# Patient Record
Sex: Female | Born: 1935 | Race: Black or African American | Hispanic: No | Marital: Married | Smoking: Never smoker
Health system: Southern US, Community
[De-identification: ages and names within clinical notes are randomized; demographics above are authoritative.]

## PROBLEM LIST (undated history)

## (undated) DIAGNOSIS — I10 Essential (primary) hypertension: Secondary | ICD-10-CM

## (undated) DIAGNOSIS — I429 Cardiomyopathy, unspecified: Secondary | ICD-10-CM

## (undated) DIAGNOSIS — N184 Chronic kidney disease, stage 4 (severe): Secondary | ICD-10-CM

## (undated) DIAGNOSIS — D509 Iron deficiency anemia, unspecified: Secondary | ICD-10-CM

## (undated) DIAGNOSIS — R55 Syncope and collapse: Secondary | ICD-10-CM

## (undated) DIAGNOSIS — I509 Heart failure, unspecified: Secondary | ICD-10-CM

## (undated) DIAGNOSIS — D649 Anemia, unspecified: Secondary | ICD-10-CM

## (undated) DIAGNOSIS — I471 Supraventricular tachycardia, unspecified: Secondary | ICD-10-CM

## (undated) DIAGNOSIS — R5381 Other malaise: Secondary | ICD-10-CM

## (undated) DIAGNOSIS — I639 Cerebral infarction, unspecified: Secondary | ICD-10-CM

## (undated) DIAGNOSIS — I2510 Atherosclerotic heart disease of native coronary artery without angina pectoris: Secondary | ICD-10-CM

## (undated) DIAGNOSIS — E440 Moderate protein-calorie malnutrition: Secondary | ICD-10-CM

## (undated) HISTORY — DX: Syncope and collapse: R55

## (undated) HISTORY — PX: KNEE SURGERY: SHX244

## (undated) NOTE — *Deleted (*Deleted)
***In Progress*** PCP: Dr. Darleene Cleaver Primary Cardiologist: Dr Shirlee Latch  HPI:  Julie Stein is a 46 y.o.  female with a history of SVT, HTN, CAD, mixed ischemic/nonischemic cardiomyopathy, CHF and stroke. She was admitted initially 08/23/15 with pulmonary edema. Echo on 08/26/15 revealed an EF of 20-25% with diffuse hypokinesis. It was thought she had a viral myocarditis or hypertensive cardiomyopathy. She developed transient weakness and was noted to have CVA by MRI.  She had R/LHC on 10/10 revealing normal to low filling pressures and normal cardiac output, moderate to severe ostial OM1 and severe distal LAD stenoses with plan for medical management. We thought her flash pulmonary edema and troponin elevation was likely from hypertensive crisis leading to demand ischemia. During this time, she was noted to have runs of symptomatic SVT.   Once stable, she was admitted to IP rehab, but then readmitted to the hospital with syncope while on bedside commode requiring CPR.  She had recurrent SVT noted when back on telemetry.  Syncope thought to be 2/2 rapid SVT versus vasovagal.  She had an EP study but SVT could not be triggered so no ablation was done.  Given further recurrences, she was started on amiodarone.  Amiodarone was subsequently stopped.   Echo 1/17 showed EF improved to 60-65%.    She had a significant rise in LFTs in 5/17, apparently had a "bad cold" around this time. LFTs were normal by 7/17.  At office appointment in 2/18, she was doing poorly.  She had lost considerable weight overall. She had been intermittently confused.  She had progressive anemia and was followed by hematology who had been concerned for anemia of renal disease.  Had profound fatigue and was not very active.  She developed difficulty swallowing and pills had to be crushed.  She had to use a walker. Questionable if depression was cause of symptoms.    Later in 2/18, she was admitted with syncope.  Workup was negative.   She was started on Keppra for possible subclinical seizures.  She was admitted again in 3/18 with syncope and was found to be orthostatic.  Echo in 2/18 showed EF 60-65%.   Recently returned for followup of CHF and HTN with Dr. Shirlee Latch on 09/18/20. She was doing well symptomatically. No significant exertional dyspnea or chest pain.  Completed housework and gardens with no difficulty.  Weight had been stable.  BP was high, generally in the 140s systolic when she checks at home. No orthopnea/PND.        ECHO 09/25/20 showed LVEF 55-60% with normal RV function.  Today she returns to HF clinic for pharmacist medication titration. At last visit with MD losartan 25 mg daily was started.   150/80, 75, 101 lbs Here for HTN, should bring in BP log; Medicare A only? *Need BMET w/ losartan (Scr 1.1, K 3.7 last time) Plan A: incr losartan 50 Plan B: incr spiro 50 F/u: 4-week Pharm (DM in 6 months)  Overall feeling ***. Dizziness, lightheadedness, fatigue:  Chest pain or palpitations:  How is your breathing?: *** SOB: Able to complete all ADLs. Activity level ***  Weight at home pounds. Takes furosemide/torsemide/bumex *** mg *** daily.  LEE PND/Orthopnea  Appetite *** Low-salt diet:   Physical Exam Cost/affordability of meds   HF Medications: Carvedilol 6.25 mg BID Losartan 25 mg daily Spironolactone 25 mg daily  Has the patient been experiencing any side effects to the medications prescribed?  {YES NO:22349}  Does the patient have any problems obtaining medications  due to transportation or finances?   {YES NO:22349}  Understanding of regimen: {excellent/good/fair/poor:19665} Understanding of indications: {excellent/good/fair/poor:19665} Potential of compliance: {excellent/good/fair/poor:19665} Patient understands to avoid NSAIDs. Patient understands to avoid decongestants.    Pertinent Lab Values: . Serum creatinine ***, BUN ***, Potassium ***, Sodium ***, BNP ***, Magnesium  ***, Digoxin ***   Vital Signs: . Weight: *** (last clinic weight: 101 lbs) . Blood pressure: ***  . Heart rate: ***   Assessment: 1. Chronic Systolic HF => recovered: Mixed ischemic/nonischemic CMP, suspect long-standing HTN played a large role. ECHO 10/16 EF 20-25% with mild LVH. Repeat echo (1/17) showed EF improved back to normal range, 60-65%.  Echo in 2/18 showed that EF remained normal, 60-65%. She is euvolemic today, NYHA class II.   - Continue carvedilol 6.25 mg BID - Continue losartan 25 mg daily - Continue spironolactone 25 mg daily  2. CAD: LHC 08/27/15 with 20% proximal LAD, 40% mid LAD, 90% distal LAD, large high OM1 with 80% ostial stenosis, medically managed. LDL 79 on 09/18/20. No chest pain.  - Continue aspirin 81 mg daily - Continue rosuvastatin 10 mg daily    3. CVA: MRI head 08/28/15 showed small infarcts in watershed region.Suspect related to HTN, neurologic symptoms preceded cath.  No residual deficit.  4. H/o SVT with syncope: No symptomatic recurrence. Unable to reproduce with EP study.  - She is now off amiodarone.    5. HTN:  Renal artery dopplers 08/30/15 did not show RAS. BP elevated. - Continue carvedilol 6.25 mg BID - Continue losartan 25 mg daily - Continue spironolactone 25 mg daily   Plan: 1) Medication changes: Based on clinical presentation, vital signs and recent labs will *** 2) Labs: *** 3) Follow-up: ***   Karle Plumber, PharmD, BCPS, BCCP, CPP [facility] Pharmacist [phone]

---

## 2015-08-26 ENCOUNTER — Inpatient Hospital Stay (HOSPITAL_COMMUNITY): Payer: Medicare Other

## 2015-08-26 ENCOUNTER — Emergency Department (HOSPITAL_BASED_OUTPATIENT_CLINIC_OR_DEPARTMENT_OTHER): Payer: Medicare Other

## 2015-08-26 ENCOUNTER — Encounter (HOSPITAL_COMMUNITY): Payer: Self-pay

## 2015-08-26 ENCOUNTER — Emergency Department (HOSPITAL_COMMUNITY): Payer: Medicare Other

## 2015-08-26 ENCOUNTER — Inpatient Hospital Stay (HOSPITAL_COMMUNITY)
Admission: EM | Admit: 2015-08-26 | Discharge: 2015-08-29 | DRG: 286 | Payer: Medicare Other | Attending: Cardiology | Admitting: Cardiology

## 2015-08-26 DIAGNOSIS — N189 Chronic kidney disease, unspecified: Secondary | ICD-10-CM | POA: Diagnosis present

## 2015-08-26 DIAGNOSIS — D649 Anemia, unspecified: Secondary | ICD-10-CM | POA: Diagnosis not present

## 2015-08-26 DIAGNOSIS — Z452 Encounter for adjustment and management of vascular access device: Secondary | ICD-10-CM | POA: Insufficient documentation

## 2015-08-26 DIAGNOSIS — J9601 Acute respiratory failure with hypoxia: Secondary | ICD-10-CM

## 2015-08-26 DIAGNOSIS — B349 Viral infection, unspecified: Secondary | ICD-10-CM | POA: Diagnosis present

## 2015-08-26 DIAGNOSIS — I509 Heart failure, unspecified: Secondary | ICD-10-CM | POA: Insufficient documentation

## 2015-08-26 DIAGNOSIS — I130 Hypertensive heart and chronic kidney disease with heart failure and stage 1 through stage 4 chronic kidney disease, or unspecified chronic kidney disease: Principal | ICD-10-CM | POA: Diagnosis present

## 2015-08-26 DIAGNOSIS — J9602 Acute respiratory failure with hypercapnia: Secondary | ICD-10-CM

## 2015-08-26 DIAGNOSIS — I5023 Acute on chronic systolic (congestive) heart failure: Secondary | ICD-10-CM | POA: Diagnosis present

## 2015-08-26 DIAGNOSIS — E872 Acidosis: Secondary | ICD-10-CM | POA: Diagnosis present

## 2015-08-26 DIAGNOSIS — I493 Ventricular premature depolarization: Secondary | ICD-10-CM | POA: Diagnosis present

## 2015-08-26 DIAGNOSIS — Z7982 Long term (current) use of aspirin: Secondary | ICD-10-CM | POA: Diagnosis not present

## 2015-08-26 DIAGNOSIS — E785 Hyperlipidemia, unspecified: Secondary | ICD-10-CM | POA: Diagnosis present

## 2015-08-26 DIAGNOSIS — I959 Hypotension, unspecified: Secondary | ICD-10-CM | POA: Diagnosis not present

## 2015-08-26 DIAGNOSIS — I429 Cardiomyopathy, unspecified: Secondary | ICD-10-CM | POA: Diagnosis present

## 2015-08-26 DIAGNOSIS — J9600 Acute respiratory failure, unspecified whether with hypoxia or hypercapnia: Secondary | ICD-10-CM | POA: Diagnosis not present

## 2015-08-26 DIAGNOSIS — G459 Transient cerebral ischemic attack, unspecified: Secondary | ICD-10-CM | POA: Diagnosis not present

## 2015-08-26 DIAGNOSIS — I471 Supraventricular tachycardia, unspecified: Secondary | ICD-10-CM | POA: Insufficient documentation

## 2015-08-26 DIAGNOSIS — R531 Weakness: Secondary | ICD-10-CM

## 2015-08-26 DIAGNOSIS — I69354 Hemiplegia and hemiparesis following cerebral infarction affecting left non-dominant side: Secondary | ICD-10-CM | POA: Diagnosis not present

## 2015-08-26 DIAGNOSIS — R202 Paresthesia of skin: Secondary | ICD-10-CM | POA: Diagnosis not present

## 2015-08-26 DIAGNOSIS — J811 Chronic pulmonary edema: Secondary | ICD-10-CM

## 2015-08-26 DIAGNOSIS — N179 Acute kidney failure, unspecified: Secondary | ICD-10-CM | POA: Diagnosis present

## 2015-08-26 DIAGNOSIS — G8194 Hemiplegia, unspecified affecting left nondominant side: Secondary | ICD-10-CM | POA: Diagnosis not present

## 2015-08-26 DIAGNOSIS — I2510 Atherosclerotic heart disease of native coronary artery without angina pectoris: Secondary | ICD-10-CM | POA: Diagnosis present

## 2015-08-26 DIAGNOSIS — J810 Acute pulmonary edema: Secondary | ICD-10-CM

## 2015-08-26 DIAGNOSIS — R739 Hyperglycemia, unspecified: Secondary | ICD-10-CM | POA: Diagnosis present

## 2015-08-26 DIAGNOSIS — R0600 Dyspnea, unspecified: Secondary | ICD-10-CM

## 2015-08-26 DIAGNOSIS — R0602 Shortness of breath: Secondary | ICD-10-CM | POA: Diagnosis present

## 2015-08-26 DIAGNOSIS — I639 Cerebral infarction, unspecified: Secondary | ICD-10-CM

## 2015-08-26 DIAGNOSIS — I10 Essential (primary) hypertension: Secondary | ICD-10-CM

## 2015-08-26 DIAGNOSIS — I5021 Acute systolic (congestive) heart failure: Secondary | ICD-10-CM

## 2015-08-26 DIAGNOSIS — I161 Hypertensive emergency: Secondary | ICD-10-CM

## 2015-08-26 DIAGNOSIS — J9691 Respiratory failure, unspecified with hypoxia: Secondary | ICD-10-CM | POA: Diagnosis present

## 2015-08-26 DIAGNOSIS — I248 Other forms of acute ischemic heart disease: Secondary | ICD-10-CM | POA: Diagnosis present

## 2015-08-26 HISTORY — DX: Essential (primary) hypertension: I10

## 2015-08-26 LAB — I-STAT ARTERIAL BLOOD GAS, ED
Acid-base deficit: 6 mmol/L — ABNORMAL HIGH (ref 0.0–2.0)
Acid-base deficit: 4 mmol/L — ABNORMAL HIGH (ref 0.0–2.0)
BICARBONATE: 22.7 meq/L (ref 20.0–24.0)
Bicarbonate: 24.6 mEq/L — ABNORMAL HIGH (ref 20.0–24.0)
O2 SAT: 90 %
O2 Saturation: 99 %
PCO2 ART: 71.7 mmHg — AB (ref 35.0–45.0)
PCO2 ART: 46.2 mmHg — AB (ref 35.0–45.0)
PO2 ART: 79 mmHg — AB (ref 80.0–100.0)
TCO2: 27 mmol/L (ref 0–100)
TCO2: 24 mmol/L (ref 0–100)
pH, Arterial: 7.144 — CL (ref 7.350–7.450)
pH, Arterial: 7.299 — ABNORMAL LOW (ref 7.350–7.450)
pO2, Arterial: 156 mmHg — ABNORMAL HIGH (ref 80.0–100.0)

## 2015-08-26 LAB — I-STAT CHEM 8, ED
BUN: 21 mg/dL — AB (ref 6–20)
Calcium, Ion: 1.19 mmol/L (ref 1.13–1.30)
Chloride: 104 mmol/L (ref 101–111)
Creatinine, Ser: 1.1 mg/dL — ABNORMAL HIGH (ref 0.44–1.00)
Glucose, Bld: 371 mg/dL — ABNORMAL HIGH (ref 65–99)
HCT: 46 % (ref 36.0–46.0)
Hemoglobin: 15.6 g/dL — ABNORMAL HIGH (ref 12.0–15.0)
POTASSIUM: 3.5 mmol/L (ref 3.5–5.1)
Sodium: 139 mmol/L (ref 135–145)
TCO2: 23 mmol/L (ref 0–100)

## 2015-08-26 LAB — URINE MICROSCOPIC-ADD ON

## 2015-08-26 LAB — COMPREHENSIVE METABOLIC PANEL
ALBUMIN: 3.3 g/dL — AB (ref 3.5–5.0)
ALT: 21 U/L (ref 14–54)
ALT: 30 U/L (ref 14–54)
AST: 42 U/L — AB (ref 15–41)
AST: 59 U/L — ABNORMAL HIGH (ref 15–41)
Albumin: 3.5 g/dL (ref 3.5–5.0)
Alkaline Phosphatase: 100 U/L (ref 38–126)
Alkaline Phosphatase: 94 U/L (ref 38–126)
Anion gap: 12 (ref 5–15)
Anion gap: 14 (ref 5–15)
BILIRUBIN TOTAL: 0.2 mg/dL — AB (ref 0.3–1.2)
BUN: 16 mg/dL (ref 6–20)
BUN: 19 mg/dL (ref 6–20)
CHLORIDE: 100 mmol/L — AB (ref 101–111)
CO2: 23 mmol/L (ref 22–32)
CO2: 25 mmol/L (ref 22–32)
Calcium: 9.2 mg/dL (ref 8.9–10.3)
Calcium: 9 mg/dL (ref 8.9–10.3)
Chloride: 95 mmol/L — ABNORMAL LOW (ref 101–111)
Creatinine, Ser: 1.14 mg/dL — ABNORMAL HIGH (ref 0.44–1.00)
Creatinine, Ser: 1.11 mg/dL — ABNORMAL HIGH (ref 0.44–1.00)
GFR calc Af Amer: 52 mL/min — ABNORMAL LOW (ref >60)
GFR calc Af Amer: 54 mL/min — ABNORMAL LOW (ref >60)
GFR calc non Af Amer: 45 mL/min — ABNORMAL LOW (ref >60)
GFR calc non Af Amer: 46 mL/min — ABNORMAL LOW (ref >60)
GLUCOSE: 366 mg/dL — AB (ref 65–99)
Glucose, Bld: 196 mg/dL — ABNORMAL HIGH (ref 65–99)
POTASSIUM: 3.5 mmol/L (ref 3.5–5.1)
Potassium: 3.6 mmol/L (ref 3.5–5.1)
SODIUM: 135 mmol/L (ref 135–145)
Sodium: 134 mmol/L — ABNORMAL LOW (ref 135–145)
TOTAL PROTEIN: 7.6 g/dL (ref 6.5–8.1)
Total Bilirubin: 0.3 mg/dL (ref 0.3–1.2)
Total Protein: 7.5 g/dL (ref 6.5–8.1)

## 2015-08-26 LAB — CBC
HCT: 38.9 % (ref 36.0–46.0)
Hemoglobin: 12.4 g/dL (ref 12.0–15.0)
MCH: 23.4 pg — ABNORMAL LOW (ref 26.0–34.0)
MCHC: 31.9 g/dL (ref 30.0–36.0)
MCV: 73.5 fL — ABNORMAL LOW (ref 78.0–100.0)
Platelets: 228 10*3/uL (ref 150–400)
RBC: 5.29 MIL/uL — ABNORMAL HIGH (ref 3.87–5.11)
RDW: 15.9 % — ABNORMAL HIGH (ref 11.5–15.5)
WBC: 17.3 10*3/uL — ABNORMAL HIGH (ref 4.0–10.5)

## 2015-08-26 LAB — CBC WITH DIFFERENTIAL/PLATELET
Basophils Absolute: 0 10*3/uL (ref 0.0–0.1)
Basophils Relative: 0 %
EOS PCT: 1 %
Eosinophils Absolute: 0.1 10*3/uL (ref 0.0–0.7)
HEMATOCRIT: 40.4 % (ref 36.0–46.0)
Hemoglobin: 13 g/dL (ref 12.0–15.0)
LYMPHS ABS: 2.9 10*3/uL (ref 0.7–4.0)
LYMPHS PCT: 26 %
MCH: 23.6 pg — AB (ref 26.0–34.0)
MCHC: 32.2 g/dL (ref 30.0–36.0)
MCV: 73.2 fL — AB (ref 78.0–100.0)
MONO ABS: 0.3 10*3/uL (ref 0.1–1.0)
MONOS PCT: 3 %
NEUTROS ABS: 7.7 10*3/uL (ref 1.7–7.7)
Neutrophils Relative %: 69 %
PLATELETS: 291 10*3/uL (ref 150–400)
RBC: 5.52 MIL/uL — ABNORMAL HIGH (ref 3.87–5.11)
RDW: 16 % — AB (ref 11.5–15.5)
WBC: 11.1 10*3/uL — ABNORMAL HIGH (ref 4.0–10.5)

## 2015-08-26 LAB — TROPONIN I
TROPONIN I: 0.27 ng/mL — AB (ref <0.031)
Troponin I: 0.09 ng/mL — ABNORMAL HIGH (ref <0.031)
Troponin I: 0.31 ng/mL — ABNORMAL HIGH (ref <0.031)

## 2015-08-26 LAB — MAGNESIUM: Magnesium: 1.8 mg/dL (ref 1.7–2.4)

## 2015-08-26 LAB — PHOSPHORUS: Phosphorus: 3.9 mg/dL (ref 2.5–4.6)

## 2015-08-26 LAB — URINALYSIS, ROUTINE W REFLEX MICROSCOPIC
Bilirubin Urine: NEGATIVE
KETONES UR: NEGATIVE mg/dL
LEUKOCYTES UA: NEGATIVE
NITRITE: NEGATIVE
PH: 6 (ref 5.0–8.0)
PROTEIN: 100 mg/dL — AB
Specific Gravity, Urine: >1.03 — ABNORMAL HIGH (ref 1.005–1.030)
Urobilinogen, UA: 0.2 mg/dL (ref 0.0–1.0)

## 2015-08-26 LAB — BRAIN NATRIURETIC PEPTIDE: B Natriuretic Peptide: 451.8 pg/mL — ABNORMAL HIGH (ref 0.0–100.0)

## 2015-08-26 MED ORDER — NITROGLYCERIN IN D5W 200-5 MCG/ML-% IV SOLN
INTRAVENOUS | Status: AC
  Filled 2015-08-26: qty 250

## 2015-08-26 MED ORDER — FUROSEMIDE 10 MG/ML IJ SOLN
80.0000 mg | Freq: Once | INTRAMUSCULAR | Status: AC
  Administered 2015-08-26: 80 mg via INTRAVENOUS
  Filled 2015-08-26: qty 8

## 2015-08-26 MED ORDER — AMIODARONE HCL IN DEXTROSE 360-4.14 MG/200ML-% IV SOLN
30.0000 mg/h | INTRAVENOUS | Status: DC
  Administered 2015-08-26: 30 mg/h via INTRAVENOUS
  Filled 2015-08-26 (×2): qty 200

## 2015-08-26 MED ORDER — SODIUM CHLORIDE 0.9 % IV SOLN: INTRAVENOUS | Status: DC | PRN

## 2015-08-26 MED ORDER — ASPIRIN 81 MG PO CHEW: 324.0000 mg | CHEWABLE_TABLET | Freq: Once | ORAL | Status: DC

## 2015-08-26 MED ORDER — AMIODARONE HCL IN DEXTROSE 360-4.14 MG/200ML-% IV SOLN
60.0000 mg/h | INTRAVENOUS | Status: DC
  Administered 2015-08-26 (×2): 60 mg/h via INTRAVENOUS
  Filled 2015-08-26: qty 200

## 2015-08-26 MED ORDER — HEPARIN (PORCINE) IN NACL 100-0.45 UNIT/ML-% IJ SOLN
500.0000 [IU]/h | INTRAMUSCULAR | Status: DC
  Administered 2015-08-27: 500 [IU]/h via INTRAVENOUS
  Filled 2015-08-26: qty 250

## 2015-08-26 MED ORDER — HEPARIN BOLUS VIA INFUSION
3000.0000 [IU] | Freq: Once | INTRAVENOUS | Status: DC
  Filled 2015-08-26: qty 3000

## 2015-08-26 MED ORDER — DEXTROSE 5 % IV SOLN
1.0000 g | Freq: Once | INTRAVENOUS | Status: AC
  Administered 2015-08-26: 1 g via INTRAVENOUS
  Filled 2015-08-26: qty 10

## 2015-08-26 MED ORDER — DEXTROSE 5 % IV SOLN
250.0000 mg | Freq: Once | INTRAVENOUS | Status: AC
  Administered 2015-08-26: 250 mg via INTRAVENOUS
  Filled 2015-08-26: qty 250

## 2015-08-26 MED ORDER — NITROPRUSSIDE SODIUM 25 MG/ML IV SOLN
0.0000 ug/kg/min | INTRAVENOUS | Status: DC
  Administered 2015-08-26: 0.3 ug/kg/min via INTRAVENOUS
  Filled 2015-08-26: qty 2

## 2015-08-26 MED ORDER — AMIODARONE LOAD VIA INFUSION
150.0000 mg | Freq: Once | INTRAVENOUS | Status: AC
Start: 2015-08-26 — End: 2015-08-26
  Administered 2015-08-26: 150 mg via INTRAVENOUS
  Filled 2015-08-26: qty 83.34

## 2015-08-26 MED ORDER — ASPIRIN EC 81 MG PO TBEC
81.0000 mg | DELAYED_RELEASE_TABLET | Freq: Every day | ORAL | Status: DC
  Administered 2015-08-27 – 2015-08-28 (×2): 81 mg via ORAL
  Filled 2015-08-26 (×2): qty 1

## 2015-08-26 MED ORDER — INSULIN ASPART 100 UNIT/ML ~~LOC~~ SOLN
0.0000 [IU] | SUBCUTANEOUS | Status: DC
  Administered 2015-08-26: 3 [IU] via SUBCUTANEOUS
  Administered 2015-08-27: 2 [IU] via SUBCUTANEOUS
  Administered 2015-08-27: 1 [IU] via SUBCUTANEOUS
  Administered 2015-08-28 – 2015-08-29 (×5): 2 [IU] via SUBCUTANEOUS

## 2015-08-26 MED ORDER — SODIUM CHLORIDE 0.9 % IJ SOLN
3.0000 mL | Freq: Two times a day (BID) | INTRAMUSCULAR | Status: DC
  Administered 2015-08-26 – 2015-08-27 (×4): 3 mL via INTRAVENOUS
  Administered 2015-08-28: 10:00:00 via INTRAVENOUS
  Administered 2015-08-28 – 2015-08-29 (×2): 3 mL via INTRAVENOUS

## 2015-08-26 MED ORDER — NITROGLYCERIN IN D5W 200-5 MCG/ML-% IV SOLN
0.0000 ug/min | Freq: Once | INTRAVENOUS | Status: AC
  Administered 2015-08-26: 10 ug/min via INTRAVENOUS

## 2015-08-26 MED ORDER — FUROSEMIDE 10 MG/ML IJ SOLN
40.0000 mg | Freq: Four times a day (QID) | INTRAMUSCULAR | Status: DC
  Administered 2015-08-26 – 2015-08-27 (×2): 40 mg via INTRAVENOUS
  Filled 2015-08-26 (×2): qty 4

## 2015-08-26 NOTE — ED Notes (Addendum)
Heparin drip ordered per Dr. Clayborne Dana. Start at 10 mcg/min, increase by 25 mcg/min q 5 min.

## 2015-08-26 NOTE — Consult Note (Signed)
PULMONARY / CRITICAL CARE MEDICINE   Name: Julie Stein MRN: [id number] DOB: [birth year]    ADMISSION DATE:  08/26/2015 CONSULTATION DATE:  10/9  REFERRING MD :  Delton See (Cardiology)   CHIEF COMPLAINT:  Respiratory failure   INITIAL PRESENTATION: 80yo female never smoker with hx HTN who presented 10/9 with sudden onset SOB after 1 week of viral s/s including cough, congestion, diarrhea.  In ER CXR revealed significant pulmonary edema and bedside echo showed EF 20-25% with diffuse hypokinesis.  Pt placed on bipap in ER and admitted to ICU per cards, PCCM consulted.   STUDIES:  2D echo 10/9>>> severely reduced systolic function with EF 20-25%, diffuse hypokinesis, grade 1 diastolic dysfunction, small organized pericardial effusion  SIGNIFICANT EVENTS:    HISTORY OF PRESENT ILLNESS:  80yo female with hx HTN who presented 10/9 with sudden onset SOB after 1 week of viral s/s including cough, congestion, diarrhea.  In ER CXR revealed significant pulmonary edema and bedside echo showed EF 20-25% with diffuse hypokinesis.  Pt placed on bipap in ER and admitted to ICU per cards, PCCM consulted.   Currently feeling much better on bipap.  Still mildly tachypneic but resting, answers questions appropriately.  Per pt she had approx 1 week of viral illness including cough, congestion, malaise and 1 episode of chills but no documented fever.  Denies having any chest pain until today during her acute SOB.  Denies any associated diaphoresis during this "episode".  Currently denies chest pain, BLE edema, orthopnea, hemoptysis.    PAST MEDICAL HISTORY :   has a past medical history of Hypertension.  has no past surgical history on file. Prior to Admission medications   Not on File   No Known Allergies  FAMILY HISTORY:  has no family status information on file.  SOCIAL HISTORY:  reports that she has never smoked. She does not have any smokeless tobacco history on file. She reports that she does not drink  alcohol or use illicit drugs.  REVIEW OF SYSTEMS:  As per HPI - All other systems reviewed and were neg.    SUBJECTIVE:   VITAL SIGNS: Temp:  [97.5 F (36.4 C)-99.7 F (37.6 C)] 97.5 F (36.4 C) (10/09 1900) Pulse Rate:  [42-200] 99 (10/09 1730) Resp:  [18-39] 29 (10/09 1730) BP: (103-209)/(72-127) 146/97 mmHg (10/09 1730) SpO2:  [85 %-100 %] 100 % (10/09 1730) FiO2 (%):  [100 %] 100 % (10/09 1655) Weight:  [109 lb 5.6 oz (49.6 kg)] 109 lb 5.6 oz (49.6 kg) (10/09 1826) HEMODYNAMICS:   VENTILATOR SETTINGS: Vent Mode:  [-]  FiO2 (%):  [100 %] 100 % INTAKE / OUTPUT:  Intake/Output Summary (Last 24 hours) at 08/26/15 2008 Last data filed at 08/26/15 1839  Gross per 24 hour  Intake    255 ml  Output    300 ml  Net    -45 ml    PHYSICAL EXAMINATION: General:  Thin female, NAD on bipap  Neuro:  Awake, alert, appropriate, MAE  HEENT:  Mm dry on bipap Cardiovascular:  s1s2 NSR frequent PAC's  Lungs:  resps even, non labored, tachypneic on bipap, bilat crackles  Abdomen:  Soft, non tender, +bs  Musculoskeletal:  Warm and dry, scant BLE edema   LABS:  CBC  Recent Labs Lab 08/26/15 1306 08/26/15 1328  WBC 11.1*  --   HGB 13.0 15.6*  HCT 40.4 46.0  PLT 291  --    Coag's No results for input(s): APTT, INR in the last 168 hours.  BMET  Recent Labs Lab 08/26/15 1306 08/26/15 1328  NA 135 139  K 3.5 3.5  CL 100* 104  CO2 23  --   BUN 16 21*  CREATININE 1.14* 1.10*  GLUCOSE 366* 371*   Electrolytes  Recent Labs Lab 08/26/15 1306  CALCIUM 9.2   Sepsis Markers No results for input(s): LATICACIDVEN, PROCALCITON, O2SATVEN in the last 168 hours. ABG  Recent Labs Lab 08/26/15 1325 08/26/15 1442  PHART 7.144* 7.299*  PCO2ART 71.7* 46.2*  PO2ART 79.0* 156.0*   Liver Enzymes  Recent Labs Lab 08/26/15 1306  AST 42*  ALT 21  ALKPHOS 100  BILITOT 0.2*  ALBUMIN 3.3*   Cardiac Enzymes  Recent Labs Lab 08/26/15 1306 08/26/15 1850  TROPONINI  0.09* 0.27*   Glucose No results for input(s): GLUCAP in the last 168 hours.  Imaging Dg Chest Portable 1 View  08/26/2015   CLINICAL DATA:  Shortness of breath.  EXAM: PORTABLE CHEST 1 VIEW  COMPARISON:  None.  FINDINGS: Borderline cardiomegaly is noted. No pneumothorax or significant pleural effusion is noted. Diffuse patchy airspace opacities are noted throughout both lungs concerning for pneumonia or edema. Bony thorax is unremarkable.  IMPRESSION: Diffuse patchy bilateral airspace opacities are noted concerning for pneumonia or edema.   Electronically Signed   By: Lupita Raider, M.D.   On: 08/26/2015 13:24     ASSESSMENT / PLAN:  PULMONARY Acute respiratory failure - r/t pulmonary edema in setting acute systolic heart failure  Respiratory acidosis  P:   Continue bipap for now Titrate FiO2 to keep sats >92% F/u ABG  F/u CXR  Diuresis as below    CARDIOVASCULAR CVL  Acute systolic heart failure - unclear etiology.  ?? Viral myocarditis v decompensated Hypertensive cardiomyopathy v ischemic cardiomyopathy (less likely) HTN urgency  Pulmonary edema  P:  Diuresis per cards  Nitro gtt, amio gtt per cards  Trend troponin  F/u lactate  F/u BNP Place CVL minimally for IV access and CVP.  Cards consider PA cath    RENAL AKI - mild v CKD (no previous labs)  P:   F/u chem    GASTROINTESTINAL No active issue  P:   NPO while on bipap   HEMATOLOGIC No active issue  P:  F/u CBC   INFECTIOUS Suspect viral illness  Leukocytosis  P:   U/a  10/9>>>  Rocephin 10/9 x1  Azithro 10/9 x1  Monitor wbc, fever curve off abx    ENDOCRINE Hyperglycemia - no documented hx DM   P:   SSI  HgbA1c  Check TSH   NEUROLOGIC No active issue  P:   Monitor mental status with hypercarbia    FAMILY  - Updates:  Pt updated at length 10/9    Dirk Dress, NP 08/26/2015  8:08 PM Pager: [phone] or [phone]

## 2015-08-26 NOTE — Progress Notes (Addendum)
ANTICOAGULATION CONSULT NOTE - Initial Consult  Pharmacy Consult for Heparin Indication: chest pain/ACS  No Known Allergies  Patient Measurements: Height: 4\' 11"  (149.9 cm) Weight: 109 lb 5.6 oz (49.6 kg) IBW/kg (Calculated) : 43.2  Vital Signs: Temp: 97.5 F (36.4 C) (10/09 1900) Temp Source: Axillary (10/09 1900) BP: 144/112 mmHg (10/09 2130) Pulse Rate: 54 (10/09 2130)  Labs:  Recent Labs  08/26/15 1306 08/26/15 1328 08/26/15 1850 08/26/15 1950  HGB 13.0 15.6*  --  12.4  HCT 40.4 46.0  --  38.9  PLT 291  --   --  228  CREATININE 1.14* 1.10*  --  1.11*  TROPONINI 0.09*  --  0.27* 0.31*    Estimated Creatinine Clearance: 28.5 mL/min (by C-G formula based on Cr of 1.11).   Medical History: Past Medical History  Diagnosis Date  . Hypertension     Medications:  No prescriptions prior to admission    Assessment: 56 YOF who presented with sudden onset of SOB. Repeat troponins are elevated. Pharmacy consulted to start IV heparin for ACS. H/H and Plt wnl. Likely cath tomorrow. Per RN, patient is not on any blood thinners at home.   Goal of Therapy:  Heparin level 0.3-0.7 units/ml Monitor platelets by anticoagulation protocol: Yes   Plan:  -Give heparin 3000 units IV bolus followed heparin infusion at 500 units/hr -F/u 8 hr HL -Monitor daily HL, CBC and s/s of bleeding   Vinnie Level, PharmD., BCPS Clinical Pharmacist Pager [phone]   ADDENDUM: Pt went unresponsive overnight then had left-sided deficits; neuro consulted, asked to hold heparin until CT results, CT shows no acute process.  Spoke w/ RN Marchelle Folks who will hold bolus 2/2 possible CVA, okay to start at rate previously ordered if approved by neuro but will lower goal to 0.3-0.5 until CVA ruled out. Vernard Gambles, PharmD, BCPS 08/27/2015 2:54 AM

## 2015-08-26 NOTE — Progress Notes (Signed)
  Echocardiogram 2D Echocardiogram has been performed.  Delcie Roch 08/26/2015, 4:50 PM

## 2015-08-26 NOTE — ED Notes (Signed)
Pt called nurse to room, reports since being on 100% non rebreather, chest pain has returned and her breathing is more labored.

## 2015-08-26 NOTE — ED Notes (Signed)
Echo tech done.

## 2015-08-26 NOTE — Procedures (Signed)
Central Venous Catheter Insertion Procedure Note Julie Stein [id number] 01/27/1935  Procedure: Insertion of Central Venous Catheter Indications: Assessment of intravascular volume and Drug and/or fluid administration  Procedure Details Consent: Risks of procedure as well as the alternatives and risks of each were explained to the (patient/caregiver).  Consent for procedure obtained. Time Out: Verified patient identification, verified procedure, site/side was marked, verified correct patient position, special equipment/implants available, medications/allergies/relevent history reviewed, required imaging and test results available.  Performed  Maximum sterile technique was used including antiseptics, cap, gloves, gown, hand hygiene, mask and sheet. Skin prep: Chlorhexidine; local anesthetic administered A antimicrobial bonded/coated triple lumen catheter was placed in the left internal jugular vein using the Seldinger technique.  Evaluation Blood flow good Complications: No apparent complications Patient did tolerate procedure well. Chest X-ray ordered to verify placement.  CXR: pending.   Performed using ultrasound guidance.  Wire visualized in vessel under ultrasound.   Dirk Dress, NP 08/26/2015  10:54 PM

## 2015-08-26 NOTE — H&P (Addendum)
CARDIOLOGY CONSULT NOTE   Patient ID: Julie Stein MRN: [id number], DOB/AGE: 05-27-1935   Admit date: 08/26/2015 Date of Consult: 08/26/2015  Primary Physician: No PCP Per Patient Primary Cardiologist: New patient  Reason for consult:  SOB, tachycardia, CHF  Problem List  Past Medical History  Diagnosis Date  . Hypertension     History reviewed. No pertinent past surgical history.   Allergies  No Known Allergies  HPI   A pleasant 80 year old female with h/o hypertension who presented with sudden onset of shortness of breath this am. The patient is on BiPAP mask during the interview but nodes and the questions are answered by her daughter and other family members. The patient was experiencing viral disease in the last week with mild diarrhea, congestion, and cough. She woke up this morning and was doing house chores when all sudden developed cough that progressed to severe shortness of breath. She presented to the ER and was started on BiPAP for acute respiratory failure, ABGs shows respiratory acidosis with hypercapnia.  CXR shows significant pulmonary edema.  She had mild chest pain during the episode. While in the ER she was running frequent PVCs, bigeminy, PACs and 2 runs of SVT with HR up to 200 BPM. She became hypotensive during these episodes and was started on amiodarone drip.   She appears to be in respiratory distress while on BiPAP. Bedside echo shows LVEF 20-25%, full echo is pending, RV is functioning well.   Inpatient Medications  . furosemide  40 mg Intravenous Q6H  . furosemide  80 mg Intravenous Once  . nitroGLYCERIN        Family History History reviewed. No pertinent family history.   Social History Social History   Social History  . Marital Status: Married    Spouse Name: N/A  . Number of Children: N/A  . Years of Education: N/A   Occupational History  . Not on file.   Social History Main Topics  . Smoking status: Never Smoker   . Smokeless  tobacco: Not on file  . Alcohol Use: No  . Drug Use: No  . Sexual Activity: Not on file   Other Topics Concern  . Not on file   Social History Narrative  . No narrative on file     Review of Systems  General:  No chills, fever, night sweats or weight changes.  Cardiovascular:  No chest pain, dyspnea on exertion, edema, orthopnea, palpitations, paroxysmal nocturnal dyspnea. Dermatological: No rash, lesions/masses Respiratory: No cough, dyspnea Urologic: No hematuria, dysuria Abdominal:   No nausea, vomiting, diarrhea, bright red blood per rectum, melena, or hematemesis Neurologic:  No visual changes, wkns, changes in mental status. All other systems reviewed and are otherwise negative except as noted above.  Physical Exam  Blood pressure 139/89, pulse 96, temperature 99.7 F (37.6 C), temperature source Rectal, resp. rate 25, SpO2 98 %.  General: on BiPAP, in mild respiratory distress  Psych: Normal affect. Neuro: Alert and oriented X 3. Moves all extremities spontaneously. Neck: Supple without bruits, elevated JVDs. Lungs:  Resp regular and unlabored, crackles B/L. Heart: RRR no s3, s4, or murmurs. Abdomen: Soft, non-tender, non-distended, BS + x 4.  Extremities: No clubbing, cyanosis, mild B/L LE edema. DP/PT/Radials 2+ and equal bilaterally.  Labs   Recent Labs  08/26/15 1306  TROPONINI 0.09*   Lab Results  Component Value Date   WBC 11.1* 08/26/2015   HGB 15.6* 08/26/2015   HCT 46.0 08/26/2015   MCV 73.2* 08/26/2015  PLT 291 08/26/2015     Recent Labs Lab 08/26/15 1306 08/26/15 1328  NA 135 139  K 3.5 3.5  CL 100* 104  CO2 23  --   BUN 16 21*  CREATININE 1.14* 1.10*  CALCIUM 9.2  --   PROT 7.6  --   BILITOT 0.2*  --   ALKPHOS 100  --   ALT 21  --   AST 42*  --   GLUCOSE 366* 371*   Radiology/Studies  Dg Chest Portable 1 View  08/26/2015   CLINICAL DATA:  Shortness of breath.  EXAM: PORTABLE CHEST 1 VIEW  COMPARISON:  None.  FINDINGS:  Borderline cardiomegaly is noted. No pneumothorax or significant pleural effusion is noted. Diffuse patchy airspace opacities are noted throughout both lungs concerning for pneumonia or edema. Bony thorax is unremarkable.  IMPRESSION: Diffuse patchy bilateral airspace opacities are noted concerning for pneumonia or edema.   Electronically Signed   By: Lupita Raider, M.D.   On: 08/26/2015 13:24   Echocardiogram - 08/26/2015 - Left ventricle: The cavity size was normal. There was mild concentric hypertrophy. Systolic function was severely reduced. The estimated ejection fraction was in the range of 20% to 25%. Diffuse hypokinesis. Doppler parameters are consistent with abnormal left ventricular relaxation (grade 1 diastolic dysfunction). The E/e&' ratio is >15, suggesting elevated LV filling pressure. Doppler parameters are consistent with elevated ventricular end-diastolic filling pressure. - Aortic valve: Mildly calcified leaflets. There was no stenosis. There was no regurgitation. - Aortic root: The aortic root was normal in size. - Ascending aorta: The ascending aorta was normal in size. - Mitral valve: There was mild regurgitation. - Right atrium: The atrium was normal in size. - Atrial septum: Bows from left to right, suggesting high LA pressure. There may be a small PFO by color doppler. - Tricuspid valve: There was trivial regurgitation. - Pulmonary arteries: The main pulmonary artery was normal-sized. - Inferior vena cava: The vessel was normal in size. The respirophasic diameter changes were in the normal range (= 50%), consistent with normal central venous pressure. - Pericardium, extracardiac: Small anteriorly located partially organized pericardial effusion which is heterogeneous appearing, could represent loculation or hemopericardium. Features were not consistent with tamponade physiology.  Impressions: - Left ventricle has normal size with mild  concentric LVH and severely decreased systolic function, LVEF 20-25%. There is diffuse hypokinesis.  The fact that there is only grade I diastolic dysfunction with normal left atrial size and LV has normal size is suggestive of an acute process rather than a chronic condition.  ECG: SR, frequent PACs and PVCs, LVH, diffuse ST depressions and inverted T waves    ASSESSMENT AND PLAN  80 year old female with acute respiratory failure sec to acute systolic CHF. Left ventricle has normal size with mild concentric LVH and severely decreased systolic function, LVEF 20-25%. There is diffuse hypokinesis. She is significantly fluid overloaded. The differential includes acute myocarditis, possible giant cell, decompensated hypertensive cardiomyopathy or ischemic cardiomyopathy (lower on differential).  She is in mild respiratory distress in hypercapnic respiratory failure, followed by PPCM. She might require intubation. I will diurese aggressively, give 80 mg iv Lasix x 1, then 40 mg iv every 6 hours. We will continue NTG drip to control BP.   PPCM will place a Swan, we will decide on further management based on results.   The first troponin is 0.09, if continues to rise, we will consider a cath.  We will start Heparin drip after the  central line is placed.   Signed, Lars Masson, MD, [facility] 08/26/2015, 5:07 PM

## 2015-08-26 NOTE — ED Provider Notes (Signed)
CSN: [id number]     Arrival date & time 08/26/15  1238 History   First MD Initiated Contact with Patient 08/26/15 1254     Chief Complaint  Patient presents with  . Shortness of Breath     (Consider location/radiation/quality/duration/timing/severity/associated sxs/prior Treatment) Patient is a 80 y.o. female presenting with shortness of breath. The history is provided by a relative and the EMS personnel.  Shortness of Breath Severity:  Severe Onset quality:  Sudden Duration:  4 hours Timing:  Constant Progression:  Worsening Chronicity:  New Context: not activity, not pollens and not URI   Relieved by:  None tried Worsened by:  Nothing tried Ineffective treatments:  None tried Associated symptoms: chest pain (yesterday)     Past Medical History  Diagnosis Date  . Hypertension    History reviewed. No pertinent past surgical history. History reviewed. No pertinent family history. Social History  Substance Use Topics  . Smoking status: Never Smoker   . Smokeless tobacco: None  . Alcohol Use: No   OB History    No data available     Review of Systems  Unable to perform ROS: Acuity of condition  Respiratory: Positive for shortness of breath.   Cardiovascular: Positive for chest pain (yesterday).      Allergies  Review of patient's allergies indicates no known allergies.  Home Medications   Prior to Admission medications   Not on File   BP 103/72 mmHg  Pulse 200  Temp(Src) 99.7 F (37.6 C) (Rectal)  Resp 34  SpO2 96% Physical Exam  Constitutional: She is oriented to person, place, and time. She appears well-developed and well-nourished. She appears distressed.  HENT:  Head: Normocephalic and atraumatic.  Eyes: Conjunctivae and EOM are normal. Right eye exhibits no discharge. Left eye exhibits no discharge.  Cardiovascular: An irregular rhythm present. Tachycardia present.   Pulmonary/Chest: Accessory muscle usage present. Tachypnea noted. She is in  respiratory distress. She has decreased breath sounds. She has wheezes. She has rales.  Abdominal: Soft. She exhibits no distension. There is no tenderness. There is no rebound.  Musculoskeletal: Normal range of motion. She exhibits no edema or tenderness.  Neurological: She is alert and oriented to person, place, and time.  Skin: Skin is warm. She is diaphoretic.  Nursing note and vitals reviewed.   ED Course  Procedures (including critical care time)  CRITICAL CARE Performed by: Marily Memos   Total critical care time: 70 minutes  Critical care time was exclusive of separately billable procedures and treating other patients.  Critical care was necessary to treat or prevent imminent or life-threatening deterioration.  Critical care was time spent personally by me on the following activities: development of treatment plan with patient and/or surrogate as well as nursing, discussions with consultants, evaluation of patient's response to treatment, examination of patient, obtaining history from patient or surrogate, ordering and performing treatments and interventions, ordering and review of laboratory studies, ordering and review of radiographic studies, pulse oximetry and re-evaluation of patient's condition.   Labs Review Labs Reviewed  COMPREHENSIVE METABOLIC PANEL - Abnormal; Notable for the following:    Chloride 100 (*)    Glucose, Bld 366 (*)    Creatinine, Ser 1.14 (*)    Albumin 3.3 (*)    AST 42 (*)    Total Bilirubin 0.2 (*)    GFR calc non Af Amer 45 (*)    GFR calc Af Amer 52 (*)    All other components within normal limits  CBC WITH DIFFERENTIAL/PLATELET - Abnormal; Notable for the following:    WBC 11.1 (*)    RBC 5.52 (*)    MCV 73.2 (*)    MCH 23.6 (*)    RDW 16.0 (*)    All other components within normal limits  TROPONIN I - Abnormal; Notable for the following:    Troponin I 0.09 (*)    All other components within normal limits  BRAIN NATRIURETIC  PEPTIDE - Abnormal; Notable for the following:    B Natriuretic Peptide 451.8 (*)    All other components within normal limits  I-STAT CHEM 8, ED - Abnormal; Notable for the following:    BUN 21 (*)    Creatinine, Ser 1.10 (*)    Glucose, Bld 371 (*)    Hemoglobin 15.6 (*)    All other components within normal limits  I-STAT ARTERIAL BLOOD GAS, ED - Abnormal; Notable for the following:    pH, Arterial 7.144 (*)    pCO2 arterial 71.7 (*)    pO2, Arterial 79.0 (*)    Bicarbonate 24.6 (*)    Acid-base deficit 6.0 (*)    All other components within normal limits  I-STAT ARTERIAL BLOOD GAS, ED - Abnormal; Notable for the following:    pH, Arterial 7.299 (*)    pCO2 arterial 46.2 (*)    pO2, Arterial 156.0 (*)    Acid-base deficit 4.0 (*)    All other components within normal limits  URINALYSIS, ROUTINE W REFLEX MICROSCOPIC (NOT AT [facility])  BLOOD GAS, ARTERIAL    Imaging Review Dg Chest Portable 1 View  08/26/2015   CLINICAL DATA:  Shortness of breath.  EXAM: PORTABLE CHEST 1 VIEW  COMPARISON:  None.  FINDINGS: Borderline cardiomegaly is noted. No pneumothorax or significant pleural effusion is noted. Diffuse patchy airspace opacities are noted throughout both lungs concerning for pneumonia or edema. Bony thorax is unremarkable.  IMPRESSION: Diffuse patchy bilateral airspace opacities are noted concerning for pneumonia or edema.   Electronically Signed   By: Lupita Raider, M.D.   On: 08/26/2015 13:24   I have personally reviewed and evaluated these images and lab results as part of my medical decision-making.   EKG Interpretation   Date/Time:  Sunday August 26 2015 14:15:26 EDT Ventricular Rate:  200 PR Interval:  111 QRS Duration: 81 QT Interval:  283 QTC Calculation: 516 R Axis:   53 Text Interpretation:  Supraventricular tachycardia LVH with secondary  repolarization abnormality ST depression, probably rate related Confirmed  by [facility] MD, Barbara Cower [phone]) on 08/26/2015 2:27:08  PM      MDM   Final diagnoses:  Acute heart failure, unspecified heart failure type (HCC)  Acute respiratory failure with hypoxia (HCC)  SVT (supraventricular tachycardia) (HCC)   80 year old female with a past medical history of untreated hypertension and no other known medical problems presented to the ED today secondary to significant respiratory distress. Patient apparently had a viral infection for the last 6 days however had been improving. This morning the patient's nephew was trying to talk to her and the patient could not talk secondary to significant dyspnea so she is brought here for evaluation. On my initial examination patient was hypertensive, tachycardic, to With intercostal and supraclavicular retractions to maintain saturations in the mid 80s on a nonrebreather. Had rales throughout lungs. ddx initially was a superimposed staph infection however unlikely with HTN and afebrile but abx ordered anyway. Pt with no heart history, but s/s most c/w acute pulmonary edema.  CXR confirmed bilateral interstitial opacities. Started on nitro drip and BiPaP. Initial abg was respiratory acidosis with hypoxia but improved dramatically after an hour on BiPaP. Cardiology consulted to eval.  After a couple hours on bipap, BP improved, MS improved, respiratory effort improved. She went into SVT a couple times which dropped her proessure. NTG stopped, amiodarone infusion started, cardiology called again and urged to come a bit quicker. Patient without any more episodes of SVT after resolution. Attempted to wean off of mask however patient preferred to keep it on so admitted to ICU.      Marily Memos, MD 08/29/15 2214

## 2015-08-26 NOTE — ED Notes (Signed)
To room via EMS.  Onset yesterday cough, breathing worse today that started 1 hour prior to EMS arrival.  Lungs rhonchi, wheezing.  Was given Albuterol 5 mg en route.  EMS gave 325 mg, NTG x 1, Solumedrol 125 mg.  EMS BP 213/135, HR 125, RR 30.  EKG shows sinus tarchycardia with PVC's.

## 2015-08-26 NOTE — ED Notes (Signed)
Pharmacy called advised amiodarone needed STAT

## 2015-08-27 ENCOUNTER — Inpatient Hospital Stay (HOSPITAL_COMMUNITY): Payer: Medicare Other

## 2015-08-27 ENCOUNTER — Encounter (HOSPITAL_COMMUNITY): Payer: Self-pay | Admitting: Radiology

## 2015-08-27 ENCOUNTER — Encounter (HOSPITAL_COMMUNITY)
Admission: EM | Disposition: A | Discharge status: Other Acute Inpatient Hospital | Payer: Self-pay | Source: Home / Self Care | Attending: Cardiology

## 2015-08-27 ENCOUNTER — Ambulatory Visit (HOSPITAL_COMMUNITY): Admit: 2015-08-27 | Payer: Self-pay | Admitting: Cardiology

## 2015-08-27 DIAGNOSIS — Z452 Encounter for adjustment and management of vascular access device: Secondary | ICD-10-CM | POA: Insufficient documentation

## 2015-08-27 DIAGNOSIS — I2510 Atherosclerotic heart disease of native coronary artery without angina pectoris: Secondary | ICD-10-CM

## 2015-08-27 DIAGNOSIS — R531 Weakness: Secondary | ICD-10-CM | POA: Insufficient documentation

## 2015-08-27 DIAGNOSIS — I509 Heart failure, unspecified: Secondary | ICD-10-CM

## 2015-08-27 DIAGNOSIS — J9602 Acute respiratory failure with hypercapnia: Secondary | ICD-10-CM

## 2015-08-27 DIAGNOSIS — G451 Carotid artery syndrome (hemispheric): Secondary | ICD-10-CM

## 2015-08-27 DIAGNOSIS — G459 Transient cerebral ischemic attack, unspecified: Secondary | ICD-10-CM

## 2015-08-27 HISTORY — PX: CARDIAC CATHETERIZATION: SHX172

## 2015-08-27 LAB — PROTIME-INR
INR: 1.11 (ref 0.00–1.49)
PROTHROMBIN TIME: 14.5 s (ref 11.6–15.2)

## 2015-08-27 LAB — RAPID URINE DRUG SCREEN, HOSP PERFORMED
Amphetamines: NOT DETECTED
BENZODIAZEPINES: NOT DETECTED
Barbiturates: NOT DETECTED
Cocaine: NOT DETECTED
Opiates: NOT DETECTED
Tetrahydrocannabinol: NOT DETECTED

## 2015-08-27 LAB — GLUCOSE, CAPILLARY
Glucose-Capillary: 134 mg/dL — ABNORMAL HIGH (ref 65–99)
Glucose-Capillary: 174 mg/dL — ABNORMAL HIGH (ref 65–99)
Glucose-Capillary: 122 mg/dL — ABNORMAL HIGH (ref 65–99)
Glucose-Capillary: 102 mg/dL — ABNORMAL HIGH (ref 65–99)
Glucose-Capillary: 107 mg/dL — ABNORMAL HIGH (ref 65–99)
Glucose-Capillary: 80 mg/dL (ref 65–99)
Glucose-Capillary: 123 mg/dL — ABNORMAL HIGH (ref 65–99)

## 2015-08-27 LAB — APTT: APTT: 29 s (ref 24–37)

## 2015-08-27 LAB — CBC
HCT: 33.1 % — ABNORMAL LOW (ref 36.0–46.0)
Hemoglobin: 10.9 g/dL — ABNORMAL LOW (ref 12.0–15.0)
MCH: 23.4 pg — ABNORMAL LOW (ref 26.0–34.0)
MCHC: 32.9 g/dL (ref 30.0–36.0)
MCV: 71 fL — ABNORMAL LOW (ref 78.0–100.0)
Platelets: 226 10*3/uL (ref 150–400)
RBC: 4.66 MIL/uL (ref 3.87–5.11)
RDW: 15.9 % — ABNORMAL HIGH (ref 11.5–15.5)
WBC: 17.5 10*3/uL — ABNORMAL HIGH (ref 4.0–10.5)

## 2015-08-27 LAB — MRSA PCR SCREENING: MRSA by PCR: NEGATIVE

## 2015-08-27 LAB — LIPID PANEL
CHOL/HDL RATIO: 2.5 ratio
CHOLESTEROL: 267 mg/dL — AB (ref 0–200)
HDL: 106 mg/dL (ref >40)
LDL Cholesterol: 152 mg/dL — ABNORMAL HIGH (ref 0–99)
Triglycerides: 46 mg/dL (ref <150)
VLDL: 9 mg/dL (ref 0–40)

## 2015-08-27 LAB — TROPONIN I
Troponin I: 0.29 ng/mL — ABNORMAL HIGH (ref <0.031)
Troponin I: 0.22 ng/mL — ABNORMAL HIGH (ref <0.031)

## 2015-08-27 LAB — TSH: TSH: 1.927 u[IU]/mL (ref 0.350–4.500)

## 2015-08-27 LAB — CREATININE, SERUM
CREATININE: 1.35 mg/dL — AB (ref 0.44–1.00)
GFR calc Af Amer: 42 mL/min — ABNORMAL LOW (ref >60)
GFR calc non Af Amer: 37 mL/min — ABNORMAL LOW (ref >60)

## 2015-08-27 SURGERY — RIGHT/LEFT HEART CATH AND CORONARY ANGIOGRAPHY

## 2015-08-27 MED ORDER — LIDOCAINE HCL (CARDIAC) 20 MG/ML IV SOLN
INTRAVENOUS | Status: AC
  Filled 2015-08-27: qty 5

## 2015-08-27 MED ORDER — SODIUM CHLORIDE 0.9 % IV SOLN: 250.0000 mL | INTRAVENOUS | Status: DC | PRN

## 2015-08-27 MED ORDER — SODIUM CHLORIDE 0.9 % IJ SOLN
3.0000 mL | Freq: Two times a day (BID) | INTRAMUSCULAR | Status: DC
  Administered 2015-08-27: 17:00:00 via INTRAVENOUS
  Administered 2015-08-28: 3 mL via INTRAVENOUS
  Administered 2015-08-28 (×2): via INTRAVENOUS
  Administered 2015-08-29: 3 mL via INTRAVENOUS

## 2015-08-27 MED ORDER — HEPARIN SODIUM (PORCINE) 5000 UNIT/ML IJ SOLN
5000.0000 [IU] | Freq: Three times a day (TID) | INTRAMUSCULAR | Status: DC
  Filled 2015-08-27: qty 1

## 2015-08-27 MED ORDER — MIDAZOLAM HCL 2 MG/2ML IJ SOLN
INTRAMUSCULAR | Status: AC
  Filled 2015-08-27: qty 4

## 2015-08-27 MED ORDER — LIDOCAINE HCL (PF) 1 % IJ SOLN
INTRAMUSCULAR | Status: DC | PRN
  Administered 2015-08-27: 20 mL
  Administered 2015-08-27: 5 mL via INTRADERMAL

## 2015-08-27 MED ORDER — SODIUM CHLORIDE 0.9 % IJ SOLN: 3.0000 mL | INTRAMUSCULAR | Status: DC | PRN

## 2015-08-27 MED ORDER — FENTANYL CITRATE (PF) 100 MCG/2ML IJ SOLN
INTRAMUSCULAR | Status: DC | PRN
  Administered 2015-08-27: 25 ug via INTRAVENOUS

## 2015-08-27 MED ORDER — MIDAZOLAM HCL 2 MG/2ML IJ SOLN
INTRAMUSCULAR | Status: DC | PRN
  Administered 2015-08-27 (×2): 1 mg via INTRAVENOUS

## 2015-08-27 MED ORDER — CHLORHEXIDINE GLUCONATE 0.12 % MT SOLN
15.0000 mL | Freq: Two times a day (BID) | OROMUCOSAL | Status: DC
  Administered 2015-08-27 – 2015-08-29 (×5): 15 mL via OROMUCOSAL
  Filled 2015-08-27 (×3): qty 15

## 2015-08-27 MED ORDER — ATORVASTATIN CALCIUM 80 MG PO TABS
80.0000 mg | ORAL_TABLET | Freq: Every day | ORAL | Status: DC
  Administered 2015-08-27 – 2015-08-29 (×3): 80 mg via ORAL
  Filled 2015-08-27 (×3): qty 1

## 2015-08-27 MED ORDER — SODIUM CHLORIDE 0.9 % WEIGHT BASED INFUSION
3.0000 mL/kg/h | INTRAVENOUS | Status: AC
  Administered 2015-08-27: 3 mL/kg/h via INTRAVENOUS

## 2015-08-27 MED ORDER — PHENYLEPHRINE HCL 10 MG/ML IJ SOLN
0.0000 ug/min | INTRAVENOUS | Status: DC
  Filled 2015-08-27: qty 4

## 2015-08-27 MED ORDER — LIDOCAINE HCL (PF) 1 % IJ SOLN
INTRAMUSCULAR | Status: AC
  Filled 2015-08-27: qty 30

## 2015-08-27 MED ORDER — ISOSORB DINITRATE-HYDRALAZINE 20-37.5 MG PO TABS
1.0000 | ORAL_TABLET | Freq: Three times a day (TID) | ORAL | Status: DC
  Administered 2015-08-27 – 2015-08-29 (×8): 1 via ORAL
  Filled 2015-08-27 (×8): qty 1

## 2015-08-27 MED ORDER — SODIUM CHLORIDE 0.9 % IJ SOLN
3.0000 mL | Freq: Two times a day (BID) | INTRAMUSCULAR | Status: DC
  Administered 2015-08-27 – 2015-08-28 (×3): 3 mL via INTRAVENOUS
  Administered 2015-08-28: 10:00:00 via INTRAVENOUS

## 2015-08-27 MED ORDER — CETYLPYRIDINIUM CHLORIDE 0.05 % MT LIQD
7.0000 mL | Freq: Two times a day (BID) | OROMUCOSAL | Status: DC
  Administered 2015-08-27 – 2015-08-28 (×3): 7 mL via OROMUCOSAL

## 2015-08-27 MED ORDER — ROCURONIUM BROMIDE 50 MG/5ML IV SOLN
INTRAVENOUS | Status: AC
  Filled 2015-08-27: qty 2

## 2015-08-27 MED ORDER — ACETAMINOPHEN 325 MG PO TABS
650.0000 mg | ORAL_TABLET | ORAL | Status: DC | PRN
  Administered 2015-08-28 – 2015-08-29 (×2): 650 mg via ORAL
  Filled 2015-08-27 (×2): qty 2

## 2015-08-27 MED ORDER — ASPIRIN 81 MG PO CHEW
81.0000 mg | CHEWABLE_TABLET | ORAL | Status: DC
Start: 2015-08-28 — End: 2015-08-27

## 2015-08-27 MED ORDER — FENTANYL CITRATE (PF) 100 MCG/2ML IJ SOLN
INTRAMUSCULAR | Status: AC
  Filled 2015-08-27: qty 4

## 2015-08-27 MED ORDER — SODIUM CHLORIDE 0.9 % IV SOLN
INTRAVENOUS | Status: DC
  Administered 2015-08-28: 06:00:00 via INTRAVENOUS

## 2015-08-27 MED ORDER — NITROGLYCERIN IN D5W 200-5 MCG/ML-% IV SOLN
0.0000 ug/min | INTRAVENOUS | Status: DC
  Administered 2015-08-27: 20 ug/min via INTRAVENOUS
  Administered 2015-08-27: 10 ug/min via INTRAVENOUS

## 2015-08-27 MED ORDER — ONDANSETRON HCL 4 MG/2ML IJ SOLN
4.0000 mg | Freq: Four times a day (QID) | INTRAMUSCULAR | Status: DC | PRN
  Administered 2015-08-28: 4 mg via INTRAVENOUS
  Filled 2015-08-27: qty 2

## 2015-08-27 MED ORDER — IOHEXOL 350 MG/ML SOLN
INTRAVENOUS | Status: DC | PRN
  Administered 2015-08-27: 55 mL via INTRA_ARTERIAL

## 2015-08-27 MED ORDER — CARVEDILOL 6.25 MG PO TABS
6.2500 mg | ORAL_TABLET | Freq: Two times a day (BID) | ORAL | Status: DC
  Administered 2015-08-27: 6.25 mg via ORAL
  Filled 2015-08-27: qty 1

## 2015-08-27 MED ORDER — POTASSIUM CHLORIDE CRYS ER 20 MEQ PO TBCR
40.0000 meq | EXTENDED_RELEASE_TABLET | Freq: Once | ORAL | Status: AC
  Administered 2015-08-27: 40 meq via ORAL
  Filled 2015-08-27: qty 2

## 2015-08-27 MED ORDER — ASPIRIN 81 MG PO CHEW: 81.0000 mg | CHEWABLE_TABLET | Freq: Every day | ORAL | Status: DC

## 2015-08-27 MED ORDER — HEPARIN (PORCINE) IN NACL 2-0.9 UNIT/ML-% IJ SOLN
INTRAMUSCULAR | Status: DC | PRN
  Administered 2015-08-27: 14:00:00

## 2015-08-27 MED ORDER — SUCCINYLCHOLINE CHLORIDE 20 MG/ML IJ SOLN
INTRAMUSCULAR | Status: AC
  Filled 2015-08-27: qty 1

## 2015-08-27 MED ORDER — HEPARIN (PORCINE) IN NACL 2-0.9 UNIT/ML-% IJ SOLN
INTRAMUSCULAR | Status: AC
  Filled 2015-08-27: qty 1500

## 2015-08-27 MED ORDER — SODIUM CHLORIDE 0.9 % IV SOLN: INTRAVENOUS | Status: DC | PRN

## 2015-08-27 MED ORDER — NITROGLYCERIN IN D5W 200-5 MCG/ML-% IV SOLN
INTRAVENOUS | Status: AC
  Administered 2015-08-27: 10 ug
  Filled 2015-08-27: qty 250

## 2015-08-27 MED ORDER — FUROSEMIDE 10 MG/ML IJ SOLN: 40.0000 mg | Freq: Three times a day (TID) | INTRAMUSCULAR | Status: DC

## 2015-08-27 MED ORDER — ETOMIDATE 2 MG/ML IV SOLN
INTRAVENOUS | Status: AC
  Filled 2015-08-27: qty 20

## 2015-08-27 MED ORDER — SODIUM CHLORIDE 0.9 % IV SOLN
250.0000 mL | INTRAVENOUS | Status: DC | PRN
  Administered 2015-08-27: 250 mL via INTRAVENOUS

## 2015-08-27 MED FILL — Medication: Qty: 1 | Status: AC

## 2015-08-27 SURGICAL SUPPLY — 12 items
CATH BALLN WEDGE 5F 110CM (CATHETERS) ×3 IMPLANT
CATH INFINITI 5FR MULTPACK ANG (CATHETERS) ×3 IMPLANT
CATH SWAN GANZ 7F STRAIGHT (CATHETERS) ×3 IMPLANT
GUIDEWIRE .025 260CM (WIRE) ×3 IMPLANT
KIT HEART LEFT (KITS) ×3 IMPLANT
KIT HEART RIGHT NAMIC (KITS) ×3 IMPLANT
PACK CARDIAC CATHETERIZATION (CUSTOM PROCEDURE TRAY) ×3 IMPLANT
SHEATH FAST CATH BRACH 5F 5CM (SHEATH) ×3 IMPLANT
SHEATH PINNACLE 5F 10CM (SHEATH) ×3 IMPLANT
SHEATH PINNACLE 7F 10CM (SHEATH) ×3 IMPLANT
TRANSDUCER W/STOPCOCK (MISCELLANEOUS) ×3 IMPLANT
WIRE EMERALD 3MM-J .035X150CM (WIRE) ×3 IMPLANT

## 2015-08-27 NOTE — Care Management Note (Signed)
Case Management Note  Patient Details  Name: Julie Stein MRN: [id number] Date of Birth: [birth year]  Subjective/Objective:       Adm w resp failure, bipap on adm             Action/Plan: lives w husband   Expected Discharge Date:                  Expected Discharge Plan:     In-House Referral:     Discharge planning Services     Post Acute Care Choice:    Choice offered to:     DME Arranged:    DME Agency:     HH Arranged:    HH Agency:     Status of Service:     Medicare Important Message Given:    Date Medicare IM Given:    Medicare IM give by:    Date Additional Medicare IM Given:    Additional Medicare Important Message give by:     If discussed at Long Length of Stay Meetings, dates discussed:    Additional Comments: ur review done  Hanley Hays, RN 08/27/2015, 9:32 AM

## 2015-08-27 NOTE — Procedures (Signed)
Arterial Catheter Insertion Procedure Note Julie Stein [id number] [birth year]  Procedure: Insertion of Arterial Catheter  Indications: Blood pressure monitoring and Frequent blood sampling  Procedure Details Consent: Risks of procedure as well as the alternatives and risks of each were explained to the (patient/caregiver).  Consent for procedure obtained. Time Out: Verified patient identification, verified procedure, site/side was marked, verified correct patient position, special equipment/implants available, medications/allergies/relevent history reviewed, required imaging and test results available.  Performed  Maximum sterile technique was used including antiseptics, cap, gloves, gown, hand hygiene, mask and sheet. Skin prep: Chlorhexidine; local anesthetic administered 20 gauge catheter was inserted into right radial artery using the Seldinger technique.  Evaluation Blood flow good; BP tracing good. Complications: No apparent complications.   [facility], Rapheal Masso 08/27/2015

## 2015-08-27 NOTE — Progress Notes (Signed)
STROKE TEAM PROGRESS NOTE   HISTORY Julie Stein is an 80 y.o. female with a history of hypertension and poor compliance, admitted with acute respiratory failure and systolic heart failure. Patient was started on BiPAP for respiratory support. She experienced an episode of hypotension and unresponsiveness following admission. On becoming responsive again she is unable to move her left extremities transiently. CT scan of her head is pending. She has no history of stroke nor TIA. Left-sided weakness has since resolved. She was LSN 10:54 PM on 08/26/2015. tPA was not as deficits resolved and patient was on IV heparin drip.    SUBJECTIVE (INTERVAL HISTORY) Her daughters are at the bedside.  Overall she feels her condition is completely resolved. She has transient LOC and on woke up not able to mover left side UE and LE. Resolved on arrival of neuro consult. No recurrence so far. On IV heparin. EF 20-25%. MRI pending.   OBJECTIVE Temp:  [97.5 F (36.4 C)-99.7 F (37.6 C)] 97.9 F (36.6 C) (10/10 0828) Pulse Rate:  [36-200] 89 (10/10 0834) Cardiac Rhythm:  [-] Normal sinus rhythm (10/10 0400) Resp:  [18-39] 29 (10/10 0834) BP: (67-209)/(39-151) 137/69 mmHg (10/10 0600) SpO2:  [85 %-100 %] 100 % (10/10 0834) Arterial Line BP: (136-167)/(67-80) 167/69 mmHg (10/10 0600) FiO2 (%):  [55 %-100 %] 55 % (10/10 0519) Weight:  [49.6 kg (109 lb 5.6 oz)] 49.6 kg (109 lb 5.6 oz) (10/09 1826)  CBC:  Recent Labs Lab 08/26/15 1306  08/26/15 1950 08/27/15 0700  WBC 11.1*  --  17.3* 17.5*  NEUTROABS 7.7  --   --   --   HGB 13.0  < > 12.4 10.9*  HCT 40.4  < > 38.9 33.1*  MCV 73.2*  --  73.5* 71.0*  PLT 291  --  228 226  < > = values in this interval not displayed.  Basic Metabolic Panel:  Recent Labs Lab 08/26/15 1306 08/26/15 1328 08/26/15 1950  NA 135 139 134*  K 3.5 3.5 3.6  CL 100* 104 95*  CO2 23  --  25  GLUCOSE 366* 371* 196*  BUN 16 21* 19  CREATININE 1.14* 1.10* 1.11*  CALCIUM 9.2   --  9.0  MG  --   --  1.8  PHOS  --   --  3.9    Lipid Panel:     Component Value Date/Time   CHOL 267* 08/27/2015 0600   TRIG 46 08/27/2015 0600   HDL 106 08/27/2015 0600   CHOLHDL 2.5 08/27/2015 0600   VLDL 9 08/27/2015 0600   LDLCALC 152* 08/27/2015 0600   HgbA1c: No results found for: HGBA1C Urine Drug Screen: No results found for: LABOPIA, COCAINSCRNUR, LABBENZ, AMPHETMU, THCU, LABBARB   IMAGING I have personally reviewed the radiological images below and agree with the radiology interpretations.  Ct Head Wo Contrast  08/27/2015  IMPRESSION: 1. No acute intracranial process. 2. Age-related cerebral atrophy with chronic small vessel ischemic disease.   Electronically Signed   By: Rise Mu M.D.   On: 08/27/2015 02:31   Dg Chest Port 1 View  08/26/2015  IMPRESSION: Diffuse patchy bilateral airspace opacities are noted concerning for pneumonia or edema.    2D Echocardiogram   - Left ventricle: The cavity size was normal. There was mildconcentric hypertrophy. Systolic function was severely reduced.The estimated ejection fraction was in the range of 20% to 25%.Diffuse hypokinesis. Doppler parameters are consistent withabnormal left ventricular relaxation (grade 1 diastolicdysfunction). The E/e&' ratio is >15, suggesting  elevated LVfilling pressure. Doppler parameters are consistent with elevatedventricular end-diastolic filling pressure. - Aortic valve: Mildly calcified leaflets. There was no stenosis.There was no regurgitation. - Aortic root: The aortic root was normal in size. - Ascending aorta: The ascending aorta was normal in size. - Mitral valve: There was mild regurgitation. - Right atrium: The atrium was normal in size. - Atrial septum: Bows from left to right, suggesting high LApressure. There may be a small PFO by color doppler. - Tricuspid valve: There was trivial regurgitation. - Pulmonary arteries: The main pulmonary artery was normal-sized. -  Inferior vena cava: The vessel was normal in size. Therespirophasic diameter changes were in the normal range (= 50%),consistent with normal central venous pressure. - Pericardium, extracardiac: Small anteriorly located partiallyorganized pericardial effusion which is heterogeneous appearing,could represent loculation or hemopericardium. Features were notconsistent with tamponade physiology. Impressions: Left ventricle has normal size with mild concentric LVH andseverely decreased systolic function, LVEF 20-25%.There is diffuse hypokinesis.  MRI and MRA pending  CUS pending  Cardiac cath pending   PHYSICAL EXAM  Temp:  [97.5 F (36.4 C)-98.7 F (37.1 C)] 97.9 F (36.6 C) (10/10 0828) Pulse Rate:  [0-110] 0 (10/10 1432) Resp:  [0-34] 0 (10/10 1432) BP: (67-209)/(39-151) 135/91 mmHg (10/10 1427) SpO2:  [0 %-100 %] 0 % (10/10 1432) Arterial Line BP: (136-177)/(63-82) 161/63 mmHg (10/10 1200) FiO2 (%):  [55 %-100 %] 55 % (10/10 0800) Weight:  [109 lb 5.6 oz (49.6 kg)] 109 lb 5.6 oz (49.6 kg) (10/09 1826)  General - Well nourished, well developed, in no apparent distress.  Ophthalmologic - Fundi not visualized due to eye movement.  Cardiovascular - Regular rate and rhythm.  Mental Status -  Level of arousal and orientation to time, place, and person were intact. Language including expression, naming, repetition, comprehension was assessed and found intact. Fund of Knowledge was assessed and was intact.  Cranial Nerves II - XII - II - Visual field intact OU. III, IV, VI - Extraocular movements intact. V - Facial sensation intact bilaterally. VII - Facial movement intact bilaterally. VIII - Hearing & vestibular intact bilaterally. X - Palate elevates symmetrically. XI - Chin turning & shoulder shrug intact bilaterally. XII - Tongue protrusion intact.  Motor Strength - The patient's strength was normal in all extremities and pronator drift was absent.  Bulk was normal  and fasciculations were absent.   Motor Tone - Muscle tone was assessed at the neck and appendages and was normal.  Reflexes - The patient's reflexes were 1+ in all extremities and she had no pathological reflexes.  Sensory - Light touch, temperature/pinprick were assessed and were symmetrical.    Coordination - The patient had normal movements in the hands and feet with no ataxia or dysmetria.  Tremor was absent.  Gait and Station - deferred due to NSTEMI.   ASSESSMENT/PLAN Ms. Julie Stein is a 80 y.o. female with history of hypertension and poor compliance. Admitted to the hospital with acute respriatory failure and systolic heart failure who developed an episode of hypotension and unresponsiveness; upon awaking, she is only able to move her L side transiently. She did not receive IV t-PA due to resolving deficits, pt on IV heparin.   Stroke vs. TIA:  Right brain, hyperperfusion due to hypotensive event vs. Cardioembolic given low EF   Resultant  resolution of deficit  MRI  ordered  MRA  ordered  Carotid Doppler  pending  2D Echo  EF 20-25%   LDL 152  HgbA1c pending  IV heparin for VTE prophylaxis  Diet NPO time specified  no antithrombotic listed prior to admission, now on aspirin 81 mg orally every day and heparin IV.  Ongoing aggressive stroke risk factor management  Therapy recommendations:  Pending  Disposition:  Pending  NSTEMI  Elevated troponin  Cardiologist on board  On IV heparin  Cardiac cath pending  2D echo showed EF 20-25%  SVT  Cardiology on board  On amiodarone IV  Telemetry monitoring  Malignant Hypertension with pulmonary edema followed by severe Hypotension  BP as high as 209/127, as low as 67/39  Currently Stable  BP goal normotensive  Hyperlipidemia  LDL 152, goal LDL < 70   Home Meds: no statin  On Lipitor 80  Continue statin on discharge  Her Other Stroke Risk Factors  Advanced age  CAD  Other Active  Problems  Viral illness x 1 week  Acute systolic CHF w/ EF 20-25%  Hospital day # 1   This patient is critically ill due to TIA episode with LOC and left hemiplegia, NSTEMI, CHF, SVT and at significant risk of neurological worsening, death form recurrent stroke, heart failure, respiratory failure, and sudden death. This patient's care requires constant monitoring of vital signs, hemodynamics, respiratory and cardiac monitoring, review of multiple databases, neurological assessment, discussion with family, other specialists and medical decision making of high complexity. I spent 35 minutes of neurocritical care time in the care of this patient.   Marvel Plan, MD PhD Stroke Neurology 08/27/2015 3:49 PM     To contact Stroke Continuity provider, please refer to A that averages [email or web address] After hours, contact General Neurology

## 2015-08-27 NOTE — Progress Notes (Signed)
Dr Mayford Knife called and made aware of pt episode of being unresponsive. Informed not to restart Nipride unless SBP>170. Will continue to monitor.

## 2015-08-27 NOTE — Consult Note (Addendum)
PULMONARY / CRITICAL CARE MEDICINE   Name: Julie Stein MRN: [id number] DOB: 28-Sep-1935    ADMISSION DATE:  08/26/2015 CONSULTATION DATE:  10/9  REFERRING MD :  Delton See (Cardiology)   CHIEF COMPLAINT:  Respiratory failure   INITIAL PRESENTATION: 80yo female never smoker with hx HTN who presented 10/9 with sudden onset SOB after 1 week of viral s/s including cough, congestion, diarrhea.  In ER CXR revealed significant pulmonary edema and bedside echo showed EF 20-25% with diffuse hypokinesis.  Pt placed on bipap in ER and admitted to ICU per cards, PCCM consulted.   STUDIES:  2D echo 10/9>>> severely reduced systolic function with EF 20-25%, diffuse hypokinesis, grade 1 diastolic dysfunction, small organized pericardial effusion Ct head 10/9- neg MRI brain 10/10>>> Cath 10/10>>>  SIGNIFICANT EVENTS: 10/9 -chang ein MS resolved 10/10- improved resp status   SUBJECTIVE: no distress, NO NIMV  VITAL SIGNS: Temp:  [97.5 F (36.4 C)-99.7 F (37.6 C)] 97.9 F (36.6 C) (10/10 0828) Pulse Rate:  [36-200] 87 (10/10 0900) Resp:  [18-39] 23 (10/10 0900) BP: (67-209)/(39-151) 118/86 mmHg (10/10 0900) SpO2:  [85 %-100 %] 98 % (10/10 0900) Arterial Line BP: (136-177)/(67-80) 152/69 mmHg (10/10 0900) FiO2 (%):  [55 %-100 %] 55 % (10/10 0800) Weight:  [49.6 kg (109 lb 5.6 oz)] 49.6 kg (109 lb 5.6 oz) (10/09 1826) HEMODYNAMICS: CVP:  [3 mmHg] 3 mmHg VENTILATOR SETTINGS: Vent Mode:  [-]  FiO2 (%):  [55 %-100 %] 55 % INTAKE / OUTPUT:  Intake/Output Summary (Last 24 hours) at 08/27/15 1055 Last data filed at 08/27/15 0900  Gross per 24 hour  Intake  567.3 ml  Output   1600 ml  Net -1032.7 ml    PHYSICAL EXAMINATION: General:  Thin female, no distress Neuro:  Awake, alert, appropriate, no weakness, no droop  HEENT:  Mm dry Cardiovascular:  s1s2 NSR frequent PAC's  Lungs: CTA ant Abdomen:  Soft, non tender, +bs  Musculoskeletal:  Warm and dry, scant BLE edema    LABS:  CBC  Recent Labs Lab 08/26/15 1306 08/26/15 1328 08/26/15 1950 08/27/15 0700  WBC 11.1*  --  17.3* 17.5*  HGB 13.0 15.6* 12.4 10.9*  HCT 40.4 46.0 38.9 33.1*  PLT 291  --  228 226   Coag's  Recent Labs Lab 08/27/15 0034  APTT 29  INR 1.11   BMET  Recent Labs Lab 08/26/15 1306 08/26/15 1328 08/26/15 1950  NA 135 139 134*  K 3.5 3.5 3.6  CL 100* 104 95*  CO2 23  --  25  BUN 16 21* 19  CREATININE 1.14* 1.10* 1.11*  GLUCOSE 366* 371* 196*   Electrolytes  Recent Labs Lab 08/26/15 1306 08/26/15 1950  CALCIUM 9.2 9.0  MG  --  1.8  PHOS  --  3.9   Sepsis Markers No results for input(s): LATICACIDVEN, PROCALCITON, O2SATVEN in the last 168 hours. ABG  Recent Labs Lab 08/26/15 1325 08/26/15 1442  PHART 7.144* 7.299*  PCO2ART 71.7* 46.2*  PO2ART 79.0* 156.0*   Liver Enzymes  Recent Labs Lab 08/26/15 1306 08/26/15 1950  AST 42* 59*  ALT 21 30  ALKPHOS 100 94  BILITOT 0.2* 0.3  ALBUMIN 3.3* 3.5   Cardiac Enzymes  Recent Labs Lab 08/26/15 1950 08/27/15 0230 08/27/15 0700  TROPONINI 0.31* 0.29* 0.22*   Glucose  Recent Labs Lab 08/26/15 2124 08/27/15 0032 08/27/15 0605  GLUCAP 174* 134* 122*    Imaging Ct Head Wo Contrast  08/27/2015   CLINICAL DATA:  Initial evaluation for acute unresponsiveness. , left-sided weakness, now resolved.  EXAM: CT HEAD WITHOUT CONTRAST  TECHNIQUE: Contiguous axial images were obtained from the base of the skull through the vertex without intravenous contrast.  COMPARISON:  None.  FINDINGS: Age-related cerebral volume loss present. Patchy and confluent hypodensity within the periventricular and deep white matter both cerebral hemispheres most consistent with chronic small vessel ischemic disease. Scattered vascular calcifications within the carotid siphons and distal right vertebral artery.  No acute large vessel territory infarct. Gray-white matter differentiation grossly maintained. Deep gray nuclei  are symmetric. No acute intracranial hemorrhage. No mass lesion, midline shift, or mass effect. No hydrocephalus. No extra-axial fluid collection.  Scalp soft tissues within normal limits. No acute abnormality about the orbits.  Paranasal sinuses mastoid air cells are clear.  Calvarium intact.  IMPRESSION: 1. No acute intracranial process. 2. Age-related cerebral atrophy with chronic small vessel ischemic disease.   Electronically Signed   By: Rise Mu M.D.   On: 08/27/2015 02:31   Dg Chest Port 1 View  08/26/2015   CLINICAL DATA:  80 year old female central line placement. Initial encounter.  EXAM: PORTABLE CHEST 1 VIEW  COMPARISON:  1306 hours today.  FINDINGS: Portable AP semi upright view at 2259 hours. Left IJ central line has been placed. Tip is at the lower SVC level. No pneumothorax. Stable cardiac size and mediastinal contours. Interval mild regression of widespread patchy in perihilar opacity, improvement is greater in the upper lobes. Residual in the lower lobes. No large pleural effusion.  IMPRESSION: 1. Left IJ central line placed, tip at the lower SVC level with no adverse features. 2. Some interval regression of pulmonary edema, residual in the lower lungs.   Electronically Signed   By: Odessa Fleming M.D.   On: 08/26/2015 23:06   Dg Chest Portable 1 View  08/26/2015   CLINICAL DATA:  Shortness of breath.  EXAM: PORTABLE CHEST 1 VIEW  COMPARISON:  None.  FINDINGS: Borderline cardiomegaly is noted. No pneumothorax or significant pleural effusion is noted. Diffuse patchy airspace opacities are noted throughout both lungs concerning for pneumonia or edema. Bony thorax is unremarkable.  IMPRESSION: Diffuse patchy bilateral airspace opacities are noted concerning for pneumonia or edema.   Electronically Signed   By: Lupita Raider, M.D.   On: 08/26/2015 13:24     ASSESSMENT / PLAN:  PULMONARY Acute respiratory failure - r/t pulmonary edema in setting acute systolic heart failure   Respiratory acidosis  Effusions likley P:   pcxr in am  No NIMV needed Diuresis required, despite cvp, accuracy? Evaluate post cath from being flat  CARDIOVASCULAR CVL  Acute systolic heart failure - unclear etiology.  ?? Viral myocarditis v decompensated Hypertensive cardiomyopathy v ischemic cardiomyopathy (less likely) HTN urgency  Pulmonary edema  P:  Diuresis per cards  Heparin To cath lab Consider swan  RENAL AKI - mild v CKD (no previous labs)  P:   F/u chem  Consider lasix, restart  Re assess chem today  GASTROINTESTINAL No active issue  P:   NPO for cath  HEMATOLOGIC No active issue  P:  F/u CBC  Hep drip  INFECTIOUS Suspect viral illness  Leukocytosis  P:   U/a  10/9>>>  Rocephin 10/9 x1  Azithro 10/9 x1  Ensure urine leg Consider serology cmv, coxsach etc Consider pct Get flu  ENDOCRINE Hyperglycemia - no documented hx DM   P:   SSI  TSh  NEUROLOGIC R/o TIA, watershed P:  For mri Per neuro   FAMILY  - Updates:  Pt updated at length 10/10   Mcarthur Rossetti. Tyson Alias, MD, FACP Pgr: [phone] [facility]

## 2015-08-27 NOTE — H&P (View-Only) (Signed)
Patient ID: Julie Stein, female   DOB: [birth year], 80 y.o.   MRN: [id number]   SUBJECTIVE: Breathing is improving.  No residual foot weakness.    Scheduled Meds: . antiseptic oral rinse  7 mL Mouth Rinse q12n4p  . aspirin EC  81 mg Oral Daily  . chlorhexidine  15 mL Mouth Rinse BID  . furosemide  40 mg Intravenous 3 times per day  . insulin aspart  0-15 Units Subcutaneous 6 times per day  . isosorbide-hydrALAZINE  1 tablet Oral TID  . potassium chloride  40 mEq Oral Once  . sodium chloride  3 mL Intravenous Q12H   Continuous Infusions: . amiodarone Stopped (08/27/15 0015)  . heparin 500 Units/hr (08/27/15 0301)  . nitroPRUSSide Stopped (08/27/15 0015)  . phenylephrine (NEO-SYNEPHRINE) Adult infusion     PRN Meds:.Place/Maintain arterial line **AND** sodium chloride    Filed Vitals:   08/27/15 0400 08/27/15 0500 08/27/15 0519 08/27/15 0600  BP: 132/74 114/71  137/69  Pulse: 76 77 97 82  Temp: 98.7 F (37.1 C)     TempSrc: Axillary     Resp: 33 Height:      Weight:      SpO2: 100% 100% 99% 96%    Intake/Output Summary (Last 24 hours) at 08/27/15 0739 Last data filed at 08/27/15 0600  Gross per 24 hour  Intake  549.3 ml  Output   1600 ml  Net -1050.7 ml    LABS: Basic Metabolic Panel:  Recent Labs  87/56/43 1306 08/26/15 1328 08/26/15 1950  NA 135 139 134*  K 3.5 3.5 3.6  CL 100* 104 95*  CO2 23  --  25  GLUCOSE 366* 371* 196*  BUN 16 21* 19  CREATININE 1.14* 1.10* 1.11*  CALCIUM 9.2  --  9.0  MG  --   --  1.8  PHOS  --   --  3.9   Liver Function Tests:  Recent Labs  08/26/15 1306 08/26/15 1950  AST 42* 59*  ALT 21 30  ALKPHOS 100 94  BILITOT 0.2* 0.3  PROT 7.6 7.5  ALBUMIN 3.3* 3.5   No results for input(s): LIPASE, AMYLASE in the last 72 hours. CBC:  Recent Labs  08/26/15 1306  08/26/15 1950 08/27/15 0700  WBC 11.1*  --  17.3* 17.5*  NEUTROABS 7.7  --   --   --   HGB 13.0  < > 12.4 10.9*  HCT 40.4  < > 38.9 33.1*  MCV  73.2*  --  73.5* 71.0*  PLT 291  --  228 226  < > = values in this interval not displayed. Cardiac Enzymes:  Recent Labs  08/26/15 1850 08/26/15 1950 08/27/15 0230  TROPONINI 0.27* 0.31* 0.29*   BNP: Invalid input(s): POCBNP D-Dimer: No results for input(s): DDIMER in the last 72 hours. Hemoglobin A1C: No results for input(s): HGBA1C in the last 72 hours. Fasting Lipid Panel: No results for input(s): CHOL, HDL, LDLCALC, TRIG, CHOLHDL, LDLDIRECT in the last 72 hours. Thyroid Function Tests:  Recent Labs  08/27/15 0034  TSH 1.927   Anemia Panel: No results for input(s): VITAMINB12, FOLATE, FERRITIN, TIBC, IRON, RETICCTPCT in the last 72 hours.  RADIOLOGY: Ct Head Wo Contrast  08/27/2015   CLINICAL DATA:  Initial evaluation for acute unresponsiveness. , left-sided weakness, now resolved.  EXAM: CT HEAD WITHOUT CONTRAST  TECHNIQUE: Contiguous axial images were obtained from the base of the skull through the vertex without intravenous contrast.  COMPARISON:  None.  FINDINGS: Age-related cerebral volume loss present. Patchy and confluent hypodensity within the periventricular and deep white matter both cerebral hemispheres most consistent with chronic small vessel ischemic disease. Scattered vascular calcifications within the carotid siphons and distal right vertebral artery.  No acute large vessel territory infarct. Gray-white matter differentiation grossly maintained. Deep gray nuclei are symmetric. No acute intracranial hemorrhage. No mass lesion, midline shift, or mass effect. No hydrocephalus. No extra-axial fluid collection.  Scalp soft tissues within normal limits. No acute abnormality about the orbits.  Paranasal sinuses mastoid air cells are clear.  Calvarium intact.  IMPRESSION: 1. No acute intracranial process. 2. Age-related cerebral atrophy with chronic small vessel ischemic disease.   Electronically Signed   By: Rise Mu M.D.   On: 08/27/2015 02:31   Dg Chest  Port 1 View  08/26/2015   CLINICAL DATA:  80 year old female central line placement. Initial encounter.  EXAM: PORTABLE CHEST 1 VIEW  COMPARISON:  1306 hours today.  FINDINGS: Portable AP semi upright view at 2259 hours. Left IJ central line has been placed. Tip is at the lower SVC level. No pneumothorax. Stable cardiac size and mediastinal contours. Interval mild regression of widespread patchy in perihilar opacity, improvement is greater in the upper lobes. Residual in the lower lobes. No large pleural effusion.  IMPRESSION: 1. Left IJ central line placed, tip at the lower SVC level with no adverse features. 2. Some interval regression of pulmonary edema, residual in the lower lungs.   Electronically Signed   By: Odessa Fleming M.D.   On: 08/26/2015 23:06   Dg Chest Portable 1 View  08/26/2015   CLINICAL DATA:  Shortness of breath.  EXAM: PORTABLE CHEST 1 VIEW  COMPARISON:  None.  FINDINGS: Borderline cardiomegaly is noted. No pneumothorax or significant pleural effusion is noted. Diffuse patchy airspace opacities are noted throughout both lungs concerning for pneumonia or edema. Bony thorax is unremarkable.  IMPRESSION: Diffuse patchy bilateral airspace opacities are noted concerning for pneumonia or edema.   Electronically Signed   By: Lupita Raider, M.D.   On: 08/26/2015 13:24    PHYSICAL EXAM General: NAD Neck: JVP 8-9 cm, no thyromegaly or thyroid nodule.  Lungs: Crackles at bases bilaterally.  CV: Nondisplaced PMI.  Heart regular S1/S2, no S3/S4, no murmur.  No peripheral edema.   Abdomen: Soft, nontender, no hepatosplenomegaly, no distention.  Neurologic: Alert and oriented x 3, muscle strength grossly normal in all major muscle groups.   Psych: Normal affect. Extremities: No clubbing or cyanosis.   TELEMETRY: Reviewed telemetry pt in NSR with occasional PVCs  ASSESSMENT AND PLAN: 80 yo with history of HTN on no meds prior to admission was admitted with acute dyspnea and mild chest pain.   Noted to have acute respiratory failure, initially requiring Bipap.  CXR with pulmonary edema and echo with EF 20-25%, diffuse hypokinesis.  1. Acute systolic CHF: No prior echo.  Echo done with EF 20-25%, mild LVH, diffuse hypokinesis.  Pulmonary edema on CXR.  Hypertensive, no meds at home.  Had viral-type prodrome preceding this admission.  Cause of cardiomyopathy uncertain => viral myocarditis versus ischemic cardiomyopathy versus hypertensive cardiomyopathy.  Still some volume overload, diuresing with Lasix.  - Continue Lasix 40 mg IV every 8 hours.  - NTG gtt for now to keep SBP < 150.  - Add Bidil 1 tab tid.  - Beta blocker when pulmonary edema resolved, ACEI after cath.  - Will need right and left heart cath to  define presence/absence of coronary disease as a cause of her symptoms.  We discussed the risks/benefits of this procedure today and she agrees to proceed.  2. HTN: Hypertensive emergency with pulmonary edema and admission.  SBP in 190s currently.  Will restart NTG gtt and add Bidil as above.  No CVA on CT head, no residual neuro deficits. Careful not to excessively lower BP, will aim for SBP around 140-150 for now.  3. TIA: Possible TIA with left foot weakness.  This has completely resolved.  CT head w/o CVA or bleed.  Plan for MRI head.  She is on aspirin.  Avoid large fall in BP, as above aim for SBP 140-150 for now.  4. Elevated troponin: Mild elevation, maximal 0.31.  Possibly demand ischemia in the setting of CHF/volume overload.  Continue ASA, coronary angiography today.  5. ID: No definite evidence for infection, possible viral illness leading up to this presentation, sent coxsackievirus titers. 6. SVT: No further SVT this morning, now off amiodarone.  Will continue to follow telemetry.   35 minutes critical care time.   Marca Ancona 08/27/2015 7:50 AM

## 2015-08-27 NOTE — Progress Notes (Signed)
Pt refused to take ASA suppository. Pt informed on importance but still refused. Pt is NPO on Bipap. Will continue to monitor.

## 2015-08-27 NOTE — Progress Notes (Signed)
Pt found to be unresponsive while A line being placed with respiratory. HR 50's Dr. Celene Skeen came to bedside drips turned off per MD order. Pt slowly opened eyes, following commands intermittently, however not moving left lower leg. MD remained at bedside. CT scan ordered, daughter at bedside and updated. Pt last seen normal at midnight.

## 2015-08-27 NOTE — Consult Note (Signed)
Admission H&P    Chief Complaint: Transient left-sided weakness.  HPI: Julie Stein is an 80 y.o. female with a history of hypertension and poor compliance, admitted with acute respiratory failure and systolic heart failure. Patient was started on BiPAP for respiratory support. She experienced an episode of hypotension and unresponsiveness following admission. On becoming responsive again she is unable to move her left extremities transiently. CT scan of her head is pending. She has no history of stroke nor TIA. Left-sided weakness has since resolved.  LSN: 10:54 PM on 08/26/2015 tPA Given: No: Deficits resolved some; patient currently on IV heparin drip mRankin:  Past Medical History  Diagnosis Date  . Hypertension     History reviewed. No pertinent past surgical history.  History reviewed. No pertinent family history. Social History:  reports that she has never smoked. She does not have any smokeless tobacco history on file. She reports that she does not drink alcohol or use illicit drugs.  Allergies: No Known Allergies  No prescriptions prior to admission    ROS: History obtained from chart review.  General ROS: negative for - chills, fatigue, fever, night sweats, weight gain or weight loss Psychological ROS: negative for - behavioral disorder, hallucinations, memory difficulties, mood swings or suicidal ideation Ophthalmic ROS: negative for - blurry vision, double vision, eye pain or loss of vision ENT ROS: negative for - epistaxis, nasal discharge, oral lesions, sore throat, tinnitus or vertigo Allergy and Immunology ROS: negative for - hives or itchy/watery eyes Hematological and Lymphatic ROS: negative for - bleeding problems, bruising or swollen lymph nodes Endocrine ROS: negative for - galactorrhea, hair pattern changes, polydipsia/polyuria or temperature intolerance Respiratory ROS: As noted in present illness; viral illness for 6 days prior to admission Cardiovascular  ROS: negative for - chest pain, dyspnea on exertion, edema or irregular heartbeat Gastrointestinal ROS: negative for - abdominal pain, diarrhea, hematemesis, nausea/vomiting or stool incontinence Genito-Urinary ROS: negative for - dysuria, hematuria, incontinence or urinary frequency/urgency Musculoskeletal ROS: negative for - joint swelling or muscular weakness Neurological ROS: as noted in HPI Dermatological ROS: negative for rash and skin lesion changes  Physical Examination: Blood pressure 115/71, pulse 71, temperature 97.5 F (36.4 C), temperature source Axillary, resp. rate 26, height 4' 11" (1.499 m), weight 49.6 kg (109 lb 5.6 oz), SpO2 100 %.  HEENT-  Normocephalic, no lesions, without obvious abnormality.  Normal external eye and conjunctiva.  Normal TM's bilaterally.  Normal auditory canals and external ears. Normal external nose, mucus membranes and septum.  Normal pharynx. Neck supple with no masses, nodes, nodules or enlargement. Cardiovascular - regular rate and rhythm, S1, S2 normal, no murmur, click, rub or gallop Lungs - Tachypneic, on BiPAP, bilateral crackles Abdomen - soft, non-tender; bowel sounds normal; no masses,  no organomegaly Extremities - no joint deformities, effusion, or inflammation, no edema and no skin discoloration  Neurologic Examination: Mental Status: Alert, oriented, thought content appropriate.  Speech fluent without evidence of aphasia. Able to follow commands without difficulty. Cranial Nerves: II-Visual fields were normal. III/IV/VI-Pupils were equal and reacted normally to light. Extraocular movements were full and conjugate.    V/VII-no facial numbness and no facial weakness. VIII-normal. X-normal speech. XI: trapezius strength/neck flexion strength normal bilaterally XII-midline tongue extension with normal strength. Motor: 5/5 bilaterally with normal tone and bulk Sensory: Normal throughout. Deep Tendon Reflexes: Trace to 1+ and  symmetric. Plantars: Flexor bilaterally Cerebellar: Normal finger-to-nose testing.  Results for orders placed or performed during the hospital encounter of 08/26/15 (from  the past 48 hour(s))  Comprehensive metabolic panel     Status: Abnormal   Collection Time: 08/26/15  1:06 PM  Result Value Ref Range   Sodium 135 135 - 145 mmol/L   Potassium 3.5 3.5 - 5.1 mmol/L   Chloride 100 (L) 101 - 111 mmol/L   CO2 23 22 - 32 mmol/L   Glucose, Bld 366 (H) 65 - 99 mg/dL   BUN 16 6 - 20 mg/dL   Creatinine, Ser 1.14 (H) 0.44 - 1.00 mg/dL   Calcium 9.2 8.9 - 10.3 mg/dL   Total Protein 7.6 6.5 - 8.1 g/dL   Albumin 3.3 (L) 3.5 - 5.0 g/dL   AST 42 (H) 15 - 41 U/L   ALT 21 14 - 54 U/L   Alkaline Phosphatase 100 38 - 126 U/L   Total Bilirubin 0.2 (L) 0.3 - 1.2 mg/dL   GFR calc non Af Amer 45 (L) >60 mL/min   GFR calc Af Amer 52 (L) >60 mL/min    Comment: (NOTE) The eGFR has been calculated using the CKD EPI equation. This calculation has not been validated in all clinical situations. eGFR's persistently <60 mL/min signify possible Chronic Kidney Disease.    Anion gap 12 5 - 15  CBC WITH DIFFERENTIAL     Status: Abnormal   Collection Time: 08/26/15  1:06 PM  Result Value Ref Range   WBC 11.1 (H) 4.0 - 10.5 K/uL   RBC 5.52 (H) 3.87 - 5.11 MIL/uL   Hemoglobin 13.0 12.0 - 15.0 g/dL   HCT 40.4 36.0 - 46.0 %   MCV 73.2 (L) 78.0 - 100.0 fL   MCH 23.6 (L) 26.0 - 34.0 pg   MCHC 32.2 30.0 - 36.0 g/dL   RDW 16.0 (H) 11.5 - 15.5 %   Platelets 291 150 - 400 K/uL    Comment: PLATELET COUNT CONFIRMED BY SMEAR RESULT REPEATED AND VERIFIED    Neutrophils Relative % 69 %   Neutro Abs 7.7 1.7 - 7.7 K/uL   Lymphocytes Relative 26 %   Lymphs Abs 2.9 0.7 - 4.0 K/uL   Monocytes Relative 3 %   Monocytes Absolute 0.3 0.1 - 1.0 K/uL   Eosinophils Relative 1 %   Eosinophils Absolute 0.1 0.0 - 0.7 K/uL   Basophils Relative 0 %   Basophils Absolute 0.0 0.0 - 0.1 K/uL  Troponin I     Status: Abnormal    Collection Time: 08/26/15  1:06 PM  Result Value Ref Range   Troponin I 0.09 (H) <0.031 ng/mL    Comment:        PERSISTENTLY INCREASED TROPONIN VALUES IN THE RANGE OF 0.04-0.49 ng/mL CAN BE SEEN IN:       -UNSTABLE ANGINA       -CONGESTIVE HEART FAILURE       -MYOCARDITIS       -CHEST TRAUMA       -ARRYHTHMIAS       -LATE PRESENTING MYOCARDIAL INFARCTION       -COPD   CLINICAL FOLLOW-UP RECOMMENDED.   Brain natriuretic peptide     Status: Abnormal   Collection Time: 08/26/15  1:07 PM  Result Value Ref Range   B Natriuretic Peptide 451.8 (H) 0.0 - 100.0 pg/mL  I-Stat arterial blood gas, ED  ([facility], [facility])     Status: Abnormal   Collection Time: 08/26/15  1:25 PM  Result Value Ref Range   pH, Arterial 7.144 (LL) 7.350 - 7.450   pCO2 arterial 71.7 (HH) 35.0 -  45.0 mmHg   pO2, Arterial 79.0 (L) 80.0 - 100.0 mmHg   Bicarbonate 24.6 (H) 20.0 - 24.0 mEq/L   TCO2 27 0 - 100 mmol/L   O2 Saturation 90.0 %   Acid-base deficit 6.0 (H) 0.0 - 2.0 mmol/L   Patient temperature HIDE    Collection site RADIAL, ALLEN'S TEST ACCEPTABLE    Drawn by Operator    Sample type ARTERIAL    Comment NOTIFIED PHYSICIAN   I-stat chem 8, ED  (not at [facility])     Status: Abnormal   Collection Time: 08/26/15  1:28 PM  Result Value Ref Range   Sodium 139 135 - 145 mmol/L   Potassium 3.5 3.5 - 5.1 mmol/L   Chloride 104 101 - 111 mmol/L   BUN 21 (H) 6 - 20 mg/dL   Creatinine, Ser 1.10 (H) 0.44 - 1.00 mg/dL   Glucose, Bld 371 (H) 65 - 99 mg/dL   Calcium, Ion 1.19 1.13 - 1.30 mmol/L   TCO2 23 0 - 100 mmol/L   Hemoglobin 15.6 (H) 12.0 - 15.0 g/dL   HCT 46.0 36.0 - 46.0 %  I-Stat arterial blood gas, ED     Status: Abnormal   Collection Time: 08/26/15  2:42 PM  Result Value Ref Range   pH, Arterial 7.299 (L) 7.350 - 7.450   pCO2 arterial 46.2 (H) 35.0 - 45.0 mmHg   pO2, Arterial 156.0 (H) 80.0 - 100.0 mmHg   Bicarbonate 22.7 20.0 - 24.0 mEq/L   TCO2 24 0 - 100 mmol/L   O2 Saturation 99.0 %   Acid-base  deficit 4.0 (H) 0.0 - 2.0 mmol/L   Patient temperature HIDE    Collection site RADIAL, ALLEN'S TEST ACCEPTABLE    Drawn by Operator    Sample type ARTERIAL   Urinalysis, Routine w reflex microscopic (not at [facility])     Status: Abnormal   Collection Time: 08/26/15  3:50 PM  Result Value Ref Range   Color, Urine YELLOW YELLOW   APPearance CLEAR CLEAR   Specific Gravity, Urine >1.030 (H) 1.005 - 1.030   pH 6.0 5.0 - 8.0   Glucose, UA >1000 (A) NEGATIVE mg/dL   Hgb urine dipstick TRACE (A) NEGATIVE   Bilirubin Urine NEGATIVE NEGATIVE   Ketones, ur NEGATIVE NEGATIVE mg/dL   Protein, ur 100 (A) NEGATIVE mg/dL   Urobilinogen, UA 0.2 0.0 - 1.0 mg/dL   Nitrite NEGATIVE NEGATIVE   Leukocytes, UA NEGATIVE NEGATIVE  Urine microscopic-add on     Status: Abnormal   Collection Time: 08/26/15  3:50 PM  Result Value Ref Range   Squamous Epithelial / LPF RARE RARE   WBC, UA 3-6 <3 WBC/hpf   Casts HYALINE CASTS (A) NEGATIVE  Troponin I     Status: Abnormal   Collection Time: 08/26/15  6:50 PM  Result Value Ref Range   Troponin I 0.27 (H) <0.031 ng/mL    Comment:        PERSISTENTLY INCREASED TROPONIN VALUES IN THE RANGE OF 0.04-0.49 ng/mL CAN BE SEEN IN:       -UNSTABLE ANGINA       -CONGESTIVE HEART FAILURE       -MYOCARDITIS       -CHEST TRAUMA       -ARRYHTHMIAS       -LATE PRESENTING MYOCARDIAL INFARCTION       -COPD   CLINICAL FOLLOW-UP RECOMMENDED.   Comprehensive metabolic panel     Status: Abnormal   Collection Time: 08/26/15  7:50  PM  Result Value Ref Range   Sodium 134 (L) 135 - 145 mmol/L   Potassium 3.6 3.5 - 5.1 mmol/L   Chloride 95 (L) 101 - 111 mmol/L   CO2 25 22 - 32 mmol/L   Glucose, Bld 196 (H) 65 - 99 mg/dL   BUN 19 6 - 20 mg/dL   Creatinine, Ser 1.11 (H) 0.44 - 1.00 mg/dL   Calcium 9.0 8.9 - 10.3 mg/dL   Total Protein 7.5 6.5 - 8.1 g/dL   Albumin 3.5 3.5 - 5.0 g/dL   AST 59 (H) 15 - 41 U/L   ALT 30 14 - 54 U/L   Alkaline Phosphatase 94 38 - 126 U/L   Total  Bilirubin 0.3 0.3 - 1.2 mg/dL   GFR calc non Af Amer 46 (L) >60 mL/min   GFR calc Af Amer 54 (L) >60 mL/min    Comment: (NOTE) The eGFR has been calculated using the CKD EPI equation. This calculation has not been validated in all clinical situations. eGFR's persistently <60 mL/min signify possible Chronic Kidney Disease.    Anion gap 14 5 - 15  Magnesium     Status: None   Collection Time: 08/26/15  7:50 PM  Result Value Ref Range   Magnesium 1.8 1.7 - 2.4 mg/dL  Phosphorus     Status: None   Collection Time: 08/26/15  7:50 PM  Result Value Ref Range   Phosphorus 3.9 2.5 - 4.6 mg/dL  CBC     Status: Abnormal   Collection Time: 08/26/15  7:50 PM  Result Value Ref Range   WBC 17.3 (H) 4.0 - 10.5 K/uL   RBC 5.29 (H) 3.87 - 5.11 MIL/uL   Hemoglobin 12.4 12.0 - 15.0 g/dL    Comment: REPEATED TO VERIFY DELTA CHECK NOTED    HCT 38.9 36.0 - 46.0 %   MCV 73.5 (L) 78.0 - 100.0 fL   MCH 23.4 (L) 26.0 - 34.0 pg   MCHC 31.9 30.0 - 36.0 g/dL   RDW 15.9 (H) 11.5 - 15.5 %   Platelets 228 150 - 400 K/uL  Troponin I     Status: Abnormal   Collection Time: 08/26/15  7:50 PM  Result Value Ref Range   Troponin I 0.31 (H) <0.031 ng/mL    Comment:        PERSISTENTLY INCREASED TROPONIN VALUES IN THE RANGE OF 0.04-0.49 ng/mL CAN BE SEEN IN:       -UNSTABLE ANGINA       -CONGESTIVE HEART FAILURE       -MYOCARDITIS       -CHEST TRAUMA       -ARRYHTHMIAS       -LATE PRESENTING MYOCARDIAL INFARCTION       -COPD   CLINICAL FOLLOW-UP RECOMMENDED.   Protime-INR     Status: None   Collection Time: 08/27/15 12:34 AM  Result Value Ref Range   Prothrombin Time 14.5 11.6 - 15.2 seconds   INR 1.11 0.00 - 1.49  APTT     Status: None   Collection Time: 08/27/15 12:34 AM  Result Value Ref Range   aPTT 29 24 - 37 seconds   Dg Chest Port 1 View  08/26/2015   CLINICAL DATA:  80 year old female central line placement. Initial encounter.  EXAM: PORTABLE CHEST 1 VIEW  COMPARISON:  1306 hours today.   FINDINGS: Portable AP semi upright view at 2259 hours. Left IJ central line has been placed. Tip is at the lower SVC level. No  pneumothorax. Stable cardiac size and mediastinal contours. Interval mild regression of widespread patchy in perihilar opacity, improvement is greater in the upper lobes. Residual in the lower lobes. No large pleural effusion.  IMPRESSION: 1. Left IJ central line placed, tip at the lower SVC level with no adverse features. 2. Some interval regression of pulmonary edema, residual in the lower lungs.   Electronically Signed   By: Genevie Ann M.D.   On: 08/26/2015 23:06   Dg Chest Portable 1 View  08/26/2015   CLINICAL DATA:  Shortness of breath.  EXAM: PORTABLE CHEST 1 VIEW  COMPARISON:  None.  FINDINGS: Borderline cardiomegaly is noted. No pneumothorax or significant pleural effusion is noted. Diffuse patchy airspace opacities are noted throughout both lungs concerning for pneumonia or edema. Bony thorax is unremarkable.  IMPRESSION: Diffuse patchy bilateral airspace opacities are noted concerning for pneumonia or edema.   Electronically Signed   By: Marijo Conception, M.D.   On: 08/26/2015 13:24    Assessment: 80 y.o. female transient left hemiparesthesias following an episode of hypotension. Acute right cerebral infarction, possibly watershed in distribution, will need to be ruled out.  Stroke Risk Factors - hypertension  Plan: 1. HgbA1c, fasting lipid panel 2. MRI, MRA  of the brain without contrast 3. PT consult, OT consult, Speech consult 4. Echocardiogram 5. Carotid dopplers 6. Prophylactic therapy-Antiplatelet med: Aspirin  7. Risk factor modification 8. Telemetry monitoring  C.R. Nicole Kindred, MD [facility] [phone]  08/27/2015, 1:46 AM

## 2015-08-27 NOTE — Interval H&P Note (Signed)
Cath Lab Visit (complete for each Cath Lab visit)  Clinical Evaluation Leading to the Procedure:   ACS: Yes.    Non-ACS:    Anginal Classification: CCS III  Anti-ischemic medical therapy: Minimal Therapy (1 class of medications)  Non-Invasive Test Results: No non-invasive testing performed  Prior CABG: No previous CABG      History and Physical Interval Note:  08/27/2015 12:54 PM  Julie Stein  has presented today for surgery, with the diagnosis of hf  The various methods of treatment have been discussed with the patient and family. After consideration of risks, benefits and other options for treatment, the patient has consented to  Procedure(s): Right/Left Heart Cath and Coronary Angiography (N/A) as a surgical intervention .  The patient's history has been reviewed, patient examined, no change in status, stable for surgery.  I have reviewed the patient's chart and labs.  Questions were answered to the patient's satisfaction.     Julie Stein [employer]

## 2015-08-27 NOTE — Progress Notes (Addendum)
Patient ID: Julie Stein, female   DOB: 11/23/1934, 80 y.o.   MRN: [id number]   SUBJECTIVE: Breathing is improving.  No residual foot weakness.    Scheduled Meds: . antiseptic oral rinse  7 mL Mouth Rinse q12n4p  . aspirin EC  81 mg Oral Daily  . chlorhexidine  15 mL Mouth Rinse BID  . furosemide  40 mg Intravenous 3 times per day  . insulin aspart  0-15 Units Subcutaneous 6 times per day  . isosorbide-hydrALAZINE  1 tablet Oral TID  . potassium chloride  40 mEq Oral Once  . sodium chloride  3 mL Intravenous Q12H   Continuous Infusions: . amiodarone Stopped (08/27/15 0015)  . heparin 500 Units/hr (08/27/15 0301)  . nitroPRUSSide Stopped (08/27/15 0015)  . phenylephrine (NEO-SYNEPHRINE) Adult infusion     PRN Meds:.Place/Maintain arterial line **AND** sodium chloride    Filed Vitals:   08/27/15 0400 08/27/15 0500 08/27/15 0519 08/27/15 0600  BP: 132/74 114/71  137/69  Pulse: 76 77 97 82  Temp: 98.7 F (37.1 C)     TempSrc: Axillary     Resp: 33 30 30 24  Height:      Weight:      SpO2: 100% 100% 99% 96%    Intake/Output Summary (Last 24 hours) at 08/27/15 0739 Last data filed at 08/27/15 0600  Gross per 24 hour  Intake  549.3 ml  Output   1600 ml  Net -1050.7 ml    LABS: Basic Metabolic Panel:  Recent Labs  08/26/15 1306 08/26/15 1328 08/26/15 1950  NA 135 139 134*  K 3.5 3.5 3.6  CL 100* 104 95*  CO2 23  --  25  GLUCOSE 366* 371* 196*  BUN 16 21* 19  CREATININE 1.14* 1.10* 1.11*  CALCIUM 9.2  --  9.0  MG  --   --  1.8  PHOS  --   --  3.9   Liver Function Tests:  Recent Labs  08/26/15 1306 08/26/15 1950  AST 42* 59*  ALT 21 30  ALKPHOS 100 94  BILITOT 0.2* 0.3  PROT 7.6 7.5  ALBUMIN 3.3* 3.5   No results for input(s): LIPASE, AMYLASE in the last 72 hours. CBC:  Recent Labs  08/26/15 1306  08/26/15 1950 08/27/15 0700  WBC 11.1*  --  17.3* 17.5*  NEUTROABS 7.7  --   --   --   HGB 13.0  < > 12.4 10.9*  HCT 40.4  < > 38.9 33.1*  MCV  73.2*  --  73.5* 71.0*  PLT 291  --  228 226  < > = values in this interval not displayed. Cardiac Enzymes:  Recent Labs  08/26/15 1850 08/26/15 1950 08/27/15 0230  TROPONINI 0.27* 0.31* 0.29*   BNP: Invalid input(s): POCBNP D-Dimer: No results for input(s): DDIMER in the last 72 hours. Hemoglobin A1C: No results for input(s): HGBA1C in the last 72 hours. Fasting Lipid Panel: No results for input(s): CHOL, HDL, LDLCALC, TRIG, CHOLHDL, LDLDIRECT in the last 72 hours. Thyroid Function Tests:  Recent Labs  08/27/15 0034  TSH 1.927   Anemia Panel: No results for input(s): VITAMINB12, FOLATE, FERRITIN, TIBC, IRON, RETICCTPCT in the last 72 hours.  RADIOLOGY: Ct Head Wo Contrast  08/27/2015   CLINICAL DATA:  Initial evaluation for acute unresponsiveness. , left-sided weakness, now resolved.  EXAM: CT HEAD WITHOUT CONTRAST  TECHNIQUE: Contiguous axial images were obtained from the base of the skull through the vertex without intravenous contrast.  COMPARISON:    None.  FINDINGS: Age-related cerebral volume loss present. Patchy and confluent hypodensity within the periventricular and deep white matter both cerebral hemispheres most consistent with chronic small vessel ischemic disease. Scattered vascular calcifications within the carotid siphons and distal right vertebral artery.  No acute large vessel territory infarct. Gray-white matter differentiation grossly maintained. Deep gray nuclei are symmetric. No acute intracranial hemorrhage. No mass lesion, midline shift, or mass effect. No hydrocephalus. No extra-axial fluid collection.  Scalp soft tissues within normal limits. No acute abnormality about the orbits.  Paranasal sinuses mastoid air cells are clear.  Calvarium intact.  IMPRESSION: 1. No acute intracranial process. 2. Age-related cerebral atrophy with chronic small vessel ischemic disease.   Electronically Signed   By: Rise Mu M.D.   On: 08/27/2015 02:31   Dg Chest  Port 1 View  08/26/2015   CLINICAL DATA:  80 year old female central line placement. Initial encounter.  EXAM: PORTABLE CHEST 1 VIEW  COMPARISON:  1306 hours today.  FINDINGS: Portable AP semi upright view at 2259 hours. Left IJ central line has been placed. Tip is at the lower SVC level. No pneumothorax. Stable cardiac size and mediastinal contours. Interval mild regression of widespread patchy in perihilar opacity, improvement is greater in the upper lobes. Residual in the lower lobes. No large pleural effusion.  IMPRESSION: 1. Left IJ central line placed, tip at the lower SVC level with no adverse features. 2. Some interval regression of pulmonary edema, residual in the lower lungs.   Electronically Signed   By: Odessa Fleming M.D.   On: 08/26/2015 23:06   Dg Chest Portable 1 View  08/26/2015   CLINICAL DATA:  Shortness of breath.  EXAM: PORTABLE CHEST 1 VIEW  COMPARISON:  None.  FINDINGS: Borderline cardiomegaly is noted. No pneumothorax or significant pleural effusion is noted. Diffuse patchy airspace opacities are noted throughout both lungs concerning for pneumonia or edema. Bony thorax is unremarkable.  IMPRESSION: Diffuse patchy bilateral airspace opacities are noted concerning for pneumonia or edema.   Electronically Signed   By: Lupita Raider, M.D.   On: 08/26/2015 13:24    PHYSICAL EXAM General: NAD Neck: JVP 8-9 cm, no thyromegaly or thyroid nodule.  Lungs: Crackles at bases bilaterally.  CV: Nondisplaced PMI.  Heart regular S1/S2, no S3/S4, no murmur.  No peripheral edema.   Abdomen: Soft, nontender, no hepatosplenomegaly, no distention.  Neurologic: Alert and oriented x 3, muscle strength grossly normal in all major muscle groups.   Psych: Normal affect. Extremities: No clubbing or cyanosis.   TELEMETRY: Reviewed telemetry pt in NSR with occasional PVCs  ASSESSMENT AND PLAN: 80 yo with history of HTN on no meds prior to admission was admitted with acute dyspnea and mild chest pain.   Noted to have acute respiratory failure, initially requiring Bipap.  CXR with pulmonary edema and echo with EF 20-25%, diffuse hypokinesis.  1. Acute systolic CHF: No prior echo.  Echo done with EF 20-25%, mild LVH, diffuse hypokinesis.  Pulmonary edema on CXR.  Hypertensive, no meds at home.  Had viral-type prodrome preceding this admission.  Cause of cardiomyopathy uncertain => viral myocarditis versus ischemic cardiomyopathy versus hypertensive cardiomyopathy.  Still some volume overload, diuresing with Lasix.  - Continue Lasix 40 mg IV every 8 hours.  - NTG gtt for now to keep SBP < 150.  - Add Bidil 1 tab tid.  - Beta blocker when pulmonary edema resolved, ACEI after cath.  - Will need right and left heart cath to  define presence/absence of coronary disease as a cause of her symptoms.  We discussed the risks/benefits of this procedure today and she agrees to proceed.  2. HTN: Hypertensive emergency with pulmonary edema and admission.  SBP in 190s currently.  Will restart NTG gtt and add Bidil as above.  No CVA on CT head, no residual neuro deficits. Careful not to excessively lower BP, will aim for SBP around 140-150 for now.  3. TIA: Possible TIA with left foot weakness.  This has completely resolved.  CT head w/o CVA or bleed.  Plan for MRI head.  She is on aspirin.  Avoid large fall in BP, as above aim for SBP 140-150 for now.  4. Elevated troponin: Mild elevation, maximal 0.31.  Possibly demand ischemia in the setting of CHF/volume overload.  Continue ASA, coronary angiography today.  5. ID: No definite evidence for infection, possible viral illness leading up to this presentation, sent coxsackievirus titers. 6. SVT: No further SVT this morning, now off amiodarone.  Will continue to follow telemetry.   35 minutes critical care time.   Marca Ancona 08/27/2015 7:50 AM

## 2015-08-28 ENCOUNTER — Inpatient Hospital Stay (HOSPITAL_COMMUNITY): Payer: Medicare Other

## 2015-08-28 ENCOUNTER — Encounter (HOSPITAL_COMMUNITY): Payer: Self-pay | Admitting: Cardiology

## 2015-08-28 DIAGNOSIS — E785 Hyperlipidemia, unspecified: Secondary | ICD-10-CM

## 2015-08-28 DIAGNOSIS — I161 Hypertensive emergency: Secondary | ICD-10-CM

## 2015-08-28 DIAGNOSIS — I6340 Cerebral infarction due to embolism of unspecified cerebral artery: Secondary | ICD-10-CM

## 2015-08-28 DIAGNOSIS — I5021 Acute systolic (congestive) heart failure: Secondary | ICD-10-CM

## 2015-08-28 LAB — RESPIRATORY VIRUS PANEL
Adenovirus: NEGATIVE
INFLUENZA A: NEGATIVE
Influenza B: NEGATIVE
Metapneumovirus: NEGATIVE
PARAINFLUENZA 2 A: NEGATIVE
Parainfluenza 1: NEGATIVE
Parainfluenza 3: NEGATIVE
RESPIRATORY SYNCYTIAL VIRUS A: NEGATIVE
RESPIRATORY SYNCYTIAL VIRUS B: NEGATIVE
RHINOVIRUS: NEGATIVE

## 2015-08-28 LAB — CBC WITH DIFFERENTIAL/PLATELET
Basophils Absolute: 0 10*3/uL (ref 0.0–0.1)
Basophils Relative: 0 %
EOS PCT: 1 %
Eosinophils Absolute: 0.1 10*3/uL (ref 0.0–0.7)
HCT: 27.8 % — ABNORMAL LOW (ref 36.0–46.0)
Hemoglobin: 9.1 g/dL — ABNORMAL LOW (ref 12.0–15.0)
LYMPHS ABS: 1.1 10*3/uL (ref 0.7–4.0)
LYMPHS PCT: 9 %
MCH: 23.4 pg — AB (ref 26.0–34.0)
MCHC: 32.7 g/dL (ref 30.0–36.0)
MCV: 71.5 fL — AB (ref 78.0–100.0)
MONO ABS: 0.6 10*3/uL (ref 0.1–1.0)
Monocytes Relative: 5 %
Neutro Abs: 9.9 10*3/uL — ABNORMAL HIGH (ref 1.7–7.7)
Neutrophils Relative %: 85 %
PLATELETS: 197 10*3/uL (ref 150–400)
RBC: 3.89 MIL/uL (ref 3.87–5.11)
RDW: 16.1 % — ABNORMAL HIGH (ref 11.5–15.5)
WBC: 11.6 10*3/uL — ABNORMAL HIGH (ref 4.0–10.5)

## 2015-08-28 LAB — GLUCOSE, CAPILLARY
Glucose-Capillary: 122 mg/dL — ABNORMAL HIGH (ref 65–99)
Glucose-Capillary: 118 mg/dL — ABNORMAL HIGH (ref 65–99)
Glucose-Capillary: 105 mg/dL — ABNORMAL HIGH (ref 65–99)
Glucose-Capillary: 124 mg/dL — ABNORMAL HIGH (ref 65–99)
Glucose-Capillary: 138 mg/dL — ABNORMAL HIGH (ref 65–99)
Glucose-Capillary: 130 mg/dL — ABNORMAL HIGH (ref 65–99)

## 2015-08-28 LAB — POCT ACTIVATED CLOTTING TIME: Activated Clotting Time: 135 seconds

## 2015-08-28 LAB — POCT I-STAT 3, VENOUS BLOOD GAS (G3P V)
Acid-base deficit: 1 mmol/L (ref 0.0–2.0)
Bicarbonate: 24.9 mEq/L — ABNORMAL HIGH (ref 20.0–24.0)
Bicarbonate: 25.9 mEq/L — ABNORMAL HIGH (ref 20.0–24.0)
O2 Saturation: 69 %
O2 Saturation: 71 %
TCO2: 26 mmol/L (ref 0–100)
TCO2: 27 mmol/L (ref 0–100)
pCO2, Ven: 43.5 mmHg — ABNORMAL LOW (ref 45.0–50.0)
pCO2, Ven: 45.4 mmHg (ref 45.0–50.0)
pH, Ven: 7.364 — ABNORMAL HIGH (ref 7.250–7.300)
pH, Ven: 7.364 — ABNORMAL HIGH (ref 7.250–7.300)
pO2, Ven: 37 mmHg (ref 30.0–45.0)
pO2, Ven: 39 mmHg (ref 30.0–45.0)

## 2015-08-28 LAB — BASIC METABOLIC PANEL
Anion gap: 7 (ref 5–15)
Anion gap: 9 (ref 5–15)
BUN: 20 mg/dL (ref 6–20)
BUN: 17 mg/dL (ref 6–20)
CHLORIDE: 104 mmol/L (ref 101–111)
CO2: 27 mmol/L (ref 22–32)
CO2: 26 mmol/L (ref 22–32)
Calcium: 8.6 mg/dL — ABNORMAL LOW (ref 8.9–10.3)
Calcium: 9 mg/dL (ref 8.9–10.3)
Chloride: 102 mmol/L (ref 101–111)
Creatinine, Ser: 1.15 mg/dL — ABNORMAL HIGH (ref 0.44–1.00)
Creatinine, Ser: 1.05 mg/dL — ABNORMAL HIGH (ref 0.44–1.00)
GFR calc Af Amer: 57 mL/min — ABNORMAL LOW (ref >60)
GFR calc non Af Amer: 50 mL/min — ABNORMAL LOW (ref >60)
GFR, EST AFRICAN AMERICAN: 51 mL/min — AB (ref >60)
GFR, EST NON AFRICAN AMERICAN: 44 mL/min — AB (ref >60)
GLUCOSE: 135 mg/dL — AB (ref 65–99)
Glucose, Bld: 111 mg/dL — ABNORMAL HIGH (ref 65–99)
POTASSIUM: 3.6 mmol/L (ref 3.5–5.1)
POTASSIUM: 3.3 mmol/L — AB (ref 3.5–5.1)
SODIUM: 136 mmol/L (ref 135–145)
Sodium: 139 mmol/L (ref 135–145)

## 2015-08-28 LAB — CBC
HEMATOCRIT: 26.4 % — AB (ref 36.0–46.0)
Hemoglobin: 8.6 g/dL — ABNORMAL LOW (ref 12.0–15.0)
MCH: 23.2 pg — ABNORMAL LOW (ref 26.0–34.0)
MCHC: 32.6 g/dL (ref 30.0–36.0)
MCV: 71.2 fL — ABNORMAL LOW (ref 78.0–100.0)
PLATELETS: 189 10*3/uL (ref 150–400)
RBC: 3.71 MIL/uL — AB (ref 3.87–5.11)
RDW: 16 % — AB (ref 11.5–15.5)
WBC: 11.4 10*3/uL — AB (ref 4.0–10.5)

## 2015-08-28 LAB — HEMOGLOBIN A1C
Hgb A1c MFr Bld: 6.2 % — ABNORMAL HIGH (ref 4.8–5.6)
Mean Plasma Glucose: 131 mg/dL

## 2015-08-28 LAB — MAGNESIUM: MAGNESIUM: 1.8 mg/dL (ref 1.7–2.4)

## 2015-08-28 LAB — PHOSPHORUS: Phosphorus: 2.6 mg/dL (ref 2.5–4.6)

## 2015-08-28 LAB — CMV IGM: CMV IgM: <30 AU/mL (ref 0.0–29.9)

## 2015-08-28 LAB — HIV ANTIBODY (ROUTINE TESTING W REFLEX): HIV SCREEN 4TH GENERATION: NONREACTIVE

## 2015-08-28 MED ORDER — AZITHROMYCIN 500 MG IV SOLR
500.0000 mg | INTRAVENOUS | Status: DC
  Administered 2015-08-28: 500 mg via INTRAVENOUS
  Filled 2015-08-28 (×2): qty 500

## 2015-08-28 MED ORDER — CARVEDILOL 12.5 MG PO TABS: 12.5000 mg | ORAL_TABLET | Freq: Two times a day (BID) | ORAL | Status: DC

## 2015-08-28 MED ORDER — SACUBITRIL-VALSARTAN 24-26 MG PO TABS
1.0000 | ORAL_TABLET | Freq: Two times a day (BID) | ORAL | Status: DC
  Administered 2015-08-28 – 2015-08-29 (×3): 1 via ORAL
  Filled 2015-08-28 (×4): qty 1

## 2015-08-28 MED ORDER — ASPIRIN EC 325 MG PO TBEC
325.0000 mg | DELAYED_RELEASE_TABLET | Freq: Every day | ORAL | Status: DC
  Administered 2015-08-29: 325 mg via ORAL
  Filled 2015-08-28: qty 1

## 2015-08-28 MED ORDER — POTASSIUM CHLORIDE CRYS ER 20 MEQ PO TBCR
40.0000 meq | EXTENDED_RELEASE_TABLET | Freq: Once | ORAL | Status: AC
  Administered 2015-08-28: 40 meq via ORAL
  Filled 2015-08-28: qty 2

## 2015-08-28 MED ORDER — CARVEDILOL 12.5 MG PO TABS
12.5000 mg | ORAL_TABLET | Freq: Two times a day (BID) | ORAL | Status: DC
  Administered 2015-08-28 – 2015-08-29 (×4): 12.5 mg via ORAL
  Filled 2015-08-28 (×4): qty 1

## 2015-08-28 NOTE — Consult Note (Signed)
PULMONARY / CRITICAL CARE MEDICINE   Name: Julie Stein MRN: [id number] DOB: 12-02-1934    ADMISSION DATE:  08/26/2015 CONSULTATION DATE:  10/9  REFERRING MD :  Delton See (Cardiology)   CHIEF COMPLAINT:  Respiratory failure   INITIAL PRESENTATION: 80yo female never smoker with hx HTN who presented 10/9 with sudden onset SOB after 1 week of viral s/s including cough, congestion, diarrhea.  In ER CXR revealed significant pulmonary edema and bedside echo showed EF 20-25% with diffuse hypokinesis.  Pt placed on bipap in ER and admitted to ICU per cards, PCCM consulted.   STUDIES:  2D echo 10/9>>> severely reduced systolic function with EF 20-25%, diffuse hypokinesis, grade 1 diastolic dysfunction, small organized pericardial effusion Ct head 10/9- neg MRI brain 10/10>>>watershed infarcts Cath 10/10>>>  SIGNIFICANT EVENTS: 10/9 -chang ein MS resolved 10/10- improved resp status   SUBJECTIVE: no distress, awake laert  VITAL SIGNS: Temp:  [97.9 F (36.6 C)-98.7 F (37.1 C)] 98.7 F (37.1 C) (10/11 0736) Pulse Rate:  [0-163] 97 (10/11 1000) Resp:  [0-31] 25 (10/11 1000) BP: (97-176)/(50-107) 171/93 mmHg (10/11 0850) SpO2:  [0 %-100 %] 96 % (10/11 1000) Arterial Line BP: (134-185)/(53-92) 176/84 mmHg (10/11 1000) HEMODYNAMICS: CVP:  [1 mmHg-5 mmHg] 4 mmHg VENTILATOR SETTINGS:   INTAKE / OUTPUT:  Intake/Output Summary (Last 24 hours) at 08/28/15 1212 Last data filed at 08/28/15 1002  Gross per 24 hour  Intake 1322.68 ml  Output   1000 ml  Net 322.68 ml    PHYSICAL EXAMINATION: General:  Thin female, no distress Neuro:  Awake, alert, appropriate, no weakness, no droop  HEENT:  Mm dry Cardiovascular:  s1s2 NSR frequent PAC's  Lungs: CTA , reduced bases Abdomen:  Soft, non tender, +bs  Musculoskeletal:  Warm and dry, scant BLE edema   LABS:  CBC  Recent Labs Lab 08/26/15 1950 08/27/15 0700 08/28/15 0530  WBC 17.3* 17.5* 11.4*  HGB 12.4 10.9* 8.6*  HCT 38.9 33.1*  26.4*  PLT 228 226 189   Coag's  Recent Labs Lab 08/27/15 0034  APTT 29  INR 1.11   BMET  Recent Labs Lab 08/26/15 1950 08/27/15 1725 08/28/15 0530 08/28/15 0930  NA 134*  --  136 139  K 3.6  --  3.6 3.3*  CL 95*  --  102 104  CO2 25  --  27 26  BUN 19  --  20 17  CREATININE 1.11* 1.35* 1.15* 1.05*  GLUCOSE 196*  --  111* 135*   Electrolytes  Recent Labs Lab 08/26/15 1950 08/28/15 0530 08/28/15 0930  CALCIUM 9.0 8.6* 9.0  MG 1.8 1.8  --   PHOS 3.9 2.6  --    Sepsis Markers No results for input(s): LATICACIDVEN, PROCALCITON, O2SATVEN in the last 168 hours. ABG  Recent Labs Lab 08/26/15 1325 08/26/15 1442  PHART 7.144* 7.299*  PCO2ART 71.7* 46.2*  PO2ART 79.0* 156.0*   Liver Enzymes  Recent Labs Lab 08/26/15 1306 08/26/15 1950  AST 42* 59*  ALT 21 30  ALKPHOS 100 94  BILITOT 0.2* 0.3  ALBUMIN 3.3* 3.5   Cardiac Enzymes  Recent Labs Lab 08/26/15 1950 08/27/15 0230 08/27/15 0700  TROPONINI 0.31* 0.29* 0.22*   Glucose  Recent Labs Lab 08/27/15 1232 08/27/15 1656 08/27/15 2041 08/28/15 0121 08/28/15 0419 08/28/15 0735  GLUCAP 102* 80 123* 122* 118* 105*    Imaging Mr Mra Head Wo Contrast  08/28/2015   CLINICAL DATA:  Initial evaluation for transient left-sided weakness following an episode of  unresponsiveness and hypotension.  EXAM: MRI HEAD WITHOUT CONTRAST  MRA HEAD WITHOUT CONTRAST  TECHNIQUE: Multiplanar, multiecho pulse sequences of the brain and surrounding structures were obtained without intravenous contrast. Angiographic images of the head were obtained using MRA technique without contrast.  COMPARISON:  Prior CT from 08/27/2015.  FINDINGS: MRI HEAD FINDINGS  Diffuse prominence of the CSF containing spaces is compatible with generalized age-related cerebral atrophy. Patchy and confluent T2/FLAIR hyperintensity within the periventricular and deep white matter both cerebral hemispheres most consistent with chronic small vessel  ischemic disease. Small remote lacunar infarct within the periventricular white matter of the left corona radiata.  There is a few small foci of patchy restricted diffusion within the high left frontal lobe and right frontoparietal regions bilaterally (series 4, image 33, 35). Additional tiny punctate cortical infarct more inferiorly within the right occipital lobe (series 4, image 26). Associated signal loss seen on corresponding ADC map. No associated hemorrhage or mass effect. Bodies consistent with small acute ischemic infarcts. Given the distribution of these findings, underlying watershed etiology is favored. Normal intravascular flow voids are maintained.  Additional scattered subcentimeter foci of susceptibility artifact seen in within the peripheral cortices of the bilateral cerebral hemispheres on gradient echo sequence, consistent with small chronic micro hemorrhages. While these could potentially be related underlying hypertension, possible amyloid angiopathy could be considered given their somewhat peripheral distribution.  No mass lesion, midline shift, or mass effect. No hydrocephalus. No extra-axial fluid collection.  Craniocervical junction within normal limits. Mild degenerative spondylolysis noted within the visualized upper cervical spine.  Pituitary gland normal. Thinning of the anterior body of the corpus callosum noted, which may related to remote ischemia.  No acute abnormality about the orbits. Mild right-sided exophthalmos.  Paranasal sinuses are clear. No mastoid effusion. Inner ear structures within normal limits.  Bone marrow signal intensity within normal limits. No scalp soft tissue abnormality.  MRA HEAD FINDINGS  ANTERIOR CIRCULATION:  Visualized distal cervical segments of the internal carotid arteries are patent with antegrade flow. Petrous segments widely patent. Scattered multi focal atheromatous irregularity within the cavernous and supraclinoid left ICA. There is a  short-segment moderate stenosis at the proximal supraclinoid left ICA (series 5, image 72). Cavernous and supraclinoid right ICA widely patent. Right A1 segment widely patent. Left A1 segment slightly hypoplastic. Anterior communicating artery patent. Atheromatous irregularity within the anterior cerebral arteries bilaterally.  M1 segments widely patent without stenosis or occlusion. Atheromatous irregularity within the left M1 segment. MCA bifurcations within normal limits. MCA branches symmetric bilaterally. Distal small vessel disease present within the MCA branches bilaterally.  POSTERIOR CIRCULATION:  Vertebral arteries patent to the vertebrobasilar junction. Right vertebral artery slightly diminutive. Posterior inferior cerebral arteries not well evaluated on this exam. Basilar artery widely patent. Small left anterior inferior cerebral artery noted. Superior cerebellar arteries patent. Both sub posterior cerebral arteries arise from the basilar artery and are well opacified to their distal aspects. Distal small vessel disease within the PCA branches bilaterally. There is a more focal short-segment moderate stenosis within the proximal right P2 segment (series 505, image 15).  No aneurysm or vascular malformation.  IMPRESSION: MRI HEAD IMPRESSION:  1. Small volume patchy cortical and subcortical ischemic infarcts within the left frontal and right frontoparietal region, with additional tiny cortical infarct within the right occipital lobe. Given the distribution of these infarcts, an underlying watershed etiology is favored. No associated hemorrhage or mass effect. 2. Scattered small chronic micro hemorrhages as above. While these might be related  to chronic underlying hypertension, possible amyloid angiopathy could also be considered given their somewhat peripheral distribution. 3. Atrophy with chronic microvascular ischemic disease.  MRA HEAD IMPRESSION:  1. No large vessel or proximal arterial branch  occlusion identified. 2. Short-segment moderate stenosis within the supraclinoid left ICA. 3. Short-segment moderate stenosis within the proximal right P2 segment. 4. Distal small vessel atheromatous disease within the MCA, ACA, and PCA branches bilaterally   Electronically Signed   By: Rise Mu M.D.   On: 08/28/2015 02:26   Mr Brain Wo Contrast  08/28/2015   CLINICAL DATA:  Initial evaluation for transient left-sided weakness following an episode of unresponsiveness and hypotension.  EXAM: MRI HEAD WITHOUT CONTRAST  MRA HEAD WITHOUT CONTRAST  TECHNIQUE: Multiplanar, multiecho pulse sequences of the brain and surrounding structures were obtained without intravenous contrast. Angiographic images of the head were obtained using MRA technique without contrast.  COMPARISON:  Prior CT from 08/27/2015.  FINDINGS: MRI HEAD FINDINGS  Diffuse prominence of the CSF containing spaces is compatible with generalized age-related cerebral atrophy. Patchy and confluent T2/FLAIR hyperintensity within the periventricular and deep white matter both cerebral hemispheres most consistent with chronic small vessel ischemic disease. Small remote lacunar infarct within the periventricular white matter of the left corona radiata.  There is a few small foci of patchy restricted diffusion within the high left frontal lobe and right frontoparietal regions bilaterally (series 4, image 33, 35). Additional tiny punctate cortical infarct more inferiorly within the right occipital lobe (series 4, image 26). Associated signal loss seen on corresponding ADC map. No associated hemorrhage or mass effect. Bodies consistent with small acute ischemic infarcts. Given the distribution of these findings, underlying watershed etiology is favored. Normal intravascular flow voids are maintained.  Additional scattered subcentimeter foci of susceptibility artifact seen in within the peripheral cortices of the bilateral cerebral hemispheres on  gradient echo sequence, consistent with small chronic micro hemorrhages. While these could potentially be related underlying hypertension, possible amyloid angiopathy could be considered given their somewhat peripheral distribution.  No mass lesion, midline shift, or mass effect. No hydrocephalus. No extra-axial fluid collection.  Craniocervical junction within normal limits. Mild degenerative spondylolysis noted within the visualized upper cervical spine.  Pituitary gland normal. Thinning of the anterior body of the corpus callosum noted, which may related to remote ischemia.  No acute abnormality about the orbits. Mild right-sided exophthalmos.  Paranasal sinuses are clear. No mastoid effusion. Inner ear structures within normal limits.  Bone marrow signal intensity within normal limits. No scalp soft tissue abnormality.  MRA HEAD FINDINGS  ANTERIOR CIRCULATION:  Visualized distal cervical segments of the internal carotid arteries are patent with antegrade flow. Petrous segments widely patent. Scattered multi focal atheromatous irregularity within the cavernous and supraclinoid left ICA. There is a short-segment moderate stenosis at the proximal supraclinoid left ICA (series 5, image 72). Cavernous and supraclinoid right ICA widely patent. Right A1 segment widely patent. Left A1 segment slightly hypoplastic. Anterior communicating artery patent. Atheromatous irregularity within the anterior cerebral arteries bilaterally.  M1 segments widely patent without stenosis or occlusion. Atheromatous irregularity within the left M1 segment. MCA bifurcations within normal limits. MCA branches symmetric bilaterally. Distal small vessel disease present within the MCA branches bilaterally.  POSTERIOR CIRCULATION:  Vertebral arteries patent to the vertebrobasilar junction. Right vertebral artery slightly diminutive. Posterior inferior cerebral arteries not well evaluated on this exam. Basilar artery widely patent. Small left  anterior inferior cerebral artery noted. Superior cerebellar arteries patent. Both sub posterior cerebral  arteries arise from the basilar artery and are well opacified to their distal aspects. Distal small vessel disease within the PCA branches bilaterally. There is a more focal short-segment moderate stenosis within the proximal right P2 segment (series 505, image 15).  No aneurysm or vascular malformation.  IMPRESSION: MRI HEAD IMPRESSION:  1. Small volume patchy cortical and subcortical ischemic infarcts within the left frontal and right frontoparietal region, with additional tiny cortical infarct within the right occipital lobe. Given the distribution of these infarcts, an underlying watershed etiology is favored. No associated hemorrhage or mass effect. 2. Scattered small chronic micro hemorrhages as above. While these might be related to chronic underlying hypertension, possible amyloid angiopathy could also be considered given their somewhat peripheral distribution. 3. Atrophy with chronic microvascular ischemic disease.  MRA HEAD IMPRESSION:  1. No large vessel or proximal arterial branch occlusion identified. 2. Short-segment moderate stenosis within the supraclinoid left ICA. 3. Short-segment moderate stenosis within the proximal right P2 segment. 4. Distal small vessel atheromatous disease within the MCA, ACA, and PCA branches bilaterally   Electronically Signed   By: Rise Mu M.D.   On: 08/28/2015 02:26   Dg Chest Port 1 View  08/28/2015   CLINICAL DATA:  Shortness of breath.  Pulmonary edema.  EXAM: PORTABLE CHEST 1 VIEW  COMPARISON:  08/26/2015.  FINDINGS: Left IJ line in stable position. Cardiomegaly with bilateral pulmonary alveolar infiltrates. Slight improvement. Persistent small pleural effusions. No pneumothorax.  IMPRESSION: 1. Left IJ line in stable position. 2. Cardiomegaly with persistent bilateral pulmonary infiltrates, partial clearing from prior exam. Persistent small  pleural effusions.   Electronically Signed   By: Maisie Fus  Register   On: 08/28/2015 07:18     ASSESSMENT / PLAN:  PULMONARY Acute respiratory failure - r/t pulmonary edema in setting acute systolic heart failure  Respiratory acidosis  Effusions likley P:   IS pcxr better If when upright and ambulation has desat or distress, needs lasix further  CARDIOVASCULAR CVL  Acute systolic heart failure - unclear etiology.  ?? Viral myocarditis v decompensated Hypertensive cardiomyopathy v ischemic cardiomyopathy (less likely) HTN urgency  Pulmonary edema  P:  Diuresis per cards  Ernestine Conrad out Hep off Started entresta  RENAL AKI - mild v CKD (no previous labs)  hypoK P:   F/u chem  k supp kvo May need further lasux, treat ATX , IS  GASTROINTESTINAL No active issue  P:   diet  HEMATOLOGIC Anemia P:  Hep drip off scd  INFECTIOUS Suspect viral illness  Leukocytosis  wil trat atypical P:   U/a  10/9>>>  Consider azithro x 5 days Ensure urine leg Consider serology cmv ign neg ,coxsach pending HIV neg Get flu pending  ENDOCRINE Hyperglycemia - no documented hx DM   P:   SSI  TSh wnl  NEUROLOGIC Watershed infarct P:   I reviewed MRI Per neuro Carotids to assess [place] map 65-70   FAMILY  - Updates:  Pt updated at length 10/10  Will sign off See above abx recs  Mcarthur Rossetti. Tyson Alias, MD, FACP Pgr: [phone] [facility]

## 2015-08-28 NOTE — Progress Notes (Signed)
Received order post cath for "If PCI" however pt did not receive a PCI yesterday. Please reorder "Cardiac Rehab Phase I" if our services are warranted. Thank you. Ethelda Chick CES, ACSM 7:52 AM 08/28/2015

## 2015-08-28 NOTE — Plan of Care (Signed)
Problem: Acute Treatment Outcomes Goal: Neuro exam at baseline or improved Outcome: Completed/Met Date Met:  08/28/15 Patient alert and oriented X4. Patient moving all 4 extremities and following commands. NIH = 0.

## 2015-08-28 NOTE — Progress Notes (Signed)
*  PRELIMINARY RESULTS* Vascular Ultrasound Carotid Duplex (Doppler) has been completed.  Preliminary findings:  Right = 1-39% ICA stenosis. Could not image left carotid due to IJ line. Please reorder if this is still needed once line and bandages are removed.    Farrel Demark, RDMS, RVT  08/28/2015, 10:25 AM

## 2015-08-28 NOTE — Progress Notes (Signed)
[id number] Pt up in chair. Gave CHF booklet to pt and daughter and reviewed zones and importance of low sodium restriction of 2000 mg and fluid restriction. Also reviewed zones and when to call cardiologist with weight gain. Pt has scales at home. Gave low sodium diets. Pt receptive.  Will follow up tomorrow. Luetta Nutting RN BSN 08/28/2015 3:16 PM

## 2015-08-28 NOTE — Progress Notes (Signed)
Patient ID: Julie Stein, female   DOB: 1935-10-31, 79 y.o.   MRN: [id number]   SUBJECTIVE: Breathing back to normal.  CVP 5.  BP remains high, SBP around 180 this morning and still on NTG gtt.  Hemoglobin down this morning but cath site benign and no back pain.   RHC/LHC:  Left Main  Short vessel, no significant disease.      Left Anterior Descending  20% proximal LAD, 40% mid LAD, 90% distal LAD stenoses.     Left Circumflex  There was a large high OM1 with 80% ostial stenosis. The AV LCx was a large, dominant vessel with luminal irregularities.     Right Coronary Artery  Small, nondominant vessel with no significant disease.       Right Heart Pressures RHC Procedural Findings: Hemodynamics (mmHg) RA mean 2 RV 19/6 PA 23/11 PCWP mean 8 LV 128/15 AO 126/68 Oxygen saturations: PA 70% AO 99% Cardiac Output (Fick) 4.95  Cardiac Index (Fick) 3.09     Scheduled Meds: . antiseptic oral rinse  7 mL Mouth Rinse q12n4p  . aspirin EC  81 mg Oral Daily  . atorvastatin  80 mg Oral q1800  . carvedilol  12.5 mg Oral BID WC  . chlorhexidine  15 mL Mouth Rinse BID  . insulin aspart  0-15 Units Subcutaneous 6 times per day  . isosorbide-hydrALAZINE  1 tablet Oral TID  . sacubitril-valsartan  1 tablet Oral BID  . sodium chloride  3 mL Intravenous Q12H  . sodium chloride  3 mL Intravenous Q12H  . sodium chloride  3 mL Intravenous Q12H   Continuous Infusions: . sodium chloride 10 mL/hr at 08/28/15 0600  . amiodarone Stopped (08/27/15 0015)  . nitroGLYCERIN 25 mcg/min (08/28/15 0609)  . nitroPRUSSide Stopped (08/27/15 0015)  . phenylephrine (NEO-SYNEPHRINE) Adult infusion     PRN Meds:.Place/Maintain arterial line **AND** sodium chloride, sodium chloride, sodium chloride, acetaminophen, ondansetron (ZOFRAN) IV, sodium chloride, sodium chloride    Filed Vitals:   08/28/15 0600 08/28/15 0610 08/28/15 0700 08/28/15 0736  BP: 155/92 150/89 131/88   Pulse: 92 91  99  Temp:     98.7 F (37.1 C)  TempSrc:    Oral  Resp: Height:      Weight:      SpO2: 100% 100%  100%    Intake/Output Summary (Last 24 hours) at 08/28/15 0814 Last data filed at 08/28/15 0600  Gross per 24 hour  Intake 1057.4 ml  Output   1250 ml  Net -192.6 ml    LABS: Basic Metabolic Panel:  Recent Labs  40/98/11 1950 08/27/15 1725 08/28/15 0530  NA 134*  --  136  K 3.6  --  3.6  CL 95*  --  102  CO2 25  --  27  GLUCOSE 196*  --  111*  BUN 19  --  20  CREATININE 1.11* 1.35* 1.15*  CALCIUM 9.0  --  8.6*  MG 1.8  --  1.8  PHOS 3.9  --  2.6   Liver Function Tests:  Recent Labs  08/26/15 1306 08/26/15 1950  AST 42* 59*  ALT 21 30  ALKPHOS 100 94  BILITOT 0.2* 0.3  PROT 7.6 7.5  ALBUMIN 3.3* 3.5   No results for input(s): LIPASE, AMYLASE in the last 72 hours. CBC:  Recent Labs  08/26/15 1306  08/27/15 0700 08/28/15 0530  WBC 11.1*  < > 17.5* 11.4*  NEUTROABS 7.7  --   --   --  HGB 13.0  < > 10.9* 8.6*  HCT 40.4  < > 33.1* 26.4*  MCV 73.2*  < > 71.0* 71.2*  PLT 291  < > 226 189  < > = values in this interval not displayed. Cardiac Enzymes:  Recent Labs  08/26/15 1950 08/27/15 0230 08/27/15 0700  TROPONINI 0.31* 0.29* 0.22*   BNP: Invalid input(s): POCBNP D-Dimer: No results for input(s): DDIMER in the last 72 hours. Hemoglobin A1C:  Recent Labs  08/27/15 0230  HGBA1C 6.2*   Fasting Lipid Panel:  Recent Labs  08/27/15 0600  CHOL 267*  HDL 106  LDLCALC 152*  TRIG 46  CHOLHDL 2.5   Thyroid Function Tests:  Recent Labs  08/27/15 0034  TSH 1.927   Anemia Panel: No results for input(s): VITAMINB12, FOLATE, FERRITIN, TIBC, IRON, RETICCTPCT in the last 72 hours.  RADIOLOGY: Ct Head Wo Contrast  08/27/2015   CLINICAL DATA:  Initial evaluation for acute unresponsiveness. , left-sided weakness, now resolved.  EXAM: CT HEAD WITHOUT CONTRAST  TECHNIQUE: Contiguous axial images were obtained from the base of the skull through  the vertex without intravenous contrast.  COMPARISON:  None.  FINDINGS: Age-related cerebral volume loss present. Patchy and confluent hypodensity within the periventricular and deep white matter both cerebral hemispheres most consistent with chronic small vessel ischemic disease. Scattered vascular calcifications within the carotid siphons and distal right vertebral artery.  No acute large vessel territory infarct. Gray-white matter differentiation grossly maintained. Deep gray nuclei are symmetric. No acute intracranial hemorrhage. No mass lesion, midline shift, or mass effect. No hydrocephalus. No extra-axial fluid collection.  Scalp soft tissues within normal limits. No acute abnormality about the orbits.  Paranasal sinuses mastoid air cells are clear.  Calvarium intact.  IMPRESSION: 1. No acute intracranial process. 2. Age-related cerebral atrophy with chronic small vessel ischemic disease.   Electronically Signed   By: Rise Mu M.D.   On: 08/27/2015 02:31   [name] Wo Contrast  08/28/2015   CLINICAL DATA:  Initial evaluation for transient left-sided weakness following an episode of unresponsiveness and hypotension.  EXAM: MRI HEAD WITHOUT CONTRAST  MRA HEAD WITHOUT CONTRAST  TECHNIQUE: Multiplanar, multiecho pulse sequences of the brain and surrounding structures were obtained without intravenous contrast. Angiographic images of the head were obtained using MRA technique without contrast.  COMPARISON:  Prior CT from 08/27/2015.  FINDINGS: MRI HEAD FINDINGS  Diffuse prominence of the CSF containing spaces is compatible with generalized age-related cerebral atrophy. Patchy and confluent T2/FLAIR hyperintensity within the periventricular and deep white matter both cerebral hemispheres most consistent with chronic small vessel ischemic disease. Small remote lacunar infarct within the periventricular white matter of the left corona radiata.  There is a few small foci of patchy restricted  diffusion within the high left frontal lobe and right frontoparietal regions bilaterally (series 4, image 33, 35). Additional tiny punctate cortical infarct more inferiorly within the right occipital lobe (series 4, image 26). Associated signal loss seen on corresponding ADC map. No associated hemorrhage or mass effect. Bodies consistent with small acute ischemic infarcts. Given the distribution of these findings, underlying watershed etiology is favored. Normal intravascular flow voids are maintained.  Additional scattered subcentimeter foci of susceptibility artifact seen in within the peripheral cortices of the bilateral cerebral hemispheres on gradient echo sequence, consistent with small chronic micro hemorrhages. While these could potentially be related underlying hypertension, possible amyloid angiopathy could be considered given their somewhat peripheral distribution.  No mass lesion, midline shift, or mass  effect. No hydrocephalus. No extra-axial fluid collection.  Craniocervical junction within normal limits. Mild degenerative spondylolysis noted within the visualized upper cervical spine.  Pituitary gland normal. Thinning of the anterior body of the corpus callosum noted, which may related to remote ischemia.  No acute abnormality about the orbits. Mild right-sided exophthalmos.  Paranasal sinuses are clear. No mastoid effusion. Inner ear structures within normal limits.  Bone marrow signal intensity within normal limits. No scalp soft tissue abnormality.  MRA HEAD FINDINGS  ANTERIOR CIRCULATION:  Visualized distal cervical segments of the internal carotid arteries are patent with antegrade flow. Petrous segments widely patent. Scattered multi focal atheromatous irregularity within the cavernous and supraclinoid left ICA. There is a short-segment moderate stenosis at the proximal supraclinoid left ICA (series 5, image 72). Cavernous and supraclinoid right ICA widely patent. Right A1 segment widely patent.  Left A1 segment slightly hypoplastic. Anterior communicating artery patent. Atheromatous irregularity within the anterior cerebral arteries bilaterally.  M1 segments widely patent without stenosis or occlusion. Atheromatous irregularity within the left M1 segment. MCA bifurcations within normal limits. MCA branches symmetric bilaterally. Distal small vessel disease present within the MCA branches bilaterally.  POSTERIOR CIRCULATION:  Vertebral arteries patent to the vertebrobasilar junction. Right vertebral artery slightly diminutive. Posterior inferior cerebral arteries not well evaluated on this exam. Basilar artery widely patent. Small left anterior inferior cerebral artery noted. Superior cerebellar arteries patent. Both sub posterior cerebral arteries arise from the basilar artery and are well opacified to their distal aspects. Distal small vessel disease within the PCA branches bilaterally. There is a more focal short-segment moderate stenosis within the proximal right P2 segment (series 505, image 15).  No aneurysm or vascular malformation.  IMPRESSION: MRI HEAD IMPRESSION:  1. Small volume patchy cortical and subcortical ischemic infarcts within the left frontal and right frontoparietal region, with additional tiny cortical infarct within the right occipital lobe. Given the distribution of these infarcts, an underlying watershed etiology is favored. No associated hemorrhage or mass effect. 2. Scattered small chronic micro hemorrhages as above. While these might be related to chronic underlying hypertension, possible amyloid angiopathy could also be considered given their somewhat peripheral distribution. 3. Atrophy with chronic microvascular ischemic disease.  MRA HEAD IMPRESSION:  1. No large vessel or proximal arterial branch occlusion identified. 2. Short-segment moderate stenosis within the supraclinoid left ICA. 3. Short-segment moderate stenosis within the proximal right P2 segment. 4. Distal small  vessel atheromatous disease within the MCA, ACA, and PCA branches bilaterally   Electronically Signed   By: Rise Mu M.D.   On: 08/28/2015 02:26   Mr Brain Wo Contrast  08/28/2015   CLINICAL DATA:  Initial evaluation for transient left-sided weakness following an episode of unresponsiveness and hypotension.  EXAM: MRI HEAD WITHOUT CONTRAST  MRA HEAD WITHOUT CONTRAST  TECHNIQUE: Multiplanar, multiecho pulse sequences of the brain and surrounding structures were obtained without intravenous contrast. Angiographic images of the head were obtained using MRA technique without contrast.  COMPARISON:  Prior CT from 08/27/2015.  FINDINGS: MRI HEAD FINDINGS  Diffuse prominence of the CSF containing spaces is compatible with generalized age-related cerebral atrophy. Patchy and confluent T2/FLAIR hyperintensity within the periventricular and deep white matter both cerebral hemispheres most consistent with chronic small vessel ischemic disease. Small remote lacunar infarct within the periventricular white matter of the left corona radiata.  There is a few small foci of patchy restricted diffusion within the high left frontal lobe and right frontoparietal regions bilaterally (series 4, image 33, 35).  Additional tiny punctate cortical infarct more inferiorly within the right occipital lobe (series 4, image 26). Associated signal loss seen on corresponding ADC map. No associated hemorrhage or mass effect. Bodies consistent with small acute ischemic infarcts. Given the distribution of these findings, underlying watershed etiology is favored. Normal intravascular flow voids are maintained.  Additional scattered subcentimeter foci of susceptibility artifact seen in within the peripheral cortices of the bilateral cerebral hemispheres on gradient echo sequence, consistent with small chronic micro hemorrhages. While these could potentially be related underlying hypertension, possible amyloid angiopathy could be  considered given their somewhat peripheral distribution.  No mass lesion, midline shift, or mass effect. No hydrocephalus. No extra-axial fluid collection.  Craniocervical junction within normal limits. Mild degenerative spondylolysis noted within the visualized upper cervical spine.  Pituitary gland normal. Thinning of the anterior body of the corpus callosum noted, which may related to remote ischemia.  No acute abnormality about the orbits. Mild right-sided exophthalmos.  Paranasal sinuses are clear. No mastoid effusion. Inner ear structures within normal limits.  Bone marrow signal intensity within normal limits. No scalp soft tissue abnormality.  MRA HEAD FINDINGS  ANTERIOR CIRCULATION:  Visualized distal cervical segments of the internal carotid arteries are patent with antegrade flow. Petrous segments widely patent. Scattered multi focal atheromatous irregularity within the cavernous and supraclinoid left ICA. There is a short-segment moderate stenosis at the proximal supraclinoid left ICA (series 5, image 72). Cavernous and supraclinoid right ICA widely patent. Right A1 segment widely patent. Left A1 segment slightly hypoplastic. Anterior communicating artery patent. Atheromatous irregularity within the anterior cerebral arteries bilaterally.  M1 segments widely patent without stenosis or occlusion. Atheromatous irregularity within the left M1 segment. MCA bifurcations within normal limits. MCA branches symmetric bilaterally. Distal small vessel disease present within the MCA branches bilaterally.  POSTERIOR CIRCULATION:  Vertebral arteries patent to the vertebrobasilar junction. Right vertebral artery slightly diminutive. Posterior inferior cerebral arteries not well evaluated on this exam. Basilar artery widely patent. Small left anterior inferior cerebral artery noted. Superior cerebellar arteries patent. Both sub posterior cerebral arteries arise from the basilar artery and are well opacified to their  distal aspects. Distal small vessel disease within the PCA branches bilaterally. There is a more focal short-segment moderate stenosis within the proximal right P2 segment (series 505, image 15).  No aneurysm or vascular malformation.  IMPRESSION: MRI HEAD IMPRESSION:  1. Small volume patchy cortical and subcortical ischemic infarcts within the left frontal and right frontoparietal region, with additional tiny cortical infarct within the right occipital lobe. Given the distribution of these infarcts, an underlying watershed etiology is favored. No associated hemorrhage or mass effect. 2. Scattered small chronic micro hemorrhages as above. While these might be related to chronic underlying hypertension, possible amyloid angiopathy could also be considered given their somewhat peripheral distribution. 3. Atrophy with chronic microvascular ischemic disease.  MRA HEAD IMPRESSION:  1. No large vessel or proximal arterial branch occlusion identified. 2. Short-segment moderate stenosis within the supraclinoid left ICA. 3. Short-segment moderate stenosis within the proximal right P2 segment. 4. Distal small vessel atheromatous disease within the MCA, ACA, and PCA branches bilaterally   Electronically Signed   By: Rise Mu M.D.   On: 08/28/2015 02:26   Dg Chest Port 1 View  08/28/2015   CLINICAL DATA:  Shortness of breath.  Pulmonary edema.  EXAM: PORTABLE CHEST 1 VIEW  COMPARISON:  08/26/2015.  FINDINGS: Left IJ line in stable position. Cardiomegaly with bilateral pulmonary alveolar infiltrates. Slight improvement. Persistent  small pleural effusions. No pneumothorax.  IMPRESSION: 1. Left IJ line in stable position. 2. Cardiomegaly with persistent bilateral pulmonary infiltrates, partial clearing from prior exam. Persistent small pleural effusions.   Electronically Signed   By: Maisie Fus  Register   On: 08/28/2015 07:18   Dg Chest Port 1 View  08/26/2015   CLINICAL DATA:  80 year old female central line  placement. Initial encounter.  EXAM: PORTABLE CHEST 1 VIEW  COMPARISON:  1306 hours today.  FINDINGS: Portable AP semi upright view at 2259 hours. Left IJ central line has been placed. Tip is at the lower SVC level. No pneumothorax. Stable cardiac size and mediastinal contours. Interval mild regression of widespread patchy in perihilar opacity, improvement is greater in the upper lobes. Residual in the lower lobes. No large pleural effusion.  IMPRESSION: 1. Left IJ central line placed, tip at the lower SVC level with no adverse features. 2. Some interval regression of pulmonary edema, residual in the lower lungs.   Electronically Signed   By: Odessa Fleming M.D.   On: 08/26/2015 23:06   Dg Chest Portable 1 View  08/26/2015   CLINICAL DATA:  Shortness of breath.  EXAM: PORTABLE CHEST 1 VIEW  COMPARISON:  None.  FINDINGS: Borderline cardiomegaly is noted. No pneumothorax or significant pleural effusion is noted. Diffuse patchy airspace opacities are noted throughout both lungs concerning for pneumonia or edema. Bony thorax is unremarkable.  IMPRESSION: Diffuse patchy bilateral airspace opacities are noted concerning for pneumonia or edema.   Electronically Signed   By: Lupita Raider, M.D.   On: 08/26/2015 13:24    PHYSICAL EXAM General: NAD Neck: JVP 7 cm, no thyromegaly or thyroid nodule.  Lungs: Crackles at bases bilaterally.  CV: Nondisplaced PMI.  Heart regular S1/S2, +S3, no murmur.  No peripheral edema.   Abdomen: Soft, nontender, no hepatosplenomegaly, no distention.  Neurologic: Alert and oriented x 3, muscle strength grossly normal in all major muscle groups.   Psych: Normal affect. Extremities: No clubbing or cyanosis. Right groin cath site benign.   TELEMETRY: Reviewed telemetry pt in NSR with occasional PACs  ASSESSMENT AND PLAN: 80 yo with history of HTN on no meds prior to admission was admitted with acute dyspnea and mild chest pain.  Noted to have acute respiratory failure, initially  requiring Bipap.  CXR with pulmonary edema and echo with EF 20-25%, diffuse hypokinesis.  1. Acute systolic CHF: No prior echo.  Echo done with EF 20-25%, mild LVH, diffuse hypokinesis.  Pulmonary edema on CXR.  Hypertensive, no meds at home.  Had viral-type prodrome preceding this admission.  Cause of cardiomyopathy uncertain => viral myocarditis versus hypertensive cardiomyopathy. Not ischemic CMP based on cath yesterday.  Suspect this was flash pulmonary edema in the setting of hypertensive emergency.  Could have also triggered ischemia in distal LAD/OM1 territories based on cath lesions.  CVP 5.  - No Lasix.  - Continue Bidil. - With normal cardiac output, will increase Coreg to 12.5 mg bid.  - Add Entresto 24/26 bid with stable creatinine.   2. HTN: Hypertensive emergency with flash pulmonary edema at admission.  SBP still high.   - Titrate off NTG gtt.  Increasing Coreg and adding Entresto as above.  - Check renal artery dopplers for renal artery stenosis.  3. TIA: Possible TIA with left foot weakness.  This has completely resolved.  CT head w/o CVA or bleed.  MRI head shows small infarcts in watershed region.   - She is on aspirin.   -  Avoid large fall in BP, as above aim for SBP 140 range for now.  4. CAD: Suspect mild troponin elevation was demand ischemia with CHF. She had coronary disease with severe distal LAD stenosis and moderate ostial large OM1 stenosis.  However, this does not explain her cardiomyopathy.  She could have had ischemia in these territories in setting of hypertensive emergency that worsened the flash pulmonary edema. - Medical management of CAD.  - ASA, statin.   5. ID: No definite evidence for infection, possible viral illness leading up to this presentation, sent coxsackievirus titers. 6. SVT: No further SVT, now off amiodarone.  Will continue to follow telemetry.  7. Anemia: Hemoglobin down today post-cath.  No back pain, groin site benign without hematoma.  Will  repeat CBC in afternoon.   35 minutes critical care time.   Marca Ancona 08/28/2015 8:14 AM

## 2015-08-28 NOTE — Progress Notes (Signed)
STROKE TEAM PROGRESS NOTE   SUBJECTIVE (INTERVAL HISTORY) Her daughters are at the bedside. No acute issue overnight. Had cardiac cath yesterday showed multivessel CAD. MRI showed multifocal punctate infarcts consistent with procedure related.    OBJECTIVE Temp:  [97.9 F (36.6 C)-98.9 F (37.2 C)] 98.9 F (37.2 C) (10/11 1200) Pulse Rate:  [0-163] 90 (10/11 1200) Cardiac Rhythm:  [-] Normal sinus rhythm (10/11 0800) Resp:  [0-31] 24 (10/11 1200) BP: (97-176)/(50-97) 130/70 mmHg (10/11 1200) SpO2:  [0 %-100 %] 96 % (10/11 1200) Arterial Line BP: (134-185)/(53-92) 176/84 mmHg (10/11 1000)  CBC:  Recent Labs Lab 08/26/15 1306  08/27/15 0700 08/28/15 0530  WBC 11.1*  < > 17.5* 11.4*  NEUTROABS 7.7  --   --   --   HGB 13.0  < > 10.9* 8.6*  HCT 40.4  < > 33.1* 26.4*  MCV 73.2*  < > 71.0* 71.2*  PLT 291  < > 226 189  < > = values in this interval not displayed.  Basic Metabolic Panel:  Recent Labs Lab 08/26/15 1950  08/28/15 0530 08/28/15 0930  NA 134*  --  136 139  K 3.6  --  3.6 3.3*  CL 95*  --  102 104  CO2 25  --  27 26  GLUCOSE 196*  --  111* 135*  BUN 19  --  20 17  CREATININE 1.11*  < > 1.15* 1.05*  CALCIUM 9.0  --  8.6* 9.0  MG 1.8  --  1.8  --   PHOS 3.9  --  2.6  --   < > = values in this interval not displayed.  Lipid Panel:     Component Value Date/Time   CHOL 267* 08/27/2015 0600   TRIG 46 08/27/2015 0600   HDL 106 08/27/2015 0600   CHOLHDL 2.5 08/27/2015 0600   VLDL 9 08/27/2015 0600   LDLCALC 152* 08/27/2015 0600   HgbA1c:  Lab Results  Component Value Date   HGBA1C 6.2* 08/27/2015   Urine Drug Screen:     Component Value Date/Time   LABOPIA NONE DETECTED 08/27/2015 1215   COCAINSCRNUR NONE DETECTED 08/27/2015 1215   LABBENZ NONE DETECTED 08/27/2015 1215   AMPHETMU NONE DETECTED 08/27/2015 1215   THCU NONE DETECTED 08/27/2015 1215   LABBARB NONE DETECTED 08/27/2015 1215     IMAGING I have personally reviewed the radiological  images below and agree with the radiology interpretations.  Ct Head Wo Contrast  08/27/2015  IMPRESSION: 1. No acute intracranial process. 2. Age-related cerebral atrophy with chronic small vessel ischemic disease.   Electronically Signed   By: Rise Mu M.D.   On: 08/27/2015 02:31   Dg Chest Port 1 View  08/26/2015  IMPRESSION: Diffuse patchy bilateral airspace opacities are noted concerning for pneumonia or edema.    2D Echocardiogram   - Left ventricle: The cavity size was normal. There was mildconcentric hypertrophy. Systolic function was severely reduced.The estimated ejection fraction was in the range of 20% to 25%.Diffuse hypokinesis. Doppler parameters are consistent withabnormal left ventricular relaxation (grade 1 diastolicdysfunction). The E/e&' ratio is >15, suggesting elevated LVfilling pressure. Doppler parameters are consistent with elevatedventricular end-diastolic filling pressure. - Aortic valve: Mildly calcified leaflets. There was no stenosis.There was no regurgitation. - Aortic root: The aortic root was normal in size. - Ascending aorta: The ascending aorta was normal in size. - Mitral valve: There was mild regurgitation. - Right atrium: The atrium was normal in size. - Atrial septum: Bows from  left to right, suggesting high LApressure. There may be a small PFO by color doppler. - Tricuspid valve: There was trivial regurgitation. - Pulmonary arteries: The main pulmonary artery was normal-sized. - Inferior vena cava: The vessel was normal in size. Therespirophasic diameter changes were in the normal range (= 50%),consistent with normal central venous pressure. - Pericardium, extracardiac: Small anteriorly located partiallyorganized pericardial effusion which is heterogeneous appearing,could represent loculation or hemopericardium. Features were notconsistent with tamponade physiology. Impressions: Left ventricle has normal size with mild concentric  LVH andseverely decreased systolic function, LVEF 20-25%.There is diffuse hypokinesis.  MRI and MRA  MRI HEAD IMPRESSION: 1. Small volume patchy cortical and subcortical ischemic infarcts within the left frontal and right frontoparietal region, with additional tiny cortical infarct within the right occipital lobe. Given the distribution of these infarcts, an underlying watershed etiology is favored. No associated hemorrhage or mass effect. 2. Scattered small chronic micro hemorrhages as above. While these might be related to chronic underlying hypertension, possible amyloid angiopathy could also be considered given their somewhat peripheral distribution. 3. Atrophy with chronic microvascular ischemic disease.  MRA HEAD IMPRESSION: 1. No large vessel or proximal arterial branch occlusion identified. 2. Short-segment moderate stenosis within the supraclinoid left ICA. 3. Short-segment moderate stenosis within the proximal right P2 segment. 4. Distal small vessel atheromatous disease within the MCA, ACA, and PCA branches bilaterally  CUS Preliminary findings: Right = 1-39% ICA stenosis. Could not image left carotid due to IJ line. Please reorder if this is still needed once line and bandages are removed.  Cardiac cath  1. Normal to low filling pressures and normal cardiac output.  2. Moderate to severe ostial OM1 and severe distal LAD stenoses.    PHYSICAL EXAM  Temp:  [97.9 F (36.6 C)-98.9 F (37.2 C)] 98.9 F (37.2 C) (10/11 1200) Pulse Rate:  [0-163] 90 (10/11 1200) Resp:  [0-31] 24 (10/11 1200) BP: (97-176)/(50-97) 130/70 mmHg (10/11 1200) SpO2:  [0 %-100 %] 96 % (10/11 1200) Arterial Line BP: (134-185)/(53-92) 176/84 mmHg (10/11 1000)  General - Well nourished, well developed, in no apparent distress.  Ophthalmologic - Fundi not visualized due to eye movement.  Cardiovascular - Regular rate and rhythm.  Mental Status -  Level of arousal and orientation to  time, place, and person were intact. Language including expression, naming, repetition, comprehension was assessed and found intact. Fund of Knowledge was assessed and was intact.  Cranial Nerves II - XII - II - Visual field intact OU. III, IV, VI - Extraocular movements intact. V - Facial sensation intact bilaterally. VII - Facial movement intact bilaterally. VIII - Hearing & vestibular intact bilaterally. X - Palate elevates symmetrically. XI - Chin turning & shoulder shrug intact bilaterally. XII - Tongue protrusion intact.  Motor Strength - The patient's strength was normal in all extremities and pronator drift was absent.  Bulk was normal and fasciculations were absent.   Motor Tone - Muscle tone was assessed at the neck and appendages and was normal.  Reflexes - The patient's reflexes were 1+ in all extremities and she had no pathological reflexes.  Sensory - Light touch, temperature/pinprick were assessed and were symmetrical.    Coordination - The patient had normal movements in the hands and feet with no ataxia or dysmetria.  Tremor was absent.  Gait and Station - deferred due to NSTEMI.   ASSESSMENT/PLAN Ms. Julie Stein is a 80 y.o. female with history of hypertension and poor compliance. Admitted to the hospital with acute respriatory  failure and systolic heart failure who developed an episode of hypotension and unresponsiveness; upon awaking, she is only able to move her L side transiently. She did not receive IV t-PA due to resolving deficits, pt on IV heparin.   Stroke:  Favor watershed pattern likely due to hyperperfusion secondary to acute heart failure. However, cardioembolic infarct still in DDx given low EF 20-25%.  Resultant  resolution of deficit  MRI  Scattered right hemisphere punctate infarcts, favor watershed infarcts  MRA  Multifocal atherosclerosis  Carotid Doppler  unremarkable  2D Echo 08/26/15 EF 20-25%   Cardiac cath 08/27/15 - multivessel CAD but  normal cardiac output  LDL 152  HgbA1c 6.2  SCDs for VTE prophylaxis Diet Heart Room service appropriate?: Yes; Fluid consistency:: Thin  no antithrombotic listed prior to admission, now on aspirin 81 mg orally every day. Recommend ASA 325mg  daily.   Ongoing aggressive stroke risk factor management  Therapy recommendations:  Pending  Disposition:  Pending  CAD  Elevated troponin  Cardiologist on board  S/p cardiac cath showed multivessel CAD  On IV heparin, now off  2D echo showed EF 20-25%, but cardiac cath showed normal cardiac output  Recommend to repeat 2D echo in 4 weeks, if EF constantly low, we need to consider anticoagulation for stroke prevention until EF > 30%  Continue ASA 325mg  for now.  SVT  Cardiology on board  On amiodarone IV  Telemetry monitoring  Malignant Hypertension with pulmonary edema followed by severe Hypotension  BP as high as 209/127, as low as 67/39  Currently Stable  BP goal normotensive  Hyperlipidemia  LDL 152, goal LDL < 70   Home Meds: no statin  On Lipitor 80  Continue statin on discharge  Her Other Stroke Risk Factors  Advanced age  CAD  Other Active Problems  Viral illness x 1 week  Hospital day # 2  Neurology will sign off. Please call with questions. Pt will follow up with Dr. Roda Shutters at [facility] in about 2 months. Thanks for the consult.   Marvel Plan, MD PhD Stroke Neurology 08/28/2015 1:00 PM     To contact Stroke Continuity provider, please refer to A that averages [email or web address] After hours, contact General Neurology

## 2015-08-29 ENCOUNTER — Inpatient Hospital Stay (HOSPITAL_COMMUNITY)
Admission: RE | Admit: 2015-08-29 | Discharge: 2015-08-31 | DRG: 056 | Disposition: A | Discharge status: Other Acute Inpatient Hospital | Payer: Medicare Other | Source: Intra-hospital | Attending: Internal Medicine | Admitting: Internal Medicine

## 2015-08-29 DIAGNOSIS — I471 Supraventricular tachycardia: Secondary | ICD-10-CM | POA: Diagnosis present

## 2015-08-29 DIAGNOSIS — I161 Hypertensive emergency: Secondary | ICD-10-CM

## 2015-08-29 DIAGNOSIS — I69393 Ataxia following cerebral infarction: Secondary | ICD-10-CM | POA: Diagnosis not present

## 2015-08-29 DIAGNOSIS — I2510 Atherosclerotic heart disease of native coronary artery without angina pectoris: Secondary | ICD-10-CM | POA: Diagnosis present

## 2015-08-29 DIAGNOSIS — J9600 Acute respiratory failure, unspecified whether with hypoxia or hypercapnia: Secondary | ICD-10-CM | POA: Diagnosis not present

## 2015-08-29 DIAGNOSIS — I69354 Hemiplegia and hemiparesis following cerebral infarction affecting left non-dominant side: Principal | ICD-10-CM

## 2015-08-29 DIAGNOSIS — I5023 Acute on chronic systolic (congestive) heart failure: Secondary | ICD-10-CM | POA: Diagnosis present

## 2015-08-29 DIAGNOSIS — D649 Anemia, unspecified: Secondary | ICD-10-CM | POA: Diagnosis present

## 2015-08-29 DIAGNOSIS — H538 Other visual disturbances: Secondary | ICD-10-CM | POA: Diagnosis present

## 2015-08-29 DIAGNOSIS — I110 Hypertensive heart disease with heart failure: Secondary | ICD-10-CM | POA: Diagnosis present

## 2015-08-29 DIAGNOSIS — B349 Viral infection, unspecified: Secondary | ICD-10-CM | POA: Diagnosis present

## 2015-08-29 DIAGNOSIS — R55 Syncope and collapse: Secondary | ICD-10-CM | POA: Diagnosis not present

## 2015-08-29 DIAGNOSIS — I130 Hypertensive heart and chronic kidney disease with heart failure and stage 1 through stage 4 chronic kidney disease, or unspecified chronic kidney disease: Secondary | ICD-10-CM | POA: Diagnosis not present

## 2015-08-29 DIAGNOSIS — R269 Unspecified abnormalities of gait and mobility: Secondary | ICD-10-CM

## 2015-08-29 DIAGNOSIS — I429 Cardiomyopathy, unspecified: Secondary | ICD-10-CM | POA: Diagnosis present

## 2015-08-29 DIAGNOSIS — I959 Hypotension, unspecified: Secondary | ICD-10-CM | POA: Diagnosis not present

## 2015-08-29 DIAGNOSIS — I69398 Other sequelae of cerebral infarction: Secondary | ICD-10-CM

## 2015-08-29 DIAGNOSIS — I428 Other cardiomyopathies: Secondary | ICD-10-CM | POA: Diagnosis not present

## 2015-08-29 DIAGNOSIS — I10 Essential (primary) hypertension: Secondary | ICD-10-CM

## 2015-08-29 DIAGNOSIS — I6389 Other cerebral infarction: Secondary | ICD-10-CM | POA: Diagnosis present

## 2015-08-29 DIAGNOSIS — E785 Hyperlipidemia, unspecified: Secondary | ICD-10-CM | POA: Diagnosis present

## 2015-08-29 DIAGNOSIS — I638 Other cerebral infarction: Secondary | ICD-10-CM

## 2015-08-29 LAB — GLUCOSE, CAPILLARY
Glucose-Capillary: 112 mg/dL — ABNORMAL HIGH (ref 65–99)
Glucose-Capillary: 123 mg/dL — ABNORMAL HIGH (ref 65–99)
Glucose-Capillary: 75 mg/dL (ref 65–99)
Glucose-Capillary: 95 mg/dL (ref 65–99)

## 2015-08-29 LAB — BASIC METABOLIC PANEL
ANION GAP: 10 (ref 5–15)
BUN: 14 mg/dL (ref 6–20)
CALCIUM: 9.2 mg/dL (ref 8.9–10.3)
CO2: 26 mmol/L (ref 22–32)
Chloride: 103 mmol/L (ref 101–111)
Creatinine, Ser: 1.12 mg/dL — ABNORMAL HIGH (ref 0.44–1.00)
GFR, EST AFRICAN AMERICAN: 53 mL/min — AB (ref >60)
GFR, EST NON AFRICAN AMERICAN: 46 mL/min — AB (ref >60)
GLUCOSE: 128 mg/dL — AB (ref 65–99)
POTASSIUM: 3.7 mmol/L (ref 3.5–5.1)
SODIUM: 139 mmol/L (ref 135–145)

## 2015-08-29 LAB — CBC
HCT: 29.4 % — ABNORMAL LOW (ref 36.0–46.0)
Hemoglobin: 9.5 g/dL — ABNORMAL LOW (ref 12.0–15.0)
MCH: 23.5 pg — ABNORMAL LOW (ref 26.0–34.0)
MCHC: 32.3 g/dL (ref 30.0–36.0)
MCV: 72.6 fL — ABNORMAL LOW (ref 78.0–100.0)
PLATELETS: 225 10*3/uL (ref 150–400)
RBC: 4.05 MIL/uL (ref 3.87–5.11)
RDW: 16.2 % — AB (ref 11.5–15.5)
WBC: 12.2 10*3/uL — AB (ref 4.0–10.5)

## 2015-08-29 MED ORDER — CARVEDILOL 12.5 MG PO TABS
12.5000 mg | ORAL_TABLET | Freq: Two times a day (BID) | ORAL | Status: DC
  Administered 2015-08-30 (×2): 12.5 mg via ORAL
  Filled 2015-08-29 (×2): qty 1

## 2015-08-29 MED ORDER — AZITHROMYCIN 250 MG PO TABS: 250.0000 mg | ORAL_TABLET | Freq: Every day | ORAL | Status: DC

## 2015-08-29 MED ORDER — AZITHROMYCIN 250 MG PO TABS
250.0000 mg | ORAL_TABLET | Freq: Every day | ORAL | Status: DC
  Administered 2015-08-29: 250 mg via ORAL
  Filled 2015-08-29: qty 1

## 2015-08-29 MED ORDER — ISOSORB DINITRATE-HYDRALAZINE 20-37.5 MG PO TABS
1.0000 | ORAL_TABLET | Freq: Three times a day (TID) | ORAL | Status: DC
  Administered 2015-08-29 – 2015-08-30 (×4): 1 via ORAL
  Filled 2015-08-29 (×5): qty 1

## 2015-08-29 MED ORDER — ACETAMINOPHEN 325 MG PO TABS
650.0000 mg | ORAL_TABLET | ORAL | Status: DC | PRN
  Administered 2015-08-30: 650 mg via ORAL
  Filled 2015-08-29: qty 2

## 2015-08-29 MED ORDER — SPIRONOLACTONE 25 MG PO TABS: 12.5000 mg | ORAL_TABLET | Freq: Every day | ORAL | Status: DC

## 2015-08-29 MED ORDER — PNEUMOCOCCAL VAC POLYVALENT 25 MCG/0.5ML IJ INJ
0.5000 mL | INJECTION | INTRAMUSCULAR | Status: DC
  Filled 2015-08-29: qty 0.5

## 2015-08-29 MED ORDER — CARVEDILOL 12.5 MG PO TABS: 12.5000 mg | ORAL_TABLET | Freq: Two times a day (BID) | ORAL | Status: DC

## 2015-08-29 MED ORDER — ONDANSETRON HCL 4 MG PO TABS: 4.0000 mg | ORAL_TABLET | Freq: Four times a day (QID) | ORAL | Status: DC | PRN

## 2015-08-29 MED ORDER — ATORVASTATIN CALCIUM 80 MG PO TABS: 80.0000 mg | ORAL_TABLET | Freq: Every day | ORAL | Status: DC

## 2015-08-29 MED ORDER — AZITHROMYCIN 250 MG PO TABS
250.0000 mg | ORAL_TABLET | ORAL | Status: DC
  Administered 2015-08-29 – 2015-08-30 (×2): 250 mg via ORAL
  Filled 2015-08-29 (×2): qty 1

## 2015-08-29 MED ORDER — INFLUENZA VAC SPLIT QUAD 0.5 ML IM SUSY
0.5000 mL | PREFILLED_SYRINGE | INTRAMUSCULAR | Status: DC
  Filled 2015-08-29: qty 0.5

## 2015-08-29 MED ORDER — ONDANSETRON HCL 4 MG/2ML IJ SOLN: 4.0000 mg | Freq: Four times a day (QID) | INTRAMUSCULAR | Status: DC | PRN

## 2015-08-29 MED ORDER — ASPIRIN 325 MG PO TBEC: 325.0000 mg | DELAYED_RELEASE_TABLET | Freq: Every day | ORAL | Status: DC

## 2015-08-29 MED ORDER — ASPIRIN EC 325 MG PO TBEC
325.0000 mg | DELAYED_RELEASE_TABLET | Freq: Every day | ORAL | Status: DC
  Administered 2015-08-30: 325 mg via ORAL
  Filled 2015-08-29: qty 1

## 2015-08-29 MED ORDER — ISOSORB DINITRATE-HYDRALAZINE 20-37.5 MG PO TABS: 1.0000 | ORAL_TABLET | Freq: Three times a day (TID) | ORAL | Status: DC

## 2015-08-29 MED ORDER — SORBITOL 70 % SOLN: 30.0000 mL | Freq: Every day | Status: DC | PRN

## 2015-08-29 MED ORDER — CHLORHEXIDINE GLUCONATE 0.12 % MT SOLN
15.0000 mL | Freq: Two times a day (BID) | OROMUCOSAL | Status: DC
  Administered 2015-08-29 – 2015-08-30 (×3): 15 mL via OROMUCOSAL
  Filled 2015-08-29 (×3): qty 15

## 2015-08-29 MED ORDER — ATORVASTATIN CALCIUM 80 MG PO TABS
80.0000 mg | ORAL_TABLET | Freq: Every day | ORAL | Status: DC
  Administered 2015-08-30: 80 mg via ORAL
  Filled 2015-08-29: qty 1

## 2015-08-29 MED ORDER — SACUBITRIL-VALSARTAN 24-26 MG PO TABS
1.0000 | ORAL_TABLET | Freq: Two times a day (BID) | ORAL | Status: DC
  Administered 2015-08-29 – 2015-08-30 (×3): 1 via ORAL
  Filled 2015-08-29 (×4): qty 1

## 2015-08-29 MED ORDER — SACUBITRIL-VALSARTAN 24-26 MG PO TABS: 1.0000 | ORAL_TABLET | Freq: Two times a day (BID) | ORAL | Status: DC

## 2015-08-29 MED ORDER — AZITHROMYCIN 250 MG PO TABS
250.0000 mg | ORAL_TABLET | Freq: Every day | ORAL | Status: DC
Start: 2015-08-29 — End: 2015-08-29

## 2015-08-29 NOTE — Care Management Note (Signed)
Case Management Note  Patient Details  Name: Julie Stein MRN: [id number] Date of Birth: 1935/02/26  Subjective/Objective:      Lives at home with grandson and wife.  Both are in school in mornings and grandson then works but wife is home with patient.  Patient awake and alert sitting in chair.  Set up with [facility] with appt for 10-17 at 1030am.  Can go directly there  Post discharge and get her meds also for 3-10 dollars each - gave her coupons for 30 day free bidil and entresta. Cardiac rehab was just in and state that she has total weakness on the left and cannot even walk.  PT to see today.               Action/Plan:   Expected Discharge Date:                  Expected Discharge Plan:  Home/Self Care  In-House Referral:     Discharge planning Services  CM Consult, Medication Assistance  Post Acute Care Choice:    Choice offered to:     DME Arranged:    DME Agency:     HH Arranged:    HH Agency:     Status of Service:  In process, will continue to follow  Medicare Important Message Given:    Date Medicare IM Given:    Medicare IM give by:    Date Additional Medicare IM Given:    Additional Medicare Important Message give by:     If discussed at Long Length of Stay Meetings, dates discussed:    Additional Comments:  Vangie Bicker, RN 08/29/2015, 9:56 AM

## 2015-08-29 NOTE — Progress Notes (Signed)
Rehab Admissions Coordinator Note:  Patient was screened by Clois Dupes for appropriateness for an Inpatient Acute Rehab Consult per PT recommendation.  At this time, we are recommending Inpatient Rehab consult. I will discuss with RN CM to obtain order.  Clois Dupes 08/29/2015, 12:38 PM  I can be reached at [phone].

## 2015-08-29 NOTE — PMR Pre-admission (Signed)
PMR Admission Coordinator Pre-Admission Assessment  Patient: Julie Stein is an 80 y.o., female MRN: [id number] DOB: 1934/12/21 Height: 4\' 11"  (149.9 cm) Weight: 49.6 kg (109 lb 5.6 oz)              Insurance Information HMO: No    PPO:       PCP:       IPA:       80/20:       OTHER:   PRIMARY:  Medicare part A only      Policy#: [id number]      Subscriber: Lyn Henri CM Name:        Phone#:       Fax#:   Pre-Cert#:        Employer: Not employed Benefits:  Phone #:       Name: Checked in [place]. Date: 04/17/02 only part A     Deduct: $1288      Out of Pocket Max: none      Life Max: unlimited CIR: 100%      SNF: 100 days Outpatient:       Co-Pay:   Home Health: 100%      Co-Pay: none DME:       Co-Pay:   Providers: patient's choice  Medicaid Application Date:        Case Manager:   Disability Application Date:        Case Worker:    Emergency Contact Information Contact Information    Name Relation Home Work Mobile   Neis,James Spouse [phone]  [phone]   Newman,Wanda Daughter   [phone]   Lalone,Rasheedah Daughter [phone]  [phone]     Current Medical History  Patient Admitting Diagnosis: Watershed infarct pattern   History of Present Illness: A 80 y.o. right handed female history of hypertension. Patient lives with grandson and his wife large extended supportive family. Independent prior to admission and active. One level home 6 steps to entry. Presented 08/26/2015 with sudden onset of shortness of breath noted recent bouts of diarrhea, congestion and cough. Chest x-ray shows significant pulmonary edema. EKG with frequent PVCs bigeminy 2 runs of SVT heart rate 200 bpm. She became hypotensive placed on amiodarone drip. Bedside echocardiogram with ejection fraction 20-25% diffuse hypokinesis, RV functioning well. Placed on broad-spectrum antibiotic suspect viral illness. Troponin mildly elevated 0.22 days 0.29. Cardiology service is consulted and underwent cardiac  catheterization showing severe distal LAD stenosis moderate ostial large OMI stenosis. Advised medical management. Post catheterization noted left-sided weakness. MRI of the brain showed small volume patchy cortical and subcortical ischemic infarcts within the left frontal and right frontoparietal region with additional tiny cortical infarcts within the right occipital lobe. MRA of the head with no major vessel occlusion or stenosis. Placed on aspirin for CVA prophylaxis. Iniatially on contact droplet precautions but they have been discontinued. Physical therapy evaluation completed 08/29/2015 with recommendations of physical medicine rehabilitation consult.     Total: 0=NIH  Past Medical History  Past Medical History  Diagnosis Date  . Hypertension     Family History  family history is not on file.  Prior Rehab/Hospitalizations: No previous rehab admissions.   Has the patient had major surgery during 100 days prior to admission? No  Current Medications   Current facility-administered medications:  .  Place/Maintain arterial line, , , Until Discontinued **AND** 0.9 %  sodium chloride infusion, , Intra-arterial, PRN, Bernadene Person, NP .  0.9 %  sodium chloride infusion, 250 mL, Intravenous, PRN, Freida Busman  Alford Highland, MD .  acetaminophen (TYLENOL) tablet 650 mg, 650 mg, Oral, Q4H PRN, Laurey Morale, MD, 650 mg at 08/29/15 0104 .  antiseptic oral rinse (CPC / CETYLPYRIDINIUM CHLORIDE 0.05%) solution 7 mL, 7 mL, Mouth Rinse, q12n4p, Lars Masson, MD, 7 mL at 08/28/15 1600 .  aspirin EC tablet 325 mg, 325 mg, Oral, Daily, Marvel Plan, MD, 325 mg at 08/29/15 1000 .  atorvastatin (LIPITOR) tablet 80 mg, 80 mg, Oral, q1800, Laurey Morale, MD, 80 mg at 08/28/15 1638 .  azithromycin [facility]) tablet 250 mg, 250 mg, Oral, Daily, Earnie Larsson, RPH .  carvedilol (COREG) tablet 12.5 mg, 12.5 mg, Oral, BID WC, Laurey Morale, MD, 12.5 mg at 08/29/15 0800 .  chlorhexidine (PERIDEX) 0.12 %  solution 15 mL, 15 mL, Mouth Rinse, BID, Lars Masson, MD, 15 mL at 08/29/15 1000 .  insulin aspart (novoLOG) injection 0-15 Units, 0-15 Units, Subcutaneous, 6 times per day, Bernadene Person, NP, 2 Units at 08/29/15 0439 .  isosorbide-hydrALAZINE (BIDIL) 20-37.5 MG per tablet 1 tablet, 1 tablet, Oral, TID, Laurey Morale, MD, 1 tablet at 08/29/15 1000 .  nitroGLYCERIN 50 mg in dextrose 5 % 250 mL (0.2 mg/mL) infusion, 0-200 mcg/min, Intravenous, Continuous, Laurey Morale, MD, Stopped at 08/28/15 1230 .  ondansetron (ZOFRAN) injection 4 mg, 4 mg, Intravenous, Q6H PRN, Laurey Morale, MD, 4 mg at 08/28/15 1706 .  sacubitril-valsartan (ENTRESTO) 24-26 mg per tablet, 1 tablet, Oral, BID, Laurey Morale, MD, 1 tablet at 08/29/15 1000 .  sodium chloride 0.9 % injection 3 mL, 3 mL, Intravenous, Q12H, Lars Masson, MD, 3 mL at 08/29/15 1000 .  sodium chloride 0.9 % injection 3 mL, 3 mL, Intravenous, Q12H, Laurey Morale, MD, 3 mL at 08/29/15 1000 .  sodium chloride 0.9 % injection 3 mL, 3 mL, Intravenous, PRN, Laurey Morale, MD  Patients Current Diet: Diet Heart Room service appropriate?: Yes; Fluid consistency:: Thin Diet - low sodium heart healthy Diet - low sodium heart healthy  Precautions / Restrictions Precautions Precautions: Fall Restrictions Weight Bearing Restrictions: No   Has the patient had 2 or more falls or a fall with injury in the past year?No  Prior Activity Level Community (5-7x/wk): Goes out 3-4 X a week, was driving.  Home Assistive Devices / Equipment Home Assistive Devices/Equipment: None Home Equipment: None  Prior Device Use: Indicate devices/aids used by the patient prior to current illness, exacerbation or injury? None  Prior Functional Level Prior Function Level of Independence: Independent  Self Care: Did the patient need help bathing, dressing, using the toilet or eating?  Independent  Indoor Mobility: Did the patient need assistance  with walking from room to room (with or without device)? Independent  Stairs: Did the patient need assistance with internal or external stairs (with or without device)? Independent  Functional Cognition: Did the patient need help planning regular tasks such as shopping or remembering to take medications? Independent.  The only thing patient says she needed help with was gardening.  Current Functional Level Cognition  Overall Cognitive Status: Impaired/Different from baseline Orientation Level: Oriented X4 Following Commands: Follows one step commands inconsistently Safety/Judgement: Decreased awareness of safety, Decreased awareness of deficits General Comments: Pt had difficulty following commands with delayed processing of information at times.  Pt had trouble following commands for eye movements.      Extremity Assessment (includes Sensation/Coordination)  Upper Extremity Assessment: Defer to OT evaluation  Lower Extremity Assessment: LLE  deficits/detail LLE Deficits / Details: grossly 3+/5 LLE Sensation: decreased proprioception LLE Coordination: decreased fine motor, decreased gross motor    ADLs  Anticipate ADL deficits and needs for ongoing interventions    Mobility  General bed mobility comments: Pt in chair on arrival.     Transfers  Overall transfer level: Needs assistance Equipment used: Rolling walker (2 wheeled) Transfers: Sit to/from Stand Sit to Stand: Mod assist, +2 physical assistance General transfer comment: Pt able to perform sit to stand with mod assist and cues as pt leans to left and has difficulty with coordination of movement on left hemibody.      Ambulation / Gait / Stairs / Wheelchair Mobility  Ambulation/Gait Ambulation/Gait assistance: Mod assist, +2 physical assistance Ambulation Distance (Feet): 18 Feet Assistive device: Rolling walker (2 wheeled), 2 person hand held assist Gait Pattern/deviations: Step-to pattern, Decreased stride length,  Decreased step length - left, Decreased stance time - left, Decreased stance time - right, Decreased weight shift to right, Decreased weight shift to left, Ataxic, Staggering left, Staggering right, Trunk flexed, Narrow base of support General Gait Details: Pt with very poor postural stability overall.  Pt unable to maintain upright trunk with left lateral lean throughout.  Pt needed cues and assist to move left LE as pt could not coordinate the LE.  Also needed assist to weight shift to right to unweight left hemibody.  Question whether pt had decr awareness of right hemibody as well with questionable visual changes in right visual field.  With ambulation, PT giving heavy assist at times to keep pt upright.  Initially pt was ambulating without device first 6 steps and then used RW.  Even with RW, pt still required extensive assist.  Pt's family in room and PT had family assist pt with ambulation so they were aware of the assist given as intially they were convinced pt could go home, however once they tried to assist pt, they were agreeable that pt needs further therapy prior to d/c home.   Gait velocity interpretation: Below normal speed for age/gender    Posture / Balance Balance Overall balance assessment: Needs assistance Postural control: Left lateral lean Standing balance support: Bilateral upper extremity supported, During functional activity Standing balance-Leahy Scale: Poor Standing balance comment: Pt needs mod assist to stand with left lateral lean. Must have bil UE support.       Special needs/care/consideration BiPAP/CPAP No CPM No Continuous Drip IV No   Dialysis No       Life Vest No Oxygen No Special Bed No Trach Size No Wound Vac (area) No       Skin No                           Bowel mgmt: Last BM 08/26/15 Bladder mgmt: Voiding on bedpan WDL Diabetic mgmt No    Previous Home Environment Living Arrangements: Other relatives (lives with grandson and his wife, has a large  family) Available Help at Discharge: Family, Available 24 hours/day Type of Home: House Home Layout: One level Home Access: Stairs to enter Entrance Stairs-Rails: Right, Left, Can reach both Entrance Stairs-Number of Steps: 6 Bathroom Shower/Tub: Health visitor: Standard Bathroom Accessibility: Yes Home Care Services: No Additional Comments: Pt was gardening, doing yard work, cleaned house - was fully independent PTA.   Discharge Living Setting Plans for Discharge Living Setting: Patient's home, House, Lives with (comment) Lucila Maine and his wife live with patient.) Type of  Home at Discharge: House Discharge Home Layout: One level Discharge Home Access: Stairs to enter Entrance Stairs-Number of Steps: 3-4 steps at back and 7 steps at front entry. Does the patient have any problems obtaining your medications?: No  Social/Family/Support Systems Patient Roles: Spouse, Parent (Has a spouse, but not living with spouse.) Contact Information: See emergency contacts Anticipated Caregiver: Has a large extended family who can stay with her. Ability/Limitations of Caregiver: Lucila Maine and his wife go to school but can assist.  Dtr can also assist. Caregiver Availability: 24/7 Discharge Plan Discussed with Primary Caregiver: Yes Is Caregiver In Agreement with Plan?: Yes Does Caregiver/Family have Issues with Lodging/Transportation while Pt is in Rehab?: No  Goals/Additional Needs Patient/Family Goal for Rehab: PT/OT supervision goals Expected length of stay: 7-10 days Cultural Considerations: Muslim, does not eat pork. Dietary Needs: Heart diet, thin liquids Equipment Needs: TBD Pt/Family Agrees to Admission and willing to participate: Yes Program Orientation Provided & Reviewed with Pt/Caregiver Including Roles  & Responsibilities: Yes  Decrease burden of Care through IP rehab admission: N/A  Possible need for SNF placement upon discharge: Not anticipated  Patient  Condition: This patient's condition remains as documented in the consult dated 08/29/15, in which the Rehabilitation Physician determined and documented that the patient's condition is appropriate for intensive rehabilitative care in an inpatient rehabilitation facility. Will admit to inpatient rehab today.  Preadmission Screen Completed By:  Trish Mage, 08/29/2015 3:28 PM ______________________________________________________________________   Discussed status with Dr. Allena Katz on 08/29/15 at 1526 and received telephone approval for admission today.  Admission Coordinator:  Trish Mage, time 1526/Date10/12/16

## 2015-08-29 NOTE — Progress Notes (Signed)
Heart Failure Navigator Consult Note  Presentation: Julie Stein is a pleasant 80 year old female with h/o hypertension who presented with sudden onset of shortness of breath.  She has a history of HTN on no meds prior to admission was admitted with acute dyspnea and mild chest pain. Noted to have acute respiratory failure, initially requiring Bipap. CXR with pulmonary edema and echo with EF 20-25%, diffuse hypokinesis.   Past Medical History  Diagnosis Date  . Hypertension     Social History   Social History  . Marital Status: Married    Spouse Name: N/A  . Number of Children: N/A  . Years of Education: N/A   Social History Main Topics  . Smoking status: Never Smoker   . Smokeless tobacco: None  . Alcohol Use: No  . Drug Use: No  . Sexual Activity: Not Asked   Other Topics Concern  . None   Social History Narrative    ECHO:Study Conclusions--08/26/15  - Left ventricle: The cavity size was normal. There was mild concentric hypertrophy. Systolic function was severely reduced. The estimated ejection fraction was in the range of 20% to 25%. Diffuse hypokinesis. Doppler parameters are consistent with abnormal left ventricular relaxation (grade 1 diastolic dysfunction). The E/e&' ratio is >15, suggesting elevated LV filling pressure. Doppler parameters are consistent with elevated ventricular end-diastolic filling pressure. - Aortic valve: Mildly calcified leaflets. There was no stenosis. There was no regurgitation. - Aortic root: The aortic root was normal in size. - Ascending aorta: The ascending aorta was normal in size. - Mitral valve: There was mild regurgitation. - Right atrium: The atrium was normal in size. - Atrial septum: Bows from left to right, suggesting high LA pressure. There may be a small PFO by color doppler. - Tricuspid valve: There was trivial regurgitation. - Pulmonary arteries: The main pulmonary artery was normal-sized. - Inferior  vena cava: The vessel was normal in size. The respirophasic diameter changes were in the normal range (= 50%), consistent with normal central venous pressure. - Pericardium, extracardiac: Small anteriorly located partially organized pericardial effusion which is heterogeneous appearing, could represent loculation or hemopericardium. Features were not consistent with tamponade physiology.  Impressions:  - Left ventricle has normal size with mild concentric LVH and severely decreased systolic function, LVEF 20-25%. There is diffuse hypokinesis.  The fact that there is only grade I diastolic dysfunction with normal left atrial size and LV has normal size is suggestive of an acute process rather than a chronic condition.  Transthoracic echocardiography. M-mode, complete 2D, spectral Doppler, and color Doppler. Birthdate: Patient birthdate: [birth year]. Age: Patient is 80 yr old. Sex: Gender: female. Blood pressure:   149/93 Patient status: Inpatient. Study date: Study date: 08/26/2015. Study time: 04:10 PM. Location: Emergency department.  Cardiac Catheterization--08/27/15 1. Normal to low filling pressures and normal cardiac output.  2. Moderate to severe ostial OM1 and severe distal LAD stenoses.   I suspect that the etiology of her flash pulmonary edema was a hypertensive crisis leading to demand ischemia mediated by fixed stenoses in the ostial OM1 and distal LAD. Neither lesions appeared acute. She is doing much better. I would aim for medical management for now, can intervene on the OM1 if she has chest pain or flash pulmonary edema. I think that the etiology of her cardiomyopathy is likely poorly controlled HTN rather than the CAD.  BNP    Component Value Date/Time   BNP 451.8* 08/26/2015 1307    ProBNP No results found for: PROBNP  Education Assessment and Provision:  Detailed education and instructions provided on heart failure  disease management including the following:  Signs and symptoms of Heart Failure When to call the physician Importance of daily weights Low sodium diet Fluid restriction Medication management Anticipated future follow-up appointments  Patient education given on each of the above topics.  Patient acknowledges understanding and acceptance of all instructions.  I spoke briefly with Ms. Hoopingarner regarding her HF.  She tells me that she has had education before related to HF and HF recommendations for home.  She has a scale and weighs daily at home.  She can teach back when to contact the physician related to signs and symptoms.  We discussed a low sodium diet and high sodium foods to avoid.  She was not taking any medications prior to admission-however feels that she will not have any issues getting or taking prescribed meds.--However I do not see any insurance noted.  She lives with her grandson and his wife --yet plans to go to CIR immediately after this hospitalization.  I have provided a map and directions for her to get to [facility] follow-up appointment after she leaves CIR.  Education Materials:  "Living Better With Heart Failure" Booklet, Daily Weight Tracker Tool   High Risk Criteria for Readmission and/or Poor Patient Outcomes:  (Recommend Follow-up with [facility])--yes   EF <30%- No 20-25%  2 or more admissions in 6 months-1/6  Difficult social situation- No-lives with grandson and wife.  ? Insurance (may need med assistance)  Demonstrates medication noncompliance- Not taking any medications prior to admission.    Barriers of Care:  Knowledge and compliance  Discharge Planning:   Plans to go to CIR then to return to home.  I will make a referral to Outpatient CSW for financial concerns and ongoing medical costs.

## 2015-08-29 NOTE — Progress Notes (Signed)
Rehab admissions - Evaluated for possible admission.  I met with patient and she would like to come to inpatient rehab.  Bed available today and will admit to acute inpatient rehab today.  Call me for questions.  #[phone]

## 2015-08-29 NOTE — Interval H&P Note (Signed)
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  The patient's history has been reviewed, patient examined, and there is no change in status.  Patient continues to be appropriate for intensive inpatient rehabilitation.  I have reviewed the patient's chart and labs.  Questions were answered to the patient's satisfaction. The PAPE has been reviewed and assessment remains appropriate.  Ankit Karis Juba 08/29/2015, 6:41 PM

## 2015-08-29 NOTE — Progress Notes (Signed)
Dr.Mclean stated respiratory culture was negative and droplet precautions could be discontinued at 0900 this am.   Tammy Sours

## 2015-08-29 NOTE — Progress Notes (Signed)
Patient ID: Julie Stein, female   DOB: 22-Sep-1935, 80 y.o.   MRN: [id number] Patient admitted to 4M11 via wheelchair, escorted by nursing staff and family.  Patient and family verbalized understanding of rehab process, signed fall safety agreement.  Patient states she hasnt had a BM since 10/8 but hasnt eaten a meal before today and therefore would like to hold off on any bowel intervention at this time.  Patient appears to be in no immediate distress at this time.  Dani Gobble, RN

## 2015-08-29 NOTE — Progress Notes (Addendum)
Patient ID: Julie Stein, female   DOB: 1935/10/18, 80 y.o.   MRN: [id number]   SUBJECTIVE: Breathing back to normal.  CVP remains low.  BP now controlled.  Hemoglobin stable.   RHC/LHC:  Left Main  Short vessel, no significant disease.      Left Anterior Descending  20% proximal LAD, 40% mid LAD, 90% distal LAD stenoses.     Left Circumflex  There was a large high OM1 with 80% ostial stenosis. The AV LCx was a large, dominant vessel with luminal irregularities.     Right Coronary Artery  Small, nondominant vessel with no significant disease.       Right Heart Pressures RHC Procedural Findings: Hemodynamics (mmHg) RA mean 2 RV 19/6 PA 23/11 PCWP mean 8 LV 128/15 AO 126/68 Oxygen saturations: PA 70% AO 99% Cardiac Output (Fick) 4.95  Cardiac Index (Fick) 3.09     Scheduled Meds: . antiseptic oral rinse  7 mL Mouth Rinse q12n4p  . aspirin EC  325 mg Oral Daily  . atorvastatin  80 mg Oral q1800  . azithromycin  500 mg Intravenous Q24H  . carvedilol  12.5 mg Oral BID WC  . chlorhexidine  15 mL Mouth Rinse BID  . insulin aspart  0-15 Units Subcutaneous 6 times per day  . isosorbide-hydrALAZINE  1 tablet Oral TID  . sacubitril-valsartan  1 tablet Oral BID  . sodium chloride  3 mL Intravenous Q12H  . sodium chloride  3 mL Intravenous Q12H  . spironolactone  12.5 mg Oral Daily   Continuous Infusions: . nitroGLYCERIN Stopped (08/28/15 1230)   PRN Meds:.Place/Maintain arterial line **AND** sodium chloride, sodium chloride, acetaminophen, ondansetron (ZOFRAN) IV, sodium chloride    Filed Vitals:   08/29/15 0000 08/29/15 0400 08/29/15 0421 08/29/15 0735  BP:   113/53 122/78  Pulse:      Temp:   98 F (36.7 C) 97.4 F (36.3 C)  TempSrc:   Oral Oral  Resp: Height:      Weight:      SpO2:   94% 95%    Intake/Output Summary (Last 24 hours) at 08/29/15 0759 Last data filed at 08/29/15 0600  Gross per 24 hour  Intake  978.5 ml  Output    975 ml    Net    3.5 ml    LABS: Basic Metabolic Panel:  Recent Labs  40/98/11 1950  08/28/15 0530 08/28/15 0930 08/29/15 0442  NA 134*  --  136 139 139  K 3.6  --  3.6 3.3* 3.7  CL 95*  --  102 104 103  CO2 25  --  GLUCOSE 196*  --  111* 135* 128*  BUN 19  --  CREATININE 1.11*  < > 1.15* 1.05* 1.12*  CALCIUM 9.0  --  8.6* 9.0 9.2  MG 1.8  --  1.8  --   --   PHOS 3.9  --  2.6  --   --   < > = values in this interval not displayed. Liver Function Tests:  Recent Labs  08/26/15 1306 08/26/15 1950  AST 42* 59*  ALT 21 30  ALKPHOS 100 94  BILITOT 0.2* 0.3  PROT 7.6 7.5  ALBUMIN 3.3* 3.5   No results for input(s): LIPASE, AMYLASE in the last 72 hours. CBC:  Recent Labs  08/26/15 1306  08/28/15 1339 08/29/15 0442  WBC 11.1*  < > 11.6* 12.2*  NEUTROABS 7.7  --  9.9*  --   HGB 13.0  < > 9.1* 9.5*  HCT 40.4  < > 27.8* 29.4*  MCV 73.2*  < > 71.5* 72.6*  PLT 291  < > 197 225  < > = values in this interval not displayed. Cardiac Enzymes:  Recent Labs  08/26/15 1950 08/27/15 0230 08/27/15 0700  TROPONINI 0.31* 0.29* 0.22*   BNP: Invalid input(s): POCBNP D-Dimer: No results for input(s): DDIMER in the last 72 hours. Hemoglobin A1C:  Recent Labs  08/27/15 0230  HGBA1C 6.2*   Fasting Lipid Panel:  Recent Labs  08/27/15 0600  CHOL 267*  HDL 106  LDLCALC 152*  TRIG 46  CHOLHDL 2.5   Thyroid Function Tests:  Recent Labs  08/27/15 0034  TSH 1.927   Anemia Panel: No results for input(s): VITAMINB12, FOLATE, FERRITIN, TIBC, IRON, RETICCTPCT in the last 72 hours.  RADIOLOGY: Ct Head Wo Contrast  08/27/2015  CLINICAL DATA:  Initial evaluation for acute unresponsiveness. , left-sided weakness, now resolved. EXAM: CT HEAD WITHOUT CONTRAST TECHNIQUE: Contiguous axial images were obtained from the base of the skull through the vertex without intravenous contrast. COMPARISON:  None. FINDINGS: Age-related cerebral volume loss present. Patchy  and confluent hypodensity within the periventricular and deep white matter both cerebral hemispheres most consistent with chronic small vessel ischemic disease. Scattered vascular calcifications within the carotid siphons and distal right vertebral artery. No acute large vessel territory infarct. Gray-white matter differentiation grossly maintained. Deep gray nuclei are symmetric. No acute intracranial hemorrhage. No mass lesion, midline shift, or mass effect. No hydrocephalus. No extra-axial fluid collection. Scalp soft tissues within normal limits. No acute abnormality about the orbits. Paranasal sinuses mastoid air cells are clear. Calvarium intact. IMPRESSION: 1. No acute intracranial process. 2. Age-related cerebral atrophy with chronic small vessel ischemic disease. Electronically Signed   By: Rise Mu M.D.   On: 08/27/2015 02:31   [name] Wo Contrast  08/28/2015  CLINICAL DATA:  Initial evaluation for transient left-sided weakness following an episode of unresponsiveness and hypotension. EXAM: MRI HEAD WITHOUT CONTRAST MRA HEAD WITHOUT CONTRAST TECHNIQUE: Multiplanar, multiecho pulse sequences of the brain and surrounding structures were obtained without intravenous contrast. Angiographic images of the head were obtained using MRA technique without contrast. COMPARISON:  Prior CT from 08/27/2015. FINDINGS: MRI HEAD FINDINGS Diffuse prominence of the CSF containing spaces is compatible with generalized age-related cerebral atrophy. Patchy and confluent T2/FLAIR hyperintensity within the periventricular and deep white matter both cerebral hemispheres most consistent with chronic small vessel ischemic disease. Small remote lacunar infarct within the periventricular white matter of the left corona radiata. There is a few small foci of patchy restricted diffusion within the high left frontal lobe and right frontoparietal regions bilaterally (series 4, image 33, 35). Additional tiny punctate  cortical infarct more inferiorly within the right occipital lobe (series 4, image 26). Associated signal loss seen on corresponding ADC map. No associated hemorrhage or mass effect. Bodies consistent with small acute ischemic infarcts. Given the distribution of these findings, underlying watershed etiology is favored. Normal intravascular flow voids are maintained. Additional scattered subcentimeter foci of susceptibility artifact seen in within the peripheral cortices of the bilateral cerebral hemispheres on gradient echo sequence, consistent with small chronic micro hemorrhages. While these could potentially be related underlying hypertension, possible amyloid angiopathy could be considered given their somewhat peripheral distribution. No mass lesion, midline shift, or mass effect. No hydrocephalus. No extra-axial fluid collection. Craniocervical junction within normal limits. Mild degenerative spondylolysis noted within  the visualized upper cervical spine. Pituitary gland normal. Thinning of the anterior body of the corpus callosum noted, which may related to remote ischemia. No acute abnormality about the orbits. Mild right-sided exophthalmos. Paranasal sinuses are clear. No mastoid effusion. Inner ear structures within normal limits. Bone marrow signal intensity within normal limits. No scalp soft tissue abnormality. MRA HEAD FINDINGS ANTERIOR CIRCULATION: Visualized distal cervical segments of the internal carotid arteries are patent with antegrade flow. Petrous segments widely patent. Scattered multi focal atheromatous irregularity within the cavernous and supraclinoid left ICA. There is a short-segment moderate stenosis at the proximal supraclinoid left ICA (series 5, image 72). Cavernous and supraclinoid right ICA widely patent. Right A1 segment widely patent. Left A1 segment slightly hypoplastic. Anterior communicating artery patent. Atheromatous irregularity within the anterior cerebral arteries  bilaterally. M1 segments widely patent without stenosis or occlusion. Atheromatous irregularity within the left M1 segment. MCA bifurcations within normal limits. MCA branches symmetric bilaterally. Distal small vessel disease present within the MCA branches bilaterally. POSTERIOR CIRCULATION: Vertebral arteries patent to the vertebrobasilar junction. Right vertebral artery slightly diminutive. Posterior inferior cerebral arteries not well evaluated on this exam. Basilar artery widely patent. Small left anterior inferior cerebral artery noted. Superior cerebellar arteries patent. Both sub posterior cerebral arteries arise from the basilar artery and are well opacified to their distal aspects. Distal small vessel disease within the PCA branches bilaterally. There is a more focal short-segment moderate stenosis within the proximal right P2 segment (series 505, image 15). No aneurysm or vascular malformation. IMPRESSION: MRI HEAD IMPRESSION: 1. Small volume patchy cortical and subcortical ischemic infarcts within the left frontal and right frontoparietal region, with additional tiny cortical infarct within the right occipital lobe. Given the distribution of these infarcts, an underlying watershed etiology is favored. No associated hemorrhage or mass effect. 2. Scattered small chronic micro hemorrhages as above. While these might be related to chronic underlying hypertension, possible amyloid angiopathy could also be considered given their somewhat peripheral distribution. 3. Atrophy with chronic microvascular ischemic disease. MRA HEAD IMPRESSION: 1. No large vessel or proximal arterial branch occlusion identified. 2. Short-segment moderate stenosis within the supraclinoid left ICA. 3. Short-segment moderate stenosis within the proximal right P2 segment. 4. Distal small vessel atheromatous disease within the MCA, ACA, and PCA branches bilaterally Electronically Signed   By: Rise Mu M.D.   On: 08/28/2015  02:26   Mr Brain Wo Contrast  08/28/2015  CLINICAL DATA:  Initial evaluation for transient left-sided weakness following an episode of unresponsiveness and hypotension. EXAM: MRI HEAD WITHOUT CONTRAST MRA HEAD WITHOUT CONTRAST TECHNIQUE: Multiplanar, multiecho pulse sequences of the brain and surrounding structures were obtained without intravenous contrast. Angiographic images of the head were obtained using MRA technique without contrast. COMPARISON:  Prior CT from 08/27/2015. FINDINGS: MRI HEAD FINDINGS Diffuse prominence of the CSF containing spaces is compatible with generalized age-related cerebral atrophy. Patchy and confluent T2/FLAIR hyperintensity within the periventricular and deep white matter both cerebral hemispheres most consistent with chronic small vessel ischemic disease. Small remote lacunar infarct within the periventricular white matter of the left corona radiata. There is a few small foci of patchy restricted diffusion within the high left frontal lobe and right frontoparietal regions bilaterally (series 4, image 33, 35). Additional tiny punctate cortical infarct more inferiorly within the right occipital lobe (series 4, image 26). Associated signal loss seen on corresponding ADC map. No associated hemorrhage or mass effect. Bodies consistent with small acute ischemic infarcts. Given the distribution of these findings,  underlying watershed etiology is favored. Normal intravascular flow voids are maintained. Additional scattered subcentimeter foci of susceptibility artifact seen in within the peripheral cortices of the bilateral cerebral hemispheres on gradient echo sequence, consistent with small chronic micro hemorrhages. While these could potentially be related underlying hypertension, possible amyloid angiopathy could be considered given their somewhat peripheral distribution. No mass lesion, midline shift, or mass effect. No hydrocephalus. No extra-axial fluid collection. Craniocervical  junction within normal limits. Mild degenerative spondylolysis noted within the visualized upper cervical spine. Pituitary gland normal. Thinning of the anterior body of the corpus callosum noted, which may related to remote ischemia. No acute abnormality about the orbits. Mild right-sided exophthalmos. Paranasal sinuses are clear. No mastoid effusion. Inner ear structures within normal limits. Bone marrow signal intensity within normal limits. No scalp soft tissue abnormality. MRA HEAD FINDINGS ANTERIOR CIRCULATION: Visualized distal cervical segments of the internal carotid arteries are patent with antegrade flow. Petrous segments widely patent. Scattered multi focal atheromatous irregularity within the cavernous and supraclinoid left ICA. There is a short-segment moderate stenosis at the proximal supraclinoid left ICA (series 5, image 72). Cavernous and supraclinoid right ICA widely patent. Right A1 segment widely patent. Left A1 segment slightly hypoplastic. Anterior communicating artery patent. Atheromatous irregularity within the anterior cerebral arteries bilaterally. M1 segments widely patent without stenosis or occlusion. Atheromatous irregularity within the left M1 segment. MCA bifurcations within normal limits. MCA branches symmetric bilaterally. Distal small vessel disease present within the MCA branches bilaterally. POSTERIOR CIRCULATION: Vertebral arteries patent to the vertebrobasilar junction. Right vertebral artery slightly diminutive. Posterior inferior cerebral arteries not well evaluated on this exam. Basilar artery widely patent. Small left anterior inferior cerebral artery noted. Superior cerebellar arteries patent. Both sub posterior cerebral arteries arise from the basilar artery and are well opacified to their distal aspects. Distal small vessel disease within the PCA branches bilaterally. There is a more focal short-segment moderate stenosis within the proximal right P2 segment (series 505,  image 15). No aneurysm or vascular malformation. IMPRESSION: MRI HEAD IMPRESSION: 1. Small volume patchy cortical and subcortical ischemic infarcts within the left frontal and right frontoparietal region, with additional tiny cortical infarct within the right occipital lobe. Given the distribution of these infarcts, an underlying watershed etiology is favored. No associated hemorrhage or mass effect. 2. Scattered small chronic micro hemorrhages as above. While these might be related to chronic underlying hypertension, possible amyloid angiopathy could also be considered given their somewhat peripheral distribution. 3. Atrophy with chronic microvascular ischemic disease. MRA HEAD IMPRESSION: 1. No large vessel or proximal arterial branch occlusion identified. 2. Short-segment moderate stenosis within the supraclinoid left ICA. 3. Short-segment moderate stenosis within the proximal right P2 segment. 4. Distal small vessel atheromatous disease within the MCA, ACA, and PCA branches bilaterally Electronically Signed   By: Rise Mu M.D.   On: 08/28/2015 02:26   Dg Chest Port 1 View  08/28/2015  CLINICAL DATA:  Shortness of breath.  Pulmonary edema. EXAM: PORTABLE CHEST 1 VIEW COMPARISON:  08/26/2015. FINDINGS: Left IJ line in stable position. Cardiomegaly with bilateral pulmonary alveolar infiltrates. Slight improvement. Persistent small pleural effusions. No pneumothorax. IMPRESSION: 1. Left IJ line in stable position. 2. Cardiomegaly with persistent bilateral pulmonary infiltrates, partial clearing from prior exam. Persistent small pleural effusions. Electronically Signed   By: Maisie Fus  Register   On: 08/28/2015 07:18   Dg Chest Port 1 View  08/26/2015  CLINICAL DATA:  80 year old female central line placement. Initial encounter. EXAM: PORTABLE CHEST 1 VIEW  COMPARISON:  1306 hours today. FINDINGS: Portable AP semi upright view at 2259 hours. Left IJ central line has been placed. Tip is at the lower SVC  level. No pneumothorax. Stable cardiac size and mediastinal contours. Interval mild regression of widespread patchy in perihilar opacity, improvement is greater in the upper lobes. Residual in the lower lobes. No large pleural effusion. IMPRESSION: 1. Left IJ central line placed, tip at the lower SVC level with no adverse features. 2. Some interval regression of pulmonary edema, residual in the lower lungs. Electronically Signed   By: Odessa Fleming M.D.   On: 08/26/2015 23:06   Dg Chest Portable 1 View  08/26/2015  CLINICAL DATA:  Shortness of breath. EXAM: PORTABLE CHEST 1 VIEW COMPARISON:  None. FINDINGS: Borderline cardiomegaly is noted. No pneumothorax or significant pleural effusion is noted. Diffuse patchy airspace opacities are noted throughout both lungs concerning for pneumonia or edema. Bony thorax is unremarkable. IMPRESSION: Diffuse patchy bilateral airspace opacities are noted concerning for pneumonia or edema. Electronically Signed   By: Lupita Raider, M.D.   On: 08/26/2015 13:24    PHYSICAL EXAM General: NAD Neck: JVP 7 cm, no thyromegaly or thyroid nodule.  Lungs: Crackles at bases bilaterally.  CV: Nondisplaced PMI.  Heart regular S1/S2, +S3, no murmur.  No peripheral edema.   Abdomen: Soft, nontender, no hepatosplenomegaly, no distention.  Neurologic: Alert and oriented x 3, muscle strength grossly normal in all major muscle groups.   Psych: Normal affect. Extremities: No clubbing or cyanosis. Right groin cath site benign.   TELEMETRY: Reviewed telemetry pt in NSR with occasional PACs  ASSESSMENT AND PLAN: 80 yo with history of HTN on no meds prior to admission was admitted with acute dyspnea and mild chest pain.  Noted to have acute respiratory failure, initially requiring Bipap.  CXR with pulmonary edema and echo with EF 20-25%, diffuse hypokinesis.  1. Acute systolic CHF: No prior echo.  Echo done with EF 20-25%, mild LVH, diffuse hypokinesis.  Pulmonary edema on CXR.   Hypertensive, no meds at home.  Had viral-type prodrome preceding this admission.  Cause of cardiomyopathy uncertain => viral myocarditis versus hypertensive cardiomyopathy. Not ischemic CMP based on cath yesterday.  Suspect this was flash pulmonary edema in the setting of hypertensive emergency.  Could have also triggered ischemia in distal LAD/OM1 territories based on cath lesions.  CVP around 4 today.  - No Lasix.  - Continue Bidil, Coreg, and Entresto at current doses. 2. HTN: Hypertensive emergency with flash pulmonary edema at admission.  BP now controlled.   - Continue current meds.   - Still awaiting renal artery dopplers to look for renal artery stenosis.  3. TIA: Possible TIA with left foot weakness.  This has completely resolved.  CT head w/o CVA or bleed.  MRI head shows small infarcts in watershed region.   - She is on aspirin.   - Will check carotid dopplers after central line out.   4. CAD: Suspect mild troponin elevation was demand ischemia with CHF. She had coronary disease with severe distal LAD stenosis and moderate ostial large OM1 stenosis.  However, this does not explain her cardiomyopathy.  She could have had ischemia in these territories in setting of hypertensive emergency that worsened the flash pulmonary edema. - Medical management of CAD.  - ASA, statin.   5. ID: No definite evidence for infection, possible viral illness leading up to this presentation, sent coxsackievirus titers.  CCM recommend 5 days of azithromycin, home on  azithro 250 mg daily to complete 5 days.  6. SVT: No further SVT, now off amiodarone.  Will continue to follow telemetry.  7. Anemia: Hemoglobin stable today, cath site ok. 8. Ambulate in hall today.  If she does ok, I think she can go home after carotid and renal artery dopplers. Home meds: Entresto 24/26 bid, Bidil 1 tab tid, Coreg 12.5 bid, ASA 325 daily, atorvastatin 80 daily, azithromycin 250 mg daily to complete 5 days of treatment.  Needs  followup CHF clinic 2 wks with me.  May need care management to help with meds.   Marca Ancona 08/29/2015 7:59 AM

## 2015-08-29 NOTE — Progress Notes (Signed)
CARDIAC REHAB PHASE I   PRE:  Rate/Rhythm: 89 SR with PACs/PVCs    BP: sitting 159/98    SaO2: 93 RA  MODE:  Ambulation: to door of room   POST:  Rate/Rhythm: 106 ST with PACs    BP: sitting 135/80     SaO2: 90-92 RA  Pt eager to walk and go home however pt was leaning to left in recliner. Upon standing pt with posterior and left lean, unable to support herself. Pt had been hopeful not to use RW but could not take steps without support. Used RW, gait belt, assist x2 to walk to door. Needed max assist on left side due to leaning left. Pt not very aware of this. Struggled to bring left leg forward and have correct placement. Continuously walked too far into RW, could not process moving RW and legs simultaneously. Pt denies fatigue or SOB. Very weak toward end of walk but able to make it back to recliner with verbal cues for processing. Pt became tearful after sitting due to the fact that she is normally able to do squats and yard works. Her grandson who lives with her calls her a "80 yr old" normally. PT to see later. Pt unable to go home at this time.  [id number] Julie Stein CES, ACSM 08/29/2015 9:43 AM

## 2015-08-29 NOTE — H&P (View-Only) (Signed)
Physical Medicine and Rehabilitation Admission H&P    Chief Complaint  Patient presents with  . Shortness of Breath  : HPI: Julie Stein is a 80 y.o. right handed female history of hypertension. Patient lives with grandson and his wife large extended supportive family. Independent prior to admission and active. One level home 6 steps to entry. Presented 08/26/2015 with sudden onset of shortness of breath noted recent bouts of diarrhea, congestion and cough. Chest x-ray shows significant pulmonary edema. EKG with frequent PVCs bigeminy 2 runs of SVT heart rate 200 bpm. She became hypotensive placed on amiodarone drip. Bedside echocardiogram with ejection fraction 20-25% diffuse hypokinesis, RV functioning well. Placed on broad-spectrum antibiotic suspect viral illness. Troponin mildly elevated 0.22 days 0.29. Cardiology service is consulted and underwent cardiac catheterization showing severe distal LAD stenosis moderate ostial large OMI stenosis. Advised medical management. Post catheterization noted left-sided weakness. MRI of the brain showed small volume patchy cortical and subcortical ischemic infarcts within the left frontal and right frontoparietal region with additional tiny cortical infarcts within the right occipital lobe. MRA of the head with no major vessel occlusion or stenosis. Placed on aspirin for CVA prophylaxis. Maintained on contact droplet precautions. Physical therapy evaluation completed 08/29/2015 with recommendations of physical medicine rehabilitation consult. Patient was admitted for comprehensive rehabilitation program  ROS Review of Systems  Constitutional: Negative for fever and chills.  HENT: Negative for hearing loss.  Eyes: Positive for blurred vision. Negative for double vision.  Respiratory: Positive for shortness of breath.  Cardiovascular: Negative for chest pain and palpitations.  Gastrointestinal: Negative for vomiting.  Genitourinary: Negative for dysuria  and hematuria.  Skin: Negative for rash.  Neurological: Negative for seizures, loss of consciousness and headaches.  All other systems reviewed and are negative.  Past Medical History  Diagnosis Date  . Hypertension    Past Surgical History  Procedure Laterality Date  . Cardiac catheterization N/A 08/27/2015    Procedure: Right/Left Heart Cath and Coronary Angiography;  Surgeon: Larey Dresser, MD;  Location: [facility];  Service: Cardiovascular;  Laterality: N/A;   History reviewed. No pertinent family history. Social History:  reports that she has never smoked. She does not have any smokeless tobacco history on file. She reports that she does not drink alcohol or use illicit drugs. Allergies:  Allergies  Allergen Reactions  . Demerol [Meperidine]     intolerance   Medications Prior to Admission  Medication Sig Dispense Refill  . COLLAGEN PO Take 1 capsule by mouth daily.    . Multiple Vitamins-Minerals (MULTIVITAMIN) tablet Take 1 tablet by mouth daily.      Home: Home Living Family/patient expects to be discharged to:: Private residence Living Arrangements: Other relatives (lives with grandson and his wife, has a large family) Available Help at Discharge: Family, Available 24 hours/day Type of Home: House Home Access: Stairs to enter [employer] of Steps: 6 Entrance Stairs-Rails: Right, Left, Can reach both Home Layout: One level Bathroom Shower/Tub: Multimedia programmer: Standard Bathroom Accessibility: Yes Home Equipment: None Additional Comments: Pt was gardening, doing yard work, cleaned house - was fully independent PTA.    Functional History: Prior Function Level of Independence: Independent  Functional Status:  Mobility: Bed Mobility General bed mobility comments: Pt in chair on arrival.  Transfers Overall transfer level: Needs assistance Equipment used: Rolling walker (2 wheeled) Transfers: Sit to/from Stand Sit to  Stand: Mod assist, +2 physical assistance General transfer comment: Pt able to perform sit  to stand with mod assist and cues as pt leans to left and has difficulty with coordination of movement on left hemibody.   Ambulation/Gait Ambulation/Gait assistance: Mod assist, +2 physical assistance Ambulation Distance (Feet): 18 Feet Assistive device: Rolling walker (2 wheeled), 2 person hand held assist Gait Pattern/deviations: Step-to pattern, Decreased stride length, Decreased step length - left, Decreased stance time - left, Decreased stance time - right, Decreased weight shift to right, Decreased weight shift to left, Ataxic, Staggering left, Staggering right, Trunk flexed, Narrow base of support General Gait Details: Pt with very poor postural stability overall.  Pt unable to maintain upright trunk with left lateral lean throughout.  Pt needed cues and assist to move left LE as pt could not coordinate the LE.  Also needed assist to weight shift to right to unweight left hemibody.  Question whether pt had decr awareness of right hemibody as well with questionable visual changes in right visual field.  With ambulation, PT giving heavy assist at times to keep pt upright.  Initially pt was ambulating without device first 6 steps and then used RW.  Even with RW, pt still required extensive assist.  Pt's family in room and PT had family assist pt with ambulation so they were aware of the assist given as intially they were convinced pt could go home, however once they tried to assist pt, they were agreeable that pt needs further therapy prior to d/c home.   Gait velocity interpretation: Below normal speed for age/gender    ADL:    Cognition: Cognition Overall Cognitive Status: Impaired/Different from baseline Orientation Level: Oriented X4 Cognition Arousal/Alertness: Awake/alert Behavior During Therapy: Flat affect Overall Cognitive Status: Impaired/Different from baseline Area of Impairment:  Following commands, Safety/judgement, Awareness, Problem solving Following Commands: Follows one step commands inconsistently Safety/Judgement: Decreased awareness of safety, Decreased awareness of deficits Awareness: Intellectual Problem Solving: Difficulty sequencing, Requires verbal cues, Requires tactile cues General Comments: Pt had difficulty following commands with delayed processing of information at times.  Pt had trouble following commands for eye movements.    Physical Exam: Blood pressure 140/94, pulse 113, temperature 97.7 F (36.5 C), temperature source Oral, resp. rate 15, height $RemoveBe'4\' 11"'jdPpiptsI$  (1.499 m), weight 49.6 kg (109 lb 5.6 oz), SpO2 94 %. Physical Exam Vitals reviewed. Constitutional: She is oriented to person, place, and time. She appears well-developed and well-nourished.  HENT:  Head: Normocephalic and atraumatic.  Eyes: Conjunctivae and EOM are normal.  Negative nystagmus  Neck: Normal range of motion. Neck supple. No thyromegaly present.  Cardiovascular: Normal rate and regular rhythm.  Respiratory: Effort normal and breath sounds normal. No respiratory distress.  GI: Soft. Bowel sounds are normal. She exhibits no distension. There is no tenderness.  Musculoskeletal: She exhibits no edema.  PROM WNL B/l UE 4+/5 grossly RLE 4+/5 grossly LLE hip flex 4/5, ankle dorsi/plantar flexion 4+/5  Neurological: She is alert and oriented to person, place, and time. She has normal reflexes.  Makes good eye contact with examiner and follows full commands. Fair awareness of deficits Left trunk lean Sensation to light touch grossly intact throughout  Skin: Skin is warm and dry.  Psychiatric: She has a normal mood and affect. Her behavior is normal  Results for orders placed or performed during the hospital encounter of 08/26/15 (from the past 48 hour(s))  Glucose, capillary     Status: None   Collection Time: 08/27/15  4:56 PM  Result Value Ref Range   Glucose-Capillary 80  65 - 99  mg/dL   Comment 1 Capillary Specimen   Creatinine, serum     Status: Abnormal   Collection Time: 08/27/15  5:25 PM  Result Value Ref Range   Creatinine, Ser 1.35 (H) 0.44 - 1.00 mg/dL   GFR calc non Af Amer 37 (L) >60 mL/min   GFR calc Af Amer 42 (L) >60 mL/min    Comment: (NOTE) The eGFR has been calculated using the CKD EPI equation. This calculation has not been validated in all clinical situations. eGFR's persistently <60 mL/min signify possible Chronic Kidney Disease.   CMV IgM     Status: None   Collection Time: 08/27/15  5:25 PM  Result Value Ref Range   CMV IgM <30.0 0.0 - 29.9 AU/mL    Comment: (NOTE)                                Negative         <30.0                                Equivocal  30.0 - 34.9                                Positive         >34.9 A positive result is generally indicative of acute infection, reactivation or persistent IgM production. Performed At: [facility] [place], [place] [id number] Lindon Romp MD [id number]   HIV antibody     Status: None   Collection Time: 08/27/15  5:25 PM  Result Value Ref Range   HIV Screen 4th Generation wRfx Non Reactive Non Reactive    Comment: (NOTE) Performed At: [facility] [place], [place] [id number] Lindon Romp MD [id number]   Glucose, capillary     Status: Abnormal   Collection Time: 08/27/15  8:41 PM  Result Value Ref Range   Glucose-Capillary 123 (H) 65 - 99 mg/dL  Glucose, capillary     Status: Abnormal   Collection Time: 08/28/15  1:21 AM  Result Value Ref Range   Glucose-Capillary 122 (H) 65 - 99 mg/dL   Comment 1 Capillary Specimen   Glucose, capillary     Status: Abnormal   Collection Time: 08/28/15  4:19 AM  Result Value Ref Range   Glucose-Capillary 118 (H) 65 - 99 mg/dL   Comment 1 Capillary Specimen   Basic metabolic panel     Status: Abnormal   Collection Time: 08/28/15  5:30 AM  Result Value Ref Range    Sodium 136 135 - 145 mmol/L   Potassium 3.6 3.5 - 5.1 mmol/L   Chloride 102 101 - 111 mmol/L   CO2 27 22 - 32 mmol/L   Glucose, Bld 111 (H) 65 - 99 mg/dL   BUN 20 6 - 20 mg/dL   Creatinine, Ser 1.15 (H) 0.44 - 1.00 mg/dL   Calcium 8.6 (L) 8.9 - 10.3 mg/dL   GFR calc non Af Amer 44 (L) >60 mL/min   GFR calc Af Amer 51 (L) >60 mL/min    Comment: (NOTE) The eGFR has been calculated using the CKD EPI equation. This calculation has not been validated in all clinical situations. eGFR's persistently <60 mL/min signify possible Chronic Kidney Disease.    Anion gap 7 5 - 15  CBC     Status: Abnormal   Collection Time: 08/28/15  5:30 AM  Result Value Ref Range   WBC 11.4 (H) 4.0 - 10.5 K/uL    Comment: REPEATED TO VERIFY   RBC 3.71 (L) 3.87 - 5.11 MIL/uL   Hemoglobin 8.6 (L) 12.0 - 15.0 g/dL    Comment: REPEATED TO VERIFY   HCT 26.4 (L) 36.0 - 46.0 %   MCV 71.2 (L) 78.0 - 100.0 fL    Comment: CONSISTENT WITH PREVIOUS RESULT   MCH 23.2 (L) 26.0 - 34.0 pg   MCHC 32.6 30.0 - 36.0 g/dL   RDW 16.0 (H) 11.5 - 15.5 %    Comment: CONSISTENT WITH PREVIOUS RESULT   Platelets 189 150 - 400 K/uL    Comment: REPEATED TO VERIFY  Phosphorus     Status: None   Collection Time: 08/28/15  5:30 AM  Result Value Ref Range   Phosphorus 2.6 2.5 - 4.6 mg/dL  Magnesium     Status: None   Collection Time: 08/28/15  5:30 AM  Result Value Ref Range   Magnesium 1.8 1.7 - 2.4 mg/dL  Glucose, capillary     Status: Abnormal   Collection Time: 08/28/15  7:35 AM  Result Value Ref Range   Glucose-Capillary 105 (H) 65 - 99 mg/dL   Comment 1 Capillary Specimen   Basic metabolic panel     Status: Abnormal   Collection Time: 08/28/15  9:30 AM  Result Value Ref Range   Sodium 139 135 - 145 mmol/L   Potassium 3.3 (L) 3.5 - 5.1 mmol/L   Chloride 104 101 - 111 mmol/L   CO2 26 22 - 32 mmol/L   Glucose, Bld 135 (H) 65 - 99 mg/dL   BUN 17 6 - 20 mg/dL   Creatinine, Ser 1.05 (H) 0.44 - 1.00 mg/dL   Calcium 9.0 8.9  - 10.3 mg/dL   GFR calc non Af Amer 50 (L) >60 mL/min   GFR calc Af Amer 57 (L) >60 mL/min    Comment: (NOTE) The eGFR has been calculated using the CKD EPI equation. This calculation has not been validated in all clinical situations. eGFR's persistently <60 mL/min signify possible Chronic Kidney Disease.    Anion gap 9 5 - 15  Glucose, capillary     Status: Abnormal   Collection Time: 08/28/15 12:12 PM  Result Value Ref Range   Glucose-Capillary 124 (H) 65 - 99 mg/dL   Comment 1 Capillary Specimen   CBC with Differential/Platelet     Status: Abnormal   Collection Time: 08/28/15  1:39 PM  Result Value Ref Range   WBC 11.6 (H) 4.0 - 10.5 K/uL   RBC 3.89 3.87 - 5.11 MIL/uL   Hemoglobin 9.1 (L) 12.0 - 15.0 g/dL   HCT 27.8 (L) 36.0 - 46.0 %   MCV 71.5 (L) 78.0 - 100.0 fL   MCH 23.4 (L) 26.0 - 34.0 pg   MCHC 32.7 30.0 - 36.0 g/dL   RDW 16.1 (H) 11.5 - 15.5 %   Platelets 197 150 - 400 K/uL   Neutrophils Relative % 85 %   Neutro Abs 9.9 (H) 1.7 - 7.7 K/uL   Lymphocytes Relative 9 %   Lymphs Abs 1.1 0.7 - 4.0 K/uL   Monocytes Relative 5 %   Monocytes Absolute 0.6 0.1 - 1.0 K/uL   Eosinophils Relative 1 %   Eosinophils Absolute 0.1 0.0 - 0.7 K/uL   Basophils Relative 0 %   Basophils Absolute 0.0  0.0 - 0.1 K/uL  Glucose, capillary     Status: Abnormal   Collection Time: 08/28/15  4:30 PM  Result Value Ref Range   Glucose-Capillary 138 (H) 65 - 99 mg/dL   Comment 1 Capillary Specimen   Glucose, capillary     Status: Abnormal   Collection Time: 08/28/15  8:39 PM  Result Value Ref Range   Glucose-Capillary 130 (H) 65 - 99 mg/dL   Comment 1 Capillary Specimen   Glucose, capillary     Status: None   Collection Time: 08/28/15 11:42 PM  Result Value Ref Range   Glucose-Capillary 95 65 - 99 mg/dL   Comment 1 Capillary Specimen   Glucose, capillary     Status: Abnormal   Collection Time: 08/29/15  4:28 AM  Result Value Ref Range   Glucose-Capillary 123 (H) 65 - 99 mg/dL   Comment  1 Venous Specimen   CBC     Status: Abnormal   Collection Time: 08/29/15  4:42 AM  Result Value Ref Range   WBC 12.2 (H) 4.0 - 10.5 K/uL   RBC 4.05 3.87 - 5.11 MIL/uL   Hemoglobin 9.5 (L) 12.0 - 15.0 g/dL   HCT 29.4 (L) 36.0 - 46.0 %   MCV 72.6 (L) 78.0 - 100.0 fL   MCH 23.5 (L) 26.0 - 34.0 pg   MCHC 32.3 30.0 - 36.0 g/dL   RDW 16.2 (H) 11.5 - 15.5 %   Platelets 225 150 - 400 K/uL    Comment: SPECIMEN CHECKED FOR CLOTS REPEATED TO VERIFY PLATELET COUNT CONFIRMED BY SMEAR   Basic metabolic panel     Status: Abnormal   Collection Time: 08/29/15  4:42 AM  Result Value Ref Range   Sodium 139 135 - 145 mmol/L   Potassium 3.7 3.5 - 5.1 mmol/L   Chloride 103 101 - 111 mmol/L   CO2 26 22 - 32 mmol/L   Glucose, Bld 128 (H) 65 - 99 mg/dL   BUN 14 6 - 20 mg/dL   Creatinine, Ser 1.12 (H) 0.44 - 1.00 mg/dL   Calcium 9.2 8.9 - 10.3 mg/dL   GFR calc non Af Amer 46 (L) >60 mL/min   GFR calc Af Amer 53 (L) >60 mL/min    Comment: (NOTE) The eGFR has been calculated using the CKD EPI equation. This calculation has not been validated in all clinical situations. eGFR's persistently <60 mL/min signify possible Chronic Kidney Disease.    Anion gap 10 5 - 15  Glucose, capillary     Status: None   Collection Time: 08/29/15  7:37 AM  Result Value Ref Range   Glucose-Capillary 75 65 - 99 mg/dL   Comment 1 Capillary Specimen   Glucose, capillary     Status: Abnormal   Collection Time: 08/29/15 11:12 AM  Result Value Ref Range   Glucose-Capillary 112 (H) 65 - 99 mg/dL   Comment 1 Capillary Specimen    Mr Jodene Nam Head Wo Contrast  08/28/2015  CLINICAL DATA:  Initial evaluation for transient left-sided weakness following an episode of unresponsiveness and hypotension. EXAM: MRI HEAD WITHOUT CONTRAST MRA HEAD WITHOUT CONTRAST TECHNIQUE: Multiplanar, multiecho pulse sequences of the brain and surrounding structures were obtained without intravenous contrast. Angiographic images of the head were obtained  using MRA technique without contrast. COMPARISON:  Prior CT from 08/27/2015. FINDINGS: MRI HEAD FINDINGS Diffuse prominence of the CSF containing spaces is compatible with generalized age-related cerebral atrophy. Patchy and confluent T2/FLAIR hyperintensity within the periventricular and deep white matter  both cerebral hemispheres most consistent with chronic small vessel ischemic disease. Small remote lacunar infarct within the periventricular white matter of the left corona radiata. There is a few small foci of patchy restricted diffusion within the high left frontal lobe and right frontoparietal regions bilaterally (series 4, image 33, 35). Additional tiny punctate cortical infarct more inferiorly within the right occipital lobe (series 4, image 26). Associated signal loss seen on corresponding ADC map. No associated hemorrhage or mass effect. Bodies consistent with small acute ischemic infarcts. Given the distribution of these findings, underlying watershed etiology is favored. Normal intravascular flow voids are maintained. Additional scattered subcentimeter foci of susceptibility artifact seen in within the peripheral cortices of the bilateral cerebral hemispheres on gradient echo sequence, consistent with small chronic micro hemorrhages. While these could potentially be related underlying hypertension, possible amyloid angiopathy could be considered given their somewhat peripheral distribution. No mass lesion, midline shift, or mass effect. No hydrocephalus. No extra-axial fluid collection. Craniocervical junction within normal limits. Mild degenerative spondylolysis noted within the visualized upper cervical spine. Pituitary gland normal. Thinning of the anterior body of the corpus callosum noted, which may related to remote ischemia. No acute abnormality about the orbits. Mild right-sided exophthalmos. Paranasal sinuses are clear. No mastoid effusion. Inner ear structures within normal limits. Bone marrow  signal intensity within normal limits. No scalp soft tissue abnormality. MRA HEAD FINDINGS ANTERIOR CIRCULATION: Visualized distal cervical segments of the internal carotid arteries are patent with antegrade flow. Petrous segments widely patent. Scattered multi focal atheromatous irregularity within the cavernous and supraclinoid left ICA. There is a short-segment moderate stenosis at the proximal supraclinoid left ICA (series 5, image 72). Cavernous and supraclinoid right ICA widely patent. Right A1 segment widely patent. Left A1 segment slightly hypoplastic. Anterior communicating artery patent. Atheromatous irregularity within the anterior cerebral arteries bilaterally. M1 segments widely patent without stenosis or occlusion. Atheromatous irregularity within the left M1 segment. MCA bifurcations within normal limits. MCA branches symmetric bilaterally. Distal small vessel disease present within the MCA branches bilaterally. POSTERIOR CIRCULATION: Vertebral arteries patent to the vertebrobasilar junction. Right vertebral artery slightly diminutive. Posterior inferior cerebral arteries not well evaluated on this exam. Basilar artery widely patent. Small left anterior inferior cerebral artery noted. Superior cerebellar arteries patent. Both sub posterior cerebral arteries arise from the basilar artery and are well opacified to their distal aspects. Distal small vessel disease within the PCA branches bilaterally. There is a more focal short-segment moderate stenosis within the proximal right P2 segment (series 505, image 15). No aneurysm or vascular malformation. IMPRESSION: MRI HEAD IMPRESSION: 1. Small volume patchy cortical and subcortical ischemic infarcts within the left frontal and right frontoparietal region, with additional tiny cortical infarct within the right occipital lobe. Given the distribution of these infarcts, an underlying watershed etiology is favored. No associated hemorrhage or mass effect. 2.  Scattered small chronic micro hemorrhages as above. While these might be related to chronic underlying hypertension, possible amyloid angiopathy could also be considered given their somewhat peripheral distribution. 3. Atrophy with chronic microvascular ischemic disease. MRA HEAD IMPRESSION: 1. No large vessel or proximal arterial branch occlusion identified. 2. Short-segment moderate stenosis within the supraclinoid left ICA. 3. Short-segment moderate stenosis within the proximal right P2 segment. 4. Distal small vessel atheromatous disease within the MCA, ACA, and PCA branches bilaterally Electronically Signed   By: Jeannine Boga M.D.   On: 08/28/2015 02:26   Mr Brain Wo Contrast  08/28/2015  CLINICAL DATA:  Initial evaluation for transient  left-sided weakness following an episode of unresponsiveness and hypotension. EXAM: MRI HEAD WITHOUT CONTRAST MRA HEAD WITHOUT CONTRAST TECHNIQUE: Multiplanar, multiecho pulse sequences of the brain and surrounding structures were obtained without intravenous contrast. Angiographic images of the head were obtained using MRA technique without contrast. COMPARISON:  Prior CT from 08/27/2015. FINDINGS: MRI HEAD FINDINGS Diffuse prominence of the CSF containing spaces is compatible with generalized age-related cerebral atrophy. Patchy and confluent T2/FLAIR hyperintensity within the periventricular and deep white matter both cerebral hemispheres most consistent with chronic small vessel ischemic disease. Small remote lacunar infarct within the periventricular white matter of the left corona radiata. There is a few small foci of patchy restricted diffusion within the high left frontal lobe and right frontoparietal regions bilaterally (series 4, image 33, 35). Additional tiny punctate cortical infarct more inferiorly within the right occipital lobe (series 4, image 26). Associated signal loss seen on corresponding ADC map. No associated hemorrhage or mass effect. Bodies  consistent with small acute ischemic infarcts. Given the distribution of these findings, underlying watershed etiology is favored. Normal intravascular flow voids are maintained. Additional scattered subcentimeter foci of susceptibility artifact seen in within the peripheral cortices of the bilateral cerebral hemispheres on gradient echo sequence, consistent with small chronic micro hemorrhages. While these could potentially be related underlying hypertension, possible amyloid angiopathy could be considered given their somewhat peripheral distribution. No mass lesion, midline shift, or mass effect. No hydrocephalus. No extra-axial fluid collection. Craniocervical junction within normal limits. Mild degenerative spondylolysis noted within the visualized upper cervical spine. Pituitary gland normal. Thinning of the anterior body of the corpus callosum noted, which may related to remote ischemia. No acute abnormality about the orbits. Mild right-sided exophthalmos. Paranasal sinuses are clear. No mastoid effusion. Inner ear structures within normal limits. Bone marrow signal intensity within normal limits. No scalp soft tissue abnormality. MRA HEAD FINDINGS ANTERIOR CIRCULATION: Visualized distal cervical segments of the internal carotid arteries are patent with antegrade flow. Petrous segments widely patent. Scattered multi focal atheromatous irregularity within the cavernous and supraclinoid left ICA. There is a short-segment moderate stenosis at the proximal supraclinoid left ICA (series 5, image 72). Cavernous and supraclinoid right ICA widely patent. Right A1 segment widely patent. Left A1 segment slightly hypoplastic. Anterior communicating artery patent. Atheromatous irregularity within the anterior cerebral arteries bilaterally. M1 segments widely patent without stenosis or occlusion. Atheromatous irregularity within the left M1 segment. MCA bifurcations within normal limits. MCA branches symmetric bilaterally.  Distal small vessel disease present within the MCA branches bilaterally. POSTERIOR CIRCULATION: Vertebral arteries patent to the vertebrobasilar junction. Right vertebral artery slightly diminutive. Posterior inferior cerebral arteries not well evaluated on this exam. Basilar artery widely patent. Small left anterior inferior cerebral artery noted. Superior cerebellar arteries patent. Both sub posterior cerebral arteries arise from the basilar artery and are well opacified to their distal aspects. Distal small vessel disease within the PCA branches bilaterally. There is a more focal short-segment moderate stenosis within the proximal right P2 segment (series 505, image 15). No aneurysm or vascular malformation. IMPRESSION: MRI HEAD IMPRESSION: 1. Small volume patchy cortical and subcortical ischemic infarcts within the left frontal and right frontoparietal region, with additional tiny cortical infarct within the right occipital lobe. Given the distribution of these infarcts, an underlying watershed etiology is favored. No associated hemorrhage or mass effect. 2. Scattered small chronic micro hemorrhages as above. While these might be related to chronic underlying hypertension, possible amyloid angiopathy could also be considered given their somewhat peripheral distribution. 3.  Atrophy with chronic microvascular ischemic disease. MRA HEAD IMPRESSION: 1. No large vessel or proximal arterial branch occlusion identified. 2. Short-segment moderate stenosis within the supraclinoid left ICA. 3. Short-segment moderate stenosis within the proximal right P2 segment. 4. Distal small vessel atheromatous disease within the MCA, ACA, and PCA branches bilaterally Electronically Signed   By: Jeannine Boga M.D.   On: 08/28/2015 02:26   Dg Chest Port 1 View  08/28/2015  CLINICAL DATA:  Shortness of breath.  Pulmonary edema. EXAM: PORTABLE CHEST 1 VIEW COMPARISON:  08/26/2015. FINDINGS: Left IJ line in stable position.  Cardiomegaly with bilateral pulmonary alveolar infiltrates. Slight improvement. Persistent small pleural effusions. No pneumothorax. IMPRESSION: 1. Left IJ line in stable position. 2. Cardiomegaly with persistent bilateral pulmonary infiltrates, partial clearing from prior exam. Persistent small pleural effusions. Electronically Signed   By: Marcello Moores  Register   On: 08/28/2015 07:18    Medical Problem List and Plan: 1. 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  DVT Prophylaxis/Anticoagulation: SCDs. 3. Pain Management: Tylenol 4. CAD.Medical management with aspirin therapy 5. Neuropsych: This patient is capable of making decisions on her own behalf. 6. Skin/Wound Care: Routine skin checks 7. Fluids/Electrolytes/Nutrition: Routine eyewith follow-up chemistries 8. Hypertension.Bidil 20-30 7.5 mg 3 times a day, Coreg 12.5 mg twice a day,Entresto 24-26 milligrams twice a day. Monitor with increased mobility 9. Viral illness. Continue Zithromax 3 doses 10. Hyperlipidemia. Lipitor   Post Admission Physician Evaluation: 1. 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   2. Patient is admitted to receive collaborative, interdisciplinary care between the physiatrist, rehab nursing staff, and therapy team. 3. Patient's level of medical complexity and substantial therapy needs in context of that medical necessity cannot be provided at a lesser intensity of care such as a SNF. 4. Patient has experienced substantial functional loss from his/her baseline which was documented above under the "Functional History" and "Functional Status" headings.  Judging by the patient's diagnosis, physical exam, and functional history, the patient has potential for functional progress which will result in measurable gains while on inpatient rehab.  These gains will be of substantial and practical use upon discharge  in  facilitating mobility and self-care at the household level. 5. Physiatrist will provide 24 hour management of medical needs as well as oversight of the therapy plan/treatment and provide guidance as appropriate regarding the interaction of the two. 6. 24 hour rehab nursing will assist with safety, disease management, medication administration, pain management and patient education  and help integrate therapy concepts, techniques,education, etc. 7. PT will assess and treat for/with: Lower extremity strength, range of motion, stamina, balance, functional mobility, safety, adaptive techniques and equipment, woundcare, coping skills, pain control, and stroke education.   Goals are: Supervision. 8. OT will assess and treat for/with: ADL's, functional mobility, safety, upper extremity strength, adaptive techniques and equipment, wound mgt, ego support, and, stroke education, and community reintegration.   Goals are: Supervision/Mod I. Therapy may proceed with showering this patient. 9. Case Management and Social Worker will assess and treat for psychological issues and discharge planning. 10. Team conference will be held weekly to assess progress toward goals and to determine barriers to discharge. 11. Patient will receive at least 3 hours of therapy per day at least 5 days per week. 12. ELOS: 7-10 days.       13. Prognosis:  excellent  Delice Lesch, MD  08/29/2015

## 2015-08-29 NOTE — Discharge Summary (Signed)
Advanced Heart Failure Team  Discharge Summary   Patient ID: Julie Stein MRN: [id number], DOB/AGE: 1935-10-16 80 y.o. Admit date: 08/26/2015 D/C date:     08/29/2015   Primary Discharge Diagnoses:  1. A/C Systolic HF- ECHO 08/26/2015 EF 20-25% NICM  2. Hypertensive Emergency 3. TIA 4. CAD 5. ID 6. SVT 7. Anemia   Hospital Course:  80 yo with history of HTN on no meds prior to admission was admitted with acute dyspnea and mild chest pain. Noted to have acute respiratory failure, initially requiring Bipap. CXR with pulmonary edema and echo with EF 20-25%, diffuse hypokinesis.   1. Acute systolic CHF: No prior echo. Echo done 08/26/2015 with EF 20-25%, mild LVH, diffuse hypokinesis. Pulmonary edema on CXR. Hypertensive, no meds at home. Had viral-type prodrome preceding this admission. Cause of cardiomyopathy uncertain => viral myocarditis versus hypertensive cardiomyopathy. Had LHC/RHC on 10/10  NICM based on cath. Suspect this was flash pulmonary edema in the setting of hypertensive emergency. Could have also triggered ischemia in distal LAD/OM1 territories based on cath lesions.  - No Lasix.  - Continue Bidil, Coreg, and Entresto at current doses. -Plan to repeat ECHO in ~3 months after HF meds optimized.  2. HTN: Hypertensive emergency with flash pulmonary edema at admission. BP now controlled.  - Continue current meds.  - Still awaiting renal artery dopplers to look for renal artery stenosis.  3. TIA: Possible TIA with left foot weakness.Neurology consulted.  This has completely resolved. CT head w/o CVA or bleed. MRI head showed small infarcts in watershed region.  - Continue statin and aspirin.  -Carotid doppler-08/29/2015.  Findings consistent with 1-39 percent stenosis involving the right internal carotid artery. 4. CAD: Suspect mild troponin elevation was demand ischemia with CHF. She had coronary disease with severe distal LAD stenosis and moderate ostial  large OM1 stenosis. However, this does not explain her cardiomyopathy. She could have had ischemia in these territories in setting of hypertensive emergency that worsened the flash pulmonary edema. - Medical management of CAD.  - She will continue aspirin and statin.   5. ID: No definite evidence for infection, possible viral illness leading up to this presentation, sent coxsackievirus titers. CCM recommend 5 days of azithromycin. She will need an additional 3 days.   6. SVT: No further SVT, now off amiodarone.  7. Anemia: Hemoglobin stable today. 8 Deconditioning. PT/OT consulted with recommendations for inpatient rehab.   Today she is being discharged to inpatient rehab. HF team will continue to follow while on rehab.   RHC/LHC 08/27/2015  Left Main  Short vessel, no significant disease.      Left Anterior Descending  20% proximal LAD, 40% mid LAD, 90% distal LAD stenoses.     Left Circumflex  There was a large high OM1 with 80% ostial stenosis. The AV LCx was a large, dominant vessel with luminal irregularities.     Right Coronary Artery  Small, nondominant vessel with no significant disease.       Right Heart Pressures RHC Procedural Findings: Hemodynamics (mmHg) RA mean 2 RV 19/6 PA 23/11 PCWP mean 8 LV 128/15 AO 126/68  Oxygen saturations: PA 70% AO 99%  Cardiac Output (Fick) 3.09       Discharge Weight Range: 109 pounds,  Discharge Vitals: Blood pressure 140/94, pulse 113, temperature 97.7 F (36.5 C), temperature source Oral, resp. rate 15, height  (1.499 m), weight 109 lb 5.6 oz (49.6 kg), SpO2 94 %.  Labs: Lab Results  Component Value  Date   WBC 12.2* 08/29/2015   HGB 9.5* 08/29/2015   HCT 29.4* 08/29/2015   MCV 72.6* 08/29/2015   PLT 225 08/29/2015    Recent Labs Lab 08/26/15 1950  08/29/15 0442  NA 134*  < > 139  K 3.6  < > 3.7  CL 95*  < > 103  CO2 25  < > 26  BUN 19  < > 14  CREATININE 1.11*  < > 1.12*  CALCIUM 9.0  < >  9.2  PROT 7.5  --   --   BILITOT 0.3  --   --   ALKPHOS 94  --   --   ALT 30  --   --   AST 59*  --   --   GLUCOSE 196*  < > 128*  < > = values in this interval not displayed. Lab Results  Component Value Date   CHOL 267* 08/27/2015   HDL 106 08/27/2015   LDLCALC 152* 08/27/2015   TRIG 46 08/27/2015   BNP (last 3 results)  Recent Labs  08/26/15 1307  BNP 451.8*    ProBNP (last 3 results) No results for input(s): PROBNP in the last 8760 hours.   Diagnostic Studies/Procedures   Mr Julie Stein Wo Contrast  08/28/2015  CLINICAL DATA:  Initial evaluation for transient left-sided weakness following an episode of unresponsiveness and hypotension. EXAM: MRI HEAD WITHOUT CONTRAST MRA HEAD WITHOUT CONTRAST TECHNIQUE: Multiplanar, multiecho pulse sequences of the brain and surrounding structures were obtained without intravenous contrast. Angiographic images of the head were obtained using MRA technique without contrast. COMPARISON:  Prior CT from 08/27/2015. FINDINGS: MRI HEAD FINDINGS Diffuse prominence of the CSF containing spaces is compatible with generalized age-related cerebral atrophy. Patchy and confluent T2/FLAIR hyperintensity within the periventricular and deep white matter both cerebral hemispheres most consistent with chronic small vessel ischemic disease. Small remote lacunar infarct within the periventricular white matter of the left corona radiata. There is a few small foci of patchy restricted diffusion within the high left frontal lobe and right frontoparietal regions bilaterally (series 4, image 33, 35). Additional tiny punctate cortical infarct more inferiorly within the right occipital lobe (series 4, image 26). Associated signal loss seen on corresponding ADC map. No associated hemorrhage or mass effect. Bodies consistent with small acute ischemic infarcts. Given the distribution of these findings, underlying watershed etiology is favored. Normal intravascular flow voids are  maintained. Additional scattered subcentimeter foci of susceptibility artifact seen in within the peripheral cortices of the bilateral cerebral hemispheres on gradient echo sequence, consistent with small chronic micro hemorrhages. While these could potentially be related underlying hypertension, possible amyloid angiopathy could be considered given their somewhat peripheral distribution. No mass lesion, midline shift, or mass effect. No hydrocephalus. No extra-axial fluid collection. Craniocervical junction within normal limits. Mild degenerative spondylolysis noted within the visualized upper cervical spine. Pituitary gland normal. Thinning of the anterior body of the corpus callosum noted, which may related to remote ischemia. No acute abnormality about the orbits. Mild right-sided exophthalmos. Paranasal sinuses are clear. No mastoid effusion. Inner ear structures within normal limits. Bone marrow signal intensity within normal limits. No scalp soft tissue abnormality. MRA HEAD FINDINGS ANTERIOR CIRCULATION: Visualized distal cervical segments of the internal carotid arteries are patent with antegrade flow. Petrous segments widely patent. Scattered multi focal atheromatous irregularity within the cavernous and supraclinoid left ICA. There is a short-segment moderate stenosis at the proximal supraclinoid left ICA (series 5, image 72). Cavernous and supraclinoid right  ICA widely patent. Right A1 segment widely patent. Left A1 segment slightly hypoplastic. Anterior communicating artery patent. Atheromatous irregularity within the anterior cerebral arteries bilaterally. M1 segments widely patent without stenosis or occlusion. Atheromatous irregularity within the left M1 segment. MCA bifurcations within normal limits. MCA branches symmetric bilaterally. Distal small vessel disease present within the MCA branches bilaterally. POSTERIOR CIRCULATION: Vertebral arteries patent to the vertebrobasilar junction. Right  vertebral artery slightly diminutive. Posterior inferior cerebral arteries not well evaluated on this exam. Basilar artery widely patent. Small left anterior inferior cerebral artery noted. Superior cerebellar arteries patent. Both sub posterior cerebral arteries arise from the basilar artery and are well opacified to their distal aspects. Distal small vessel disease within the PCA branches bilaterally. There is a more focal short-segment moderate stenosis within the proximal right P2 segment (series 505, image 15). No aneurysm or vascular malformation. IMPRESSION: MRI HEAD IMPRESSION: 1. Small volume patchy cortical and subcortical ischemic infarcts within the left frontal and right frontoparietal region, with additional tiny cortical infarct within the right occipital lobe. Given the distribution of these infarcts, an underlying watershed etiology is favored. No associated hemorrhage or mass effect. 2. Scattered small chronic micro hemorrhages as above. While these might be related to chronic underlying hypertension, possible amyloid angiopathy could also be considered given their somewhat peripheral distribution. 3. Atrophy with chronic microvascular ischemic disease. MRA HEAD IMPRESSION: 1. No large vessel or proximal arterial branch occlusion identified. 2. Short-segment moderate stenosis within the supraclinoid left ICA. 3. Short-segment moderate stenosis within the proximal right P2 segment. 4. Distal small vessel atheromatous disease within the MCA, ACA, and PCA branches bilaterally Electronically Signed   By: Rise Mu M.D.   On: 08/28/2015 02:26   Mr Brain Wo Contrast  08/28/2015  CLINICAL DATA:  Initial evaluation for transient left-sided weakness following an episode of unresponsiveness and hypotension. EXAM: MRI HEAD WITHOUT CONTRAST MRA HEAD WITHOUT CONTRAST TECHNIQUE: Multiplanar, multiecho pulse sequences of the brain and surrounding structures were obtained without intravenous  contrast. Angiographic images of the head were obtained using MRA technique without contrast. COMPARISON:  Prior CT from 08/27/2015. FINDINGS: MRI HEAD FINDINGS Diffuse prominence of the CSF containing spaces is compatible with generalized age-related cerebral atrophy. Patchy and confluent T2/FLAIR hyperintensity within the periventricular and deep white matter both cerebral hemispheres most consistent with chronic small vessel ischemic disease. Small remote lacunar infarct within the periventricular white matter of the left corona radiata. There is a few small foci of patchy restricted diffusion within the high left frontal lobe and right frontoparietal regions bilaterally (series 4, image 33, 35). Additional tiny punctate cortical infarct more inferiorly within the right occipital lobe (series 4, image 26). Associated signal loss seen on corresponding ADC map. No associated hemorrhage or mass effect. Bodies consistent with small acute ischemic infarcts. Given the distribution of these findings, underlying watershed etiology is favored. Normal intravascular flow voids are maintained. Additional scattered subcentimeter foci of susceptibility artifact seen in within the peripheral cortices of the bilateral cerebral hemispheres on gradient echo sequence, consistent with small chronic micro hemorrhages. While these could potentially be related underlying hypertension, possible amyloid angiopathy could be considered given their somewhat peripheral distribution. No mass lesion, midline shift, or mass effect. No hydrocephalus. No extra-axial fluid collection. Craniocervical junction within normal limits. Mild degenerative spondylolysis noted within the visualized upper cervical spine. Pituitary gland normal. Thinning of the anterior body of the corpus callosum noted, which may related to remote ischemia. No acute abnormality about the orbits. Mild  right-sided exophthalmos. Paranasal sinuses are clear. No mastoid effusion.  Inner ear structures within normal limits. Bone marrow signal intensity within normal limits. No scalp soft tissue abnormality. MRA HEAD FINDINGS ANTERIOR CIRCULATION: Visualized distal cervical segments of the internal carotid arteries are patent with antegrade flow. Petrous segments widely patent. Scattered multi focal atheromatous irregularity within the cavernous and supraclinoid left ICA. There is a short-segment moderate stenosis at the proximal supraclinoid left ICA (series 5, image 72). Cavernous and supraclinoid right ICA widely patent. Right A1 segment widely patent. Left A1 segment slightly hypoplastic. Anterior communicating artery patent. Atheromatous irregularity within the anterior cerebral arteries bilaterally. M1 segments widely patent without stenosis or occlusion. Atheromatous irregularity within the left M1 segment. MCA bifurcations within normal limits. MCA branches symmetric bilaterally. Distal small vessel disease present within the MCA branches bilaterally. POSTERIOR CIRCULATION: Vertebral arteries patent to the vertebrobasilar junction. Right vertebral artery slightly diminutive. Posterior inferior cerebral arteries not well evaluated on this exam. Basilar artery widely patent. Small left anterior inferior cerebral artery noted. Superior cerebellar arteries patent. Both sub posterior cerebral arteries arise from the basilar artery and are well opacified to their distal aspects. Distal small vessel disease within the PCA branches bilaterally. There is a more focal short-segment moderate stenosis within the proximal right P2 segment (series 505, image 15). No aneurysm or vascular malformation. IMPRESSION: MRI HEAD IMPRESSION: 1. Small volume patchy cortical and subcortical ischemic infarcts within the left frontal and right frontoparietal region, with additional tiny cortical infarct within the right occipital lobe. Given the distribution of these infarcts, an underlying watershed etiology is  favored. No associated hemorrhage or mass effect. 2. Scattered small chronic micro hemorrhages as above. While these might be related to chronic underlying hypertension, possible amyloid angiopathy could also be considered given their somewhat peripheral distribution. 3. Atrophy with chronic microvascular ischemic disease. MRA HEAD IMPRESSION: 1. No large vessel or proximal arterial branch occlusion identified. 2. Short-segment moderate stenosis within the supraclinoid left ICA. 3. Short-segment moderate stenosis within the proximal right P2 segment. 4. Distal small vessel atheromatous disease within the MCA, ACA, and PCA branches bilaterally Electronically Signed   By: Rise Mu M.D.   On: 08/28/2015 02:26   Dg Chest Port 1 View  08/28/2015  CLINICAL DATA:  Shortness of breath.  Pulmonary edema. EXAM: PORTABLE CHEST 1 VIEW COMPARISON:  08/26/2015. FINDINGS: Left IJ line in stable position. Cardiomegaly with bilateral pulmonary alveolar infiltrates. Slight improvement. Persistent small pleural effusions. No pneumothorax. IMPRESSION: 1. Left IJ line in stable position. 2. Cardiomegaly with persistent bilateral pulmonary infiltrates, partial clearing from prior exam. Persistent small pleural effusions. Electronically Signed   By: Maisie Fus  Register   On: 08/28/2015 07:18    Discharge Medications     Medication List    TAKE these medications        aspirin 325 MG EC tablet  Take 1 tablet (325 mg total) by mouth daily.     atorvastatin 80 MG tablet  Commonly known as:  LIPITOR  Take 1 tablet (80 mg total) by mouth daily at 6 PM.     azithromycin 250 MG tablet  Commonly known as:  ZITHROMAX  Take 1 tablet (250 mg total) by mouth daily.     carvedilol 12.5 MG tablet  Commonly known as:  COREG  Take 1 tablet (12.5 mg total) by mouth 2 (two) times daily with a meal.     COLLAGEN PO  Take 1 capsule by mouth daily.     isosorbide-hydrALAZINE  20-37.5 MG tablet  Commonly known as:   BIDIL  Take 1 tablet by mouth 3 (three) times daily.     multivitamin tablet  Take 1 tablet by mouth daily.     sacubitril-valsartan 24-26 MG  Commonly known as:  ENTRESTO  Take 1 tablet by mouth 2 (two) times daily.        Disposition   The patient will be discharged in stable condition to home. Discharge Instructions    Ambulatory referral to Neurology    Complete by:  As directed   Pt will follow up with Dr. Roda Shutters at [facility] in about 2 months. Thanks.     Contraindication to ACEI at discharge    Complete by:  As directed      Contraindication to ACEI at discharge    Complete by:  As directed      Diet - low sodium heart healthy    Complete by:  As directed      Diet - low sodium heart healthy    Complete by:  As directed      Heart Failure patients record your daily weight using the same scale at the same time of day    Complete by:  As directed      Increase activity slowly    Complete by:  As directed      Increase activity slowly    Complete by:  As directed           Follow-up Information    Follow up with Xu,Jindong, MD. Schedule an appointment as soon as possible for a visit in 2 months.   Specialty:  Neurology   Why:  stroke clinic   Contact information:   [address] [place] [address] [phone]       Follow up with Marca Ancona, MD On 09/12/2015.   Specialty:  Cardiology   Why:  at 9:20 Garage Code [id number]   Contact information:   [address] [place] [address] [phone]       Follow up with [facility]    .   Why:  Appointment  for 10-17 at 10:30am -    Contact information:   [address] [place] [address] [phone]        Duration of Discharge Encounter: Greater than 35 minutes   Signed, Tangy Drozdowski NP-C  08/29/2015, 3:18 PM

## 2015-08-29 NOTE — H&P (Signed)
Physical Medicine and Rehabilitation Admission H&P    Chief Complaint  Patient presents with  . Shortness of Breath  : HPI: Julie Stein is a 80 y.o. right handed female history of hypertension. Patient lives with grandson and his wife large extended supportive family. Independent prior to admission and active. One level home 6 steps to entry. Presented 08/26/2015 with sudden onset of shortness of breath noted recent bouts of diarrhea, congestion and cough. Chest x-ray shows significant pulmonary edema. EKG with frequent PVCs bigeminy 2 runs of SVT heart rate 200 bpm. She became hypotensive placed on amiodarone drip. Bedside echocardiogram with ejection fraction 20-25% diffuse hypokinesis, RV functioning well. Placed on broad-spectrum antibiotic suspect viral illness. Troponin mildly elevated 0.22 days 0.29. Cardiology service is consulted and underwent cardiac catheterization showing severe distal LAD stenosis moderate ostial large OMI stenosis. Advised medical management. Post catheterization noted left-sided weakness. MRI of the brain showed small volume patchy cortical and subcortical ischemic infarcts within the left frontal and right frontoparietal region with additional tiny cortical infarcts within the right occipital lobe. MRA of the head with no major vessel occlusion or stenosis. Placed on aspirin for CVA prophylaxis. Maintained on contact droplet precautions. Physical therapy evaluation completed 08/29/2015 with recommendations of physical medicine rehabilitation consult. Patient was admitted for comprehensive rehabilitation program  ROS Review of Systems  Constitutional: Negative for fever and chills.  HENT: Negative for hearing loss.  Eyes: Positive for blurred vision. Negative for double vision.  Respiratory: Positive for shortness of breath.  Cardiovascular: Negative for chest pain and palpitations.  Gastrointestinal: Negative for vomiting.  Genitourinary: Negative for dysuria  and hematuria.  Skin: Negative for rash.  Neurological: Negative for seizures, loss of consciousness and headaches.  All other systems reviewed and are negative.  Past Medical History  Diagnosis Date  . Hypertension    Past Surgical History  Procedure Laterality Date  . Cardiac catheterization N/A 08/27/2015    Procedure: Right/Left Heart Cath and Coronary Angiography;  Surgeon: Laurey Morale, MD;  Location: [facility] INVASIVE CV LAB;  Service: Cardiovascular;  Laterality: N/A;   History reviewed. No pertinent family history. Social History:  reports that she has never smoked. She does not have any smokeless tobacco history on file. She reports that she does not drink alcohol or use illicit drugs. Allergies:  Allergies  Allergen Reactions  . Demerol [Meperidine]     intolerance   Medications Prior to Admission  Medication Sig Dispense Refill  . COLLAGEN PO Take 1 capsule by mouth daily.    . Multiple Vitamins-Minerals (MULTIVITAMIN) tablet Take 1 tablet by mouth daily.      Home: Home Living Family/patient expects to be discharged to:: Private residence Living Arrangements: Other relatives (lives with grandson and his wife, has a large family) Available Help at Discharge: Family, Available 24 hours/day Type of Home: House Home Access: Stairs to enter [employer] of Steps: 6 Entrance Stairs-Rails: Right, Left, Can reach both Home Layout: One level Bathroom Shower/Tub: Health visitor: Standard Bathroom Accessibility: Yes Home Equipment: None Additional Comments: Pt was gardening, doing yard work, cleaned house - was fully independent PTA.    Functional History: Prior Function Level of Independence: Independent  Functional Status:  Mobility: Bed Mobility General bed mobility comments: Pt in chair on arrival.  Transfers Overall transfer level: Needs assistance Equipment used: Rolling walker (2 wheeled) Transfers: Sit to/from Stand Sit to  Stand: Mod assist, +2 physical assistance General transfer comment: Pt able to perform sit  to stand with mod assist and cues as pt leans to left and has difficulty with coordination of movement on left hemibody.   Ambulation/Gait Ambulation/Gait assistance: Mod assist, +2 physical assistance Ambulation Distance (Feet): 18 Feet Assistive device: Rolling walker (2 wheeled), 2 person hand held assist Gait Pattern/deviations: Step-to pattern, Decreased stride length, Decreased step length - left, Decreased stance time - left, Decreased stance time - right, Decreased weight shift to right, Decreased weight shift to left, Ataxic, Staggering left, Staggering right, Trunk flexed, Narrow base of support General Gait Details: Pt with very poor postural stability overall.  Pt unable to maintain upright trunk with left lateral lean throughout.  Pt needed cues and assist to move left LE as pt could not coordinate the LE.  Also needed assist to weight shift to right to unweight left hemibody.  Question whether pt had decr awareness of right hemibody as well with questionable visual changes in right visual field.  With ambulation, PT giving heavy assist at times to keep pt upright.  Initially pt was ambulating without device first 6 steps and then used RW.  Even with RW, pt still required extensive assist.  Pt's family in room and PT had family assist pt with ambulation so they were aware of the assist given as intially they were convinced pt could go home, however once they tried to assist pt, they were agreeable that pt needs further therapy prior to d/c home.   Gait velocity interpretation: Below normal speed for age/gender    ADL:    Cognition: Cognition Overall Cognitive Status: Impaired/Different from baseline Orientation Level: Oriented X4 Cognition Arousal/Alertness: Awake/alert Behavior During Therapy: Flat affect Overall Cognitive Status: Impaired/Different from baseline Area of Impairment:  Following commands, Safety/judgement, Awareness, Problem solving Following Commands: Follows one step commands inconsistently Safety/Judgement: Decreased awareness of safety, Decreased awareness of deficits Awareness: Intellectual Problem Solving: Difficulty sequencing, Requires verbal cues, Requires tactile cues General Comments: Pt had difficulty following commands with delayed processing of information at times.  Pt had trouble following commands for eye movements.    Physical Exam: Blood pressure 140/94, pulse 113, temperature 97.7 F (36.5 C), temperature source Oral, resp. rate 15, height $RemoveBe'4\' 11"'[id number]$  (1.499 m), weight 49.6 kg (109 lb 5.6 oz), SpO2 94 %. Physical Exam Vitals reviewed. Constitutional: She is oriented to person, place, and time. She appears well-developed and well-nourished.  HENT:  Head: Normocephalic and atraumatic.  Eyes: Conjunctivae and EOM are normal.  Negative nystagmus  Neck: Normal range of motion. Neck supple. No thyromegaly present.  Cardiovascular: Normal rate and regular rhythm.  Respiratory: Effort normal and breath sounds normal. No respiratory distress.  GI: Soft. Bowel sounds are normal. She exhibits no distension. There is no tenderness.  Musculoskeletal: She exhibits no edema.  PROM WNL B/l UE 4+/5 grossly RLE 4+/5 grossly LLE hip flex 4/5, ankle dorsi/plantar flexion 4+/5  Neurological: She is alert and oriented to person, place, and time. She has normal reflexes.  Makes good eye contact with examiner and follows full commands. Fair awareness of deficits Left trunk lean Sensation to light touch grossly intact throughout  Skin: Skin is warm and dry.  Psychiatric: She has a normal mood and affect. Her behavior is normal  Results for orders placed or performed during the hospital encounter of 08/26/15 (from the past 48 hour(s))  Glucose, capillary     Status: None   Collection Time: 08/27/15  4:56 PM  Result Value Ref Range   Glucose-Capillary 80  65 - 99  mg/dL   Comment 1 Capillary Specimen   Creatinine, serum     Status: Abnormal   Collection Time: 08/27/15  5:25 PM  Result Value Ref Range   Creatinine, Ser 1.35 (H) 0.44 - 1.00 mg/dL   GFR calc non Af Amer 37 (L) >60 mL/min   GFR calc Af Amer 42 (L) >60 mL/min    Comment: (NOTE) The eGFR has been calculated using the CKD EPI equation. This calculation has not been validated in all clinical situations. eGFR's persistently <60 mL/min signify possible Chronic Kidney Disease.   CMV IgM     Status: None   Collection Time: 08/27/15  5:25 PM  Result Value Ref Range   CMV IgM <30.0 0.0 - 29.9 AU/mL    Comment: (NOTE)                                Negative         <30.0                                Equivocal  30.0 - 34.9                                Positive         >34.9 A positive result is generally indicative of acute infection, reactivation or persistent IgM production. Performed At: [facility] [place], [place] [id number] Lindon Romp MD YO:[id number]   HIV antibody     Status: None   Collection Time: 08/27/15  5:25 PM  Result Value Ref Range   HIV Screen 4th Generation wRfx Non Reactive Non Reactive    Comment: (NOTE) Performed At: [facility] [place], [place] [id number] Lindon Romp MD YE:[id number]   Glucose, capillary     Status: Abnormal   Collection Time: 08/27/15  8:41 PM  Result Value Ref Range   Glucose-Capillary 123 (H) 65 - 99 mg/dL  Glucose, capillary     Status: Abnormal   Collection Time: 08/28/15  1:21 AM  Result Value Ref Range   Glucose-Capillary 122 (H) 65 - 99 mg/dL   Comment 1 Capillary Specimen   Glucose, capillary     Status: Abnormal   Collection Time: 08/28/15  4:19 AM  Result Value Ref Range   Glucose-Capillary 118 (H) 65 - 99 mg/dL   Comment 1 Capillary Specimen   Basic metabolic panel     Status: Abnormal   Collection Time: 08/28/15  5:30 AM  Result Value Ref Range    Sodium 136 135 - 145 mmol/L   Potassium 3.6 3.5 - 5.1 mmol/L   Chloride 102 101 - 111 mmol/L   CO2 27 22 - 32 mmol/L   Glucose, Bld 111 (H) 65 - 99 mg/dL   BUN 20 6 - 20 mg/dL   Creatinine, Ser 1.15 (H) 0.44 - 1.00 mg/dL   Calcium 8.6 (L) 8.9 - 10.3 mg/dL   GFR calc non Af Amer 44 (L) >60 mL/min   GFR calc Af Amer 51 (L) >60 mL/min    Comment: (NOTE) The eGFR has been calculated using the CKD EPI equation. This calculation has not been validated in all clinical situations. eGFR's persistently <60 mL/min signify possible Chronic Kidney Disease.    Anion gap 7 5 - 15  CBC     Status: Abnormal   Collection Time: 08/28/15  5:30 AM  Result Value Ref Range   WBC 11.4 (H) 4.0 - 10.5 K/uL    Comment: REPEATED TO VERIFY   RBC 3.71 (L) 3.87 - 5.11 MIL/uL   Hemoglobin 8.6 (L) 12.0 - 15.0 g/dL    Comment: REPEATED TO VERIFY   HCT 26.4 (L) 36.0 - 46.0 %   MCV 71.2 (L) 78.0 - 100.0 fL    Comment: CONSISTENT WITH PREVIOUS RESULT   MCH 23.2 (L) 26.0 - 34.0 pg   MCHC 32.6 30.0 - 36.0 g/dL   RDW 16.0 (H) 11.5 - 15.5 %    Comment: CONSISTENT WITH PREVIOUS RESULT   Platelets 189 150 - 400 K/uL    Comment: REPEATED TO VERIFY  Phosphorus     Status: None   Collection Time: 08/28/15  5:30 AM  Result Value Ref Range   Phosphorus 2.6 2.5 - 4.6 mg/dL  Magnesium     Status: None   Collection Time: 08/28/15  5:30 AM  Result Value Ref Range   Magnesium 1.8 1.7 - 2.4 mg/dL  Glucose, capillary     Status: Abnormal   Collection Time: 08/28/15  7:35 AM  Result Value Ref Range   Glucose-Capillary 105 (H) 65 - 99 mg/dL   Comment 1 Capillary Specimen   Basic metabolic panel     Status: Abnormal   Collection Time: 08/28/15  9:30 AM  Result Value Ref Range   Sodium 139 135 - 145 mmol/L   Potassium 3.3 (L) 3.5 - 5.1 mmol/L   Chloride 104 101 - 111 mmol/L   CO2 26 22 - 32 mmol/L   Glucose, Bld 135 (H) 65 - 99 mg/dL   BUN 17 6 - 20 mg/dL   Creatinine, Ser 1.05 (H) 0.44 - 1.00 mg/dL   Calcium 9.0 8.9  - 10.3 mg/dL   GFR calc non Af Amer 50 (L) >60 mL/min   GFR calc Af Amer 57 (L) >60 mL/min    Comment: (NOTE) The eGFR has been calculated using the CKD EPI equation. This calculation has not been validated in all clinical situations. eGFR's persistently <60 mL/min signify possible Chronic Kidney Disease.    Anion gap 9 5 - 15  Glucose, capillary     Status: Abnormal   Collection Time: 08/28/15 12:12 PM  Result Value Ref Range   Glucose-Capillary 124 (H) 65 - 99 mg/dL   Comment 1 Capillary Specimen   CBC with Differential/Platelet     Status: Abnormal   Collection Time: 08/28/15  1:39 PM  Result Value Ref Range   WBC 11.6 (H) 4.0 - 10.5 K/uL   RBC 3.89 3.87 - 5.11 MIL/uL   Hemoglobin 9.1 (L) 12.0 - 15.0 g/dL   HCT 27.8 (L) 36.0 - 46.0 %   MCV 71.5 (L) 78.0 - 100.0 fL   MCH 23.4 (L) 26.0 - 34.0 pg   MCHC 32.7 30.0 - 36.0 g/dL   RDW 16.1 (H) 11.5 - 15.5 %   Platelets 197 150 - 400 K/uL   Neutrophils Relative % 85 %   Neutro Abs 9.9 (H) 1.7 - 7.7 K/uL   Lymphocytes Relative 9 %   Lymphs Abs 1.1 0.7 - 4.0 K/uL   Monocytes Relative 5 %   Monocytes Absolute 0.6 0.1 - 1.0 K/uL   Eosinophils Relative 1 %   Eosinophils Absolute 0.1 0.0 - 0.7 K/uL   Basophils Relative 0 %   Basophils Absolute 0.0  0.0 - 0.1 K/uL  Glucose, capillary     Status: Abnormal   Collection Time: 08/28/15  4:30 PM  Result Value Ref Range   Glucose-Capillary 138 (H) 65 - 99 mg/dL   Comment 1 Capillary Specimen   Glucose, capillary     Status: Abnormal   Collection Time: 08/28/15  8:39 PM  Result Value Ref Range   Glucose-Capillary 130 (H) 65 - 99 mg/dL   Comment 1 Capillary Specimen   Glucose, capillary     Status: None   Collection Time: 08/28/15 11:42 PM  Result Value Ref Range   Glucose-Capillary 95 65 - 99 mg/dL   Comment 1 Capillary Specimen   Glucose, capillary     Status: Abnormal   Collection Time: 08/29/15  4:28 AM  Result Value Ref Range   Glucose-Capillary 123 (H) 65 - 99 mg/dL   Comment  1 Venous Specimen   CBC     Status: Abnormal   Collection Time: 08/29/15  4:42 AM  Result Value Ref Range   WBC 12.2 (H) 4.0 - 10.5 K/uL   RBC 4.05 3.87 - 5.11 MIL/uL   Hemoglobin 9.5 (L) 12.0 - 15.0 g/dL   HCT 29.4 (L) 36.0 - 46.0 %   MCV 72.6 (L) 78.0 - 100.0 fL   MCH 23.5 (L) 26.0 - 34.0 pg   MCHC 32.3 30.0 - 36.0 g/dL   RDW 16.2 (H) 11.5 - 15.5 %   Platelets 225 150 - 400 K/uL    Comment: SPECIMEN CHECKED FOR CLOTS REPEATED TO VERIFY PLATELET COUNT CONFIRMED BY SMEAR   Basic metabolic panel     Status: Abnormal   Collection Time: 08/29/15  4:42 AM  Result Value Ref Range   Sodium 139 135 - 145 mmol/L   Potassium 3.7 3.5 - 5.1 mmol/L   Chloride 103 101 - 111 mmol/L   CO2 26 22 - 32 mmol/L   Glucose, Bld 128 (H) 65 - 99 mg/dL   BUN 14 6 - 20 mg/dL   Creatinine, Ser 1.12 (H) 0.44 - 1.00 mg/dL   Calcium 9.2 8.9 - 10.3 mg/dL   GFR calc non Af Amer 46 (L) >60 mL/min   GFR calc Af Amer 53 (L) >60 mL/min    Comment: (NOTE) The eGFR has been calculated using the CKD EPI equation. This calculation has not been validated in all clinical situations. eGFR's persistently <60 mL/min signify possible Chronic Kidney Disease.    Anion gap 10 5 - 15  Glucose, capillary     Status: None   Collection Time: 08/29/15  7:37 AM  Result Value Ref Range   Glucose-Capillary 75 65 - 99 mg/dL   Comment 1 Capillary Specimen   Glucose, capillary     Status: Abnormal   Collection Time: 08/29/15 11:12 AM  Result Value Ref Range   Glucose-Capillary 112 (H) 65 - 99 mg/dL   Comment 1 Capillary Specimen    Mr Jodene Nam Head Wo Contrast  08/28/2015  CLINICAL DATA:  Initial evaluation for transient left-sided weakness following an episode of unresponsiveness and hypotension. EXAM: MRI HEAD WITHOUT CONTRAST MRA HEAD WITHOUT CONTRAST TECHNIQUE: Multiplanar, multiecho pulse sequences of the brain and surrounding structures were obtained without intravenous contrast. Angiographic images of the head were obtained  using MRA technique without contrast. COMPARISON:  Prior CT from 08/27/2015. FINDINGS: MRI HEAD FINDINGS Diffuse prominence of the CSF containing spaces is compatible with generalized age-related cerebral atrophy. Patchy and confluent T2/FLAIR hyperintensity within the periventricular and deep white matter  both cerebral hemispheres most consistent with chronic small vessel ischemic disease. Small remote lacunar infarct within the periventricular white matter of the left corona radiata. There is a few small foci of patchy restricted diffusion within the high left frontal lobe and right frontoparietal regions bilaterally (series 4, image 33, 35). Additional tiny punctate cortical infarct more inferiorly within the right occipital lobe (series 4, image 26). Associated signal loss seen on corresponding ADC map. No associated hemorrhage or mass effect. Bodies consistent with small acute ischemic infarcts. Given the distribution of these findings, underlying watershed etiology is favored. Normal intravascular flow voids are maintained. Additional scattered subcentimeter foci of susceptibility artifact seen in within the peripheral cortices of the bilateral cerebral hemispheres on gradient echo sequence, consistent with small chronic micro hemorrhages. While these could potentially be related underlying hypertension, possible amyloid angiopathy could be considered given their somewhat peripheral distribution. No mass lesion, midline shift, or mass effect. No hydrocephalus. No extra-axial fluid collection. Craniocervical junction within normal limits. Mild degenerative spondylolysis noted within the visualized upper cervical spine. Pituitary gland normal. Thinning of the anterior body of the corpus callosum noted, which may related to remote ischemia. No acute abnormality about the orbits. Mild right-sided exophthalmos. Paranasal sinuses are clear. No mastoid effusion. Inner ear structures within normal limits. Bone marrow  signal intensity within normal limits. No scalp soft tissue abnormality. MRA HEAD FINDINGS ANTERIOR CIRCULATION: Visualized distal cervical segments of the internal carotid arteries are patent with antegrade flow. Petrous segments widely patent. Scattered multi focal atheromatous irregularity within the cavernous and supraclinoid left ICA. There is a short-segment moderate stenosis at the proximal supraclinoid left ICA (series 5, image 72). Cavernous and supraclinoid right ICA widely patent. Right A1 segment widely patent. Left A1 segment slightly hypoplastic. Anterior communicating artery patent. Atheromatous irregularity within the anterior cerebral arteries bilaterally. M1 segments widely patent without stenosis or occlusion. Atheromatous irregularity within the left M1 segment. MCA bifurcations within normal limits. MCA branches symmetric bilaterally. Distal small vessel disease present within the MCA branches bilaterally. POSTERIOR CIRCULATION: Vertebral arteries patent to the vertebrobasilar junction. Right vertebral artery slightly diminutive. Posterior inferior cerebral arteries not well evaluated on this exam. Basilar artery widely patent. Small left anterior inferior cerebral artery noted. Superior cerebellar arteries patent. Both sub posterior cerebral arteries arise from the basilar artery and are well opacified to their distal aspects. Distal small vessel disease within the PCA branches bilaterally. There is a more focal short-segment moderate stenosis within the proximal right P2 segment (series 505, image 15). No aneurysm or vascular malformation. IMPRESSION: MRI HEAD IMPRESSION: 1. Small volume patchy cortical and subcortical ischemic infarcts within the left frontal and right frontoparietal region, with additional tiny cortical infarct within the right occipital lobe. Given the distribution of these infarcts, an underlying watershed etiology is favored. No associated hemorrhage or mass effect. 2.  Scattered small chronic micro hemorrhages as above. While these might be related to chronic underlying hypertension, possible amyloid angiopathy could also be considered given their somewhat peripheral distribution. 3. Atrophy with chronic microvascular ischemic disease. MRA HEAD IMPRESSION: 1. No large vessel or proximal arterial branch occlusion identified. 2. Short-segment moderate stenosis within the supraclinoid left ICA. 3. Short-segment moderate stenosis within the proximal right P2 segment. 4. Distal small vessel atheromatous disease within the MCA, ACA, and PCA branches bilaterally Electronically Signed   By: Jeannine Boga M.D.   On: 08/28/2015 02:26   Mr Brain Wo Contrast  08/28/2015  CLINICAL DATA:  Initial evaluation for transient  left-sided weakness following an episode of unresponsiveness and hypotension. EXAM: MRI HEAD WITHOUT CONTRAST MRA HEAD WITHOUT CONTRAST TECHNIQUE: Multiplanar, multiecho pulse sequences of the brain and surrounding structures were obtained without intravenous contrast. Angiographic images of the head were obtained using MRA technique without contrast. COMPARISON:  Prior CT from 08/27/2015. FINDINGS: MRI HEAD FINDINGS Diffuse prominence of the CSF containing spaces is compatible with generalized age-related cerebral atrophy. Patchy and confluent T2/FLAIR hyperintensity within the periventricular and deep white matter both cerebral hemispheres most consistent with chronic small vessel ischemic disease. Small remote lacunar infarct within the periventricular white matter of the left corona radiata. There is a few small foci of patchy restricted diffusion within the high left frontal lobe and right frontoparietal regions bilaterally (series 4, image 33, 35). Additional tiny punctate cortical infarct more inferiorly within the right occipital lobe (series 4, image 26). Associated signal loss seen on corresponding ADC map. No associated hemorrhage or mass effect. Bodies  consistent with small acute ischemic infarcts. Given the distribution of these findings, underlying watershed etiology is favored. Normal intravascular flow voids are maintained. Additional scattered subcentimeter foci of susceptibility artifact seen in within the peripheral cortices of the bilateral cerebral hemispheres on gradient echo sequence, consistent with small chronic micro hemorrhages. While these could potentially be related underlying hypertension, possible amyloid angiopathy could be considered given their somewhat peripheral distribution. No mass lesion, midline shift, or mass effect. No hydrocephalus. No extra-axial fluid collection. Craniocervical junction within normal limits. Mild degenerative spondylolysis noted within the visualized upper cervical spine. Pituitary gland normal. Thinning of the anterior body of the corpus callosum noted, which may related to remote ischemia. No acute abnormality about the orbits. Mild right-sided exophthalmos. Paranasal sinuses are clear. No mastoid effusion. Inner ear structures within normal limits. Bone marrow signal intensity within normal limits. No scalp soft tissue abnormality. MRA HEAD FINDINGS ANTERIOR CIRCULATION: Visualized distal cervical segments of the internal carotid arteries are patent with antegrade flow. Petrous segments widely patent. Scattered multi focal atheromatous irregularity within the cavernous and supraclinoid left ICA. There is a short-segment moderate stenosis at the proximal supraclinoid left ICA (series 5, image 72). Cavernous and supraclinoid right ICA widely patent. Right A1 segment widely patent. Left A1 segment slightly hypoplastic. Anterior communicating artery patent. Atheromatous irregularity within the anterior cerebral arteries bilaterally. M1 segments widely patent without stenosis or occlusion. Atheromatous irregularity within the left M1 segment. MCA bifurcations within normal limits. MCA branches symmetric bilaterally.  Distal small vessel disease present within the MCA branches bilaterally. POSTERIOR CIRCULATION: Vertebral arteries patent to the vertebrobasilar junction. Right vertebral artery slightly diminutive. Posterior inferior cerebral arteries not well evaluated on this exam. Basilar artery widely patent. Small left anterior inferior cerebral artery noted. Superior cerebellar arteries patent. Both sub posterior cerebral arteries arise from the basilar artery and are well opacified to their distal aspects. Distal small vessel disease within the PCA branches bilaterally. There is a more focal short-segment moderate stenosis within the proximal right P2 segment (series 505, image 15). No aneurysm or vascular malformation. IMPRESSION: MRI HEAD IMPRESSION: 1. Small volume patchy cortical and subcortical ischemic infarcts within the left frontal and right frontoparietal region, with additional tiny cortical infarct within the right occipital lobe. Given the distribution of these infarcts, an underlying watershed etiology is favored. No associated hemorrhage or mass effect. 2. Scattered small chronic micro hemorrhages as above. While these might be related to chronic underlying hypertension, possible amyloid angiopathy could also be considered given their somewhat peripheral distribution. 3.  Atrophy with chronic microvascular ischemic disease. MRA HEAD IMPRESSION: 1. No large vessel or proximal arterial branch occlusion identified. 2. Short-segment moderate stenosis within the supraclinoid left ICA. 3. Short-segment moderate stenosis within the proximal right P2 segment. 4. Distal small vessel atheromatous disease within the MCA, ACA, and PCA branches bilaterally Electronically Signed   By: Jeannine Boga M.D.   On: 08/28/2015 02:26   Dg Chest Port 1 View  08/28/2015  CLINICAL DATA:  Shortness of breath.  Pulmonary edema. EXAM: PORTABLE CHEST 1 VIEW COMPARISON:  08/26/2015. FINDINGS: Left IJ line in stable position.  Cardiomegaly with bilateral pulmonary alveolar infiltrates. Slight improvement. Persistent small pleural effusions. No pneumothorax. IMPRESSION: 1. Left IJ line in stable position. 2. Cardiomegaly with persistent bilateral pulmonary infiltrates, partial clearing from prior exam. Persistent small pleural effusions. Electronically Signed   By: Marcello Moores  Register   On: 08/28/2015 07:18    Medical Problem List and Plan: 1. 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  DVT Prophylaxis/Anticoagulation: SCDs. 3. Pain Management: Tylenol 4. CAD.Medical management with aspirin therapy 5. Neuropsych: This patient is capable of making decisions on her own behalf. 6. Skin/Wound Care: Routine skin checks 7. Fluids/Electrolytes/Nutrition: Routine eyewith follow-up chemistries 8. Hypertension.Bidil 20-30 7.5 mg 3 times a day, Coreg 12.5 mg twice a day,Entresto 24-26 milligrams twice a day. Monitor with increased mobility 9. Viral illness. Continue Zithromax 3 doses 10. Hyperlipidemia. Lipitor   Post Admission Physician Evaluation: 1. 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   2. Patient is admitted to receive collaborative, interdisciplinary care between the physiatrist, rehab nursing staff, and therapy team. 3. Patient's level of medical complexity and substantial therapy needs in context of that medical necessity cannot be provided at a lesser intensity of care such as a SNF. 4. Patient has experienced substantial functional loss from his/her baseline which was documented above under the "Functional History" and "Functional Status" headings.  Judging by the patient's diagnosis, physical exam, and functional history, the patient has potential for functional progress which will result in measurable gains while on inpatient rehab.  These gains will be of substantial and practical use upon discharge  in  facilitating mobility and self-care at the household level. 5. Physiatrist will provide 24 hour management of medical needs as well as oversight of the therapy plan/treatment and provide guidance as appropriate regarding the interaction of the two. 6. 24 hour rehab nursing will assist with safety, disease management, medication administration, pain management and patient education  and help integrate therapy concepts, techniques,education, etc. 7. PT will assess and treat for/with: Lower extremity strength, range of motion, stamina, balance, functional mobility, safety, adaptive techniques and equipment, woundcare, coping skills, pain control, and stroke education.   Goals are: Supervision. 8. OT will assess and treat for/with: ADL's, functional mobility, safety, upper extremity strength, adaptive techniques and equipment, wound mgt, ego support, and, stroke education, and community reintegration.   Goals are: Supervision/Mod I. Therapy may proceed with showering this patient. 9. Case Management and Social Worker will assess and treat for psychological issues and discharge planning. 10. Team conference will be held weekly to assess progress toward goals and to determine barriers to discharge. 11. Patient will receive at least 3 hours of therapy per day at least 5 days per week. 12. ELOS: 7-10 days.       13. Prognosis:  excellent  Delice Lesch, MD  08/29/2015

## 2015-08-29 NOTE — Evaluation (Signed)
Physical Therapy Evaluation Patient Details Name: Julie Stein MRN: [id number] DOB: [birth year] Today's Date: 08/29/2015   History of Present Illness  Julie Stein is an 80 y.o. female with a history of hypertension and poor compliance, admitted with acute respiratory failure and systolic heart failure. Patient was started on BiPAP for respiratory support. She experienced an episode of hypotension and unresponsiveness following admission on 08/27/15. On becoming responsive again, she was unable to move her left extremities transiently. MRI revealed watershed infarcts.   She has no history of stroke nor TIA. Left-sided weakness appeared resolved however pt with poor left hemibody coordination as well as decr proprioception.    Clinical Impression  Pt admitted with above diagnosis. Pt currently with functional limitations due to the deficits listed below (see PT Problem List). Pt ambulated with mod assist with poor postural stability overall.  Significantly impaired mobility from prior to admission as pt was gardening and fully independent.  Feel that REhab is a great option for patient as she has a very supportive family.  Will follow acutely.   Pt will benefit from skilled PT to increase their independence and safety with mobility to allow discharge to the venue listed below.      Follow Up Recommendations CIR;Supervision/Assistance - 24 hour    Equipment Recommendations  Other (comment) (TBA)    Recommendations for Other Services Rehab consult     Precautions / Restrictions Precautions Precautions: Fall Restrictions Weight Bearing Restrictions: No      Mobility  Bed Mobility               General bed mobility comments: Pt in chair on arrival.   Transfers Overall transfer level: Needs assistance Equipment used: Rolling walker (2 wheeled) Transfers: Sit to/from Stand Sit to Stand: Mod assist;+2 physical assistance         General transfer comment: Pt able to perform sit to  stand with mod assist and cues as pt leans to left and has difficulty with coordination of movement on left hemibody.    Ambulation/Gait Ambulation/Gait assistance: Mod assist;+2 physical assistance Ambulation Distance (Feet): 18 Feet Assistive device: Rolling walker (2 wheeled);2 person hand held assist Gait Pattern/deviations: Step-to pattern;Decreased stride length;Decreased step length - left;Decreased stance time - left;Decreased stance time - right;Decreased weight shift to right;Decreased weight shift to left;Ataxic;Staggering left;Staggering right;Trunk flexed;Narrow base of support   Gait velocity interpretation: Below normal speed for age/gender General Gait Details: Pt with very poor postural stability overall.  Pt unable to maintain upright trunk with left lateral lean throughout.  Pt needed cues and assist to move left LE as pt could not coordinate the LE.  Also needed assist to weight shift to right to unweight left hemibody.  Question whether pt had decr awareness of right hemibody as well with questionable visual changes in right visual field.  With ambulation, PT giving heavy assist at times to keep pt upright.  Initially pt was ambulating without device first 6 steps and then used RW.  Even with RW, pt still required extensive assist.  Pt's family in room and PT had family assist pt with ambulation so they were aware of the assist given as intially they were convinced pt could go home, however once they tried to assist pt, they were agreeable that pt needs further therapy prior to d/c home.    Stairs            Wheelchair Mobility    Modified Rankin (Stroke Patients Only) Modified Rankin (Stroke Patients Only)  Pre-Morbid Rankin Score: No symptoms Modified Rankin: Severe disability     Balance Overall balance assessment: Needs assistance       Postural control: Left lateral lean Standing balance support: Bilateral upper extremity supported;During functional  activity Standing balance-Leahy Scale: Poor Standing balance comment: Pt needs mod assist to stand with left lateral lean. Must have bil UE support.                                Pertinent Vitals/Pain Pain Assessment: No/denies pain  VSS    Home Living Family/patient expects to be discharged to:: Private residence Living Arrangements: Other relatives (lives with grandson and his wife, has a large family) Available Help at Discharge: Family;Available 24 hours/day Type of Home: House Home Access: Stairs to enter Entrance Stairs-Rails: Right;Left;Can reach both Entrance Stairs-Number of Steps: 6 Home Layout: One level Home Equipment: None Additional Comments: Pt was gardening, doing yard work, cleaned house - was fully independent PTA.     Prior Function Level of Independence: Independent               Hand Dominance        Extremity/Trunk Assessment   Upper Extremity Assessment: Defer to OT evaluation           Lower Extremity Assessment: LLE deficits/detail   LLE Deficits / Details: grossly 3+/5  Cervical / Trunk Assessment: Kyphotic;Other exceptions (leans to left )  Communication   Communication: No difficulties  Cognition Arousal/Alertness: Awake/alert Behavior During Therapy: Flat affect Overall Cognitive Status: Impaired/Different from baseline Area of Impairment: Following commands;Safety/judgement;Awareness;Problem solving       Following Commands: Follows one step commands inconsistently Safety/Judgement: Decreased awareness of safety;Decreased awareness of deficits Awareness: Intellectual Problem Solving: Difficulty sequencing;Requires verbal cues;Requires tactile cues General Comments: Pt had difficulty following commands with delayed processing of information at times.  Pt had trouble following commands for eye movements.      General Comments      Exercises General Exercises - Upper Extremity Shoulder Flexion: AROM;Both;5  reps;Seated Elbow Flexion: AROM;Both;5 reps;Seated General Exercises - Lower Extremity Ankle Circles/Pumps: AROM;Both;5 reps;Seated Quad Sets: AROM;Both;5 reps;Seated Long Arc Quad: AROM;Both;5 reps;Seated Hip Flexion/Marching: AROM;Both;5 reps;Seated      Assessment/Plan    PT Assessment Patient needs continued PT services  PT Diagnosis Generalized weakness   PT Problem List Decreased activity tolerance;Decreased balance;Decreased mobility;Decreased coordination;Decreased knowledge of use of DME;Decreased safety awareness;Decreased knowledge of precautions  PT Treatment Interventions DME instruction;Gait training;Functional mobility training;Therapeutic activities;Therapeutic exercise;Balance training;Patient/family education   PT Goals (Current goals can be found in the Care Plan section) Acute Rehab PT Goals Patient Stated Goal: to go home after rehab PT Goal Formulation: With patient Time For Goal Achievement: 09/12/15 Potential to Achieve Goals: Good    Frequency Min 4X/week   Barriers to discharge        Co-evaluation               End of Session Equipment Utilized During Treatment: Gait belt Activity Tolerance: Patient limited by fatigue Patient left: in chair;with call bell/phone within reach;with family/visitor present Nurse Communication: Mobility status         Time: 1035-1100 PT Time Calculation (min) (ACUTE ONLY): 25 min   Charges:   PT Evaluation $Initial PT Evaluation Tier I: 1 Procedure PT Treatments $Gait Training: 8-22 mins   PT G Codes:        Garrin Kirwan F 03-Sep-2015, 11:49 AM Mckell Riecke  Jayquan Bradsher,PT Acute Rehabilitation [phone] (pager)

## 2015-08-29 NOTE — Consult Note (Signed)
Physical Medicine and Rehabilitation Consult Reason for Consult: Watershed pattern infarct Referring Physician: [facility]   HPI: Julie Stein is a 80 y.o. right handed female history of hypertension. Patient lives with grandson and his wife large extended supportive family. Independent prior to admission and active. One level home 6 steps to entry. Presented 08/26/2015 with sudden onset of shortness of breath noted recent bouts of diarrhea, congestion and cough. Chest x-ray shows significant pulmonary edema. EKG with frequent PVCs bigeminy 2 runs of SVT heart rate 200 bpm. She became hypotensive placed on amiodarone drip. Bedside echocardiogram with ejection fraction 20-25% diffuse hypokinesis, RV functioning well. Placed on broad-spectrum antibiotic suspect viral illness. Troponin mildly elevated 0.22 days 0.29. Cardiology service is consulted and underwent cardiac catheterization showing severe distal LAD stenosis moderate ostial large OMI stenosis. Advised medical management. Post catheterization noted left-sided weakness. MRI of the brain showed small volume patchy cortical and subcortical ischemic infarcts within the left frontal and right frontoparietal region with additional tiny cortical infarcts within the right occipital lobe. MRA of the head with no major vessel occlusion or stenosis. Placed on aspirin for CVA prophylaxis. Maintained on contact droplet precautions. Physical therapy evaluation completed 08/29/2015 with recommendations of physical medicine rehabilitation consult.   Review of Systems  Constitutional: Negative for fever and chills.  HENT: Negative for hearing loss.   Eyes: Positive for blurred vision. Negative for double vision.  Respiratory: Positive for shortness of breath.   Cardiovascular: Negative for chest pain and palpitations.  Gastrointestinal: Negative for vomiting.  Genitourinary: Negative for dysuria and hematuria.  Skin: Negative for rash.  Neurological:  Negative for seizures, loss of consciousness and headaches.  All other systems reviewed and are negative.  Past Medical History  Diagnosis Date  . Hypertension    Past Surgical History  Procedure Laterality Date  . Cardiac catheterization N/A 08/27/2015    Procedure: Right/Left Heart Cath and Coronary Angiography;  Surgeon: Laurey Morale, MD;  Location: [facility] INVASIVE CV LAB;  Service: Cardiovascular;  Laterality: N/A;   History reviewed. No pertinent family history. Social History:  reports that she has never smoked. She does not have any smokeless tobacco history on file. She reports that she does not drink alcohol or use illicit drugs. Allergies:  Allergies  Allergen Reactions  . Demerol [Meperidine]     intolerance   Medications Prior to Admission  Medication Sig Dispense Refill  . COLLAGEN PO Take 1 capsule by mouth daily.    . Multiple Vitamins-Minerals (MULTIVITAMIN) tablet Take 1 tablet by mouth daily.      Home: Home Living Family/patient expects to be discharged to:: Private residence Living Arrangements: Other relatives (lives with grandson and his wife, has a large family) Available Help at Discharge: Family, Available 24 hours/day Type of Home: House Home Access: Stairs to enter [employer] of Steps: 6 Entrance Stairs-Rails: Right, Left, Can reach both Home Layout: One level Bathroom Shower/Tub: Health visitor: Standard Bathroom Accessibility: Yes Home Equipment: None Additional Comments: Pt was gardening, doing yard work, cleaned house - was fully independent PTA.   Functional History: Prior Function Level of Independence: Independent Functional Status:  Mobility: Bed Mobility General bed mobility comments: Pt in chair on arrival.  Transfers Overall transfer level: Needs assistance Equipment used: Rolling walker (2 wheeled) Transfers: Sit to/from Stand Sit to Stand: Mod assist, +2 physical assistance General transfer  comment: Pt able to perform sit to stand with mod assist and cues as pt leans to  left and has difficulty with coordination of movement on left hemibody.   Ambulation/Gait Ambulation/Gait assistance: Mod assist, +2 physical assistance Ambulation Distance (Feet): 18 Feet Assistive device: Rolling walker (2 wheeled), 2 person hand held assist Gait Pattern/deviations: Step-to pattern, Decreased stride length, Decreased step length - left, Decreased stance time - left, Decreased stance time - right, Decreased weight shift to right, Decreased weight shift to left, Ataxic, Staggering left, Staggering right, Trunk flexed, Narrow base of support General Gait Details: Pt with very poor postural stability overall.  Pt unable to maintain upright trunk with left lateral lean throughout.  Pt needed cues and assist to move left LE as pt could not coordinate the LE.  Also needed assist to weight shift to right to unweight left hemibody.  Question whether pt had decr awareness of right hemibody as well with questionable visual changes in right visual field.  With ambulation, PT giving heavy assist at times to keep pt upright.  Initially pt was ambulating without device first 6 steps and then used RW.  Even with RW, pt still required extensive assist.  Pt's family in room and PT had family assist pt with ambulation so they were aware of the assist given as intially they were convinced pt could go home, however once they tried to assist pt, they were agreeable that pt needs further therapy prior to d/c home.   Gait velocity interpretation: Below normal speed for age/gender    ADL:    Cognition: Cognition Overall Cognitive Status: Impaired/Different from baseline Orientation Level: Oriented X4 Cognition Arousal/Alertness: Awake/alert Behavior During Therapy: Flat affect Overall Cognitive Status: Impaired/Different from baseline Area of Impairment: Following commands, Safety/judgement, Awareness, Problem  solving Following Commands: Follows one step commands inconsistently Safety/Judgement: Decreased awareness of safety, Decreased awareness of deficits Awareness: Intellectual Problem Solving: Difficulty sequencing, Requires verbal cues, Requires tactile cues General Comments: Pt had difficulty following commands with delayed processing of information at times.  Pt had trouble following commands for eye movements.    Blood pressure 140/94, pulse 113, temperature 97.7 F (36.5 C), temperature source Oral, resp. rate 15, height 4\' 11"  (1.499 m), weight 49.6 kg (109 lb 5.6 oz), SpO2 94 %. Physical Exam  Vitals reviewed. Constitutional: She is oriented to person, place, and time. She appears well-developed and well-nourished.  HENT:  Head: Normocephalic and atraumatic.  Eyes: Conjunctivae and EOM are normal.  Negative nystagmus  Neck: Normal range of motion. Neck supple. No thyromegaly present.  Cardiovascular: Normal rate and regular rhythm.   Respiratory: Effort normal and breath sounds normal. No respiratory distress.  GI: Soft. Bowel sounds are normal. She exhibits no distension. There is no tenderness.  Musculoskeletal: She exhibits no edema.  PROM WNL B/l UE 4+/5 grossly RLE 4+/5 grossly LLE hip flex 4/5, ankle dorsi/plantar flexion 4+/5  Neurological: She is alert and oriented to person, place, and time. She has normal reflexes.  Makes good eye contact with examiner and follows full commands. Fair awareness of deficits Left trunk lean Sensation to light touch grossly intact throughout  Skin: Skin is warm and dry.  Psychiatric: She has a normal mood and affect. Her behavior is normal.    Results for orders placed or performed during the hospital encounter of 08/26/15 (from the past 24 hour(s))  Glucose, capillary     Status: Abnormal   Collection Time: 08/28/15  4:30 PM  Result Value Ref Range   Glucose-Capillary 138 (H) 65 - 99 mg/dL   Comment 1 Capillary Specimen  Glucose,  capillary     Status: Abnormal   Collection Time: 08/28/15  8:39 PM  Result Value Ref Range   Glucose-Capillary 130 (H) 65 - 99 mg/dL   Comment 1 Capillary Specimen   Glucose, capillary     Status: None   Collection Time: 08/28/15 11:42 PM  Result Value Ref Range   Glucose-Capillary 95 65 - 99 mg/dL   Comment 1 Capillary Specimen   Glucose, capillary     Status: Abnormal   Collection Time: 08/29/15  4:28 AM  Result Value Ref Range   Glucose-Capillary 123 (H) 65 - 99 mg/dL   Comment 1 Venous Specimen   CBC     Status: Abnormal   Collection Time: 08/29/15  4:42 AM  Result Value Ref Range   WBC 12.2 (H) 4.0 - 10.5 K/uL   RBC 4.05 3.87 - 5.11 MIL/uL   Hemoglobin 9.5 (L) 12.0 - 15.0 g/dL   HCT 16.1 (L) 09.6 - 04.5 %   MCV 72.6 (L) 78.0 - 100.0 fL   MCH 23.5 (L) 26.0 - 34.0 pg   MCHC 32.3 30.0 - 36.0 g/dL   RDW 40.9 (H) 81.1 - 91.4 %   Platelets 225 150 - 400 K/uL  Basic metabolic panel     Status: Abnormal   Collection Time: 08/29/15  4:42 AM  Result Value Ref Range   Sodium 139 135 - 145 mmol/L   Potassium 3.7 3.5 - 5.1 mmol/L   Chloride 103 101 - 111 mmol/L   CO2 26 22 - 32 mmol/L   Glucose, Bld 128 (H) 65 - 99 mg/dL   BUN 14 6 - 20 mg/dL   Creatinine, Ser 7.82 (H) 0.44 - 1.00 mg/dL   Calcium 9.2 8.9 - 95.6 mg/dL   GFR calc non Af Amer 46 (L) >60 mL/min   GFR calc Af Amer 53 (L) >60 mL/min   Anion gap 10 5 - 15  Glucose, capillary     Status: None   Collection Time: 08/29/15  7:37 AM  Result Value Ref Range   Glucose-Capillary 75 65 - 99 mg/dL   Comment 1 Capillary Specimen   Glucose, capillary     Status: Abnormal   Collection Time: 08/29/15 11:12 AM  Result Value Ref Range   Glucose-Capillary 112 (H) 65 - 99 mg/dL   Comment 1 Capillary Specimen    Mr Maxine Glenn Head Wo Contrast  08/28/2015  CLINICAL DATA:  Initial evaluation for transient left-sided weakness following an episode of unresponsiveness and hypotension. EXAM: MRI HEAD WITHOUT CONTRAST MRA HEAD WITHOUT  CONTRAST TECHNIQUE: Multiplanar, multiecho pulse sequences of the brain and surrounding structures were obtained without intravenous contrast. Angiographic images of the head were obtained using MRA technique without contrast. COMPARISON:  Prior CT from 08/27/2015. FINDINGS: MRI HEAD FINDINGS Diffuse prominence of the CSF containing spaces is compatible with generalized age-related cerebral atrophy. Patchy and confluent T2/FLAIR hyperintensity within the periventricular and deep white matter both cerebral hemispheres most consistent with chronic small vessel ischemic disease. Small remote lacunar infarct within the periventricular white matter of the left corona radiata. There is a few small foci of patchy restricted diffusion within the high left frontal lobe and right frontoparietal regions bilaterally (series 4, image 33, 35). Additional tiny punctate cortical infarct more inferiorly within the right occipital lobe (series 4, image 26). Associated signal loss seen on corresponding ADC map. No associated hemorrhage or mass effect. Bodies consistent with small acute ischemic infarcts. Given the distribution of these findings, underlying  watershed etiology is favored. Normal intravascular flow voids are maintained. Additional scattered subcentimeter foci of susceptibility artifact seen in within the peripheral cortices of the bilateral cerebral hemispheres on gradient echo sequence, consistent with small chronic micro hemorrhages. While these could potentially be related underlying hypertension, possible amyloid angiopathy could be considered given their somewhat peripheral distribution. No mass lesion, midline shift, or mass effect. No hydrocephalus. No extra-axial fluid collection. Craniocervical junction within normal limits. Mild degenerative spondylolysis noted within the visualized upper cervical spine. Pituitary gland normal. Thinning of the anterior body of the corpus callosum noted, which may related to  remote ischemia. No acute abnormality about the orbits. Mild right-sided exophthalmos. Paranasal sinuses are clear. No mastoid effusion. Inner ear structures within normal limits. Bone marrow signal intensity within normal limits. No scalp soft tissue abnormality. MRA HEAD FINDINGS ANTERIOR CIRCULATION: Visualized distal cervical segments of the internal carotid arteries are patent with antegrade flow. Petrous segments widely patent. Scattered multi focal atheromatous irregularity within the cavernous and supraclinoid left ICA. There is a short-segment moderate stenosis at the proximal supraclinoid left ICA (series 5, image 72). Cavernous and supraclinoid right ICA widely patent. Right A1 segment widely patent. Left A1 segment slightly hypoplastic. Anterior communicating artery patent. Atheromatous irregularity within the anterior cerebral arteries bilaterally. M1 segments widely patent without stenosis or occlusion. Atheromatous irregularity within the left M1 segment. MCA bifurcations within normal limits. MCA branches symmetric bilaterally. Distal small vessel disease present within the MCA branches bilaterally. POSTERIOR CIRCULATION: Vertebral arteries patent to the vertebrobasilar junction. Right vertebral artery slightly diminutive. Posterior inferior cerebral arteries not well evaluated on this exam. Basilar artery widely patent. Small left anterior inferior cerebral artery noted. Superior cerebellar arteries patent. Both sub posterior cerebral arteries arise from the basilar artery and are well opacified to their distal aspects. Distal small vessel disease within the PCA branches bilaterally. There is a more focal short-segment moderate stenosis within the proximal right P2 segment (series 505, image 15). No aneurysm or vascular malformation. IMPRESSION: MRI HEAD IMPRESSION: 1. Small volume patchy cortical and subcortical ischemic infarcts within the left frontal and right frontoparietal region, with  additional tiny cortical infarct within the right occipital lobe. Given the distribution of these infarcts, an underlying watershed etiology is favored. No associated hemorrhage or mass effect. 2. Scattered small chronic micro hemorrhages as above. While these might be related to chronic underlying hypertension, possible amyloid angiopathy could also be considered given their somewhat peripheral distribution. 3. Atrophy with chronic microvascular ischemic disease. MRA HEAD IMPRESSION: 1. No large vessel or proximal arterial branch occlusion identified. 2. Short-segment moderate stenosis within the supraclinoid left ICA. 3. Short-segment moderate stenosis within the proximal right P2 segment. 4. Distal small vessel atheromatous disease within the MCA, ACA, and PCA branches bilaterally Electronically Signed   By: Rise Mu M.D.   On: 08/28/2015 02:26   Mr Brain Wo Contrast  08/28/2015  CLINICAL DATA:  Initial evaluation for transient left-sided weakness following an episode of unresponsiveness and hypotension. EXAM: MRI HEAD WITHOUT CONTRAST MRA HEAD WITHOUT CONTRAST TECHNIQUE: Multiplanar, multiecho pulse sequences of the brain and surrounding structures were obtained without intravenous contrast. Angiographic images of the head were obtained using MRA technique without contrast. COMPARISON:  Prior CT from 08/27/2015. FINDINGS: MRI HEAD FINDINGS Diffuse prominence of the CSF containing spaces is compatible with generalized age-related cerebral atrophy. Patchy and confluent T2/FLAIR hyperintensity within the periventricular and deep white matter both cerebral hemispheres most consistent with chronic small vessel ischemic disease. Small remote  lacunar infarct within the periventricular white matter of the left corona radiata. There is a few small foci of patchy restricted diffusion within the high left frontal lobe and right frontoparietal regions bilaterally (series 4, image 33, 35). Additional tiny  punctate cortical infarct more inferiorly within the right occipital lobe (series 4, image 26). Associated signal loss seen on corresponding ADC map. No associated hemorrhage or mass effect. Bodies consistent with small acute ischemic infarcts. Given the distribution of these findings, underlying watershed etiology is favored. Normal intravascular flow voids are maintained. Additional scattered subcentimeter foci of susceptibility artifact seen in within the peripheral cortices of the bilateral cerebral hemispheres on gradient echo sequence, consistent with small chronic micro hemorrhages. While these could potentially be related underlying hypertension, possible amyloid angiopathy could be considered given their somewhat peripheral distribution. No mass lesion, midline shift, or mass effect. No hydrocephalus. No extra-axial fluid collection. Craniocervical junction within normal limits. Mild degenerative spondylolysis noted within the visualized upper cervical spine. Pituitary gland normal. Thinning of the anterior body of the corpus callosum noted, which may related to remote ischemia. No acute abnormality about the orbits. Mild right-sided exophthalmos. Paranasal sinuses are clear. No mastoid effusion. Inner ear structures within normal limits. Bone marrow signal intensity within normal limits. No scalp soft tissue abnormality. MRA HEAD FINDINGS ANTERIOR CIRCULATION: Visualized distal cervical segments of the internal carotid arteries are patent with antegrade flow. Petrous segments widely patent. Scattered multi focal atheromatous irregularity within the cavernous and supraclinoid left ICA. There is a short-segment moderate stenosis at the proximal supraclinoid left ICA (series 5, image 72). Cavernous and supraclinoid right ICA widely patent. Right A1 segment widely patent. Left A1 segment slightly hypoplastic. Anterior communicating artery patent. Atheromatous irregularity within the anterior cerebral arteries  bilaterally. M1 segments widely patent without stenosis or occlusion. Atheromatous irregularity within the left M1 segment. MCA bifurcations within normal limits. MCA branches symmetric bilaterally. Distal small vessel disease present within the MCA branches bilaterally. POSTERIOR CIRCULATION: Vertebral arteries patent to the vertebrobasilar junction. Right vertebral artery slightly diminutive. Posterior inferior cerebral arteries not well evaluated on this exam. Basilar artery widely patent. Small left anterior inferior cerebral artery noted. Superior cerebellar arteries patent. Both sub posterior cerebral arteries arise from the basilar artery and are well opacified to their distal aspects. Distal small vessel disease within the PCA branches bilaterally. There is a more focal short-segment moderate stenosis within the proximal right P2 segment (series 505, image 15). No aneurysm or vascular malformation. IMPRESSION: MRI HEAD IMPRESSION: 1. Small volume patchy cortical and subcortical ischemic infarcts within the left frontal and right frontoparietal region, with additional tiny cortical infarct within the right occipital lobe. Given the distribution of these infarcts, an underlying watershed etiology is favored. No associated hemorrhage or mass effect. 2. Scattered small chronic micro hemorrhages as above. While these might be related to chronic underlying hypertension, possible amyloid angiopathy could also be considered given their somewhat peripheral distribution. 3. Atrophy with chronic microvascular ischemic disease. MRA HEAD IMPRESSION: 1. No large vessel or proximal arterial branch occlusion identified. 2. Short-segment moderate stenosis within the supraclinoid left ICA. 3. Short-segment moderate stenosis within the proximal right P2 segment. 4. Distal small vessel atheromatous disease within the MCA, ACA, and PCA branches bilaterally Electronically Signed   By: Rise Mu M.D.   On: 08/28/2015  02:26   Dg Chest Port 1 View  08/28/2015  CLINICAL DATA:  Shortness of breath.  Pulmonary edema. EXAM: PORTABLE CHEST 1 VIEW COMPARISON:  08/26/2015. FINDINGS:  Left IJ line in stable position. Cardiomegaly with bilateral pulmonary alveolar infiltrates. Slight improvement. Persistent small pleural effusions. No pneumothorax. IMPRESSION: 1. Left IJ line in stable position. 2. Cardiomegaly with persistent bilateral pulmonary infiltrates, partial clearing from prior exam. Persistent small pleural effusions. Electronically Signed   By: Maisie Fus  Register   On: 08/28/2015 07:18    Assessment/Plan: Pt meets criteria for admission to inpatient rehab today.  Pt currently Mod Assist +2 with PT for most tasks.  Would anticipate pt would reach supervision level within 7-10 days.  Will admit today.     Maryla Morrow, MD 08/29/2015

## 2015-08-30 ENCOUNTER — Inpatient Hospital Stay (HOSPITAL_COMMUNITY): Payer: Medicare Other | Admitting: Physical Therapy

## 2015-08-30 ENCOUNTER — Inpatient Hospital Stay (HOSPITAL_COMMUNITY): Payer: Medicare Other | Admitting: Occupational Therapy

## 2015-08-30 ENCOUNTER — Ambulatory Visit (HOSPITAL_COMMUNITY): Payer: Medicare Other | Attending: Cardiology

## 2015-08-30 DIAGNOSIS — I638 Other cerebral infarction: Secondary | ICD-10-CM

## 2015-08-30 DIAGNOSIS — I69398 Other sequelae of cerebral infarction: Secondary | ICD-10-CM

## 2015-08-30 DIAGNOSIS — I5023 Acute on chronic systolic (congestive) heart failure: Secondary | ICD-10-CM | POA: Insufficient documentation

## 2015-08-30 DIAGNOSIS — R269 Unspecified abnormalities of gait and mobility: Secondary | ICD-10-CM

## 2015-08-30 DIAGNOSIS — I10 Essential (primary) hypertension: Secondary | ICD-10-CM | POA: Insufficient documentation

## 2015-08-30 LAB — COMPREHENSIVE METABOLIC PANEL
ALBUMIN: 2.4 g/dL — AB (ref 3.5–5.0)
ALK PHOS: 63 U/L (ref 38–126)
ALT: 19 U/L (ref 14–54)
AST: 27 U/L (ref 15–41)
Anion gap: 10 (ref 5–15)
BILIRUBIN TOTAL: 0.6 mg/dL (ref 0.3–1.2)
BUN: 12 mg/dL (ref 6–20)
CALCIUM: 9.4 mg/dL (ref 8.9–10.3)
CO2: 25 mmol/L (ref 22–32)
Chloride: 104 mmol/L (ref 101–111)
Creatinine, Ser: 1.04 mg/dL — ABNORMAL HIGH (ref 0.44–1.00)
GFR calc Af Amer: 58 mL/min — ABNORMAL LOW (ref >60)
GFR calc non Af Amer: 50 mL/min — ABNORMAL LOW (ref >60)
GLUCOSE: 113 mg/dL — AB (ref 65–99)
POTASSIUM: 3.8 mmol/L (ref 3.5–5.1)
Sodium: 139 mmol/L (ref 135–145)
TOTAL PROTEIN: 6 g/dL — AB (ref 6.5–8.1)

## 2015-08-30 LAB — CBC WITH DIFFERENTIAL/PLATELET
BASOS ABS: 0 10*3/uL (ref 0.0–0.1)
BASOS PCT: 0 %
Eosinophils Absolute: 0.3 10*3/uL (ref 0.0–0.7)
Eosinophils Relative: 3 %
HEMATOCRIT: 31.3 % — AB (ref 36.0–46.0)
HEMOGLOBIN: 10.2 g/dL — AB (ref 12.0–15.0)
LYMPHS PCT: 19 %
Lymphs Abs: 1.8 10*3/uL (ref 0.7–4.0)
MCH: 23.6 pg — ABNORMAL LOW (ref 26.0–34.0)
MCHC: 32.6 g/dL (ref 30.0–36.0)
MCV: 72.5 fL — ABNORMAL LOW (ref 78.0–100.0)
Monocytes Absolute: 0.6 10*3/uL (ref 0.1–1.0)
Monocytes Relative: 6 %
NEUTROS ABS: 7 10*3/uL (ref 1.7–7.7)
Neutrophils Relative %: 71 %
Platelets: 227 10*3/uL (ref 150–400)
RBC: 4.32 MIL/uL (ref 3.87–5.11)
RDW: 16.1 % — ABNORMAL HIGH (ref 11.5–15.5)
WBC: 9.8 10*3/uL (ref 4.0–10.5)

## 2015-08-30 LAB — TROPONIN I: Troponin I: 0.05 ng/mL — ABNORMAL HIGH (ref <0.031)

## 2015-08-30 MED ORDER — NITROGLYCERIN 0.4 MG SL SUBL: 0.4000 mg | SUBLINGUAL_TABLET | SUBLINGUAL | Status: DC | PRN

## 2015-08-30 MED ORDER — TRAZODONE HCL 50 MG PO TABS
50.0000 mg | ORAL_TABLET | Freq: Every evening | ORAL | Status: DC | PRN
  Administered 2015-08-30: 50 mg via ORAL
  Filled 2015-08-30: qty 1

## 2015-08-30 NOTE — Progress Notes (Signed)
80 y.o. right handed female history of hypertension. Patient lives with grandson and his wife large extended supportive family. Independent prior to admission and active. One level home 6 steps to entry. Presented 08/26/2015 with sudden onset of shortness of breath noted recent bouts of diarrhea, congestion and cough. Chest x-ray shows significant pulmonary edema. EKG with frequent PVCs bigeminy 2 runs of SVT heart rate 200 bpm. She became hypotensive placed on amiodarone drip. Bedside echocardiogram with ejection fraction 20-25% diffuse hypokinesis, RV functioning well. Placed on broad-spectrum antibiotic suspect viral illness. Troponin mildly elevated 0.22 days 0.29. Cardiology service is consulted and underwent cardiac catheterization showing severe distal LAD stenosis moderate ostial large OMI stenosis. Advised medical management. Post catheterization noted left-sided weakness. MRI of the brain showed small volume patchy cortical and subcortical ischemic infarcts within the left frontal and right frontoparietal region with additional tiny cortical infarcts within the right occipital lobe.  Subjective/Complaints: Patient seen in physical therapy. Ambulating with left lateral pulse in. She does not complain of any type of chest pain or shortness of breath with ambulation.   Objective: Vital Signs: Blood pressure 124/79, pulse 93, temperature 98.6 F (37 C), temperature source Oral, resp. rate 18, SpO2 95 %. No results found. Results for orders placed or performed during the hospital encounter of 08/29/15 (from the past 72 hour(s))  CBC WITH DIFFERENTIAL     Status: Abnormal   Collection Time: 08/30/15  5:51 AM  Result Value Ref Range   WBC 9.8 4.0 - 10.5 K/uL    Comment: REPEATED TO VERIFY   RBC 4.32 3.87 - 5.11 MIL/uL   Hemoglobin 10.2 (L) 12.0 - 15.0 g/dL    Comment: REPEATED TO VERIFY   HCT 31.3 (L) 36.0 - 46.0 %   MCV 72.5 (L) 78.0 - 100.0 fL   MCH 23.6 (L) 26.0 - 34.0 pg   MCHC 32.6  30.0 - 36.0 g/dL   RDW 16.1 (H) 11.5 - 15.5 %   Platelets 227 150 - 400 K/uL    Comment: REPEATED TO VERIFY   Neutrophils Relative % 71 %   Neutro Abs 7.0 1.7 - 7.7 K/uL   Lymphocytes Relative 19 %   Lymphs Abs 1.8 0.7 - 4.0 K/uL   Monocytes Relative 6 %   Monocytes Absolute 0.6 0.1 - 1.0 K/uL   Eosinophils Relative 3 %   Eosinophils Absolute 0.3 0.0 - 0.7 K/uL   Basophils Relative 0 %   Basophils Absolute 0.0 0.0 - 0.1 K/uL  Comprehensive metabolic panel     Status: Abnormal   Collection Time: 08/30/15  5:51 AM  Result Value Ref Range   Sodium 139 135 - 145 mmol/L   Potassium 3.8 3.5 - 5.1 mmol/L   Chloride 104 101 - 111 mmol/L   CO2 25 22 - 32 mmol/L   Glucose, Bld 113 (H) 65 - 99 mg/dL   BUN 12 6 - 20 mg/dL   Creatinine, Ser 1.04 (H) 0.44 - 1.00 mg/dL   Calcium 9.4 8.9 - 10.3 mg/dL   Total Protein 6.0 (L) 6.5 - 8.1 g/dL   Albumin 2.4 (L) 3.5 - 5.0 g/dL   AST 27 15 - 41 U/L   ALT 19 14 - 54 U/L   Alkaline Phosphatase 63 38 - 126 U/L   Total Bilirubin 0.6 0.3 - 1.2 mg/dL   GFR calc non Af Amer 50 (L) >60 mL/min   GFR calc Af Amer 58 (L) >60 mL/min    Comment: (NOTE) The eGFR has  been calculated using the CKD EPI equation. This calculation has not been validated in all clinical situations. eGFR's persistently <60 mL/min signify possible Chronic Kidney Disease.    Anion gap 10 5 - 15     HEENT: normal Cardio: RRR and nno murmurs Resp: CTA B/L and unlabored after  ambulation with walker 50 feet GI: BS positive and nontender nondistended Extremity:  Pulses positive and No Edema Skin:   Intact Neuro: Alert/Oriented, Abnormal Motor 4/5 in the left deltoid, biceps, triceps, grip and Other 5/5 in the right deltoid, biceps, triceps, grip, 5/5 bilateral hip flexor and extensor ankle dorsiflexor Musc/Skel:  Other no pain with upper extremity or lower extremity range of motion Gen. no acute distress Gait left lateral pulsion   Assessment/Plan: 1. 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 Physiatrist is providing close team supervision and 24 hour management of active medical problems listed below. Physiatrist and rehab team continue to assess barriers to discharge/monitor patient progress toward functional and medical goals. FIM:                   Function - Comprehension Comprehension: Auditory Comprehension assist level: Follows basic conversation/direction with extra time/assistive device  Function - Expression Expression: Verbal Expression assist level: Expresses basic needs/ideas: With extra time/assistive device  Function - Social Interaction Social Interaction assist level: Interacts appropriately with others - No medications needed.  Function - Problem Solving Problem solving assist level: Solves basic 90% of the time/requires cueing < 10% of the time  Function - Memory Memory assist level: Complete Independence: No helper Patient normally able to recall (first 3 days only): Current season, Location of own room, Staff names and faces, That he or she is in a hospital Medical Problem List and Plan: 1. 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  DVT Prophylaxis/Anticoagulation: SCDs. 3. Pain Management: Tylenol 4. CAD.Medical management with aspirin therapy 5. Neuropsych: This patient is capable of making decisions on her own behalf. 6. Skin/Wound Care: Routine skin checks 7. Fluids/Electrolytes/Nutrition: Routine eyewith follow-up chemistries 8. Hypertension.Bidil 20-30 7.5 mg 3 times a day, Coreg 12.5 mg twice a day,Entresto 24-26 milligrams twice a day. Monitor with increased mobility 9. Viral illness. Continue Zithromax 3 doses 10. Hyperlipidemia. Lipitor 11.  Cardiomyopathy will monitor her exercise tolerance  LOS (Days) 1 A FACE TO FACE EVALUATION WAS PERFORMED  KIRSTEINS,ANDREW E 08/30/2015, 8:23 AM

## 2015-08-30 NOTE — Progress Notes (Signed)
Physical Therapy Note  Patient Details  Name: Julie Stein MRN: [id number] Date of Birth: 07-16-1935 Today's Date: 08/30/2015    Pt missed 30 min skilled PT due to fatigue. Pt encouraged to participate and discussed benefits of physical activity as contributory to progress towards goals, however pt continues to decline at this time.   Carolynn Comment Tygielski 08/30/2015, 2:22 PM

## 2015-08-30 NOTE — Progress Notes (Signed)
Social Work  Social Work Assessment and Plan  Patient Details  Name: Julie Stein MRN: [id number] Date of Birth: 1935-06-18  Today's Date: 08/30/2015  Problem List:  Patient Active Problem List   Diagnosis Date Noted  . Acute bilat watershed infarction [facility]) 08/29/2015  . Weakness   . Encounter for central line placement   . Acute respiratory failure with hypercapnia (HCC) 08/26/2015  . Essential hypertension 08/26/2015  . Acute systolic CHF (congestive heart failure), NYHA class 4 (HCC) 08/26/2015  . Pulmonary edema 08/26/2015  . Paroxysmal SVT (supraventricular tachycardia) (HCC) 08/26/2015  . Respiratory failure with hypoxia (HCC) 08/26/2015  . Acute heart failure (HCC)   . Acute respiratory failure with hypoxia (HCC)   . SVT (supraventricular tachycardia) (HCC)    Past Medical History:  Past Medical History  Diagnosis Date  . Hypertension    Past Surgical History:  Past Surgical History  Procedure Laterality Date  . Cardiac catheterization N/A 08/27/2015    Procedure: Right/Left Heart Cath and Coronary Angiography;  Surgeon: Laurey Morale, MD;  Location: [facility] INVASIVE CV LAB;  Service: Cardiovascular;  Laterality: N/A;   Social History:  reports that she has never smoked. She does not have any smokeless tobacco history on file. She reports that she does not drink alcohol or use illicit drugs.  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 Pt has extended family who are supportive and will do whatever she needs done for her. She is one who has always been independent and this is difficult for her to depend upon others.  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 Family members are always here with her.   Abuse/Neglect Physical Abuse: Denies Verbal Abuse: Denies Sexual Abuse: Denies Exploitation of patient/patient's resources: Denies Self-Neglect: Denies  Emotional Status Pt's affect, behavior adn adjustment status: Pt is motivated to improve and regain her independence, she was doing everything for herself and still driving. She is surprised how tired she is after therapies and has to take rest breaks. Her children are very supportive and cheer her on. Recent Psychosocial Issues: Other health sisues that have come to light sicne hospitalizatin-cardiac issues Pyschiatric History: No issues deferred depression screen at this time due to doing well and pt tired from therapies. Will monitor and have neuro-psych see if needed. Substance Abuse History: No issues  Patient / Family Perceptions, Expectations & Goals Pt/Family understanding of illness & functional limitations: Pt and daughter's have a good understanding of her stroke and deficits. All are encouraged by the progress she is making and hoping this will continue while on rehab.  Both speak with the MD and feel thier questions are being answered and addressed. Premorbid pt/family roles/activities: Mother, grandmother, retiree, church member, etc Anticipated changes in roles/activities/participation: plans to resume Pt/family expectations/goals: Pt states:  " I want to get back to what  I was doing before, no cane or walker when I leave here."  Daughter states: " We hope she will do well but will do what we can for her. She doesn;t want [place] to help her."  [employer]: None Premorbid Home Care/DME Agencies: None Transportation available at discharge: family members Resource referrals recommended: Support group (specify)  Discharge Planning Living Arrangements: Children, Other relatives Support Systems: Children, Other relatives, Friends/neighbors, Psychologist, clinical community Type of Residence: Private residence Insurance Resources: Electrical engineer Resources: Restaurant manager, fast food Screen Referred: Yes Living Expenses: Lives with family Money Management: Patient, Family Does the patient have any problems obtaining your medications?: No (wasn't on any medicines did not go to a doctor prior to admission) Home Management: Family members, pt did prior to admission Patient/Family Preliminary Plans: Return home with grandson and his wife, who go to school in the mornings and grandson;s wife home in the afternoon. Between all of the family members they cna provide 24 hr care if needed. Pt is doing well and should do well here. Pt has high goals for herself. Social Work Anticipated Follow Up Needs: HH/OP, Support Group  Clinical Impression Very pleasant female who has kept herself in great shape and very active.  She has high goals for herself and wants to regain her independence while here. Her family is very involved and supportive and will assist her At home if needed. Will have her apply for Medicaid so would cover her deductibles and co-pays for Medicare. Gave daughter application and will assist with this. Will await team's evaluations and work toward discharge.  Lucy Chris 08/30/2015, 1:55 PM

## 2015-08-30 NOTE — Progress Notes (Signed)
Occupational Therapy Session Note  Patient Details  Name: Julie Stein MRN: [id number] Date of Birth: 1935/04/18  Today's Date: 08/30/2015 OT Individual Time: 1330-1340 OT Individual Time Calculation (min): 10 min  and Today's Date: 08/30/2015 OT Missed Time: 20 Minutes Missed Time Reason: Patient fatigue   Short Term Goals: Week 1:  OT Short Term Goal 1 (Week 1): Pt will complete bathing with min assist at sit > stand level in shower OT Short Term Goal 2 (Week 1): Pt will complete LB dressing with min assist at sit > stand level OT Short Term Goal 3 (Week 1): Pt will complete 2 grooming tasks in standing with supervision OT Short Term Goal 4 (Week 1): Pt will complete toilet transfers with min assist with LRAD  Skilled Therapeutic Interventions/Progress Updates:  Treatment session with focus on completing cognitive assessment BIMS and discussing OT purpose, POC, and goals. Pt reports feeling fatigued from AM sessions and declined OOB activity.  Encouraged participation in session and discussed benefits of therapy towards meeting goals, however pt continued to decline.  Returned at 1500 to reattempt, with pt continuing to decline participation at this time.    Therapy Documentation Precautions:  Precautions Precautions: Fall Restrictions Weight Bearing Restrictions: No General: General OT Amount of Missed Time: 20 Minutes PT Missed Treatment Reason: Patient fatigue Vital Signs: Therapy Vitals Temp: 98.7 F (37.1 C) Temp Source: Oral Pulse Rate: 95 Resp: 18 BP: 95/61 mmHg Patient Position (if appropriate): Lying Oxygen Therapy SpO2: 100 % O2 Device: Not Delivered Pain: Pain Assessment Pain Assessment: No/denies pain  See Function Navigator for Current Functional Status.   Therapy/Group: Individual Therapy  Rosalio Loud 08/30/2015, 3:02 PM

## 2015-08-30 NOTE — Plan of Care (Signed)
Problem: RH BOWEL ELIMINATION Goal: RH STG MANAGE BOWEL WITH ASSISTANCE STG Manage Bowel with Mod I Assistance.  Outcome: Progressing Pt said she is just now getting her appetite back and said a BM will come when she eats enough. + bowel sounds and passing gas.

## 2015-08-30 NOTE — Progress Notes (Signed)
Advanced Heart Failure Rounding Note  PCP: @PCPNAME @ Primary Cardiologist:   Subjective:    Feels good today and is glad to be moving in CIR.   SBPs 120s on current meds.  Dropped down to SPB 95 this pm, but denies lightheadedness or dizziness.   Objective:   Weight Range:   There is no weight on file to calculate BMI.   Vital Signs:   Temp:  [97.7 F (36.5 C)-98.7 F (37.1 C)] 98.7 F (37.1 C) (10/13 1417) Pulse Rate:  [90-95] 95 (10/13 1417) Resp:  [18-22] 18 (10/13 1417) BP: (95-159)/(61-91) 95/61 mmHg (10/13 1417) SpO2:  [93 %-100 %] 100 % (10/13 1417) Last BM Date: 08/25/15  Weight change: There were no vitals filed for this visit.  Intake/Output:   Intake/Output Summary (Last 24 hours) at 08/30/15 1444 Last data filed at 08/30/15 1300  Gross per 24 hour  Intake    120 ml  Output      0 ml  Net    120 ml     Physical Exam: General: NAD Neck: JVP flat, no thyromegaly or thyroid nodule.  Lungs: CTA, normal effort CV: Nondisplaced PMI. Heart regular S1/S2, +S3, no murmur. No peripheral edema.  Abdomen: Soft, NT, ND, No HSM. Neurologic: Alert and oriented x 3, moves all 4 extremities without difficulty Psych: Normal affect. Extremities: No clubbing or cyanosis.  Labs: CBC  Recent Labs  08/28/15 1339 08/29/15 0442 08/30/15 0551  WBC 11.6* 12.2* 9.8  NEUTROABS 9.9*  --  7.0  HGB 9.1* 9.5* 10.2*  HCT 27.8* 29.4* 31.3*  MCV 71.5* 72.6* 72.5*  PLT 197 225 227   Basic Metabolic Panel  Recent Labs  08/28/15 0530  08/29/15 0442 08/30/15 0551  NA 136  < > 139 139  K 3.6  < > 3.7 3.8  CL 102  < > 103 104  CO2 27  < > 26 25  GLUCOSE 111*  < > 128* 113*  BUN 20  < > 14 12  CREATININE 1.15*  < > 1.12* 1.04*  CALCIUM 8.6*  < > 9.2 9.4  MG 1.8  --   --   --   PHOS 2.6  --   --   --   < > = values in this interval not displayed. Liver Function Tests  Recent Labs  08/30/15 0551  AST 27  ALT 19  ALKPHOS 63  BILITOT 0.6  PROT 6.0*   ALBUMIN 2.4*   BNP: BNP (last 3 results)  Recent Labs  08/26/15 1307  BNP 451.8*     Latest Echo  Latest Cath   Medications:     Scheduled Medications: . aspirin EC  325 mg Oral Daily  . atorvastatin  80 mg Oral q1800  . azithromycin  250 mg Oral Q24H  . carvedilol  12.5 mg Oral BID WC  . chlorhexidine  15 mL Mouth Rinse BID  . Influenza vac split quadrivalent PF  0.5 mL Intramuscular Tomorrow-1000  . isosorbide-hydrALAZINE  1 tablet Oral TID  . pneumococcal 23 valent vaccine  0.5 mL Intramuscular Tomorrow-1000  . sacubitril-valsartan  1 tablet Oral BID    Infusions:    PRN Medications: acetaminophen, ondansetron **OR** ondansetron (ZOFRAN) IV, sorbitol   Assessment/Plan   80 yo with history of HTN on no meds prior to admission was admitted with acute dyspnea and mild chest pain. Noted to have acute respiratory failure, initially requiring Bipap. CXR with pulmonary edema and echo with EF 20-25%, diffuse hypokinesis. Now  in CIR.  1. Acute systolic CHF: No prior echo. Echo 08/26/15 EF 20-25%, mild LVH, diffuse hypokinesis. - Cause uncertain => viral myocarditis versus hypertensive cardiomyopathy.  - No Lasix. Continue Bidil, Coreg, and Entresto at current doses. 2. HTN:  - BP now controlled.Soft this pm, will not up-titrate. - Continue current meds.  - Awaiting renal artery dopplers to look for renal artery stenosis.  3. Possible TIA with left foot weakness.  - Resolved. CT head w/o CVA or bleed. MRI head shows small infarcts in watershed region. On ASA - Needs carotid dopplers   4. CAD - Continue medical management of CAD with ASA and statin. 5. ID:  - No definite evidence for infection. CCM recommend 5 days of azithromycin, home on azithro 250 mg daily to complete 5 days.  6. SVT:  - No further SVT, now off amiodarone.  7. Anemia: Hemoglobin improved, stable. 8. CIR   Has HF f/u 09/12/15.   Length of Stay: 1   Graciella Freer  PA-C 08/30/2015, 2:44 PM  Advanced Heart Failure Team Pager [phone] (M-F; 7a - 4p)  Please contact [facility] for night-coverage after hours (4p -7a ) and weekends on [email or web address]  Patient seen with PA, agree with the above note.  Stable today, feels weak though.  Will need to keep an eye on BP, may need to back off on meds if it gets too low.    Marca Ancona 08/30/2015 5:24 PM

## 2015-08-30 NOTE — Progress Notes (Signed)
Ankit Karis Juba, MD Physician Signed Physical Medicine and Rehabilitation Consult Note 08/29/2015 12:48 PM  Related encounter: ED to Hosp-Admission (Discharged) from 08/26/2015 in [facility]    Expand All Collapse All        Physical Medicine and Rehabilitation Consult Reason for Consult: Watershed pattern infarct Referring Physician: [facility]   HPI: Julie Stein is a 80 y.o. right handed female history of hypertension. Patient lives with grandson and his wife large extended supportive family. Independent prior to admission and active. One level home 6 steps to entry. Presented 08/26/2015 with sudden onset of shortness of breath noted recent bouts of diarrhea, congestion and cough. Chest x-ray shows significant pulmonary edema. EKG with frequent PVCs bigeminy 2 runs of SVT heart rate 200 bpm. She became hypotensive placed on amiodarone drip. Bedside echocardiogram with ejection fraction 20-25% diffuse hypokinesis, RV functioning well. Placed on broad-spectrum antibiotic suspect viral illness. Troponin mildly elevated 0.22 days 0.29. Cardiology service is consulted and underwent cardiac catheterization showing severe distal LAD stenosis moderate ostial large OMI stenosis. Advised medical management. Post catheterization noted left-sided weakness. MRI of the brain showed small volume patchy cortical and subcortical ischemic infarcts within the left frontal and right frontoparietal region with additional tiny cortical infarcts within the right occipital lobe. MRA of the head with no major vessel occlusion or stenosis. Placed on aspirin for CVA prophylaxis. Maintained on contact droplet precautions. Physical therapy evaluation completed 08/29/2015 with recommendations of physical medicine rehabilitation consult.   Review of Systems  Constitutional: Negative for fever and chills.  HENT: Negative for hearing loss.  Eyes: Positive for blurred vision. Negative for  double vision.  Respiratory: Positive for shortness of breath.  Cardiovascular: Negative for chest pain and palpitations.  Gastrointestinal: Negative for vomiting.  Genitourinary: Negative for dysuria and hematuria.  Skin: Negative for rash.  Neurological: Negative for seizures, loss of consciousness and headaches.  All other systems reviewed and are negative.  Past Medical History  Diagnosis Date  . Hypertension    Past Surgical History  Procedure Laterality Date  . Cardiac catheterization N/A 08/27/2015    Procedure: Right/Left Heart Cath and Coronary Angiography; Surgeon: Laurey Morale, MD; Location: [facility] INVASIVE CV LAB; Service: Cardiovascular; Laterality: N/A;   History reviewed. No pertinent family history. Social History:  reports that she has never smoked. She does not have any smokeless tobacco history on file. She reports that she does not drink alcohol or use illicit drugs. Allergies:  Allergies  Allergen Reactions  . Demerol [Meperidine]     intolerance   Medications Prior to Admission  Medication Sig Dispense Refill  . COLLAGEN PO Take 1 capsule by mouth daily.    . Multiple Vitamins-Minerals (MULTIVITAMIN) tablet Take 1 tablet by mouth daily.      Home: Home Living Family/patient expects to be discharged to:: Private residence Living Arrangements: Other relatives (lives with grandson and his wife, has a large family) Available Help at Discharge: Family, Available 24 hours/day Type of Home: House Home Access: Stairs to enter [employer] of Steps: 6 Entrance Stairs-Rails: Right, Left, Can reach both Home Layout: One level Bathroom Shower/Tub: Health visitor: Standard Bathroom Accessibility: Yes Home Equipment: None Additional Comments: Pt was gardening, doing yard work, cleaned house - was fully independent PTA.   Functional History: Prior Function Level of Independence:  Independent Functional Status:  Mobility: Bed Mobility General bed mobility comments: Pt in chair on arrival.  Transfers Overall transfer level: Needs assistance  Equipment used: Rolling walker (2 wheeled) Transfers: Sit to/from Stand Sit to Stand: Mod assist, +2 physical assistance General transfer comment: Pt able to perform sit to stand with mod assist and cues as pt leans to left and has difficulty with coordination of movement on left hemibody.  Ambulation/Gait Ambulation/Gait assistance: Mod assist, +2 physical assistance Ambulation Distance (Feet): 18 Feet Assistive device: Rolling walker (2 wheeled), 2 person hand held assist Gait Pattern/deviations: Step-to pattern, Decreased stride length, Decreased step length - left, Decreased stance time - left, Decreased stance time - right, Decreased weight shift to right, Decreased weight shift to left, Ataxic, Staggering left, Staggering right, Trunk flexed, Narrow base of support General Gait Details: Pt with very poor postural stability overall. Pt unable to maintain upright trunk with left lateral lean throughout. Pt needed cues and assist to move left LE as pt could not coordinate the LE. Also needed assist to weight shift to right to unweight left hemibody. Question whether pt had decr awareness of right hemibody as well with questionable visual changes in right visual field. With ambulation, PT giving heavy assist at times to keep pt upright. Initially pt was ambulating without device first 6 steps and then used RW. Even with RW, pt still required extensive assist. Pt's family in room and PT had family assist pt with ambulation so they were aware of the assist given as intially they were convinced pt could go home, however once they tried to assist pt, they were agreeable that pt needs further therapy prior to d/c home.  Gait velocity interpretation: Below normal speed for age/gender    ADL:    Cognition: Cognition Overall  Cognitive Status: Impaired/Different from baseline Orientation Level: Oriented X4 Cognition Arousal/Alertness: Awake/alert Behavior During Therapy: Flat affect Overall Cognitive Status: Impaired/Different from baseline Area of Impairment: Following commands, Safety/judgement, Awareness, Problem solving Following Commands: Follows one step commands inconsistently Safety/Judgement: Decreased awareness of safety, Decreased awareness of deficits Awareness: Intellectual Problem Solving: Difficulty sequencing, Requires verbal cues, Requires tactile cues General Comments: Pt had difficulty following commands with delayed processing of information at times. Pt had trouble following commands for eye movements.   Blood pressure 140/94, pulse 113, temperature 97.7 F (36.5 C), temperature source Oral, resp. rate 15, height  (1.499 m), weight 49.6 kg (109 lb 5.6 oz), SpO2 94 %. Physical Exam  Vitals reviewed. Constitutional: She is oriented to person, place, and time. She appears well-developed and well-nourished.  HENT:  Head: Normocephalic and atraumatic.  Eyes: Conjunctivae and EOM are normal.  Negative nystagmus  Neck: Normal range of motion. Neck supple. No thyromegaly present.  Cardiovascular: Normal rate and regular rhythm.  Respiratory: Effort normal and breath sounds normal. No respiratory distress.  GI: Soft. Bowel sounds are normal. She exhibits no distension. There is no tenderness.  Musculoskeletal: She exhibits no edema.  PROM WNL B/l UE 4+/5 grossly RLE 4+/5 grossly LLE hip flex 4/5, ankle dorsi/plantar flexion 4+/5  Neurological: She is alert and oriented to person, place, and time. She has normal reflexes.  Makes good eye contact with examiner and follows full commands. Fair awareness of deficits Left trunk lean Sensation to light touch grossly intact throughout  Skin: Skin is warm and dry.  Psychiatric: She has a normal mood and affect. Her behavior is normal.      Lab Results Last 24 Hours    Results for orders placed or performed during the hospital encounter of 08/26/15 (from the past 24 hour(s))  Glucose, capillary Status: Abnormal  Collection Time: 08/28/15 4:30 PM  Result Value Ref Range   Glucose-Capillary 138 (H) 65 - 99 mg/dL   Comment 1 Capillary Specimen   Glucose, capillary Status: Abnormal   Collection Time: 08/28/15 8:39 PM  Result Value Ref Range   Glucose-Capillary 130 (H) 65 - 99 mg/dL   Comment 1 Capillary Specimen   Glucose, capillary Status: None   Collection Time: 08/28/15 11:42 PM  Result Value Ref Range   Glucose-Capillary 95 65 - 99 mg/dL   Comment 1 Capillary Specimen   Glucose, capillary Status: Abnormal   Collection Time: 08/29/15 4:28 AM  Result Value Ref Range   Glucose-Capillary 123 (H) 65 - 99 mg/dL   Comment 1 Venous Specimen   CBC Status: Abnormal   Collection Time: 08/29/15 4:42 AM  Result Value Ref Range   WBC 12.2 (H) 4.0 - 10.5 K/uL   RBC 4.05 3.87 - 5.11 MIL/uL   Hemoglobin 9.5 (L) 12.0 - 15.0 g/dL   HCT 16.1 (L) 09.6 - 04.5 %   MCV 72.6 (L) 78.0 - 100.0 fL   MCH 23.5 (L) 26.0 - 34.0 pg   MCHC 32.3 30.0 - 36.0 g/dL   RDW 40.9 (H) 81.1 - 91.4 %   Platelets 225 150 - 400 K/uL  Basic metabolic panel Status: Abnormal   Collection Time: 08/29/15 4:42 AM  Result Value Ref Range   Sodium 139 135 - 145 mmol/L   Potassium 3.7 3.5 - 5.1 mmol/L   Chloride 103 101 - 111 mmol/L   CO2 26 22 - 32 mmol/L   Glucose, Bld 128 (H) 65 - 99 mg/dL   BUN 14 6 - 20 mg/dL   Creatinine, Ser 7.82 (H) 0.44 - 1.00 mg/dL   Calcium 9.2 8.9 - 95.6 mg/dL   GFR calc non Af Amer 46 (L) >60 mL/min   GFR calc Af Amer 53 (L) >60 mL/min   Anion gap 10 5 - 15  Glucose, capillary Status: None   Collection Time: 08/29/15 7:37 AM  Result  Value Ref Range   Glucose-Capillary 75 65 - 99 mg/dL   Comment 1 Capillary Specimen   Glucose, capillary Status: Abnormal   Collection Time: 08/29/15 11:12 AM  Result Value Ref Range   Glucose-Capillary 112 (H) 65 - 99 mg/dL   Comment 1 Capillary Specimen       Imaging Results (Last 48 hours)    [name] Wo Contrast  08/28/2015 CLINICAL DATA: Initial evaluation for transient left-sided weakness following an episode of unresponsiveness and hypotension. EXAM: MRI HEAD WITHOUT CONTRAST MRA HEAD WITHOUT CONTRAST TECHNIQUE: Multiplanar, multiecho pulse sequences of the brain and surrounding structures were obtained without intravenous contrast. Angiographic images of the head were obtained using MRA technique without contrast. COMPARISON: Prior CT from 08/27/2015. FINDINGS: MRI HEAD FINDINGS Diffuse prominence of the CSF containing spaces is compatible with generalized age-related cerebral atrophy. Patchy and confluent T2/FLAIR hyperintensity within the periventricular and deep white matter both cerebral hemispheres most consistent with chronic small vessel ischemic disease. Small remote lacunar infarct within the periventricular white matter of the left corona radiata. There is a few small foci of patchy restricted diffusion within the high left frontal lobe and right frontoparietal regions bilaterally (series 4, image 33, 35). Additional tiny punctate cortical infarct more inferiorly within the right occipital lobe (series 4, image 26). Associated signal loss seen on corresponding ADC map. No associated hemorrhage or mass effect. Bodies consistent with small acute ischemic infarcts. Given the distribution of these findings,  underlying watershed etiology is favored. Normal intravascular flow voids are maintained. Additional scattered subcentimeter foci of susceptibility artifact seen in within the peripheral cortices of the bilateral cerebral hemispheres on gradient echo  sequence, consistent with small chronic micro hemorrhages. While these could potentially be related underlying hypertension, possible amyloid angiopathy could be considered given their somewhat peripheral distribution. No mass lesion, midline shift, or mass effect. No hydrocephalus. No extra-axial fluid collection. Craniocervical junction within normal limits. Mild degenerative spondylolysis noted within the visualized upper cervical spine. Pituitary gland normal. Thinning of the anterior body of the corpus callosum noted, which may related to remote ischemia. No acute abnormality about the orbits. Mild right-sided exophthalmos. Paranasal sinuses are clear. No mastoid effusion. Inner ear structures within normal limits. Bone marrow signal intensity within normal limits. No scalp soft tissue abnormality. MRA HEAD FINDINGS ANTERIOR CIRCULATION: Visualized distal cervical segments of the internal carotid arteries are patent with antegrade flow. Petrous segments widely patent. Scattered multi focal atheromatous irregularity within the cavernous and supraclinoid left ICA. There is a short-segment moderate stenosis at the proximal supraclinoid left ICA (series 5, image 72). Cavernous and supraclinoid right ICA widely patent. Right A1 segment widely patent. Left A1 segment slightly hypoplastic. Anterior communicating artery patent. Atheromatous irregularity within the anterior cerebral arteries bilaterally. M1 segments widely patent without stenosis or occlusion. Atheromatous irregularity within the left M1 segment. MCA bifurcations within normal limits. MCA branches symmetric bilaterally. Distal small vessel disease present within the MCA branches bilaterally. POSTERIOR CIRCULATION: Vertebral arteries patent to the vertebrobasilar junction. Right vertebral artery slightly diminutive. Posterior inferior cerebral arteries not well evaluated on this exam. Basilar artery widely patent. Small left anterior inferior cerebral  artery noted. Superior cerebellar arteries patent. Both sub posterior cerebral arteries arise from the basilar artery and are well opacified to their distal aspects. Distal small vessel disease within the PCA branches bilaterally. There is a more focal short-segment moderate stenosis within the proximal right P2 segment (series 505, image 15). No aneurysm or vascular malformation. IMPRESSION: MRI HEAD IMPRESSION: 1. Small volume patchy cortical and subcortical ischemic infarcts within the left frontal and right frontoparietal region, with additional tiny cortical infarct within the right occipital lobe. Given the distribution of these infarcts, an underlying watershed etiology is favored. No associated hemorrhage or mass effect. 2. Scattered small chronic micro hemorrhages as above. While these might be related to chronic underlying hypertension, possible amyloid angiopathy could also be considered given their somewhat peripheral distribution. 3. Atrophy with chronic microvascular ischemic disease. MRA HEAD IMPRESSION: 1. No large vessel or proximal arterial branch occlusion identified. 2. Short-segment moderate stenosis within the supraclinoid left ICA. 3. Short-segment moderate stenosis within the proximal right P2 segment. 4. Distal small vessel atheromatous disease within the MCA, ACA, and PCA branches bilaterally Electronically Signed By: Rise Mu M.D. On: 08/28/2015 02:26   Mr Brain Wo Contrast  08/28/2015 CLINICAL DATA: Initial evaluation for transient left-sided weakness following an episode of unresponsiveness and hypotension. EXAM: MRI HEAD WITHOUT CONTRAST MRA HEAD WITHOUT CONTRAST TECHNIQUE: Multiplanar, multiecho pulse sequences of the brain and surrounding structures were obtained without intravenous contrast. Angiographic images of the head were obtained using MRA technique without contrast. COMPARISON: Prior CT from 08/27/2015. FINDINGS: MRI HEAD FINDINGS Diffuse prominence of  the CSF containing spaces is compatible with generalized age-related cerebral atrophy. Patchy and confluent T2/FLAIR hyperintensity within the periventricular and deep white matter both cerebral hemispheres most consistent with chronic small vessel ischemic disease. Small remote lacunar infarct within the periventricular white  matter of the left corona radiata. There is a few small foci of patchy restricted diffusion within the high left frontal lobe and right frontoparietal regions bilaterally (series 4, image 33, 35). Additional tiny punctate cortical infarct more inferiorly within the right occipital lobe (series 4, image 26). Associated signal loss seen on corresponding ADC map. No associated hemorrhage or mass effect. Bodies consistent with small acute ischemic infarcts. Given the distribution of these findings, underlying watershed etiology is favored. Normal intravascular flow voids are maintained. Additional scattered subcentimeter foci of susceptibility artifact seen in within the peripheral cortices of the bilateral cerebral hemispheres on gradient echo sequence, consistent with small chronic micro hemorrhages. While these could potentially be related underlying hypertension, possible amyloid angiopathy could be considered given their somewhat peripheral distribution. No mass lesion, midline shift, or mass effect. No hydrocephalus. No extra-axial fluid collection. Craniocervical junction within normal limits. Mild degenerative spondylolysis noted within the visualized upper cervical spine. Pituitary gland normal. Thinning of the anterior body of the corpus callosum noted, which may related to remote ischemia. No acute abnormality about the orbits. Mild right-sided exophthalmos. Paranasal sinuses are clear. No mastoid effusion. Inner ear structures within normal limits. Bone marrow signal intensity within normal limits. No scalp soft tissue abnormality. MRA HEAD FINDINGS ANTERIOR CIRCULATION: Visualized  distal cervical segments of the internal carotid arteries are patent with antegrade flow. Petrous segments widely patent. Scattered multi focal atheromatous irregularity within the cavernous and supraclinoid left ICA. There is a short-segment moderate stenosis at the proximal supraclinoid left ICA (series 5, image 72). Cavernous and supraclinoid right ICA widely patent. Right A1 segment widely patent. Left A1 segment slightly hypoplastic. Anterior communicating artery patent. Atheromatous irregularity within the anterior cerebral arteries bilaterally. M1 segments widely patent without stenosis or occlusion. Atheromatous irregularity within the left M1 segment. MCA bifurcations within normal limits. MCA branches symmetric bilaterally. Distal small vessel disease present within the MCA branches bilaterally. POSTERIOR CIRCULATION: Vertebral arteries patent to the vertebrobasilar junction. Right vertebral artery slightly diminutive. Posterior inferior cerebral arteries not well evaluated on this exam. Basilar artery widely patent. Small left anterior inferior cerebral artery noted. Superior cerebellar arteries patent. Both sub posterior cerebral arteries arise from the basilar artery and are well opacified to their distal aspects. Distal small vessel disease within the PCA branches bilaterally. There is a more focal short-segment moderate stenosis within the proximal right P2 segment (series 505, image 15). No aneurysm or vascular malformation. IMPRESSION: MRI HEAD IMPRESSION: 1. Small volume patchy cortical and subcortical ischemic infarcts within the left frontal and right frontoparietal region, with additional tiny cortical infarct within the right occipital lobe. Given the distribution of these infarcts, an underlying watershed etiology is favored. No associated hemorrhage or mass effect. 2. Scattered small chronic micro hemorrhages as above. While these might be related to chronic underlying hypertension, possible  amyloid angiopathy could also be considered given their somewhat peripheral distribution. 3. Atrophy with chronic microvascular ischemic disease. MRA HEAD IMPRESSION: 1. No large vessel or proximal arterial branch occlusion identified. 2. Short-segment moderate stenosis within the supraclinoid left ICA. 3. Short-segment moderate stenosis within the proximal right P2 segment. 4. Distal small vessel atheromatous disease within the MCA, ACA, and PCA branches bilaterally Electronically Signed By: Rise Mu M.D. On: 08/28/2015 02:26   Dg Chest Port 1 View  08/28/2015 CLINICAL DATA: Shortness of breath. Pulmonary edema. EXAM: PORTABLE CHEST 1 VIEW COMPARISON: 08/26/2015. FINDINGS: Left IJ line in stable position. Cardiomegaly with bilateral pulmonary alveolar infiltrates. Slight improvement. Persistent small  pleural effusions. No pneumothorax. IMPRESSION: 1. Left IJ line in stable position. 2. Cardiomegaly with persistent bilateral pulmonary infiltrates, partial clearing from prior exam. Persistent small pleural effusions. Electronically Signed By: Maisie Fus Register On: 08/28/2015 07:18     Assessment/Plan: Pt meets criteria for admission to inpatient rehab today. Pt currently Mod Assist +2 with PT for most tasks. Would anticipate pt would reach supervision level within 7-10 days. Will admit today.    Maryla Morrow, MD 08/29/2015       Revision History     Date/Time User Provider Type Action   08/29/2015 3:00 PM Ankit Karis Juba, MD Physician Sign   08/29/2015 1:14 PM Charlton Amor, PA-C Physician Assistant Pend   View Details Report       Routing History     Date/Time From To Method   08/29/2015 3:00 PM Ankit Karis Juba, MD Ankit Karis Juba, MD In [facility]   08/29/2015 3:00 PM Ankit Karis Juba, MD No Pcp Per Patient In Basket

## 2015-08-30 NOTE — Evaluation (Signed)
Occupational Therapy Assessment and Plan  Patient Details  Name: Julie Stein MRN: [id number] Date of Birth: 09-12-1935  OT Diagnosis: abnormal posture, hemiplegia affecting non-dominant side and muscle weakness (generalized) Rehab Potential: Rehab Potential (ACUTE ONLY): Good ELOS: 10-12 days   Today's Date: 08/30/2015 OT Individual Time: 5852-7782 OT Individual Time Calculation (min): 60 min     Problem List:  Patient Active Problem List   Diagnosis Date Noted  . Acute bilat watershed infarction [facility]) 08/29/2015  . Weakness   . Encounter for central line placement   . Acute respiratory failure with hypercapnia ([place]) 08/26/2015  . Essential hypertension 08/26/2015  . Acute systolic CHF (congestive heart failure), NYHA class 4 ([place]) 08/26/2015  . Pulmonary edema 08/26/2015  . Paroxysmal SVT (supraventricular tachycardia) ([place]) 08/26/2015  . Respiratory failure with hypoxia ([place]) 08/26/2015  . Acute heart failure ([place])   . Acute respiratory failure with hypoxia ([place])   . SVT (supraventricular tachycardia) ([facility])     Past Medical History:  Past Medical History  Diagnosis Date  . Hypertension    Past Surgical History:  Past Surgical History  Procedure Laterality Date  . Cardiac catheterization N/A 08/27/2015    Procedure: Right/Left Heart Cath and Coronary Angiography;  Surgeon: Larey Dresser, MD;  Location: [place] [facility];  Service: Cardiovascular;  Laterality: N/A;    Assessment & Plan Clinical Impression: Patient is a 81 y.o. right handed female history of hypertension. Patient lives with grandson and his wife large extended supportive family. Independent prior to admission and active. One level home 6 steps to entry. Presented 08/26/2015 with sudden onset of shortness of breath noted recent bouts of diarrhea, congestion and cough. Chest x-ray shows significant pulmonary edema. EKG with frequent PVCs bigeminy 2 runs of SVT heart rate 200 bpm. She became hypotensive  placed on amiodarone drip. Bedside echocardiogram with ejection fraction 20-25% diffuse hypokinesis, RV functioning well. Placed on broad-spectrum antibiotic suspect viral illness. Troponin mildly elevated 0.22 days 0.29. Cardiology service is consulted and underwent cardiac catheterization showing severe distal LAD stenosis moderate ostial large OMI stenosis. Advised medical management. Post catheterization noted left-sided weakness. MRI of the brain showed small volume patchy cortical and subcortical ischemic infarcts within the left frontal and right frontoparietal region with additional tiny cortical infarcts within the right occipital lobe. MRA of the head with no major vessel occlusion or stenosis. Placed on aspirin for CVA prophylaxis. Maintained on contact droplet precautions. Physical therapy evaluation completed 08/29/2015 with recommendations of physical medicine rehabilitation consult.   Patient transferred to CIR on 08/29/2015 .    Patient currently requires mod with basic self-care skills secondary to muscle weakness, abnormal tone, unbalanced muscle activation, motor apraxia and decreased coordination, and decreased standing balance, decreased postural control and decreased balance strategies.  Prior to hospitalization, patient could complete ADLs and IADLs with independent .  Patient will benefit from skilled intervention to decrease level of assist with basic self-care skills, increase independence with basic self-care skills and increase level of independence with iADL prior to discharge home with care partner.  Anticipate patient will require intermittent supervision and follow up home health.  OT - End of Session Activity Tolerance: Tolerates 30+ min activity with multiple rests Endurance Deficit: Yes Endurance Deficit Description: requires rest breaks following mobility tasks OT Assessment Rehab Potential (ACUTE ONLY): Good OT Patient demonstrates impairments in the following  area(s): Balance;Endurance;Motor;Perception;Safety OT Basic ADL's Functional Problem(s): Grooming;Bathing;Dressing;Toileting OT Advanced ADL's Functional Problem(s): Simple Meal Preparation;Laundry OT Transfers Functional Problem(s): Toilet;Tub/Shower OT  Additional Impairment(s): None OT Plan OT Intensity: Minimum of 1-2 x/day, 45 to 90 minutes OT Frequency: 5 out of 7 days OT Duration/Estimated Length of Stay: 10-12 days OT Treatment/Interventions: Balance/vestibular training;Community reintegration;Discharge planning;Disease mangement/prevention;DME/adaptive equipment instruction;Functional mobility training;Neuromuscular re-education;Pain management;Patient/family education;Psychosocial support;Self Care/advanced ADL retraining;Therapeutic Activities;Therapeutic Exercise;UE/LE Strength taining/ROM;UE/LE Coordination activities;Visual/perceptual remediation/compensation OT Self Feeding Anticipated Outcome(s): No goal OT Basic Self-Care Anticipated Outcome(s): Supervision OT Toileting Anticipated Outcome(s): Supervision OT Bathroom Transfers Anticipated Outcome(s): Supervision OT Recommendation Recommendations for Other Services: Neuropsych consult Patient destination: Home Follow Up Recommendations: Home health OT Equipment Recommended: Tub/shower seat   Skilled Therapeutic Intervention OT eval completed with discussion of rehab process, OT purpose, POC, goals,and ELOS.  Pt reports already completing bathing and dressing prior to session and reports someone assisted her.  Completed grooming tasks in standing with steady assist in standing due to Lt lean and increase in Lt weight shift as she fatigues.  Ambulated to toilet without AD with pt requiring mod assist for stability,  HHA on Lt to improve posture, and verbal cues for step length as pt tends to shuffle and keep feet close together.  Utilized RW with improved upright/midline posture with decreased Lt lean and veering to Lt.  Engaged in  standing activity with focus on trunk rotation and weight shifting to improve control and balance, pt required steady assist throughout standing due to tendency to weight shift Lt. Pt tearful during session, provided emotional support and encouragement.  OT Evaluation Precautions/Restrictions  Precautions Precautions: Fall Restrictions Weight Bearing Restrictions: No General   Vital Signs   Pain Pain Assessment Pain Assessment: No/denies pain Home Living/Prior Functioning Home Living Family/patient expects to be discharged to:: Private residence Living Arrangements: Children, Other relatives Available Help at Discharge: Family, Available 24 hours/day (grandson and his wife in school during day, but other family members able to assist ) Type of Home: House Home Access: Stairs to enter Entrance Stairs-Number of Steps: 6 Entrance Stairs-Rails: Right, Left, Can reach both Home Layout: One level Bathroom Shower/Tub: Walk-in shower, Curtain Bathroom Toilet: Standard Bathroom Accessibility: Yes Additional Comments: Pt was gardening, doing yard work, cleaned house - was fully independent PTA.   Lives With: Other (Comment) (grandson and his wife) IADL History Homemaking Responsibilities: Yes Meal Prep Responsibility: Primary Laundry Responsibility: Primary Cleaning Responsibility: Primary Shopping Responsibility: Primary Current License: Yes Mode of Transportation: Car Occupation: Retired Prior Function Level of Independence: Independent with basic ADLs, Independent with homemaking with ambulation, Independent with gait, Independent with transfers  Able to Take Stairs?: Yes Driving: Yes Vocation: Retired Leisure: Hobbies-yes (Comment) Comments: enjoys gardening ADL  See Function Navigator Vision/Perception  Vision- History Baseline Vision/History: No visual deficits Patient Visual Report: No change from baseline Vision- Assessment Vision Assessment?: Yes Eye Alignment:  Within Functional Limits Ocular Range of Motion: Within Functional Limits Alignment/Gaze Preference: Within Defined Limits Tracking/Visual Pursuits: Decreased smoothness of horizontal tracking;Requires cues, head turns, or add eye shifts to track Visual Fields: No apparent deficits Depth Perception:  (WNL) Perception Comments: WFL  Cognition Overall Cognitive Status: Within Functional Limits for tasks assessed Arousal/Alertness: Awake/alert Orientation Level: Person;Place;Situation Person: Oriented Place: Oriented Situation: Oriented Attention: Sustained;Divided Sustained Attention: Appears intact Divided Attention: Impaired Divided Attention Impairment: Functional basic;Functional complex Awareness: Appears intact Safety/Judgment: Impaired Sensation Sensation Light Touch: Appears Intact Stereognosis: Not tested Hot/Cold: Not tested Proprioception: Appears Intact Coordination Gross Motor Movements are Fluid and Coordinated: Yes Fine Motor Movements are Fluid and Coordinated: Yes Finger Nose Finger Test: WFL 9 Hole Peg Test: Rt: 36 seconds,   Lt: 40 seconds Motor  Motor Motor: Abnormal postural alignment and control;Hemiplegia Motor - Skilled Clinical Observations: strong L lateral trunk lean, motor impersistance/hesitancy with initiation of L extremities Mobility  Bed Mobility Bed Mobility: Rolling Right;Rolling Left;Supine to Sit;Sit to Supine Rolling Right: 5: Supervision Rolling Right Details: Verbal cues for technique Rolling Left: 5: Supervision Rolling Left Details: Verbal cues for technique Supine to Sit: 4: Min guard Supine to Sit Details: Tactile cues for weight shifting;Tactile cues for posture;Verbal cues for technique;Verbal cues for precautions/safety Sit to Supine: 4: Min assist Sit to Supine - Details: Verbal cues for sequencing;Tactile cues for placement;Verbal cues for technique  Trunk/Postural Assessment  Cervical Assessment Cervical Assessment: Within  Functional Limits Thoracic Assessment Thoracic Assessment: Within Functional Limits Lumbar Assessment Lumbar Assessment: Within Functional Limits Postural Control Postural Control: Deficits on evaluation (L lateral trunk lean due to impaired perception of vertical, poor stepping/righting responses)  Balance Balance Balance Assessed: Yes Standardized Balance Assessment Standardized Balance Assessment: Berg Balance Test Berg Balance Test Sit to Stand: Needs moderate or maximal assist to stand Standing Unsupported: Needs several tries to stand 30 seconds unsupported Sitting with Back Unsupported but Feet Supported on Floor or Stool: Able to sit 2 minutes under supervision Stand to Sit: Needs assistance to sit Transfers: Needs one person to assist Standing Unsupported with Eyes Closed: Able to stand 10 seconds with supervision Standing Ubsupported with Feet Together: Needs help to attain position and unable to hold for 15 seconds From Standing, Reach Forward with Outstretched Arm: Loses balance while trying/requires external support From Standing Position, Pick up Object from Floor: Unable to try/needs assist to keep balance From Standing Position, Turn to Look Behind Over each Shoulder: Needs assist to keep from losing balance and falling Turn 360 Degrees: Needs assistance while turning Standing Unsupported, Alternately Place Feet on Step/Stool: Needs assistance to keep from falling or unable to try Standing Unsupported, One Foot in Front: Loses balance while stepping or standing Standing on One Leg: Unable to try or needs assist to prevent fall Total Score: 8 Static Sitting Balance Static Sitting - Balance Support: Feet supported;No upper extremity supported Static Sitting - Level of Assistance: 5: Stand by assistance Static Sitting - Comment/# of Minutes: x2 min EOB Dynamic Sitting Balance Dynamic Sitting - Balance Support: Feet supported;No upper extremity supported;During functional  activity Dynamic Sitting - Level of Assistance: 5: Stand by assistance Static Standing Balance Static Standing - Balance Support: No upper extremity supported Static Standing - Level of Assistance: 3: Mod assist Static Standing - Comment/# of Minutes: x2 min in [place]; initially requires modA however decreased to standbyA  Dynamic Standing Balance Dynamic Standing - Balance Support: Bilateral upper extremity supported;During functional activity Dynamic Standing - Level of Assistance: 3: Mod assist Dynamic Standing - Balance Activities: Reaching across midline;Reaching for objects;Forward lean/weight shifting;Lateral lean/weight shifting Extremity/Trunk Assessment RUE Assessment RUE Assessment: Within Functional Limits LUE Assessment LUE Assessment: Within Functional Limits   See Function Navigator for Current Functional Status.   Refer to Care Plan for Long Term Goals  Recommendations for other services: Neuropsych  Discharge Criteria: Patient will be discharged from OT if patient refuses treatment 3 consecutive times without medical reason, if treatment goals not met, if there is a change in medical status, if patient makes no progress towards goals or if patient is discharged from hospital.  The above assessment, treatment plan, treatment alternatives and goals were discussed and mutually agreed upon: by patient and by family  Othell Diluzio, [facility] 08/30/2015, 1:18 PM

## 2015-08-30 NOTE — Care Management Note (Signed)
[facility]patient [facility] Individual Statement of Services  Patient Name:  Julie Stein  Date:  08/30/2015  Welcome to the [facility].  Our goal is to provide you with an individualized program based on your diagnosis and situation, designed to meet your specific needs.  With this comprehensive rehabilitation program, you will be expected to participate in at least 3 hours of rehabilitation therapies Monday-Friday, with modified therapy programming on the weekends.  Your rehabilitation program will include the following services:  Physical Therapy (PT), Occupational Therapy (OT), Speech Therapy (ST), 24 hour per day rehabilitation nursing, Therapeutic Recreaction (TR), Case Management (Social Worker), Rehabilitation Medicine, Nutrition Services and Pharmacy Services  Weekly team conferences will be held on Wednesday to discuss your progress.  Your Social Worker will talk with you frequently to get your input and to update you on team discussions.  Team conferences with you and your family in attendance may also be held.  Expected length of stay: 10-14 days  Overall anticipated outcome: supervision/mod/i level  Depending on your progress and recovery, your program may change. Your Social Worker will coordinate services and will keep you informed of any changes. Your Social Worker's name and contact numbers are listed  below.  The following services may also be recommended but are not provided by the Inpatient [facility]:   Driving Evaluations  Home Health Rehabiltiation Services  Outpatient Rehabilitation Services    Arrangements will be made to provide these services after discharge if needed.  Arrangements include referral to agencies that provide these services.  Your insurance has been verified to be:  Medicare part A only Your primary doctor is:  None  Pertinent information will be shared with your doctor and your insurance company.  Social  Worker:  Dossie Der, SW [phone] or ([phone]  Information discussed with and copy given to patient by: Lucy Chris, 08/30/2015, 1:28 PM

## 2015-08-30 NOTE — Progress Notes (Signed)
VASCULAR LAB PRELIMINARY  PRELIMINARY  PRELIMINARY  PRELIMINARY  Renal Duplex Completed.  Bilateral renal arteries were well visualized with abnormal spectral waveforms noted throughout the right and left renal arteries with no evidence of stenosis seen.   Chauncy Lean, RVT 08/30/2015, 10:49 AM

## 2015-08-30 NOTE — Progress Notes (Signed)
Trish Mage, RN Rehab Admission Coordinator Signed Physical Medicine and Rehabilitation PMR Pre-admission 08/29/2015 3:15 PM  Related encounter: ED to Hosp-Admission (Discharged) from 08/26/2015 in [facility]    Expand All Collapse All   PMR Admission Coordinator Pre-Admission Assessment  Patient: Julie Stein is an 80 y.o., female MRN: [id number] DOB: 1935/10/13 Height: 4\' 11"  (149.9 cm) Weight: 49.6 kg (109 lb 5.6 oz)  Insurance Information HMO: No PPO: PCP: IPA: 80/20: OTHER:  PRIMARY: Medicare part A only Policy#: [id number] Subscriber: Lyn Henri CM Name: Phone#: Fax#:  Pre-Cert#: Employer: Not employed Benefits: Phone #: Name: Checked in [place]. Date: 04/17/02 only part A Deduct: $1288 Out of Pocket Max: none Life Max: unlimited CIR: 100% SNF: 100 days Outpatient: Co-Pay:  Home Health: 100% Co-Pay: none DME: Co-Pay:  Providers: patient's choice  Medicaid Application Date: Case Manager:  Disability Application Date: Case Worker:   Emergency Contact Information Contact Information    Name Relation Home Work Mobile   Montoro,James Spouse [phone]  [phone]   Kulig,Wanda Daughter   [phone]   Mehl,Rasheedah Daughter [phone]  [phone]     Current Medical History  Patient Admitting Diagnosis: Watershed infarct pattern  History of Present Illness: A 80 y.o. right handed female history of hypertension. Patient lives with grandson and his wife large extended supportive family. Independent prior to admission and active. One level home 6 steps to entry. Presented 08/26/2015 with sudden onset of shortness of breath noted recent  bouts of diarrhea, congestion and cough. Chest x-ray shows significant pulmonary edema. EKG with frequent PVCs bigeminy 2 runs of SVT heart rate 200 bpm. She became hypotensive placed on amiodarone drip. Bedside echocardiogram with ejection fraction 20-25% diffuse hypokinesis, RV functioning well. Placed on broad-spectrum antibiotic suspect viral illness. Troponin mildly elevated 0.22 days 0.29. Cardiology service is consulted and underwent cardiac catheterization showing severe distal LAD stenosis moderate ostial large OMI stenosis. Advised medical management. Post catheterization noted left-sided weakness. MRI of the brain showed small volume patchy cortical and subcortical ischemic infarcts within the left frontal and right frontoparietal region with additional tiny cortical infarcts within the right occipital lobe. MRA of the head with no major vessel occlusion or stenosis. Placed on aspirin for CVA prophylaxis. Iniatially on contact droplet precautions but they have been discontinued. Physical therapy evaluation completed 08/29/2015 with recommendations of physical medicine rehabilitation consult.   Total: 0=NIH  Past Medical History  Past Medical History  Diagnosis Date  . Hypertension     Family History  family history is not on file.  Prior Rehab/Hospitalizations: No previous rehab admissions.   Has the patient had major surgery during 100 days prior to admission? No  Current Medications   Current facility-administered medications:  . Place/Maintain arterial line, , , Until Discontinued **AND** 0.9 % sodium chloride infusion, , Intra-arterial, PRN, Bernadene Person, NP . 0.9 % sodium chloride infusion, 250 mL, Intravenous, PRN, Laurey Morale, MD . acetaminophen (TYLENOL) tablet 650 mg, 650 mg, Oral, Q4H PRN, Laurey Morale, MD, 650 mg at 08/29/15 0104 . antiseptic oral rinse (CPC / CETYLPYRIDINIUM CHLORIDE 0.05%) solution 7 mL, 7 mL, Mouth Rinse, q12n4p, Lars Masson, MD, 7 mL at 08/28/15 1600 . aspirin EC tablet 325 mg, 325 mg, Oral, Daily, Marvel Plan, MD, 325 mg at 08/29/15 1000 . atorvastatin (LIPITOR) tablet 80 mg, 80 mg, Oral, q1800, Laurey Morale, MD, 80 mg at 08/28/15 1638 . azithromycin (ZITHROMAX) tablet 250 mg, 250 mg, Oral,  Daily, Earnie Larsson, RPH . carvedilol (COREG) tablet 12.5 mg, 12.5 mg, Oral, BID WC, Laurey Morale, MD, 12.5 mg at 08/29/15 0800 . chlorhexidine (PERIDEX) 0.12 % solution 15 mL, 15 mL, Mouth Rinse, BID, Lars Masson, MD, 15 mL at 08/29/15 1000 . insulin aspart (novoLOG) injection 0-15 Units, 0-15 Units, Subcutaneous, 6 times per day, Bernadene Person, NP, 2 Units at 08/29/15 0439 . isosorbide-hydrALAZINE (BIDIL) 20-37.5 MG per tablet 1 tablet, 1 tablet, Oral, TID, Laurey Morale, MD, 1 tablet at 08/29/15 1000 . nitroGLYCERIN 50 mg in dextrose 5 % 250 mL (0.2 mg/mL) infusion, 0-200 mcg/min, Intravenous, Continuous, Laurey Morale, MD, Stopped at 08/28/15 1230 . ondansetron (ZOFRAN) injection 4 mg, 4 mg, Intravenous, Q6H PRN, Laurey Morale, MD, 4 mg at 08/28/15 1706 . sacubitril-valsartan (ENTRESTO) 24-26 mg per tablet, 1 tablet, Oral, BID, Laurey Morale, MD, 1 tablet at 08/29/15 1000 . sodium chloride 0.9 % injection 3 mL, 3 mL, Intravenous, Q12H, Lars Masson, MD, 3 mL at 08/29/15 1000 . sodium chloride 0.9 % injection 3 mL, 3 mL, Intravenous, Q12H, Laurey Morale, MD, 3 mL at 08/29/15 1000 . sodium chloride 0.9 % injection 3 mL, 3 mL, Intravenous, PRN, Laurey Morale, MD  Patients Current Diet: Diet Heart Room service appropriate?: Yes; Fluid consistency:: Thin Diet - low sodium heart healthy Diet - low sodium heart healthy  Precautions / Restrictions Precautions Precautions: Fall Restrictions Weight Bearing Restrictions: No   Has the patient had 2 or more falls or a fall with injury in the past year?No  Prior Activity Level Community (5-7x/wk): Goes out 3-4 X a week,  was driving.  Home Assistive Devices / Equipment Home Assistive Devices/Equipment: None Home Equipment: None  Prior Device Use: Indicate devices/aids used by the patient prior to current illness, exacerbation or injury? None  Prior Functional Level Prior Function Level of Independence: Independent  Self Care: Did the patient need help bathing, dressing, using the toilet or eating? Independent  Indoor Mobility: Did the patient need assistance with walking from room to room (with or without device)? Independent  Stairs: Did the patient need assistance with internal or external stairs (with or without device)? Independent  Functional Cognition: Did the patient need help planning regular tasks such as shopping or remembering to take medications? Independent. The only thing patient says she needed help with was gardening.  Current Functional Level Cognition  Overall Cognitive Status: Impaired/Different from baseline Orientation Level: Oriented X4 Following Commands: Follows one step commands inconsistently Safety/Judgement: Decreased awareness of safety, Decreased awareness of deficits General Comments: Pt had difficulty following commands with delayed processing of information at times. Pt had trouble following commands for eye movements.    Extremity Assessment (includes Sensation/Coordination)  Upper Extremity Assessment: Defer to OT evaluation  Lower Extremity Assessment: LLE deficits/detail LLE Deficits / Details: grossly 3+/5 LLE Sensation: decreased proprioception LLE Coordination: decreased fine motor, decreased gross motor    ADLs  Anticipate ADL deficits and needs for ongoing interventions    Mobility  General bed mobility comments: Pt in chair on arrival.     Transfers  Overall transfer level: Needs assistance Equipment used: Rolling walker (2 wheeled) Transfers: Sit to/from Stand Sit to Stand: Mod assist, +2 physical assistance General transfer  comment: Pt able to perform sit to stand with mod assist and cues as pt leans to left and has difficulty with coordination of movement on left hemibody.     Ambulation / Gait / Stairs /  Wheelchair Mobility  Ambulation/Gait Ambulation/Gait assistance: Mod assist, +2 physical assistance Ambulation Distance (Feet): 18 Feet Assistive device: Rolling walker (2 wheeled), 2 person hand held assist Gait Pattern/deviations: Step-to pattern, Decreased stride length, Decreased step length - left, Decreased stance time - left, Decreased stance time - right, Decreased weight shift to right, Decreased weight shift to left, Ataxic, Staggering left, Staggering right, Trunk flexed, Narrow base of support General Gait Details: Pt with very poor postural stability overall. Pt unable to maintain upright trunk with left lateral lean throughout. Pt needed cues and assist to move left LE as pt could not coordinate the LE. Also needed assist to weight shift to right to unweight left hemibody. Question whether pt had decr awareness of right hemibody as well with questionable visual changes in right visual field. With ambulation, PT giving heavy assist at times to keep pt upright. Initially pt was ambulating without device first 6 steps and then used RW. Even with RW, pt still required extensive assist. Pt's family in room and PT had family assist pt with ambulation so they were aware of the assist given as intially they were convinced pt could go home, however once they tried to assist pt, they were agreeable that pt needs further therapy prior to d/c home.  Gait velocity interpretation: Below normal speed for age/gender    Posture / Balance Balance Overall balance assessment: Needs assistance Postural control: Left lateral lean Standing balance support: Bilateral upper extremity supported, During functional activity Standing balance-Leahy Scale: Poor Standing balance comment: Pt needs mod assist to stand  with left lateral lean. Must have bil UE support.     Special needs/care/consideration BiPAP/CPAP No CPM No Continuous Drip IV No  Dialysis No  Life Vest No Oxygen No Special Bed No Trach Size No Wound Vac (area) No  Skin No  Bowel mgmt: Last BM 08/26/15 Bladder mgmt: Voiding on bedpan WDL Diabetic mgmt No    Previous Home Environment Living Arrangements: Other relatives (lives with grandson and his wife, has a large family) Available Help at Discharge: Family, Available 24 hours/day Type of Home: House Home Layout: One level Home Access: Stairs to enter Entrance Stairs-Rails: Right, Left, Can reach both Entrance Stairs-Number of Steps: 6 Bathroom Shower/Tub: Health visitor: Standard Bathroom Accessibility: Yes Home Care Services: No Additional Comments: Pt was gardening, doing yard work, cleaned house - was fully independent PTA.   Discharge Living Setting Plans for Discharge Living Setting: Patient's home, House, Lives with (comment) (Grandson and his wife live with patient.) Type of Home at Discharge: House Discharge Home Layout: One level Discharge Home Access: Stairs to enter Entrance Stairs-Number of Steps: 3-4 steps at back and 7 steps at front entry. Does the patient have any problems obtaining your medications?: No  Social/Family/Support Systems Patient Roles: Spouse, Parent (Has a spouse, but not living with spouse.) Contact Information: See emergency contacts Anticipated Caregiver: Has a large extended family who can stay with her. Ability/Limitations of Caregiver: Lucila Maine and his wife go to school but can assist. Dtr can also assist. Caregiver Availability: 24/7 Discharge Plan Discussed with Primary Caregiver: Yes Is Caregiver In Agreement with Plan?: Yes Does Caregiver/Family have Issues with Lodging/Transportation while Pt is in Rehab?: No  Goals/Additional Needs Patient/Family Goal for  Rehab: PT/OT supervision goals Expected length of stay: 7-10 days Cultural Considerations: Muslim, does not eat pork. Dietary Needs: Heart diet, thin liquids Equipment Needs: TBD Pt/Family Agrees to Admission and willing to participate: Yes Program Orientation Provided & Reviewed  with Pt/Caregiver Including Roles & Responsibilities: Yes  Decrease burden of Care through IP rehab admission: N/A  Possible need for SNF placement upon discharge: Not anticipated  Patient Condition: This patient's condition remains as documented in the consult dated 08/29/15, in which the Rehabilitation Physician determined and documented that the patient's condition is appropriate for intensive rehabilitative care in an inpatient rehabilitation facility. Will admit to inpatient rehab today.  Preadmission Screen Completed By: Trish Mage, 08/29/2015 3:28 PM ______________________________________________________________________  Discussed status with Dr. Allena Katz on 08/29/15 at 1526 and received telephone approval for admission today.  Admission Coordinator: Trish Mage, time 1526/Date10/12/16          Cosigned by: Ankit Karis Juba, MD at 08/29/2015 4:01 PM  Revision History     Date/Time User Provider Type Action   08/29/2015 4:01 PM Ankit Karis Juba, MD Physician Cosign   08/29/2015 3:28 PM Trish Mage, RN Rehab Admission Coordinator Sign

## 2015-08-30 NOTE — Evaluation (Signed)
Physical Therapy Assessment and Plan  Patient Details  Name: Julie Stein MRN: [id number] Date of Birth: Feb 15, 1935  PT Diagnosis: Abnormality of gait, Difficulty walking, Hemiparesis non-dominant and Muscle weakness Rehab Potential: Excellent ELOS: 10-14 days   Today's Date: 08/30/2015 PT Individual Time: 0800-0925 PT Individual Time Calculation (min): 85 min    Problem List:  Patient Active Problem List   Diagnosis Date Noted  . Acute bilat watershed infarction [facility]) 08/29/2015  . Weakness   . Encounter for central line placement   . Acute respiratory failure with hypercapnia ([place]) 08/26/2015  . Essential hypertension 08/26/2015  . Acute systolic CHF (congestive heart failure), NYHA class 4 ([place]) 08/26/2015  . Pulmonary edema 08/26/2015  . Paroxysmal SVT (supraventricular tachycardia) ([place]) 08/26/2015  . Respiratory failure with hypoxia ([place]) 08/26/2015  . Acute heart failure ([place])   . Acute respiratory failure with hypoxia ([place])   . SVT (supraventricular tachycardia) ([facility])     Past Medical History:  Past Medical History  Diagnosis Date  . Hypertension    Past Surgical History:  Past Surgical History  Procedure Laterality Date  . Cardiac catheterization N/A 08/27/2015    Procedure: Right/Left Heart Cath and Coronary Angiography;  Surgeon: Larey Dresser, MD;  Location: [place] [facility];  Service: Cardiovascular;  Laterality: N/A;    Assessment & Plan Clinical Impression:A 80 y.o. right handed female history of hypertension. Patient lives with grandson and his wife large extended supportive family. Independent prior to admission and active. One level home 6 steps to entry. Presented 08/26/2015 with sudden onset of shortness of breath noted recent bouts of diarrhea, congestion and cough. Chest x-ray shows significant pulmonary edema. EKG with frequent PVCs bigeminy 2 runs of SVT heart rate 200 bpm. She became hypotensive placed on amiodarone drip. Bedside echocardiogram  with ejection fraction 20-25% diffuse hypokinesis, RV functioning well. Placed on broad-spectrum antibiotic suspect viral illness. Troponin mildly elevated 0.22 days 0.29. Cardiology service is consulted and underwent cardiac catheterization showing severe distal LAD stenosis moderate ostial large OMI stenosis. Advised medical management. Post catheterization noted left-sided weakness. MRI of the brain showed small volume patchy cortical and subcortical ischemic infarcts within the left frontal and right frontoparietal region with additional tiny cortical infarcts within the right occipital lobe. MRA of the head with no major vessel occlusion or stenosis. Placed on aspirin for CVA prophylaxis. Iniatially on contact droplet precautions but they have been discontinued. Physical therapy evaluation completed 08/29/2015 with recommendations of physical medicine rehabilitation consult.  Patient transferred to CIR on 08/29/2015 .   Patient currently requires max with mobility secondary to muscle weakness, decreased cardiorespiratoy endurance, unbalanced muscle activation, decreased midline orientation and decreased attention to left and decreased sitting balance, decreased standing balance, decreased postural control and decreased balance strategies.  Prior to hospitalization, patient was independent  with mobility and lived with Other (Comment) (grandson and his wife) in a House home.  Home access is 6Stairs to enter.  Patient will benefit from skilled PT intervention to maximize safe functional mobility, minimize fall risk and decrease caregiver burden for planned discharge home with 24 hour supervision.  Anticipate patient will benefit from follow up OP at discharge.  PT - End of Session Activity Tolerance: Tolerates 30+ min activity with multiple rests Endurance Deficit: Yes Endurance Deficit Description: requires rest breaks following mobility tasks PT Assessment Rehab Potential (ACUTE/IP ONLY):  Excellent PT Patient demonstrates impairments in the following area(s): Balance;Perception;Safety;Endurance;Motor PT Transfers Functional Problem(s): Bed Mobility;Bed to Chair;Car;Furniture;Floor PT Locomotion Functional Problem(s):  Stairs;Ambulation;Wheelchair Mobility PT Plan PT Intensity: Minimum of 1-2 x/day ,45 to 90 minutes PT Frequency: 5 out of 7 days PT Duration Estimated Length of Stay: 10-14 days PT Treatment/Interventions: Ambulation/gait training;Balance/vestibular training;Community reintegration;Discharge planning;DME/adaptive equipment instruction;Functional mobility training;Neuromuscular re-education;Patient/family education;Psychosocial support;Stair training;Therapeutic Exercise;UE/LE Coordination activities;Wheelchair propulsion/positioning;UE/LE Strength taining/ROM;Therapeutic Activities;Visual/perceptual remediation/compensation PT Transfers Anticipated Outcome(s): mod I PT Locomotion Anticipated Outcome(s): supevision with LRAD PT Recommendation Follow Up Recommendations: Outpatient PT Patient destination: Home Equipment Recommended: To be determined  Skilled Therapeutic Intervention Pt received supine in bed with daughter and granddaughter present. Initial PT evaluation performed and completed. See below for details regarding level of assist for all mobility. Pt initially required mod/maxA for all mobility due to strong L lateral trunk lean due to impaired perception of vertical, however with practice and cueing pt able to decrease to CGA/minA for mobility. Pt fatigues easily and requires rest breaks. Pt also emotional during session due to impairments and being unable to move and be active as before; pt was very independent PTA and does not like receiving help from others. Discussed role of therapy, daily schedule, use of call bell system to get up or use restroom, supervision level goals; pt and family agreeable to all the above. Pt remained supine in bed with alarm  intact and all needs within reach, family present at completion of session.   PT Evaluation Precautions/Restrictions Precautions Precautions: Fall Restrictions Weight Bearing Restrictions: No General Chart Reviewed: Yes Response to Previous Treatment: Not applicable Family/Caregiver Present: Yes Vital Signs  Pain Pain Assessment Pain Assessment: No/denies pain Pain Score: 0-No pain Home Living/Prior Functioning Home Living Available Help at Discharge: Family;Available 24 hours/day (grandson and wife in school during the day, but daughter and granddaughter available) Type of Home: House Home Access: Stairs to enter Technical brewer of Steps: 6 Entrance Stairs-Rails: Right;Left;Can reach both Home Layout: One level Bathroom Shower/Tub: Multimedia programmer: Standard Bathroom Accessibility: Yes Additional Comments: Pt was gardening, doing yard work, cleaned house - was fully independent PTA.   Lives With: Other (Comment) (grandson and his wife) Prior Function Level of Independence: Independent with basic ADLs;Independent with homemaking with ambulation;Independent with gait;Independent with transfers  Able to Take Stairs?: Yes Driving: Yes Vocation: Retired Leisure: Hobbies-yes (Comment) Comments: enjoys gardening Vision/Perception  Perception Comments: WFL  Cognition Overall Cognitive Status: Within Functional Limits for tasks assessed Arousal/Alertness: Awake/alert Orientation Level: Oriented X4 Attention: Sustained;Divided Sustained Attention: Appears intact Divided Attention: Impaired Divided Attention Impairment: Functional basic;Functional complex Awareness: Appears intact Safety/Judgment: Impaired Sensation Sensation Light Touch: Appears Intact Stereognosis: Not tested Hot/Cold: Not tested Proprioception: Appears Intact (Accurate 10/10 BLE hallux) Coordination Gross Motor Movements are Fluid and Coordinated: Yes Motor  Motor Motor:  Abnormal postural alignment and control;Hemiplegia Motor - Skilled Clinical Observations: strong L lateral trunk lean, motor impersistance/hesitancy with initiation of L extremities  Mobility Bed Mobility Bed Mobility: Rolling Right;Rolling Left;Supine to Sit;Sit to Supine Rolling Right: 5: Supervision Rolling Right Details: Verbal cues for technique Rolling Left: 5: Supervision Rolling Left Details: Verbal cues for technique Supine to Sit: 4: Min guard Supine to Sit Details: Tactile cues for weight shifting;Tactile cues for posture;Verbal cues for technique;Verbal cues for precautions/safety Sit to Supine: 4: Min assist Sit to Supine - Details: Verbal cues for sequencing;Tactile cues for placement;Verbal cues for technique Transfers Transfers: Yes Stand Pivot Transfers: 2: Max assist Stand Pivot Transfer Details: Tactile cues for posture;Tactile cues for weight shifting;Tactile cues for placement;Verbal cues for precautions/safety;Verbal cues for technique Stand Pivot Transfer Details (indicate cue type  and reason): initially required maxA due to strong L lateral lean; by end of eval pt performing with minA and improved alignment/safety Locomotion  Ambulation Ambulation: Yes Ambulation/Gait Assistance: 2: Max assist (initially maxA however decreased to CGA by end of second trial with improve posture and alignment) Ambulation Distance (Feet): 150 Feet Assistive device: Rolling walker Ambulation/Gait Assistance Details: Tactile cues for weight shifting;Tactile cues for posture;Verbal cues for safe use of DME/AE;Verbal cues for precautions/safety;Verbal cues for technique Gait Gait: Yes Gait Pattern: Impaired Gait Pattern: Poor foot clearance - left;Trunk flexed;Lateral trunk lean to left;Decreased weight shift to right;Decreased stride length Gait velocity: decreased for age/gender norms Stairs / Additional Locomotion Stairs: Yes Stairs Assistance: 3: Mod assist Stairs Assistance  Details: Tactile cues for posture;Tactile cues for weight shifting;Tactile cues for placement;Verbal cues for precautions/safety;Verbal cues for gait pattern Stairs Assistance Details (indicate cue type and reason): cues for hand placement due to posterior/L lateral bias Stair Management Technique: Two rails;Forwards;Alternating pattern;Step to pattern Number of Stairs: 8 Height of Stairs: 3 Wheelchair Mobility Wheelchair Mobility: Yes Wheelchair Assistance: 4: Hospital doctor: Both upper extremities Wheelchair Parts Management: Needs assistance Distance: 100  Trunk/Postural Assessment  Cervical Assessment Cervical Assessment: Within Functional Limits Thoracic Assessment Thoracic Assessment: Within Functional Limits Lumbar Assessment Lumbar Assessment: Within Functional Limits Postural Control Postural Control: Deficits on evaluation (L lateral trunk lean due to impaired perception of vertical, poor stepping/righting responses)  Balance Balance Balance Assessed: Yes Standardized Balance Assessment Standardized Balance Assessment: Berg Balance Test Berg Balance Test Sit to Stand: Needs moderate or maximal assist to stand Standing Unsupported: Needs several tries to stand 30 seconds unsupported Sitting with Back Unsupported but Feet Supported on Floor or Stool: Able to sit 2 minutes under supervision Stand to Sit: Needs assistance to sit Transfers: Needs one person to assist Standing Unsupported with Eyes Closed: Able to stand 10 seconds with supervision Standing Ubsupported with Feet Together: Needs help to attain position and unable to hold for 15 seconds From Standing, Reach Forward with Outstretched Arm: Loses balance while trying/requires external support From Standing Position, Pick up Object from Floor: Unable to try/needs assist to keep balance From Standing Position, Turn to Look Behind Over each Shoulder: Needs assist to keep from losing balance and  falling Turn 360 Degrees: Needs assistance while turning Standing Unsupported, Alternately Place Feet on Step/Stool: Needs assistance to keep from falling or unable to try Standing Unsupported, One Foot in Front: Loses balance while stepping or standing Standing on One Leg: Unable to try or needs assist to prevent fall Total Score: 8 Static Sitting Balance Static Sitting - Balance Support: Feet supported;No upper extremity supported Static Sitting - Level of Assistance: 5: Stand by assistance Static Sitting - Comment/# of Minutes: x2 min EOB Dynamic Sitting Balance Dynamic Sitting - Balance Support: Feet supported;No upper extremity supported;During functional activity Dynamic Sitting - Level of Assistance: 5: Stand by assistance Static Standing Balance Static Standing - Balance Support: No upper extremity supported Static Standing - Level of Assistance: 3: Mod assist Static Standing - Comment/# of Minutes: x2 min in [place]; initially requires modA however decreased to standbyA  Dynamic Standing Balance Dynamic Standing - Balance Support: Bilateral upper extremity supported;During functional activity Dynamic Standing - Level of Assistance: 3: Mod assist Dynamic Standing - Balance Activities: Reaching across midline;Reaching for objects;Forward lean/weight shifting;Lateral lean/weight shifting Extremity Assessment  RUE Assessment RUE Assessment: Within Functional Limits LUE Assessment LUE Assessment: Within Functional Limits RLE Assessment RLE Assessment: Within Functional Limits LLE Assessment  LLE Assessment: Within Functional Limits (grossly 4+/5 to 5/5 throughout)   See Function Navigator for Current Functional Status.   Refer to Care Plan for Long Term Goals  Recommendations for other services: Neuropsych  Discharge Criteria: Patient will be discharged from PT if patient refuses treatment 3 consecutive times without medical reason, if treatment goals not met, if there is a  change in medical status, if patient makes no progress towards goals or if patient is discharged from hospital.  The above assessment, treatment plan, treatment alternatives and goals were discussed and mutually agreed upon: by patient and by family  Luberta Mutter 08/30/2015, 9:57 AM

## 2015-08-31 ENCOUNTER — Encounter (HOSPITAL_COMMUNITY): Payer: Self-pay | Admitting: Internal Medicine

## 2015-08-31 ENCOUNTER — Inpatient Hospital Stay (HOSPITAL_COMMUNITY): Payer: Medicare Other | Admitting: Physical Therapy

## 2015-08-31 ENCOUNTER — Inpatient Hospital Stay (HOSPITAL_COMMUNITY): Payer: Medicare Other

## 2015-08-31 ENCOUNTER — Observation Stay (HOSPITAL_COMMUNITY): Payer: Medicare Other

## 2015-08-31 ENCOUNTER — Inpatient Hospital Stay (HOSPITAL_COMMUNITY): Payer: Medicare Other | Admitting: Occupational Therapy

## 2015-08-31 ENCOUNTER — Telehealth: Payer: Self-pay | Admitting: Registered Nurse

## 2015-08-31 ENCOUNTER — Inpatient Hospital Stay (HOSPITAL_COMMUNITY)
Admission: RE | Admit: 2015-08-31 | Discharge: 2015-09-06 | DRG: 274 | Disposition: A | Discharge status: Rehabilitation Facility | Payer: Medicare Other | Source: Ambulatory Visit | Attending: Internal Medicine | Admitting: Internal Medicine

## 2015-08-31 ENCOUNTER — Other Ambulatory Visit: Payer: Self-pay

## 2015-08-31 DIAGNOSIS — I428 Other cardiomyopathies: Secondary | ICD-10-CM | POA: Diagnosis present

## 2015-08-31 DIAGNOSIS — I2510 Atherosclerotic heart disease of native coronary artery without angina pectoris: Secondary | ICD-10-CM | POA: Diagnosis present

## 2015-08-31 DIAGNOSIS — Z7982 Long term (current) use of aspirin: Secondary | ICD-10-CM

## 2015-08-31 DIAGNOSIS — I25708 Atherosclerosis of coronary artery bypass graft(s), unspecified, with other forms of angina pectoris: Secondary | ICD-10-CM

## 2015-08-31 DIAGNOSIS — R55 Syncope and collapse: Secondary | ICD-10-CM | POA: Diagnosis present

## 2015-08-31 DIAGNOSIS — I5022 Chronic systolic (congestive) heart failure: Secondary | ICD-10-CM | POA: Diagnosis present

## 2015-08-31 DIAGNOSIS — D509 Iron deficiency anemia, unspecified: Secondary | ICD-10-CM | POA: Diagnosis present

## 2015-08-31 DIAGNOSIS — N183 Chronic kidney disease, stage 3 (moderate): Secondary | ICD-10-CM | POA: Diagnosis present

## 2015-08-31 DIAGNOSIS — Z8673 Personal history of transient ischemic attack (TIA), and cerebral infarction without residual deficits: Secondary | ICD-10-CM | POA: Insufficient documentation

## 2015-08-31 DIAGNOSIS — R0600 Dyspnea, unspecified: Secondary | ICD-10-CM

## 2015-08-31 DIAGNOSIS — I130 Hypertensive heart and chronic kidney disease with heart failure and stage 1 through stage 4 chronic kidney disease, or unspecified chronic kidney disease: Secondary | ICD-10-CM | POA: Diagnosis present

## 2015-08-31 DIAGNOSIS — E785 Hyperlipidemia, unspecified: Secondary | ICD-10-CM | POA: Diagnosis present

## 2015-08-31 DIAGNOSIS — I69354 Hemiplegia and hemiparesis following cerebral infarction affecting left non-dominant side: Secondary | ICD-10-CM

## 2015-08-31 DIAGNOSIS — I471 Supraventricular tachycardia: Principal | ICD-10-CM | POA: Diagnosis present

## 2015-08-31 DIAGNOSIS — I429 Cardiomyopathy, unspecified: Secondary | ICD-10-CM | POA: Diagnosis present

## 2015-08-31 DIAGNOSIS — R079 Chest pain, unspecified: Secondary | ICD-10-CM

## 2015-08-31 DIAGNOSIS — J9811 Atelectasis: Secondary | ICD-10-CM | POA: Diagnosis not present

## 2015-08-31 DIAGNOSIS — T4275XA Adverse effect of unspecified antiepileptic and sedative-hypnotic drugs, initial encounter: Secondary | ICD-10-CM | POA: Diagnosis present

## 2015-08-31 DIAGNOSIS — I10 Essential (primary) hypertension: Secondary | ICD-10-CM

## 2015-08-31 DIAGNOSIS — I959 Hypotension, unspecified: Secondary | ICD-10-CM | POA: Diagnosis present

## 2015-08-31 HISTORY — DX: Cerebral infarction, unspecified: I63.9

## 2015-08-31 HISTORY — DX: Cardiomyopathy, unspecified: I42.9

## 2015-08-31 HISTORY — DX: Supraventricular tachycardia: I47.1

## 2015-08-31 HISTORY — DX: Heart failure, unspecified: I50.9

## 2015-08-31 HISTORY — DX: Atherosclerotic heart disease of native coronary artery without angina pectoris: I25.10

## 2015-08-31 HISTORY — DX: Supraventricular tachycardia, unspecified: I47.10

## 2015-08-31 LAB — CBC WITH DIFFERENTIAL/PLATELET
BASOS ABS: 0 10*3/uL (ref 0.0–0.1)
BASOS PCT: 0 %
EOS PCT: 2 %
Eosinophils Absolute: 0.2 10*3/uL (ref 0.0–0.7)
HCT: 32.3 % — ABNORMAL LOW (ref 36.0–46.0)
Hemoglobin: 10.8 g/dL — ABNORMAL LOW (ref 12.0–15.0)
Lymphocytes Relative: 10 %
Lymphs Abs: 0.9 10*3/uL (ref 0.7–4.0)
MCH: 23.6 pg — ABNORMAL LOW (ref 26.0–34.0)
MCHC: 33.4 g/dL (ref 30.0–36.0)
MCV: 70.7 fL — ABNORMAL LOW (ref 78.0–100.0)
MONO ABS: 0.4 10*3/uL (ref 0.1–1.0)
Monocytes Relative: 5 %
NEUTROS ABS: 7.2 10*3/uL (ref 1.7–7.7)
Neutrophils Relative %: 83 %
PLATELETS: 266 10*3/uL (ref 150–400)
RBC: 4.57 MIL/uL (ref 3.87–5.11)
RDW: 15.7 % — AB (ref 11.5–15.5)
WBC: 8.7 10*3/uL (ref 4.0–10.5)

## 2015-08-31 LAB — COMPREHENSIVE METABOLIC PANEL
ALBUMIN: 3 g/dL — AB (ref 3.5–5.0)
ALT: 24 U/L (ref 14–54)
ANION GAP: 9 (ref 5–15)
AST: 33 U/L (ref 15–41)
Alkaline Phosphatase: 65 U/L (ref 38–126)
BILIRUBIN TOTAL: 0.7 mg/dL (ref 0.3–1.2)
BUN: 15 mg/dL (ref 6–20)
CALCIUM: 9.4 mg/dL (ref 8.9–10.3)
CO2: 26 mmol/L (ref 22–32)
Chloride: 105 mmol/L (ref 101–111)
Creatinine, Ser: 1.19 mg/dL — ABNORMAL HIGH (ref 0.44–1.00)
GFR calc Af Amer: 49 mL/min — ABNORMAL LOW (ref >60)
GFR calc non Af Amer: 43 mL/min — ABNORMAL LOW (ref >60)
GLUCOSE: 154 mg/dL — AB (ref 65–99)
Potassium: 3.6 mmol/L (ref 3.5–5.1)
Sodium: 140 mmol/L (ref 135–145)
TOTAL PROTEIN: 6.6 g/dL (ref 6.5–8.1)

## 2015-08-31 LAB — TROPONIN I
Troponin I: 0.03 ng/mL (ref <0.031)
Troponin I: <0.03 ng/mL (ref <0.031)
Troponin I: 0.03 ng/mL (ref <0.031)

## 2015-08-31 LAB — MAGNESIUM: MAGNESIUM: 2.1 mg/dL (ref 1.7–2.4)

## 2015-08-31 MED ORDER — ONDANSETRON HCL 4 MG PO TABS: 4.0000 mg | ORAL_TABLET | Freq: Four times a day (QID) | ORAL | Status: DC | PRN

## 2015-08-31 MED ORDER — ONDANSETRON HCL 4 MG/2ML IJ SOLN: 4.0000 mg | Freq: Four times a day (QID) | INTRAMUSCULAR | Status: DC | PRN

## 2015-08-31 MED ORDER — ISOSORB DINITRATE-HYDRALAZINE 20-37.5 MG PO TABS: 1.0000 | ORAL_TABLET | Freq: Three times a day (TID) | ORAL | Status: DC

## 2015-08-31 MED ORDER — LOSARTAN POTASSIUM 25 MG PO TABS: 12.5000 mg | ORAL_TABLET | Freq: Every day | ORAL | Status: DC

## 2015-08-31 MED ORDER — NITROGLYCERIN 0.4 MG SL SUBL: 0.4000 mg | SUBLINGUAL_TABLET | SUBLINGUAL | Status: DC | PRN

## 2015-08-31 MED ORDER — SACUBITRIL-VALSARTAN 24-26 MG PO TABS
1.0000 | ORAL_TABLET | Freq: Two times a day (BID) | ORAL | Status: DC
  Filled 2015-08-31 (×2): qty 1

## 2015-08-31 MED ORDER — CARVEDILOL 12.5 MG PO TABS
12.5000 mg | ORAL_TABLET | Freq: Two times a day (BID) | ORAL | Status: DC
  Administered 2015-08-31 – 2015-09-01 (×4): 12.5 mg via ORAL
  Filled 2015-08-31 (×3): qty 1
  Filled 2015-08-31 (×2): qty 2

## 2015-08-31 MED ORDER — CARVEDILOL 12.5 MG PO TABS: 12.5000 mg | ORAL_TABLET | Freq: Two times a day (BID) | ORAL | Status: DC

## 2015-08-31 MED ORDER — ACETAMINOPHEN 650 MG RE SUPP: 650.0000 mg | Freq: Four times a day (QID) | RECTAL | Status: DC | PRN

## 2015-08-31 MED ORDER — ACETAMINOPHEN 325 MG PO TABS
650.0000 mg | ORAL_TABLET | Freq: Four times a day (QID) | ORAL | Status: DC | PRN
  Administered 2015-09-01 – 2015-09-04 (×4): 650 mg via ORAL
  Filled 2015-08-31 (×5): qty 2

## 2015-08-31 MED ORDER — ATORVASTATIN CALCIUM 80 MG PO TABS
80.0000 mg | ORAL_TABLET | Freq: Every day | ORAL | Status: DC
  Administered 2015-08-31 – 2015-09-05 (×6): 80 mg via ORAL
  Filled 2015-08-31 (×6): qty 1

## 2015-08-31 MED ORDER — SODIUM CHLORIDE 0.9 % IJ SOLN
3.0000 mL | Freq: Two times a day (BID) | INTRAMUSCULAR | Status: DC
  Administered 2015-09-01 – 2015-09-06 (×8): 3 mL via INTRAVENOUS

## 2015-08-31 MED ORDER — ASPIRIN EC 325 MG PO TBEC
325.0000 mg | DELAYED_RELEASE_TABLET | Freq: Every day | ORAL | Status: DC
  Administered 2015-08-31 – 2015-09-04 (×5): 325 mg via ORAL
  Filled 2015-08-31 (×5): qty 1

## 2015-08-31 MED ORDER — ISOSORB DINITRATE-HYDRALAZINE 20-37.5 MG PO TABS
0.5000 | ORAL_TABLET | Freq: Three times a day (TID) | ORAL | Status: DC
  Administered 2015-08-31 – 2015-09-04 (×14): 0.5 via ORAL
  Filled 2015-08-31 (×16): qty 1

## 2015-08-31 MED ORDER — LOSARTAN POTASSIUM 25 MG PO TABS
25.0000 mg | ORAL_TABLET | Freq: Every day | ORAL | Status: DC
  Administered 2015-08-31 – 2015-09-06 (×7): 25 mg via ORAL
  Filled 2015-08-31 (×7): qty 1

## 2015-08-31 MED ORDER — CARVEDILOL 6.25 MG PO TABS: 6.2500 mg | ORAL_TABLET | Freq: Two times a day (BID) | ORAL | Status: DC

## 2015-08-31 NOTE — Telephone Encounter (Signed)
I [name] NP, received a call from [facility] nurse Quentin Angst stating Ms. Julie Stein complaining of chest heaviness. VSS she denies chest pain.  A Trop and EKG was obtained and rapid response nurse (April) reviewed. No changes was noted and Ms. [name] states the heaviness had resolved.   Two hours later Staff placed Ms. [name] on the bedside commode her daughter was by her side, At 12:27 Ms. [name] had a syncopal or vasovagal episode and became pulseless. Chest Compressions were initiated and a Code was called. Rapid Response Nurse( April )called stating Ms. [name] needs to transferred off the unit. I [name] called Delle Reining to assist with transfer and Dr. Wynn Banker was notified of the above.

## 2015-08-31 NOTE — Consult Note (Addendum)
Advanced Heart Failure Team Consult Note  Referring Physician: Dr Toniann Fail  Primary Physician: Primary Cardiologist:  Dr Shirlee Latch  Reason for Consultation: Syncope  HPI:   Julie Stein is a 80 year old with a history of TIA, HTN, CAD, SVT and anemia readmitted from inpatient rehab earlier this morning with syncopal episode requiring brief CPR.   Recently admitted with chest pain and increased dyspnea. The first day in hospital, she had neurological changes with LLE weakness that was transient. Neurology consulted and MRI of head showed small infarcts in watershed region. ECHO performed on 10/9 with EF 20-25% and LVH.  Diuresed with IV lasix. Had RHC/LHC cath on 10/10 normal to low filling pressures and moderate to severe ostial OM1 and severe distal LAD stenosis. She was continued on statin and aspirin. Carotid doppler-08/29/2015. Findings consistent with 1-39 percent stenosis involving the right internal carotid artery. Due to lower extremity weakness she was discharged to inpatient rehab.   Late yesterday she complained of chest heaviness. EKG performed with no changes. Troponin was 0.05. Later she got up to bedside commode and collapsed. Lost pulse and she was given a couple chest compressions and  regained a pulse and she was neurologically intact.  She was transferred to back to step down. CXR showed ? Infiltrate RLL and probable small R pleural effusion (similar to prior). EKG today SR with frequent PVCs.  She was not orthostatic this morning.   Today she denies SOB/Orthopnea/CP   Pertinent admission labs include: troponin 0.03, Mag 2.1, K 3.6  Creatinine 1.19  WBC 8.7   Review of Systems: [y] = yes, [ ]  = no   General: Weight gain [ ] ; Weight loss [ ] ; Anorexia [ ] ; Fatigue [Y ]; Fever [ ] ; Chills [ ] ; Weakness [ ]   Cardiac: Chest pain/pressure [ ] ; Resting SOB [ ] ; Exertional SOB [ ] ; Orthopnea [ ] ; Pedal Edema [ ] ; Palpitations [ ] ; Syncope [ ] ; Presyncope [ ] ; Paroxysmal nocturnal  dyspnea[ ]   Pulmonary: Cough [ Y]; Wheezing[ ] ; Hemoptysis[ ] ; Sputum [ ] ; Snoring [ ]   GI: Vomiting[ ] ; Dysphagia[ ] ; Melena[ ] ; Hematochezia [ ] ; Heartburn[ ] ; Abdominal pain [ ] ; Constipation [ ] ; Diarrhea [ ] ; BRBPR [ ]   GU: Hematuria[ ] ; Dysuria [ ] ; Nocturia[ ]   Vascular: Pain in legs with walking [ ] ; Pain in feet with lying flat [ ] ; Non-healing sores [ ] ; Stroke [ ] ; TIA Y ]; Slurred speech [ ] ;  Neuro: Headaches[ ] ; Vertigo[ ] ; Seizures[ ] ; Paresthesias[ ] ;Blurred vision [ ] ; Diplopia [ ] ; Vision changes [ ]   Ortho/Skin: Arthritis [ ] ; Joint pain [ ] ; Muscle pain [ ] ; Joint swelling [ ] ; Back Pain [ ] ; Rash [ ]   Psych: Depression[ ] ; Anxiety[ ]   Heme: Bleeding problems [ ] ; Clotting disorders [ ] ; Anemia [ ]   Endocrine: Diabetes [ ] ; Thyroid dysfunction[ ]   Home Medications Prior to Admission medications   Medication Sig Start Date End Date Taking? Authorizing Provider  aspirin EC 325 MG EC tablet Take 1 tablet (325 mg total) by mouth daily. 08/29/15   Amy D Filbert Schilder, NP  atorvastatin (LIPITOR) 80 MG tablet Take 1 tablet (80 mg total) by mouth daily at 6 PM. 08/29/15   Amy D Clegg, NP  azithromycin (ZITHROMAX) 250 MG tablet Take 1 tablet (250 mg total) by mouth daily. 08/29/15   Amy D Clegg, NP  carvedilol (COREG) 12.5 MG tablet Take 1 tablet (12.5 mg total) by mouth 2 (two) times daily with  a meal. 08/29/15   Amy D Clegg, NP  COLLAGEN PO Take 1 capsule by mouth daily.    Historical Provider, MD  isosorbide-hydrALAZINE (BIDIL) 20-37.5 MG tablet Take 1 tablet by mouth 3 (three) times daily. 08/29/15   Amy D Filbert Schilder, NP  Multiple Vitamins-Minerals (MULTIVITAMIN) tablet Take 1 tablet by mouth daily.    Historical Provider, MD  sacubitril-valsartan (ENTRESTO) 24-26 MG Take 1 tablet by mouth 2 (two) times daily. 08/29/15   Amy Georgie Chard, NP    Past Medical History: Past Medical History  Diagnosis Date  . Hypertension   . CHF (congestive heart failure) (HCC)   . Stroke [facility])     Past  Surgical History: Past Surgical History  Procedure Laterality Date  . Cardiac catheterization N/A 08/27/2015    Procedure: Right/Left Heart Cath and Coronary Angiography;  Surgeon: Laurey Morale, MD;  Location: [facility] INVASIVE CV LAB;  Service: Cardiovascular;  Laterality: N/A;  . Knee surgery      Family History: Family History  Problem Relation Age of Onset  . Hypertension Daughter     Social History: Social History   Social History  . Marital Status: Married    Spouse Name: N/A  . Number of Children: N/A  . Years of Education: N/A   Social History Main Topics  . Smoking status: Never Smoker   . Smokeless tobacco: None  . Alcohol Use: No  . Drug Use: No  . Sexual Activity: Not Asked   Other Topics Concern  . None   Social History Narrative    Allergies:  Allergies  Allergen Reactions  . Demerol [Meperidine]     intolerance    Objective:    Vital Signs:   Temp:  [97.8 F (36.6 C)-98 F (36.7 C)] 98 F (36.7 C) (10/14 0313) Pulse Rate:  [81-95] 81 (10/14 0600) Resp:  [18-22] 19 (10/14 0600) BP: (96-140)/(62-102) 99/63 mmHg (10/14 0600) SpO2:  [96 %-97 %] 96 % (10/14 0313) Last BM Date: 08/30/15  Weight change: There were no vitals filed for this visit.  Intake/Output:  No intake or output data in the 24 hours ending 08/31/15 0741   Physical Exam: General:  Well appearing. No resp difficulty. In bed. Daughter at bedside.  HEENT: normal Neck: supple. JVP 5-6 . Carotids 2+ bilat; no bruits. No lymphadenopathy or thryomegaly appreciated. Cor: PMI nondisplaced. Regular rate & rhythm. No rubs, gallops or murmurs. Lungs: clear except decreased RLL Abdomen: soft, nontender, nondistended. No hepatosplenomegaly. No bruits or masses. Good bowel sounds. Extremities: no cyanosis, clubbing, rash, edema Neuro: alert & orientedx3, cranial nerves grossly intact. moves all 4 extremities w/o difficulty. Affect pleasant  Telemetry: NSR PVCs   Labs: Basic Metabolic  Panel:  Recent Labs Lab 08/26/15 1950  08/28/15 0530 08/28/15 0930 08/29/15 0442 08/30/15 0551 08/31/15 0319  NA 134*  --  136 139 139 139 140  K 3.6  --  3.6 3.3* 3.7 3.8 3.6  CL 95*  --  102 104 103 104 105  CO2 25  --  27 26 26 25 26   GLUCOSE 196*  --  111* 135* 128* 113* 154*  BUN 19  --  20 17 14 12 15   CREATININE 1.11*  < > 1.15* 1.05* 1.12* 1.04* 1.19*  CALCIUM 9.0  --  8.6* 9.0 9.2 9.4 9.4  MG 1.8  --  1.8  --   --   --  2.1  PHOS 3.9  --  2.6  --   --   --   --   < > =  values in this interval not displayed.  Liver Function Tests:  Recent Labs Lab 08/26/15 1306 08/26/15 1950 08/30/15 0551 08/31/15 0319  AST 42* 59* 27 33  ALT ALKPHOS 100 94 63 65  BILITOT 0.2* 0.3 0.6 0.7  PROT 7.6 7.5 6.0* 6.6  ALBUMIN 3.3* 3.5 2.4* 3.0*   No results for input(s): LIPASE, AMYLASE in the last 168 hours. No results for input(s): AMMONIA in the last 168 hours.  CBC:  Recent Labs Lab 08/26/15 1306  08/28/15 0530 08/28/15 1339 08/29/15 0442 08/30/15 0551 08/31/15 0319  WBC 11.1*  < > 11.4* 11.6* 12.2* 9.8 8.7  NEUTROABS 7.7  --   --  9.9*  --  7.0 7.2  HGB 13.0  < > 8.6* 9.1* 9.5* 10.2* 10.8*  HCT 40.4  < > 26.4* 27.8* 29.4* 31.3* 32.3*  MCV 73.2*  < > 71.2* 71.5* 72.6* 72.5* 70.7*  PLT 291  < > 189 197 225 227 266  < > = values in this interval not displayed.  Cardiac Enzymes:  Recent Labs Lab 08/26/15 1950 08/27/15 0230 08/27/15 0700 08/30/15 2215 08/31/15 0319  TROPONINI 0.31* 0.29* 0.22* 0.05* 0.03    BNP: BNP (last 3 results)  Recent Labs  08/26/15 1307  BNP 451.8*    ProBNP (last 3 results) No results for input(s): PROBNP in the last 8760 hours.   CBG:  Recent Labs Lab 08/28/15 2039 08/28/15 2342 08/29/15 0428 08/29/15 0737 08/29/15 1112  GLUCAP 130* 95 123* 75 112*    Coagulation Studies: No results for input(s): LABPROT, INR in the last 72 hours.  Other results: EKG: Sinus Rhythm 96 bpm PVCs.   Imaging: Dg  Chest Port 1 View  08/31/2015  CLINICAL DATA:  Chest pain beginning after physical therapy yesterday. EXAM: PORTABLE CHEST 1 VIEW COMPARISON:  08/28/2015 FINDINGS: Borderline heart size without vascular congestion. Infiltration in the right lung base obscuring the right hemidiaphragm. Probable small right pleural effusion. Appearance suggest pneumonia. Atelectasis in the left lung base is improved since previous study. No pneumothorax. Calcified and tortuous aorta. Mild thoracic scoliosis convex towards the right. IMPRESSION: Infiltration in the right lung base with probable small right pleural effusion. Findings suggest pneumonia. Electronically Signed   By: Burman Nieves M.D.   On: 08/31/2015 03:21      Medications:     Current Medications: . aspirin EC  325 mg Oral Daily  . atorvastatin  80 mg Oral q1800  . carvedilol  12.5 mg Oral BID WC  . isosorbide-hydrALAZINE  1 tablet Oral TID  . sacubitril-valsartan  1 tablet Oral BID  . sodium chloride  3 mL Intravenous Q12H     Infusions:      Assessment/Plan  Julie Stein was readmitted this morning after syncopal episode requiring brief CPR.   1. Syncope: Occurred while on bedside commode.  Required brief CPR. No meds required. ? Vagal episode associated with micturation versus arrhythmia versus orthostatic. Of note, had SVT requiring amiodarone during her earlier hospital admission.  - Today she is not orthostatic. Sitting 139/79, Standing 130/70  - Would cut back a bit on her cardiac medications, her SBP to the 90s last night => stop Entresto and use losartan, decrease Bidil to 1/2 tab tid.   - With low EF and syncope, would plan Lifevest at discharge.  Will monitor on telemetry for arrhythmia while in the hospital.  2. Chronic Systolic HF: Primarily nonischemic cardiomyopathy. ECHO 10/9 EF 20-25% with LVH.  Volume  status ok. No diuretics.  - Continue Coreg 12.5 mg bid.  - Stop Entresto, start losartan 25 mg daily.  - Decrease Bidil  to 1/2 tab tid.   3. CAD: LHC 10/10 with 20% proximal LAD, 40% mid LAD, 90% distal, large high OM1 with 80% ostial stenosis. Medically managed.  Troponin normal so far this admission.  Had some chest heaviness yesterday, ?in setting of low blood pressure.  - Contine ASA and statin.  4. CVA: MRI head last admission showed small infarcts in watershed region.  She had transient left-sided weakness last admission that pre-dated her cardiac cath, ?related to uncontrolled HTN.  5. H/o SVT: Required amiodarone briefly last admission.  Will need to watch for recurrent arrhythmia given syncope.  Continue Coreg.  6. HTN: Stable today. Watch closely, may need to uptitrate meds carefully again. Arrange for renal artery dopplers.  7. Pulmonary: Persistent right-sided infiltrate on CXR, decreased BS right base.  Does not appear changed from prior.  Normal WBCs, no fever.  Had course of azithromycin.  Will arrange PA/lateral CXR with right lateral decubitus film, suspect pleural effusion.  8. Needs PT/OT.   Length of Stay: 0  Amy Clegg NP_C  08/31/2015, 7:41 AM  Advanced Heart Failure Team Pager [phone] (M-F; 7a - 4p)  Please contact [facility] for night-coverage after hours (4p -7a ) and weekends on [email or web address]  Patient seen with NP, agree with the above note.  Julie Stein had a syncopal episode last night.  ?vagal as she was on the commode at the time versus arrhythmia versus orthostatic.  She is not orthostatic this morning.  - Monitor on telemetry, Lifevest at discharge.   - Cut back on cardiac meds as above and will need to follow BP closely.  - She is not volume overloaded, does not need diuresis.  - PT/OT.  - CXR PA/lateral with right lateral decubitus film.  - Still awaiting doppler US evaluation for renal artery stenosis (have been waiting a while for this, will reorder again).   Marca Ancona 08/31/2015 9:01 AM

## 2015-08-31 NOTE — IPOC Note (Addendum)
Overall Plan of Care [facility]) Patient Details Name: Julie Stein MRN: [id number] DOB: [birth year]  Admitting Diagnosis: Watershed infarcts   Hospital Problems: Principal Problem:   Syncope and collapse Active Problems:   Acute bilat watershed infarction [facility])   Hypertension   Acute on chronic systolic CHF (congestive heart failure) (HCC)   Syncope     Functional Problem List: Nursing Bowel, Endurance, Medication Management, Motor  PT Balance, Perception, Safety, Endurance, Motor  OT Balance, Endurance, Motor, Perception, Safety  SLP    TR         Basic ADL's: OT Grooming, Bathing, Dressing, Toileting     Advanced  ADL's: OT Simple Meal Preparation, Laundry     Transfers: PT Bed Mobility, Bed to Chair, Car, [employer], Civil Service fast streamer, Research scientist (life sciences): PT Stairs, Ambulation, Wheelchair Mobility     Additional Impairments: OT None  SLP        TR      Anticipated Outcomes Item Anticipated Outcome  Self Feeding No goal  Swallowing      Basic self-care  Marketing executive Transfers Supervision  Bowel/Bladder  Mod I  Transfers  mod I  Locomotion  supevision with LRAD  Communication     Cognition     Pain  n/a  Safety/Judgment  Mod I   Therapy Plan: PT Intensity: Minimum of 1-2 x/day ,45 to 90 minutes PT Frequency: 5 out of 7 days PT Duration Estimated Length of Stay: 10-14 days OT Intensity: Minimum of 1-2 x/day, 45 to 90 minutes OT Frequency: 5 out of 7 days OT Duration/Estimated Length of Stay: 10-12 days         Team Interventions: Nursing Interventions Patient/Family Education, Disease Management/Prevention, Bowel Management  PT interventions Ambulation/gait training, Warden/ranger, Community reintegration, Discharge planning, DME/adaptive equipment instruction, Functional mobility training, Neuromuscular re-education, Patient/family education, Psychosocial support, Stair training,  Therapeutic Exercise, UE/LE Coordination activities, Wheelchair propulsion/positioning, UE/LE Strength taining/ROM, Therapeutic Activities, Visual/perceptual remediation/compensation  OT Interventions Warden/ranger, Firefighter, Discharge planning, Disease mangement/prevention, Fish farm manager, Functional mobility training, Neuromuscular re-education, Pain management, Patient/family education, Psychosocial support, Self Care/advanced ADL retraining, Therapeutic Activities, Therapeutic Exercise, UE/LE Strength taining/ROM, UE/LE Coordination activities, Visual/perceptual remediation/compensation  SLP Interventions    TR Interventions    SW/CM Interventions Discharge Planning, Psychosocial Support, Patient/Family Education    Team Discharge Planning: Destination: PT-Home ,OT- Home , SLP-  Projected Follow-up: PT-Outpatient PT, OT-  Home health OT, SLP-  Projected Equipment Needs: PT-To be determined, OT- Tub/shower seat, SLP-  Equipment Details: PT- , OT-  Patient/family involved in discharge planning: PT- Family member/caregiver, Patient,  OT-Patient, Family member/caregiver, SLP-   MD ELOS: 10-14d Medical Rehab Prognosis:  Good Assessment: 80 y.o. right handed female history of hypertension. Patient lives with grandson and his wife large extended supportive family. Independent prior to admission and active. One level home 6 steps to entry. Presented 08/26/2015 with sudden onset of shortness of breath noted recent bouts of diarrhea, congestion and cough. Chest x-ray shows significant pulmonary edema. EKG with frequent PVCs bigeminy 2 runs of SVT heart rate 200 bpm. She became hypotensive placed on amiodarone drip. Bedside echocardiogram with ejection fraction 20-25% diffuse hypokinesis, RV functioning well. Placed on broad-spectrum antibiotic suspect viral illness. Troponin mildly elevated 0.22 days 0.29. Cardiology service is consulted and underwent  cardiac catheterization showing severe distal LAD stenosis moderate ostial large OMI stenosis. Advised medical management. Post catheterization noted left-sided weakness. MRI of the brain showed  small volume patchy cortical and subcortical ischemic infarcts within the left frontal and right frontoparietal region with additional tiny cortical infarcts within the right occipital lobe. Pt admitted to rehab but hopital day #2 had unresponsive episode and was transferred off Rehab unit. Expect pt top resume rehab stay after medical stabilization on acute care  Now requiring 24/7 Rehab RN,MD, as well as CIR level PT, OT and SLP.  Treatment team will focus on ADLs and mobility with goals set at sup/mod I See Team Conference Notes for weekly updates to the plan of care

## 2015-08-31 NOTE — Progress Notes (Signed)
Social Work Discharge Note  The overall goal for the admission was met for:   Discharge location: No - pt had to transfer back to acute hospital due to cardiac issues  Length of Stay: No - transferred on second day here   Discharge activity level: No - not enough time to reach goals  Home/community participation: No - back to acute hospital  Services provided included: MD, RD, PT, OT, RN, Pharmacy and [place]: Medicare  Follow-up services arranged: Other: none at this time due to unexpected transfer back to acute  Comments (or additional information): Pt was only on CIR for a day and a half prior to transferring back to acute hospital due to heart issues.  CSW met with Marlowe Shores, PA and therapy team and they feel pt is appropriate to come back to CIR when she is medically stable.  CSW spoke with Karene Fry, Admissions Coordinator, and she will continue to follow pt on acute for readmission to CIR.  Patient/Family verbalized understanding of follow-up arrangements: Other : no discussion with family at time of transfer  Individual responsible for coordination of the follow-up plan: medical team on acute  Confirmed correct DME delivered: Othon Guardia, Silvestre Mesi 08/31/2015    Olaf Mesa, Silvestre Mesi

## 2015-08-31 NOTE — Progress Notes (Signed)
Called by primary RN to see pt for chest pressure.  EKG performed and NP on call reviewed.  Delayed coming to room immediately due to urgent RR call, but on my arrival pt was feeling back to normal and denied pain or pressure.  Suggested getting labs such as trop & basic chemistry.  VSS.  Will cont. To monitor.

## 2015-08-31 NOTE — Progress Notes (Signed)
Rehab admissions - Patient known to me from previous inpatient rehab admission 08/29/15.  Once patient is medically stable, plan is for re admit to acute inpatient rehab.  Call me for questions.  #[phone]

## 2015-08-31 NOTE — Progress Notes (Signed)
Patient seen and evaluated earlier this AM. Currently undergoing work up. Cardiology on board.  Please refer to H and P for details regarding assessment and plan.  Patient seen and evaluated  Gen: Pt in nad, alert and awake CV: no cyanosis Pulm: no increased wob, equal chest rise  Will reassess next am unless there is acute medical condition requiring further work up  [employer], [employer]

## 2015-08-31 NOTE — Progress Notes (Signed)
Pt arrived to 2C16 from 31M rehab at 0140.  Pt is AOx4, no complaints of pain, VS's are stable, on RA at 98%, family is at the bedside.  RN will continue to monitor.

## 2015-08-31 NOTE — H&P (Signed)
[facility] History and Physical  Solstice Lastinger UXL:[id number] DOB: 06-09-1935 DOA: 08/31/2015  Referring physician: Ms.Pamella. PCP: No PCP Per Patient  Specialists: Dr.Mc.Lean.  Chief Complaint: Loss of consciousness.  HPI: Julie Stein is a 80 y.o. female who was recently admitted for acute pulmonary edema with 2-D echo showing EF of 20-25% and cardiac cath was done and was planned to be managed medically and during hospitalization patient also had left-sided weakness MRI showed stroke and patient was eventually discharged to a rehabilitation 2 days ago. In the rehabilitation yesterday patient started developing some chest pressure which lasted for 1 hour and resolved spontaneously initially but later the chest pressure again developed late in the evening which also resolved. Later when patient was going to the bathroom and urinating patient had a syncopal episode which lasted for less than a minute. CODE BLUE was called and patient was given at least 2-3 at bedtime compression following which patient regained pulse and became more alert and awake. Patient does not recall the incident. Patient is currently chest pain-free and on exam nonfocal. Patient is admitted for syncopal episode. Reviewand is found to be mildly hypotensive early in the day. Presently is normotensive. Patient denies any palpitations or shortness of breath. Patient's repeat chest x-ray and labs are pending. EKG shows normal sinus rhythm with PVCs. Patient during last admission was briefly in SVT and required amiodarone infusion.  Review of Systems: As presented in the history of presenting illness, rest negative.  Past Medical History  Diagnosis Date  . Hypertension   . CHF (congestive heart failure) (HCC)   . Stroke [facility])    Past Surgical History  Procedure Laterality Date  . Cardiac catheterization N/A 08/27/2015    Procedure: Right/Left Heart Cath and Coronary Angiography;  Surgeon: Laurey Morale, MD;  Location:  [facility] INVASIVE CV LAB;  Service: Cardiovascular;  Laterality: N/A;  . Knee surgery     Social History:  reports that she has never smoked. She does not have any smokeless tobacco history on file. She reports that she does not drink alcohol or use illicit drugs. Where does patient live home. Can patient participate in ADLs? Yes.  Allergies  Allergen Reactions  . Demerol [Meperidine]     intolerance    Family History:  Family History  Problem Relation Age of Onset  . Hypertension Daughter       Prior to Admission medications   Medication Sig Start Date End Date Taking? Authorizing Provider  aspirin EC 325 MG EC tablet Take 1 tablet (325 mg total) by mouth daily. 08/29/15   Amy D Filbert Schilder, NP  atorvastatin (LIPITOR) 80 MG tablet Take 1 tablet (80 mg total) by mouth daily at 6 PM. 08/29/15   Amy D Clegg, NP  azithromycin (ZITHROMAX) 250 MG tablet Take 1 tablet (250 mg total) by mouth daily. 08/29/15   Amy D Filbert Schilder, NP  carvedilol (COREG) 12.5 MG tablet Take 1 tablet (12.5 mg total) by mouth 2 (two) times daily with a meal. 08/29/15   Amy D Clegg, NP  COLLAGEN PO Take 1 capsule by mouth daily.    Historical Provider, MD  isosorbide-hydrALAZINE (BIDIL) 20-37.5 MG tablet Take 1 tablet by mouth 3 (three) times daily. 08/29/15   Amy D Filbert Schilder, NP  Multiple Vitamins-Minerals (MULTIVITAMIN) tablet Take 1 tablet by mouth daily.    Historical Provider, MD  sacubitril-valsartan (ENTRESTO) 24-26 MG Take 1 tablet by mouth 2 (two) times daily. 08/29/15   Sherald Hess, NP  Physical Exam: Filed Vitals:   08/31/15 0146 08/31/15 0200 08/31/15 0313  BP: 140/102 117/72 113/62  Pulse: 91 88 95  Temp: 97.8 F (36.6 C)  98 F (36.7 C)  TempSrc: Oral  Oral  Resp: 22 18 22   Height: 4\' 11"  (1.499 m)    SpO2:  97% 96%     General:  Moderately built and nourished.  Eyes: Anicteric no pallor.  ENT: No discharge from the ears eyes nose or mouth.  Neck: No mass felt. No JVD appreciated.  Cardiovascular:  S1 and S2 heard.  Respiratory: No rhonchi or crepitations.  Abdomen: Soft nontender bowel sounds present.  Skin: No rash.  Musculoskeletal: No edema.  Psychiatric: Appears normal.  Neurologic: Alert awake oriented to time place and person. Moves all extremities 5 x 5. No facial asymmetry. Tongue is midline.  Labs on Admission:  Basic Metabolic Panel:  Recent Labs Lab 08/26/15 1950  08/28/15 0530 08/28/15 0930 08/29/15 0442 08/30/15 0551 08/31/15 0319  NA 134*  --  136 139 139 139 140  K 3.6  --  3.6 3.3* 3.7 3.8 3.6  CL 95*  --  102 104 103 104 105  CO2 25  --  27 26 26 25 26   GLUCOSE 196*  --  111* 135* 128* 113* 154*  BUN 19  --  20 17 14 12 15   CREATININE 1.11*  < > 1.15* 1.05* 1.12* 1.04* 1.19*  CALCIUM 9.0  --  8.6* 9.0 9.2 9.4 9.4  MG 1.8  --  1.8  --   --   --  2.1  PHOS 3.9  --  2.6  --   --   --   --   < > = values in this interval not displayed. Liver Function Tests:  Recent Labs Lab 08/26/15 1306 08/26/15 1950 08/30/15 0551 08/31/15 0319  AST 42* 59* 27 33  ALT 21 30 19 24   ALKPHOS 100 94 63 65  BILITOT 0.2* 0.3 0.6 0.7  PROT 7.6 7.5 6.0* 6.6  ALBUMIN 3.3* 3.5 2.4* 3.0*   No results for input(s): LIPASE, AMYLASE in the last 168 hours. No results for input(s): AMMONIA in the last 168 hours. CBC:  Recent Labs Lab 08/26/15 1306  08/28/15 0530 08/28/15 1339 08/29/15 0442 08/30/15 0551 08/31/15 0319  WBC 11.1*  < > 11.4* 11.6* 12.2* 9.8 8.7  NEUTROABS 7.7  --   --  9.9*  --  7.0 7.2  HGB 13.0  < > 8.6* 9.1* 9.5* 10.2* 10.8*  HCT 40.4  < > 26.4* 27.8* 29.4* 31.3* 32.3*  MCV 73.2*  < > 71.2* 71.5* 72.6* 72.5* 70.7*  PLT 291  < > 189 197 225 227 PENDING  < > = values in this interval not displayed. Cardiac Enzymes:  Recent Labs Lab 08/26/15 1950 08/27/15 0230 08/27/15 0700 08/30/15 2215 08/31/15 0319  TROPONINI 0.31* 0.29* 0.22* 0.05* 0.03    BNP (last 3 results)  Recent Labs  08/26/15 1307  BNP 451.8*    ProBNP (last 3  results) No results for input(s): PROBNP in the last 8760 hours.  CBG:  Recent Labs Lab 08/28/15 2039 08/28/15 2342 08/29/15 0428 08/29/15 0737 08/29/15 1112  GLUCAP 130* 95 123* 75 112*    Radiological Exams on Admission: Dg Chest Port 1 View  08/31/2015  CLINICAL DATA:  Chest pain beginning after physical therapy yesterday. EXAM: PORTABLE CHEST 1 VIEW COMPARISON:  08/28/2015 FINDINGS: Borderline heart size without vascular congestion. Infiltration in the right lung  base obscuring the right hemidiaphragm. Probable small right pleural effusion. Appearance suggest pneumonia. Atelectasis in the left lung base is improved since previous study. No pneumothorax. Calcified and tortuous aorta. Mild thoracic scoliosis convex towards the right. IMPRESSION: Infiltration in the right lung base with probable small right pleural effusion. Findings suggest pneumonia. Electronically Signed   By: Burman Nieves M.D.   On: 08/31/2015 03:21    EKG: Independently reviewed. Sinus rhythm with PVCs and LVH.  Assessment/Plan Active Problems:   Hypertension   Syncope   Chronic systolic heart failure (HCC)   Microcytic hypochromic anemia   Chest pain   1. Syncope - patient had chest compression given in the rehabilitation following which patient regained consciousness. Given the history of EF of 20-25% primary concerning for arrhythmia. Patient initially yesterday was also was found to be mildly hypotensive. At this time we will closely monitor and stepdown. I have requested cardiology consult. Cycle cardiac markers. 2. Chest pain - patient had chest pain yesterday twice. Presley chest pain-free. Has had recent cardiac. We will cycle cardiac markers continue aspirin and beta blockers and statins. 3. Chronic systolic heart failure last EF measured last week was 20-25% - appears compensated. Chest x-ray is pending. 4. Hypertension - continue present medications. See #1. 5. Macrocytic hypochromic anemia -  follow CBC will need further workup as outpatient.  Patient's repeat labs and chest x-ray are pending. I have reviewed patient's old charts on labs.   DVT Prophylaxis SCDs for now since patient had chest compressions. Code Status: Full code.  Family Communication: Patient's daughters.  Disposition Plan: Admit for observation.    Khristin Keleher N. [facility] Pager [phone].  If 7PM-7AM, please contact night-coverage [email or web address] Password [email or web address] 08/31/2015, 3:59 AM

## 2015-08-31 NOTE — Significant Event (Signed)
Rapid Response Event Note Called by primary RN to see pt for "no pulse" Overview: Time Called: 0025 Arrival Time: 0027 Event Type: Cardiac  Initial Focused Assessment: Per report pt was on the [name] and became unresponsive and slumped over.  Nurses were unable to palpate a pulse and performed several chest compressions. On my arrival to the room the pt had a strong pulse and had her eyes open.  She quickly became more alert & was able to answer questions.  BP 123/71 HR 82 sats 100.  Pt does not know what happened.  She denies any pain or pressure at this time and states she feels OK. Pt had an event earlier in the evening with chest pressure that resolved within a few minutes. Labs were drawn and EKG done at that time.  [name], on call provider, was given those results by the primary RN.   Discussed the situation with on call [name]) who was unable to come see pt or perform discharge.  She deferred to [name] PA who I discussed the case with.  Pt to be tx to SDU and seen by [facility].  Interventions: EKG Admit to inpatient status  Event Summary: Name of Physician Notified: Phoebe Sharps at 0981  Name of Consulting Physician Notified: Elita Quick PA at 0032  Outcome: Transferred (Comment)    2C16  Julie Stein

## 2015-08-31 NOTE — Progress Notes (Signed)
At 2130 pt started to complain of "heaviness and discomfort" to her chest. No complaints of 'chest pain' or sharp pains. BP 143/74 HR 79 O2 99%. Notified [name], NP with finding. Orders to get EKG and troponin lab value. RR called and assessed pt and reviewed EKG. Lissa Merlin, NP notified with EKG results. 2245 pt stated "I feel much better. That heaviness went away." Notified Maisie Fus, NP with results and new order for nitro 0.4mg  PRN for chest pain.  Staff transferred pt to [facility] at 0020. Pt daughter urgently called staff to room at 0025. RN and NT arrived with pt unresponsive and pulseless. Pt transferred back to bed and Chest Compressions were initiated. A Code was called at 0027. Upon arrival of RR and team pt had strong pulse and responding to commands. Pt had vasovagal episode. Pt alert and oriented x4, stable vitals. BP 123/71 HR 82 O2 100%. Notified Maisie Fus, NP at [employer] with code. Pt VS stable and no complaints of pain Rn called Maisie Fus, NP with pt status and recommendations for pt to be transferred. Orders placed per Marissa Nestle, PA. Pt transferred to 2C with family at bedside.

## 2015-08-31 NOTE — Discharge Summary (Signed)
Physician Discharge Summary  Patient ID: Julie Stein MRN: [id number] DOB/AGE: December 31, 1934 80 y.o.  Admit date: 08/29/2015 Discharge date: 08/31/2015  Discharge Diagnoses:  Principal Problem:   Syncope and collapse Active Problems:   Acute bilat watershed infarction [facility])   Hypertension   Acute on chronic systolic CHF (congestive heart failure) (HCC)   Syncope   Discharged Condition: Guarded but stable  Significant Diagnostic Studies: Ct Head Wo Contrast  08/27/2015  CLINICAL DATA:  Initial evaluation for acute unresponsiveness. , left-sided weakness, now resolved. EXAM: CT HEAD WITHOUT CONTRAST TECHNIQUE: Contiguous axial images were obtained from the base of the skull through the vertex without intravenous contrast. COMPARISON:  None. FINDINGS: Age-related cerebral volume loss present. Patchy and confluent hypodensity within the periventricular and deep white matter both cerebral hemispheres most consistent with chronic small vessel ischemic disease. Scattered vascular calcifications within the carotid siphons and distal right vertebral artery. No acute large vessel territory infarct. Gray-white matter differentiation grossly maintained. Deep gray nuclei are symmetric. No acute intracranial hemorrhage. No mass lesion, midline shift, or mass effect. No hydrocephalus. No extra-axial fluid collection. Scalp soft tissues within normal limits. No acute abnormality about the orbits. Paranasal sinuses mastoid air cells are clear. Calvarium intact. IMPRESSION: 1. No acute intracranial process. 2. Age-related cerebral atrophy with chronic small vessel ischemic disease. Electronically Signed   By: Rise Mu M.D.   On: 08/27/2015 02:31   [name] Wo Contrast  08/28/2015  CLINICAL DATA:  Initial evaluation for transient left-sided weakness following an episode of unresponsiveness and hypotension. EXAM: MRI HEAD WITHOUT CONTRAST MRA HEAD WITHOUT CONTRAST TECHNIQUE: Multiplanar, multiecho  pulse sequences of the brain and surrounding structures were obtained without intravenous contrast. Angiographic images of the head were obtained using MRA technique without contrast. COMPARISON:  Prior CT from 08/27/2015. FINDINGS: MRI HEAD FINDINGS Diffuse prominence of the CSF containing spaces is compatible with generalized age-related cerebral atrophy. Patchy and confluent T2/FLAIR hyperintensity within the periventricular and deep white matter both cerebral hemispheres most consistent with chronic small vessel ischemic disease. Small remote lacunar infarct within the periventricular white matter of the left corona radiata. There is a few small foci of patchy restricted diffusion within the high left frontal lobe and right frontoparietal regions bilaterally (series 4, image 33, 35). Additional tiny punctate cortical infarct more inferiorly within the right occipital lobe (series 4, image 26). Associated signal loss seen on corresponding ADC map. No associated hemorrhage or mass effect. Bodies consistent with small acute ischemic infarcts. Given the distribution of these findings, underlying watershed etiology is favored. Normal intravascular flow voids are maintained. Additional scattered subcentimeter foci of susceptibility artifact seen in within the peripheral cortices of the bilateral cerebral hemispheres on gradient echo sequence, consistent with small chronic micro hemorrhages. While these could potentially be related underlying hypertension, possible amyloid angiopathy could be considered given their somewhat peripheral distribution. No mass lesion, midline shift, or mass effect. No hydrocephalus. No extra-axial fluid collection. Craniocervical junction within normal limits. Mild degenerative spondylolysis noted within the visualized upper cervical spine. Pituitary gland normal. Thinning of the anterior body of the corpus callosum noted, which may related to remote ischemia. No acute abnormality about  the orbits. Mild right-sided exophthalmos. Paranasal sinuses are clear. No mastoid effusion. Inner ear structures within normal limits. Bone marrow signal intensity within normal limits. No scalp soft tissue abnormality. MRA HEAD FINDINGS ANTERIOR CIRCULATION: Visualized distal cervical segments of the internal carotid arteries are patent with antegrade flow. Petrous segments widely patent. Scattered multi  focal atheromatous irregularity within the cavernous and supraclinoid left ICA. There is a short-segment moderate stenosis at the proximal supraclinoid left ICA (series 5, image 72). Cavernous and supraclinoid right ICA widely patent. Right A1 segment widely patent. Left A1 segment slightly hypoplastic. Anterior communicating artery patent. Atheromatous irregularity within the anterior cerebral arteries bilaterally. M1 segments widely patent without stenosis or occlusion. Atheromatous irregularity within the left M1 segment. MCA bifurcations within normal limits. MCA branches symmetric bilaterally. Distal small vessel disease present within the MCA branches bilaterally. POSTERIOR CIRCULATION: Vertebral arteries patent to the vertebrobasilar junction. Right vertebral artery slightly diminutive. Posterior inferior cerebral arteries not well evaluated on this exam. Basilar artery widely patent. Small left anterior inferior cerebral artery noted. Superior cerebellar arteries patent. Both sub posterior cerebral arteries arise from the basilar artery and are well opacified to their distal aspects. Distal small vessel disease within the PCA branches bilaterally. There is a more focal short-segment moderate stenosis within the proximal right P2 segment (series 505, image 15). No aneurysm or vascular malformation. IMPRESSION: MRI HEAD IMPRESSION: 1. Small volume patchy cortical and subcortical ischemic infarcts within the left frontal and right frontoparietal region, with additional tiny cortical infarct within the right  occipital lobe. Given the distribution of these infarcts, an underlying watershed etiology is favored. No associated hemorrhage or mass effect. 2. Scattered small chronic micro hemorrhages as above. While these might be related to chronic underlying hypertension, possible amyloid angiopathy could also be considered given their somewhat peripheral distribution. 3. Atrophy with chronic microvascular ischemic disease. MRA HEAD IMPRESSION: 1. No large vessel or proximal arterial branch occlusion identified. 2. Short-segment moderate stenosis within the supraclinoid left ICA. 3. Short-segment moderate stenosis within the proximal right P2 segment. 4. Distal small vessel atheromatous disease within the MCA, ACA, and PCA branches bilaterally Electronically Signed   By: Rise Mu M.D.   On: 08/28/2015 02:26   Mr Brain Wo Contrast  08/28/2015  CLINICAL DATA:  Initial evaluation for transient left-sided weakness following an episode of unresponsiveness and hypotension. EXAM: MRI HEAD WITHOUT CONTRAST MRA HEAD WITHOUT CONTRAST TECHNIQUE: Multiplanar, multiecho pulse sequences of the brain and surrounding structures were obtained without intravenous contrast. Angiographic images of the head were obtained using MRA technique without contrast. COMPARISON:  Prior CT from 08/27/2015. FINDINGS: MRI HEAD FINDINGS Diffuse prominence of the CSF containing spaces is compatible with generalized age-related cerebral atrophy. Patchy and confluent T2/FLAIR hyperintensity within the periventricular and deep white matter both cerebral hemispheres most consistent with chronic small vessel ischemic disease. Small remote lacunar infarct within the periventricular white matter of the left corona radiata. There is a few small foci of patchy restricted diffusion within the high left frontal lobe and right frontoparietal regions bilaterally (series 4, image 33, 35). Additional tiny punctate cortical infarct more inferiorly within the  right occipital lobe (series 4, image 26). Associated signal loss seen on corresponding ADC map. No associated hemorrhage or mass effect. Bodies consistent with small acute ischemic infarcts. Given the distribution of these findings, underlying watershed etiology is favored. Normal intravascular flow voids are maintained. Additional scattered subcentimeter foci of susceptibility artifact seen in within the peripheral cortices of the bilateral cerebral hemispheres on gradient echo sequence, consistent with small chronic micro hemorrhages. While these could potentially be related underlying hypertension, possible amyloid angiopathy could be considered given their somewhat peripheral distribution. No mass lesion, midline shift, or mass effect. No hydrocephalus. No extra-axial fluid collection. Craniocervical junction within normal limits. Mild degenerative spondylolysis noted within the  visualized upper cervical spine. Pituitary gland normal. Thinning of the anterior body of the corpus callosum noted, which may related to remote ischemia. No acute abnormality about the orbits. Mild right-sided exophthalmos. Paranasal sinuses are clear. No mastoid effusion. Inner ear structures within normal limits. Bone marrow signal intensity within normal limits. No scalp soft tissue abnormality. MRA HEAD FINDINGS ANTERIOR CIRCULATION: Visualized distal cervical segments of the internal carotid arteries are patent with antegrade flow. Petrous segments widely patent. Scattered multi focal atheromatous irregularity within the cavernous and supraclinoid left ICA. There is a short-segment moderate stenosis at the proximal supraclinoid left ICA (series 5, image 72). Cavernous and supraclinoid right ICA widely patent. Right A1 segment widely patent. Left A1 segment slightly hypoplastic. Anterior communicating artery patent. Atheromatous irregularity within the anterior cerebral arteries bilaterally. M1 segments widely patent without  stenosis or occlusion. Atheromatous irregularity within the left M1 segment. MCA bifurcations within normal limits. MCA branches symmetric bilaterally. Distal small vessel disease present within the MCA branches bilaterally. POSTERIOR CIRCULATION: Vertebral arteries patent to the vertebrobasilar junction. Right vertebral artery slightly diminutive. Posterior inferior cerebral arteries not well evaluated on this exam. Basilar artery widely patent. Small left anterior inferior cerebral artery noted. Superior cerebellar arteries patent. Both sub posterior cerebral arteries arise from the basilar artery and are well opacified to their distal aspects. Distal small vessel disease within the PCA branches bilaterally. There is a more focal short-segment moderate stenosis within the proximal right P2 segment (series 505, image 15). No aneurysm or vascular malformation. IMPRESSION: MRI HEAD IMPRESSION: 1. Small volume patchy cortical and subcortical ischemic infarcts within the left frontal and right frontoparietal region, with additional tiny cortical infarct within the right occipital lobe. Given the distribution of these infarcts, an underlying watershed etiology is favored. No associated hemorrhage or mass effect. 2. Scattered small chronic micro hemorrhages as above. While these might be related to chronic underlying hypertension, possible amyloid angiopathy could also be considered given their somewhat peripheral distribution. 3. Atrophy with chronic microvascular ischemic disease. MRA HEAD IMPRESSION: 1. No large vessel or proximal arterial branch occlusion identified. 2. Short-segment moderate stenosis within the supraclinoid left ICA. 3. Short-segment moderate stenosis within the proximal right P2 segment. 4. Distal small vessel atheromatous disease within the MCA, ACA, and PCA branches bilaterally Electronically Signed   By: Rise Mu M.D.   On: 08/28/2015 02:26   Dg Chest Port 1 View  08/28/2015   CLINICAL DATA:  Shortness of breath.  Pulmonary edema. EXAM: PORTABLE CHEST 1 VIEW COMPARISON:  08/26/2015. FINDINGS: Left IJ line in stable position. Cardiomegaly with bilateral pulmonary alveolar infiltrates. Slight improvement. Persistent small pleural effusions. No pneumothorax. IMPRESSION: 1. Left IJ line in stable position. 2. Cardiomegaly with persistent bilateral pulmonary infiltrates, partial clearing from prior exam. Persistent small pleural effusions. Electronically Signed   By: Maisie Fus  Register   On: 08/28/2015 07:18   Dg Chest Port 1 View  08/26/2015  CLINICAL DATA:  80 year old female central line placement. Initial encounter. EXAM: PORTABLE CHEST 1 VIEW COMPARISON:  1306 hours today. FINDINGS: Portable AP semi upright view at 2259 hours. Left IJ central line has been placed. Tip is at the lower SVC level. No pneumothorax. Stable cardiac size and mediastinal contours. Interval mild regression of widespread patchy in perihilar opacity, improvement is greater in the upper lobes. Residual in the lower lobes. No large pleural effusion. IMPRESSION: 1. Left IJ central line placed, tip at the lower SVC level with no adverse features. 2. Some interval regression of  pulmonary edema, residual in the lower lungs. Electronically Signed   By: Odessa Fleming M.D.   On: 08/26/2015 23:06   Dg Chest Portable 1 View  08/26/2015  CLINICAL DATA:  Shortness of breath. EXAM: PORTABLE CHEST 1 VIEW COMPARISON:  None. FINDINGS: Borderline cardiomegaly is noted. No pneumothorax or significant pleural effusion is noted. Diffuse patchy airspace opacities are noted throughout both lungs concerning for pneumonia or edema. Bony thorax is unremarkable. IMPRESSION: Diffuse patchy bilateral airspace opacities are noted concerning for pneumonia or edema. Electronically Signed   By: Lupita Raider, M.D.   On: 08/26/2015 13:24    Labs:  Basic Metabolic Panel:  Recent Labs Lab 08/26/15 1306 08/26/15 1328 08/26/15 1950  08/27/15 1725 08/28/15 0530 08/28/15 0930 08/29/15 0442 08/30/15 0551  NA 135 139 134*  --  136 139 139 139  K 3.5 3.5 3.6  --  3.6 3.3* 3.7 3.8  CL 100* 104 95*  --  102 104 103 104  CO2 23  --  25  --  27 26 26 25   GLUCOSE 366* 371* 196*  --  111* 135* 128* 113*  BUN 16 21* 19  --  20 17 14 12   CREATININE 1.14* 1.10* 1.11* 1.35* 1.15* 1.05* 1.12* 1.04*  CALCIUM 9.2  --  9.0  --  8.6* 9.0 9.2 9.4  MG  --   --  1.8  --  1.8  --   --   --   PHOS  --   --  3.9  --  2.6  --   --   --    CBC:  Recent Labs Lab 08/26/15 1306  08/28/15 1339 08/29/15 0442 08/30/15 0551  WBC 11.1*  < > 11.6* 12.2* 9.8  NEUTROABS 7.7  --  9.9*  --  7.0  HGB 13.0  < > 9.1* 9.5* 10.2*  HCT 40.4  < > 27.8* 29.4* 31.3*  MCV 73.2*  < > 71.5* 72.6* 72.5*  PLT 291  < > 197 225 227  < > = values in this interval not displayed.  CBG:  Recent Labs Lab 08/28/15 2039 08/28/15 2342 08/29/15 0428 08/29/15 0737 08/29/15 1112  GLUCAP 130* 95 123* 75 112*    Brief HPI:   Julie Stein is a 80 year old female with history of HTN who was admitted on 10/9/016 with acute onset of SOB as well as recent bout of cough and congestion. Sh was hypotensive, with significant pulmonary edema due to CHF and EKG revealed SVT with HR 200. She was placed on amiodarone drip and placed on antibiotics due to concerns of infection.  2D echo showed diffuse hypokinesis with EF 20-25% and she underwent cardiac cath revealing severe distal LAD and moderate ostial OM1 stenosis.  Medical treatment recommended per cardiology.  She was found to have left sided weakness post cath and MRI brain showed small cortical and subcortical infarcts in left frontal and right frontoparietal region as well as tiny cortical infarcts in right occipital lobe.  Neurology recommended ASA for secondary stroke prevention. PT evaluation done 10/12 and CIR was recommended for follow up therapy   Hospital Course: Keniya Schlotterbeck was admitted to rehab 08/29/2015 for  inpatient therapies to consist of PT, ST and OT at least three hours five days a week.  Follow up labs done on 10/13 am showed improvement in renal insufficiency and leucocytosis had resolved. Renal artery dopplers showed no evidence of stenosis.  She tolerated OT evaluation in am but was  noted to be hypotensive in the afternoon with SBP  95. Cardiology recommended continuing current dose of Bidil, Coreg and Entresto as patient asymptomatic.    Later in evening patient reported chest pressure but vitals reported to stable. EKG showed no acute changes and cardiac enzymes show improvement. Chest pressure did resolve but she had a syncopal episode while urinating in the bathroom.  Nurse reported lack of pulse and chest compressions initiated briefly with return of pulse and responsiveness. She was evaluated by Rapid response nurse and was back to baseline. Dr. Fredrich Romans was consulted to transfer patient to acute services for closer monitoring and she was discharged to 2 central   Disposition: Acute floor  Diet: Cardiac diet.   Current Medications:  . aspirin EC  325 mg Oral Daily  . atorvastatin  80 mg Oral q1800  . azithromycin  250 mg Oral Q24H  . carvedilol  12.5 mg Oral BID WC  . chlorhexidine  15 mL Mouth Rinse BID  . Influenza vac split quadrivalent PF  0.5 mL Intramuscular Tomorrow-1000  . isosorbide-hydrALAZINE  1 tablet Oral TID  . pneumococcal 23 valent vaccine  0.5 mL Intramuscular Tomorrow-1000  . sacubitril-valsartan  1 tablet Oral BID       Signed: Jacquelynn Cree 08/31/2015, 1:16 AM

## 2015-09-01 LAB — BASIC METABOLIC PANEL
ANION GAP: 8 (ref 5–15)
BUN: 16 mg/dL (ref 6–20)
CALCIUM: 9 mg/dL (ref 8.9–10.3)
CO2: 24 mmol/L (ref 22–32)
Chloride: 108 mmol/L (ref 101–111)
Creatinine, Ser: 1.11 mg/dL — ABNORMAL HIGH (ref 0.44–1.00)
GFR calc non Af Amer: 46 mL/min — ABNORMAL LOW (ref >60)
GFR, EST AFRICAN AMERICAN: 54 mL/min — AB (ref >60)
Glucose, Bld: 111 mg/dL — ABNORMAL HIGH (ref 65–99)
POTASSIUM: 3.8 mmol/L (ref 3.5–5.1)
SODIUM: 140 mmol/L (ref 135–145)

## 2015-09-01 MED ORDER — HEPARIN SODIUM (PORCINE) 5000 UNIT/ML IJ SOLN
5000.0000 [IU] | Freq: Three times a day (TID) | INTRAMUSCULAR | Status: DC
  Administered 2015-09-03 – 2015-09-05 (×5): 5000 [IU] via SUBCUTANEOUS
  Filled 2015-09-01 (×5): qty 1

## 2015-09-01 MED ORDER — CARVEDILOL 12.5 MG PO TABS
12.5000 mg | ORAL_TABLET | Freq: Two times a day (BID) | ORAL | Status: DC
  Administered 2015-09-02 – 2015-09-06 (×9): 12.5 mg via ORAL
  Filled 2015-09-01 (×9): qty 1

## 2015-09-01 NOTE — Progress Notes (Signed)
Called 3 east to report to nurse for 3e18.  Sec told me RN would call back for report.

## 2015-09-01 NOTE — Progress Notes (Signed)
Patient ID: Julie Stein, female   DOB: December 18, 1934, 80 y.o.   MRN: [id number]   SUBJECTIVE: No problems overnight.  BP stable, no low episodes on lower doses of medications. No dyspnea.  Some chest soreness where she had CPR.   Scheduled Meds: . aspirin EC  325 mg Oral Daily  . atorvastatin  80 mg Oral q1800  . carvedilol  12.5 mg Oral BID WC  . heparin subcutaneous  5,000 Units Subcutaneous 3 times per day  . isosorbide-hydrALAZINE  0.5 tablet Oral TID  . losartan  25 mg Oral Daily  . sodium chloride  3 mL Intravenous Q12H   Continuous Infusions:  PRN Meds:.acetaminophen **OR** acetaminophen, nitroGLYCERIN, ondansetron **OR** ondansetron (ZOFRAN) IV    Filed Vitals:   09/01/15 0000 09/01/15 0316 09/01/15 0619 09/01/15 0850  BP: 123/70 127/80 111/58 116/85  Pulse: 94 94  76  Temp:  98 F (36.7 C)    TempSrc:  Oral    Resp: 20 17  22   Height:      SpO2:  98%      Intake/Output Summary (Last 24 hours) at 09/01/15 0859 Last data filed at 09/01/15 0734  Gross per 24 hour  Intake    600 ml  Output    100 ml  Net    500 ml    LABS: Basic Metabolic Panel:  Recent Labs  40/98/11 0319 09/01/15 0313  NA 140 140  K 3.6 3.8  CL 105 108  CO2 26 24  GLUCOSE 154* 111*  BUN 15 16  CREATININE 1.19* 1.11*  CALCIUM 9.4 9.0  MG 2.1  --    Liver Function Tests:  Recent Labs  08/30/15 0551 08/31/15 0319  AST 27 33  ALT 19 24  ALKPHOS 63 65  BILITOT 0.6 0.7  PROT 6.0* 6.6  ALBUMIN 2.4* 3.0*   No results for input(s): LIPASE, AMYLASE in the last 72 hours. CBC:  Recent Labs  08/30/15 0551 08/31/15 0319  WBC 9.8 8.7  NEUTROABS 7.0 7.2  HGB 10.2* 10.8*  HCT 31.3* 32.3*  MCV 72.5* 70.7*  PLT 227 266   Cardiac Enzymes:  Recent Labs  08/31/15 0319 08/31/15 1015 08/31/15 1509  TROPONINI 0.03 <0.03 0.03   BNP: Invalid input(s): POCBNP D-Dimer: No results for input(s): DDIMER in the last 72 hours. Hemoglobin A1C: No results for input(s): HGBA1C in the last  72 hours. Fasting Lipid Panel: No results for input(s): CHOL, HDL, LDLCALC, TRIG, CHOLHDL, LDLDIRECT in the last 72 hours. Thyroid Function Tests: No results for input(s): TSH, T4TOTAL, T3FREE, THYROIDAB in the last 72 hours.  Invalid input(s): FREET3 Anemia Panel: No results for input(s): VITAMINB12, FOLATE, FERRITIN, TIBC, IRON, RETICCTPCT in the last 72 hours.  RADIOLOGY: Dg Chest 2 View  08/31/2015  CLINICAL DATA:  Dyspnea. EXAM: CHEST  2 VIEW COMPARISON:  August 31, 2015. FINDINGS: Stable cardiomediastinal silhouette. No pneumothorax is noted. Stable right basilar opacity is noted concerning for pneumonia or atelectasis with associated pleural effusion. Mild left basilar subsegmental atelectasis is noted. Bony thorax is unremarkable. IMPRESSION: Stable right basilar opacity is noted concerning for pneumonia or atelectasis with associated pleural effusion. Mild left basilar subsegmental atelectasis is noted as well. Electronically Signed   By: Lupita Raider, M.D.   On: 08/31/2015 11:00   Ct Head Wo Contrast  08/27/2015  CLINICAL DATA:  Initial evaluation for acute unresponsiveness. , left-sided weakness, now resolved. EXAM: CT HEAD WITHOUT CONTRAST TECHNIQUE: Contiguous axial images were obtained from the base of  the skull through the vertex without intravenous contrast. COMPARISON:  None. FINDINGS: Age-related cerebral volume loss present. Patchy and confluent hypodensity within the periventricular and deep white matter both cerebral hemispheres most consistent with chronic small vessel ischemic disease. Scattered vascular calcifications within the carotid siphons and distal right vertebral artery. No acute large vessel territory infarct. Gray-white matter differentiation grossly maintained. Deep gray nuclei are symmetric. No acute intracranial hemorrhage. No mass lesion, midline shift, or mass effect. No hydrocephalus. No extra-axial fluid collection. Scalp soft tissues within normal  limits. No acute abnormality about the orbits. Paranasal sinuses mastoid air cells are clear. Calvarium intact. IMPRESSION: 1. No acute intracranial process. 2. Age-related cerebral atrophy with chronic small vessel ischemic disease. Electronically Signed   By: Rise Mu M.D.   On: 08/27/2015 02:31   [name] Wo Contrast  08/28/2015  CLINICAL DATA:  Initial evaluation for transient left-sided weakness following an episode of unresponsiveness and hypotension. EXAM: MRI HEAD WITHOUT CONTRAST MRA HEAD WITHOUT CONTRAST TECHNIQUE: Multiplanar, multiecho pulse sequences of the brain and surrounding structures were obtained without intravenous contrast. Angiographic images of the head were obtained using MRA technique without contrast. COMPARISON:  Prior CT from 08/27/2015. FINDINGS: MRI HEAD FINDINGS Diffuse prominence of the CSF containing spaces is compatible with generalized age-related cerebral atrophy. Patchy and confluent T2/FLAIR hyperintensity within the periventricular and deep white matter both cerebral hemispheres most consistent with chronic small vessel ischemic disease. Small remote lacunar infarct within the periventricular white matter of the left corona radiata. There is a few small foci of patchy restricted diffusion within the high left frontal lobe and right frontoparietal regions bilaterally (series 4, image 33, 35). Additional tiny punctate cortical infarct more inferiorly within the right occipital lobe (series 4, image 26). Associated signal loss seen on corresponding ADC map. No associated hemorrhage or mass effect. Bodies consistent with small acute ischemic infarcts. Given the distribution of these findings, underlying watershed etiology is favored. Normal intravascular flow voids are maintained. Additional scattered subcentimeter foci of susceptibility artifact seen in within the peripheral cortices of the bilateral cerebral hemispheres on gradient echo sequence, consistent  with small chronic micro hemorrhages. While these could potentially be related underlying hypertension, possible amyloid angiopathy could be considered given their somewhat peripheral distribution. No mass lesion, midline shift, or mass effect. No hydrocephalus. No extra-axial fluid collection. Craniocervical junction within normal limits. Mild degenerative spondylolysis noted within the visualized upper cervical spine. Pituitary gland normal. Thinning of the anterior body of the corpus callosum noted, which may related to remote ischemia. No acute abnormality about the orbits. Mild right-sided exophthalmos. Paranasal sinuses are clear. No mastoid effusion. Inner ear structures within normal limits. Bone marrow signal intensity within normal limits. No scalp soft tissue abnormality. MRA HEAD FINDINGS ANTERIOR CIRCULATION: Visualized distal cervical segments of the internal carotid arteries are patent with antegrade flow. Petrous segments widely patent. Scattered multi focal atheromatous irregularity within the cavernous and supraclinoid left ICA. There is a short-segment moderate stenosis at the proximal supraclinoid left ICA (series 5, image 72). Cavernous and supraclinoid right ICA widely patent. Right A1 segment widely patent. Left A1 segment slightly hypoplastic. Anterior communicating artery patent. Atheromatous irregularity within the anterior cerebral arteries bilaterally. M1 segments widely patent without stenosis or occlusion. Atheromatous irregularity within the left M1 segment. MCA bifurcations within normal limits. MCA branches symmetric bilaterally. Distal small vessel disease present within the MCA branches bilaterally. POSTERIOR CIRCULATION: Vertebral arteries patent to the vertebrobasilar junction. Right vertebral artery slightly diminutive. Posterior  inferior cerebral arteries not well evaluated on this exam. Basilar artery widely patent. Small left anterior inferior cerebral artery noted. Superior  cerebellar arteries patent. Both sub posterior cerebral arteries arise from the basilar artery and are well opacified to their distal aspects. Distal small vessel disease within the PCA branches bilaterally. There is a more focal short-segment moderate stenosis within the proximal right P2 segment (series 505, image 15). No aneurysm or vascular malformation. IMPRESSION: MRI HEAD IMPRESSION: 1. Small volume patchy cortical and subcortical ischemic infarcts within the left frontal and right frontoparietal region, with additional tiny cortical infarct within the right occipital lobe. Given the distribution of these infarcts, an underlying watershed etiology is favored. No associated hemorrhage or mass effect. 2. Scattered small chronic micro hemorrhages as above. While these might be related to chronic underlying hypertension, possible amyloid angiopathy could also be considered given their somewhat peripheral distribution. 3. Atrophy with chronic microvascular ischemic disease. MRA HEAD IMPRESSION: 1. No large vessel or proximal arterial branch occlusion identified. 2. Short-segment moderate stenosis within the supraclinoid left ICA. 3. Short-segment moderate stenosis within the proximal right P2 segment. 4. Distal small vessel atheromatous disease within the MCA, ACA, and PCA branches bilaterally Electronically Signed   By: Rise Mu M.D.   On: 08/28/2015 02:26   Mr Brain Wo Contrast  08/28/2015  CLINICAL DATA:  Initial evaluation for transient left-sided weakness following an episode of unresponsiveness and hypotension. EXAM: MRI HEAD WITHOUT CONTRAST MRA HEAD WITHOUT CONTRAST TECHNIQUE: Multiplanar, multiecho pulse sequences of the brain and surrounding structures were obtained without intravenous contrast. Angiographic images of the head were obtained using MRA technique without contrast. COMPARISON:  Prior CT from 08/27/2015. FINDINGS: MRI HEAD FINDINGS Diffuse prominence of the CSF containing  spaces is compatible with generalized age-related cerebral atrophy. Patchy and confluent T2/FLAIR hyperintensity within the periventricular and deep white matter both cerebral hemispheres most consistent with chronic small vessel ischemic disease. Small remote lacunar infarct within the periventricular white matter of the left corona radiata. There is a few small foci of patchy restricted diffusion within the high left frontal lobe and right frontoparietal regions bilaterally (series 4, image 33, 35). Additional tiny punctate cortical infarct more inferiorly within the right occipital lobe (series 4, image 26). Associated signal loss seen on corresponding ADC map. No associated hemorrhage or mass effect. Bodies consistent with small acute ischemic infarcts. Given the distribution of these findings, underlying watershed etiology is favored. Normal intravascular flow voids are maintained. Additional scattered subcentimeter foci of susceptibility artifact seen in within the peripheral cortices of the bilateral cerebral hemispheres on gradient echo sequence, consistent with small chronic micro hemorrhages. While these could potentially be related underlying hypertension, possible amyloid angiopathy could be considered given their somewhat peripheral distribution. No mass lesion, midline shift, or mass effect. No hydrocephalus. No extra-axial fluid collection. Craniocervical junction within normal limits. Mild degenerative spondylolysis noted within the visualized upper cervical spine. Pituitary gland normal. Thinning of the anterior body of the corpus callosum noted, which may related to remote ischemia. No acute abnormality about the orbits. Mild right-sided exophthalmos. Paranasal sinuses are clear. No mastoid effusion. Inner ear structures within normal limits. Bone marrow signal intensity within normal limits. No scalp soft tissue abnormality. MRA HEAD FINDINGS ANTERIOR CIRCULATION: Visualized distal cervical  segments of the internal carotid arteries are patent with antegrade flow. Petrous segments widely patent. Scattered multi focal atheromatous irregularity within the cavernous and supraclinoid left ICA. There is a short-segment moderate stenosis at the proximal supraclinoid left  ICA (series 5, image 72). Cavernous and supraclinoid right ICA widely patent. Right A1 segment widely patent. Left A1 segment slightly hypoplastic. Anterior communicating artery patent. Atheromatous irregularity within the anterior cerebral arteries bilaterally. M1 segments widely patent without stenosis or occlusion. Atheromatous irregularity within the left M1 segment. MCA bifurcations within normal limits. MCA branches symmetric bilaterally. Distal small vessel disease present within the MCA branches bilaterally. POSTERIOR CIRCULATION: Vertebral arteries patent to the vertebrobasilar junction. Right vertebral artery slightly diminutive. Posterior inferior cerebral arteries not well evaluated on this exam. Basilar artery widely patent. Small left anterior inferior cerebral artery noted. Superior cerebellar arteries patent. Both sub posterior cerebral arteries arise from the basilar artery and are well opacified to their distal aspects. Distal small vessel disease within the PCA branches bilaterally. There is a more focal short-segment moderate stenosis within the proximal right P2 segment (series 505, image 15). No aneurysm or vascular malformation. IMPRESSION: MRI HEAD IMPRESSION: 1. Small volume patchy cortical and subcortical ischemic infarcts within the left frontal and right frontoparietal region, with additional tiny cortical infarct within the right occipital lobe. Given the distribution of these infarcts, an underlying watershed etiology is favored. No associated hemorrhage or mass effect. 2. Scattered small chronic micro hemorrhages as above. While these might be related to chronic underlying hypertension, possible amyloid  angiopathy could also be considered given their somewhat peripheral distribution. 3. Atrophy with chronic microvascular ischemic disease. MRA HEAD IMPRESSION: 1. No large vessel or proximal arterial branch occlusion identified. 2. Short-segment moderate stenosis within the supraclinoid left ICA. 3. Short-segment moderate stenosis within the proximal right P2 segment. 4. Distal small vessel atheromatous disease within the MCA, ACA, and PCA branches bilaterally Electronically Signed   By: Rise Mu M.D.   On: 08/28/2015 02:26   Dg Chest Right Decubitus  08/31/2015  CLINICAL DATA:  Dyspnea. EXAM: CHEST - RIGHT DECUBITUS COMPARISON:  Same day. FINDINGS: Right lateral decubitus view of the chest demonstrates mild free flowing right pleural effusion. IMPRESSION: Mild free flowing right pleural effusion. Electronically Signed   By: Lupita Raider, M.D.   On: 08/31/2015 11:27   Dg Chest Port 1 View  08/31/2015  CLINICAL DATA:  Chest pain beginning after physical therapy yesterday. EXAM: PORTABLE CHEST 1 VIEW COMPARISON:  08/28/2015 FINDINGS: Borderline heart size without vascular congestion. Infiltration in the right lung base obscuring the right hemidiaphragm. Probable small right pleural effusion. Appearance suggest pneumonia. Atelectasis in the left lung base is improved since previous study. No pneumothorax. Calcified and tortuous aorta. Mild thoracic scoliosis convex towards the right. IMPRESSION: Infiltration in the right lung base with probable small right pleural effusion. Findings suggest pneumonia. Electronically Signed   By: Burman Nieves M.D.   On: 08/31/2015 03:21   Dg Chest Port 1 View  08/28/2015  CLINICAL DATA:  Shortness of breath.  Pulmonary edema. EXAM: PORTABLE CHEST 1 VIEW COMPARISON:  08/26/2015. FINDINGS: Left IJ line in stable position. Cardiomegaly with bilateral pulmonary alveolar infiltrates. Slight improvement. Persistent small pleural effusions. No pneumothorax.  IMPRESSION: 1. Left IJ line in stable position. 2. Cardiomegaly with persistent bilateral pulmonary infiltrates, partial clearing from prior exam. Persistent small pleural effusions. Electronically Signed   By: Maisie Fus  Register   On: 08/28/2015 07:18   Dg Chest Port 1 View  08/26/2015  CLINICAL DATA:  80 year old female central line placement. Initial encounter. EXAM: PORTABLE CHEST 1 VIEW COMPARISON:  1306 hours today. FINDINGS: Portable AP semi upright view at 2259 hours. Left IJ central line has been  placed. Tip is at the lower SVC level. No pneumothorax. Stable cardiac size and mediastinal contours. Interval mild regression of widespread patchy in perihilar opacity, improvement is greater in the upper lobes. Residual in the lower lobes. No large pleural effusion. IMPRESSION: 1. Left IJ central line placed, tip at the lower SVC level with no adverse features. 2. Some interval regression of pulmonary edema, residual in the lower lungs. Electronically Signed   By: Odessa Fleming M.D.   On: 08/26/2015 23:06   Dg Chest Portable 1 View  08/26/2015  CLINICAL DATA:  Shortness of breath. EXAM: PORTABLE CHEST 1 VIEW COMPARISON:  None. FINDINGS: Borderline cardiomegaly is noted. No pneumothorax or significant pleural effusion is noted. Diffuse patchy airspace opacities are noted throughout both lungs concerning for pneumonia or edema. Bony thorax is unremarkable. IMPRESSION: Diffuse patchy bilateral airspace opacities are noted concerning for pneumonia or edema. Electronically Signed   By: Lupita Raider, M.D.   On: 08/26/2015 13:24    PHYSICAL EXAM General: NAD Neck: No JVD, no thyromegaly or thyroid nodule.  Lungs: Clear to auscultation bilaterally with normal respiratory effort. CV: Nondisplaced PMI.  Heart regular S1/S2 with paradoxical S2 split, no S3/S4, no murmur.  No peripheral edema.   Abdomen: Soft, nontender, no hepatosplenomegaly, no distention.  Neurologic: Alert and oriented x 3.  Psych: Normal  affect. Extremities: No clubbing or cyanosis.   TELEMETRY: Reviewed telemetry pt in NSR with PVCs  ASSESSMENT AND PLAN: Julie Stein was readmitted 10/14 after syncopal episode requiring brief CPR.  1. Syncope: Occurred while on bedside commode. Required brief CPR. No meds required. ? Vagal episode associated with micturation versus arrhythmia versus orthostatic. Of note, had SVT requiring amiodarone during her earlier hospital admission. No significant arrhythmias so far on telemetry (just PVCs).  - She has not been orthostatic when measured. - We cut back on her cardiac meds, BP has been stable with this change.  - With low EF and syncope, would plan Lifevest at discharge. Will monitor on telemetry for arrhythmia while in the hospital.  2. Chronic Systolic HF: Primarily nonischemic cardiomyopathy. ECHO 10/9 EF 20-25% with LVH. Volume status ok. No diuretics.  - Continue Coreg 12.5 mg bid.  - Continue current doses of losartan and Bidil. - Add low dose spironolactone tomorrow if BP remains stable.    3. CAD: LHC 10/10 with 20% proximal LAD, 40% mid LAD, 90% distal, large high OM1 with 80% ostial stenosis. Medically managed. Troponin normal this admission.  Current chest soreness from CPR.  - Contine ASA and statin.  4. CVA: MRI head last admission showed small infarcts in watershed region. She had transient left-sided weakness last admission that pre-dated her cardiac cath, ?related to uncontrolled HTN.  5. H/o SVT: Required amiodarone briefly last admission. Will need to watch for recurrent arrhythmia given syncope => none so far. Continue Coreg.  6. HTN: Stable today. Watch closely, may need to uptitrate meds carefully again. Renal artery dopplers did not show RAS.  7. Pulmonary: Persistent right-sided infiltrate on CXR, decreased BS right base. Does not appear changed from prior. Normal WBCs, no fever. Had course of azithromycin. ?atelectasis with small effusion.   8. Needs  PT/OT.  9. Think she can go to telemetry. Probably ready to go back to CIR soon.   Julie Stein 09/01/2015 9:04 AM

## 2015-09-01 NOTE — Progress Notes (Signed)
[facility] PROGRESS NOTE  Tyshae Stair RUE:[id number] DOB: 01-18-1935 DOA: 08/31/2015 PCP: No PCP Per Patient  Assessment/Plan: Active Problems:     Syncope - etiology uncertain. Vasovagal vs arrythmia. Telemetry has been negative for arrythmias. Will monitor for another 24 hours on telemetry. May have been vasovagal as syncopal episode happened while she was going to the bathroom.  Hypertension - Pt on carvedilol, bidil, losartan and blood pressures relatively well controlled on this regimen.    Chronic systolic heart failure [facility]) - Cardiology on board. Continue medication above listed under htn    Microcytic hypochromic anemia - stable, no active bleeding    Chest pain - From chest compressions. Patient reports improvement in condition - continue supportive therapy  Code Status: full Family Communication: None at bedside Disposition Plan: Pending improvement in condition   Consultants:  Cardiology  Procedures:  None  Antibiotics:  None  HPI/Subjective: Pt has no new complaints. States that her chest pain is improving.  Objective: Filed Vitals:   09/01/15 0850  BP: 116/85  Pulse: 76  Temp: 97.7 F (36.5 C)  Resp: 22    Intake/Output Summary (Last 24 hours) at 09/01/15 1140 Last data filed at 09/01/15 0734  Gross per 24 hour  Intake    360 ml  Output    100 ml  Net    260 ml   There were no vitals filed for this visit.  Exam:   General:  Pt in nad,alert and awake  Cardiovascular: rrr, no mrg  Respiratory: cta bl, no wheezes  Abdomen: soft, nd, nt  Musculoskeletal: no cyanosis or clubbing   Data Reviewed: Basic Metabolic Panel:  Recent Labs Lab 08/26/15 1950  08/28/15 0530 08/28/15 0930 08/29/15 0442 08/30/15 0551 08/31/15 0319 09/01/15 0313  NA 134*  --  136 139 139 139 140 140  K 3.6  --  3.6 3.3* 3.7 3.8 3.6 3.8  CL 95*  --  102 104 103 104 105 108  CO2 25  --  GLUCOSE 196*  --  111* 135* 128* 113*  154* 111*  BUN 19  --  CREATININE 1.11*  < > 1.15* 1.05* 1.12* 1.04* 1.19* 1.11*  CALCIUM 9.0  --  8.6* 9.0 9.2 9.4 9.4 9.0  MG 1.8  --  1.8  --   --   --  2.1  --   PHOS 3.9  --  2.6  --   --   --   --   --   < > = values in this interval not displayed. Liver Function Tests:  Recent Labs Lab 08/26/15 1306 08/26/15 1950 08/30/15 0551 08/31/15 0319  AST 42* 59* 27 33  ALT ALKPHOS 100 94 63 65  BILITOT 0.2* 0.3 0.6 0.7  PROT 7.6 7.5 6.0* 6.6  ALBUMIN 3.3* 3.5 2.4* 3.0*   No results for input(s): LIPASE, AMYLASE in the last 168 hours. No results for input(s): AMMONIA in the last 168 hours. CBC:  Recent Labs Lab 08/26/15 1306  08/28/15 0530 08/28/15 1339 08/29/15 0442 08/30/15 0551 08/31/15 0319  WBC 11.1*  < > 11.4* 11.6* 12.2* 9.8 8.7  NEUTROABS 7.7  --   --  9.9*  --  7.0 7.2  HGB 13.0  < > 8.6* 9.1* 9.5* 10.2* 10.8*  HCT 40.4  < > 26.4* 27.8* 29.4* 31.3* 32.3*  MCV 73.2*  < > 71.2* 71.5* 72.6*  72.5* 70.7*  PLT 291  < > 189 197 225 227 266  < > = values in this interval not displayed. Cardiac Enzymes:  Recent Labs Lab 08/27/15 0700 08/30/15 2215 08/31/15 0319 08/31/15 1015 08/31/15 1509  TROPONINI 0.22* 0.05* 0.03 <0.03 0.03   BNP (last 3 results)  Recent Labs  08/26/15 1307  BNP 451.8*    ProBNP (last 3 results) No results for input(s): PROBNP in the last 8760 hours.  CBG:  Recent Labs Lab 08/28/15 2039 08/28/15 2342 08/29/15 0428 08/29/15 0737 08/29/15 1112  GLUCAP 130* 95 123* 75 112*    Recent Results (from the past 240 hour(s))  MRSA PCR Screening     Status: None   Collection Time: 08/27/15  6:08 AM  Result Value Ref Range Status   MRSA by PCR NEGATIVE NEGATIVE Final    Comment:        The GeneXpert MRSA Assay (FDA approved for NASAL specimens only), is one component of a comprehensive MRSA colonization surveillance program. It is not intended to diagnose MRSA infection nor to guide or monitor  treatment for MRSA infections.   Respiratory virus panel     Status: None   Collection Time: 08/27/15 12:38 PM  Result Value Ref Range Status   Source - RVPAN NASAL SWAB  Corrected   Respiratory Syncytial Virus A Negative Negative Final   Respiratory Syncytial Virus B Negative Negative Final   Influenza A Negative Negative Final   Influenza B Negative Negative Final   Parainfluenza 1 Negative Negative Final   Parainfluenza 2 Negative Negative Final   Parainfluenza 3 Negative Negative Final   Metapneumovirus Negative Negative Final   Rhinovirus Negative Negative Final   Adenovirus Negative Negative Final    Comment: (NOTE) Performed At: [facility] [address] [place], [place] [id number] Mila Homer MD RC:[id number]      Studies: Dg Chest 2 View  08/31/2015  CLINICAL DATA:  Dyspnea. EXAM: CHEST  2 VIEW COMPARISON:  August 31, 2015. FINDINGS: Stable cardiomediastinal silhouette. No pneumothorax is noted. Stable right basilar opacity is noted concerning for pneumonia or atelectasis with associated pleural effusion. Mild left basilar subsegmental atelectasis is noted. Bony thorax is unremarkable. IMPRESSION: Stable right basilar opacity is noted concerning for pneumonia or atelectasis with associated pleural effusion. Mild left basilar subsegmental atelectasis is noted as well. Electronically Signed   By: Lupita Raider, M.D.   On: 08/31/2015 11:00   Dg Chest Right Decubitus  08/31/2015  CLINICAL DATA:  Dyspnea. EXAM: CHEST - RIGHT DECUBITUS COMPARISON:  Same day. FINDINGS: Right lateral decubitus view of the chest demonstrates mild free flowing right pleural effusion. IMPRESSION: Mild free flowing right pleural effusion. Electronically Signed   By: Lupita Raider, M.D.   On: 08/31/2015 11:27   Dg Chest Port 1 View  08/31/2015  CLINICAL DATA:  Chest pain beginning after physical therapy yesterday. EXAM: PORTABLE CHEST 1 VIEW COMPARISON:  08/28/2015 FINDINGS:  Borderline heart size without vascular congestion. Infiltration in the right lung base obscuring the right hemidiaphragm. Probable small right pleural effusion. Appearance suggest pneumonia. Atelectasis in the left lung base is improved since previous study. No pneumothorax. Calcified and tortuous aorta. Mild thoracic scoliosis convex towards the right. IMPRESSION: Infiltration in the right lung base with probable small right pleural effusion. Findings suggest pneumonia. Electronically Signed   By: Burman Nieves M.D.   On: 08/31/2015 03:21    Scheduled Meds: . aspirin EC  325 mg Oral Daily  .  atorvastatin  80 mg Oral q1800  . carvedilol  12.5 mg Oral BID WC  . heparin subcutaneous  5,000 Units Subcutaneous 3 times per day  . isosorbide-hydrALAZINE  0.5 tablet Oral TID  . losartan  25 mg Oral Daily  . sodium chloride  3 mL Intravenous Q12H   Continuous Infusions:    Time spent: > 35 minutes   Penny Pia  [facility] Pager [phone] If 7PM-7AM, please contact night-coverage at [email or web address], password [facility] 09/01/2015, 11:40 AM  LOS: 1 day

## 2015-09-02 DIAGNOSIS — I10 Essential (primary) hypertension: Secondary | ICD-10-CM

## 2015-09-02 LAB — BASIC METABOLIC PANEL
ANION GAP: 9 (ref 5–15)
BUN: 15 mg/dL (ref 6–20)
CHLORIDE: 102 mmol/L (ref 101–111)
CO2: 26 mmol/L (ref 22–32)
CREATININE: 1.05 mg/dL — AB (ref 0.44–1.00)
Calcium: 8.9 mg/dL (ref 8.9–10.3)
GFR calc non Af Amer: 50 mL/min — ABNORMAL LOW (ref >60)
GFR, EST AFRICAN AMERICAN: 57 mL/min — AB (ref >60)
Glucose, Bld: 111 mg/dL — ABNORMAL HIGH (ref 65–99)
POTASSIUM: 3.8 mmol/L (ref 3.5–5.1)
Sodium: 137 mmol/L (ref 135–145)

## 2015-09-02 LAB — CBC
HCT: 30 % — ABNORMAL LOW (ref 36.0–46.0)
Hemoglobin: 9.6 g/dL — ABNORMAL LOW (ref 12.0–15.0)
MCH: 23 pg — ABNORMAL LOW (ref 26.0–34.0)
MCHC: 32 g/dL (ref 30.0–36.0)
MCV: 71.8 fL — ABNORMAL LOW (ref 78.0–100.0)
PLATELETS: 246 10*3/uL (ref 150–400)
RBC: 4.18 MIL/uL (ref 3.87–5.11)
RDW: 15.8 % — AB (ref 11.5–15.5)
WBC: 7.9 10*3/uL (ref 4.0–10.5)

## 2015-09-02 MED ORDER — LOSARTAN POTASSIUM 25 MG PO TABS: 25.0000 mg | ORAL_TABLET | Freq: Every day | ORAL | Status: DC

## 2015-09-02 MED ORDER — METOPROLOL TARTRATE 1 MG/ML IV SOLN
2.5000 mg | INTRAVENOUS | Status: AC | PRN
  Administered 2015-09-02 (×2): 2.5 mg via INTRAVENOUS
  Filled 2015-09-02 (×2): qty 5

## 2015-09-02 MED ORDER — DILTIAZEM HCL 100 MG IV SOLR
INTRAVENOUS | Status: AC
  Filled 2015-09-02: qty 100

## 2015-09-02 MED ORDER — SPIRONOLACTONE 25 MG PO TABS: 12.5000 mg | ORAL_TABLET | Freq: Every day | ORAL | Status: DC

## 2015-09-02 MED ORDER — SPIRONOLACTONE 25 MG PO TABS
12.5000 mg | ORAL_TABLET | Freq: Every day | ORAL | Status: DC
  Administered 2015-09-02 – 2015-09-03 (×2): 12.5 mg via ORAL
  Filled 2015-09-02 (×2): qty 1

## 2015-09-02 MED ORDER — DILTIAZEM HCL 100 MG IV SOLR
5.0000 mg/h | INTRAVENOUS | Status: DC
  Administered 2015-09-02: 5 mg/h via INTRAVENOUS

## 2015-09-02 NOTE — Progress Notes (Signed)
HR 160`s after 2 doses of metoprolol per order.PA made aware

## 2015-09-02 NOTE — Progress Notes (Signed)
Patient stated that heparin is pork derived and declined on religious ground-MD made aware.SCD ordered.

## 2015-09-02 NOTE — Progress Notes (Signed)
Called by Tresa Endo with [employer], has to wait for tomorrow before office check with Insurance prior to fitting. Dr. Cena Benton made aware  Signed, Azalee Course PA Pager: [phone]

## 2015-09-02 NOTE — Progress Notes (Signed)
Patient with increased HR 150-180`s EKG done  PA made aware and came to see patient. Meds given per order.

## 2015-09-02 NOTE — Discharge Summary (Signed)
Physician Discharge Summary  Lucetta Baehr ZOX:[id number] DOB: May 25, 1935 DOA: 08/31/2015  PCP: No PCP Per Patient  Admit date: 08/31/2015 Discharge date: 09/02/2015  Time spent: > 35 minutes   1. Patient will need telemetry monitoring at CIR or life vest please see last cardiology recommendations.  Discharge Diagnoses:  Active Problems:   Hypertension   Syncope   Chronic systolic heart failure (HCC)   Microcytic hypochromic anemia   Chest pain   Discharge Condition: stable  Diet recommendation: heart healthy  Filed Weights   09/02/15 0541  Weight: 49.9 kg (110 lb 0.2 oz)    History of present illness:  From original HPI: 80 y/o with h/o chronic systolic HF who presented to the hospital after syncopal episode which occurred after micturition with brief chest compressions.  Hospital Course:  Syncope - etiology uncertain. Vasovagal vs arrythmia. Telemetry has been negative for arrythmias. May have been vasovagal as syncopal episode happened while she was going to the bathroom. - Cardiology recommending life vest on d/c  Hypertension - Pt on carvedilol, bidil, losartan , spironolactone and blood pressures relatively well controlled on this regimen.   Chronic systolic heart failure [facility]) - Cardiology on board. Continue medication above listed under htn   Microcytic hypochromic anemia - stable, no active bleeding   Chest pain - From chest compressions. Patient reports improvement in condition - continue supportive therapy  Chest x ray reported findings suspicious for pna but patient clinically does not have any symptoms consistent with this. WBC wnl and patient afebrile  Procedures:  None   Consultations:  Cardiology  Discharge Exam: Filed Vitals:   09/02/15 1000  BP: 150/88  Pulse: 97  Temp:   Resp:     General: Pt in nad, alert and awake Cardiovascular: rrr, no rubs Respiratory: no increased wob no wheezes  Discharge Instructions   Discharge  Instructions    Call MD for:  difficulty breathing, headache or visual disturbances    Complete by:  As directed      Call MD for:  temperature >100.4    Complete by:  As directed      Diet - low sodium heart healthy    Complete by:  As directed      Discharge instructions    Complete by:  As directed   Pt will need telemetry at CIR or life vest per cardiology recommendations.     Increase activity slowly    Complete by:  As directed           Current Discharge Medication List    START taking these medications   Details  losartan (COZAAR) 25 MG tablet Take 1 tablet (25 mg total) by mouth daily. Qty: 30 tablet, Refills: 0    spironolactone (ALDACTONE) 25 MG tablet Take 0.5 tablets (12.5 mg total) by mouth daily. Qty: 30 tablet, Refills: 0      CONTINUE these medications which have NOT CHANGED   Details  aspirin EC 325 MG EC tablet Take 1 tablet (325 mg total) by mouth daily. Qty: 30 tablet, Refills: 6    atorvastatin (LIPITOR) 80 MG tablet Take 1 tablet (80 mg total) by mouth daily at 6 PM. Qty: 30 tablet, Refills: 6    carvedilol (COREG) 12.5 MG tablet Take 1 tablet (12.5 mg total) by mouth 2 (two) times daily with a meal. Qty: 60 tablet, Refills: 6    isosorbide-hydrALAZINE (BIDIL) 20-37.5 MG tablet Take 1 tablet by mouth 3 (three) times daily. Qty: 90 tablet, Refills: 6  Multiple Vitamins-Minerals (MULTIVITAMIN) tablet Take 1 tablet by mouth daily.      STOP taking these medications     azithromycin (ZITHROMAX) 250 MG tablet      COLLAGEN PO      sacubitril-valsartan (ENTRESTO) 24-26 MG        Allergies  Allergen Reactions  . Demerol [Meperidine]     intolerance      The results of significant diagnostics from this hospitalization (including imaging, microbiology, ancillary and laboratory) are listed below for reference.    Significant Diagnostic Studies: Dg Chest 2 View  08/31/2015  CLINICAL DATA:  Dyspnea. EXAM: CHEST  2 VIEW COMPARISON:   August 31, 2015. FINDINGS: Stable cardiomediastinal silhouette. No pneumothorax is noted. Stable right basilar opacity is noted concerning for pneumonia or atelectasis with associated pleural effusion. Mild left basilar subsegmental atelectasis is noted. Bony thorax is unremarkable. IMPRESSION: Stable right basilar opacity is noted concerning for pneumonia or atelectasis with associated pleural effusion. Mild left basilar subsegmental atelectasis is noted as well. Electronically Signed   By: Lupita Raider, M.D.   On: 08/31/2015 11:00   Ct Head Wo Contrast  08/27/2015  CLINICAL DATA:  Initial evaluation for acute unresponsiveness. , left-sided weakness, now resolved. EXAM: CT HEAD WITHOUT CONTRAST TECHNIQUE: Contiguous axial images were obtained from the base of the skull through the vertex without intravenous contrast. COMPARISON:  None. FINDINGS: Age-related cerebral volume loss present. Patchy and confluent hypodensity within the periventricular and deep white matter both cerebral hemispheres most consistent with chronic small vessel ischemic disease. Scattered vascular calcifications within the carotid siphons and distal right vertebral artery. No acute large vessel territory infarct. Gray-white matter differentiation grossly maintained. Deep gray nuclei are symmetric. No acute intracranial hemorrhage. No mass lesion, midline shift, or mass effect. No hydrocephalus. No extra-axial fluid collection. Scalp soft tissues within normal limits. No acute abnormality about the orbits. Paranasal sinuses mastoid air cells are clear. Calvarium intact. IMPRESSION: 1. No acute intracranial process. 2. Age-related cerebral atrophy with chronic small vessel ischemic disease. Electronically Signed   By: Rise Mu M.D.   On: 08/27/2015 02:31   [name] Wo Contrast  08/28/2015  CLINICAL DATA:  Initial evaluation for transient left-sided weakness following an episode of unresponsiveness and hypotension.  EXAM: MRI HEAD WITHOUT CONTRAST MRA HEAD WITHOUT CONTRAST TECHNIQUE: Multiplanar, multiecho pulse sequences of the brain and surrounding structures were obtained without intravenous contrast. Angiographic images of the head were obtained using MRA technique without contrast. COMPARISON:  Prior CT from 08/27/2015. FINDINGS: MRI HEAD FINDINGS Diffuse prominence of the CSF containing spaces is compatible with generalized age-related cerebral atrophy. Patchy and confluent T2/FLAIR hyperintensity within the periventricular and deep white matter both cerebral hemispheres most consistent with chronic small vessel ischemic disease. Small remote lacunar infarct within the periventricular white matter of the left corona radiata. There is a few small foci of patchy restricted diffusion within the high left frontal lobe and right frontoparietal regions bilaterally (series 4, image 33, 35). Additional tiny punctate cortical infarct more inferiorly within the right occipital lobe (series 4, image 26). Associated signal loss seen on corresponding ADC map. No associated hemorrhage or mass effect. Bodies consistent with small acute ischemic infarcts. Given the distribution of these findings, underlying watershed etiology is favored. Normal intravascular flow voids are maintained. Additional scattered subcentimeter foci of susceptibility artifact seen in within the peripheral cortices of the bilateral cerebral hemispheres on gradient echo sequence, consistent with small chronic micro hemorrhages. While these could potentially  be related underlying hypertension, possible amyloid angiopathy could be considered given their somewhat peripheral distribution. No mass lesion, midline shift, or mass effect. No hydrocephalus. No extra-axial fluid collection. Craniocervical junction within normal limits. Mild degenerative spondylolysis noted within the visualized upper cervical spine. Pituitary gland normal. Thinning of the anterior body of  the corpus callosum noted, which may related to remote ischemia. No acute abnormality about the orbits. Mild right-sided exophthalmos. Paranasal sinuses are clear. No mastoid effusion. Inner ear structures within normal limits. Bone marrow signal intensity within normal limits. No scalp soft tissue abnormality. MRA HEAD FINDINGS ANTERIOR CIRCULATION: Visualized distal cervical segments of the internal carotid arteries are patent with antegrade flow. Petrous segments widely patent. Scattered multi focal atheromatous irregularity within the cavernous and supraclinoid left ICA. There is a short-segment moderate stenosis at the proximal supraclinoid left ICA (series 5, image 72). Cavernous and supraclinoid right ICA widely patent. Right A1 segment widely patent. Left A1 segment slightly hypoplastic. Anterior communicating artery patent. Atheromatous irregularity within the anterior cerebral arteries bilaterally. M1 segments widely patent without stenosis or occlusion. Atheromatous irregularity within the left M1 segment. MCA bifurcations within normal limits. MCA branches symmetric bilaterally. Distal small vessel disease present within the MCA branches bilaterally. POSTERIOR CIRCULATION: Vertebral arteries patent to the vertebrobasilar junction. Right vertebral artery slightly diminutive. Posterior inferior cerebral arteries not well evaluated on this exam. Basilar artery widely patent. Small left anterior inferior cerebral artery noted. Superior cerebellar arteries patent. Both sub posterior cerebral arteries arise from the basilar artery and are well opacified to their distal aspects. Distal small vessel disease within the PCA branches bilaterally. There is a more focal short-segment moderate stenosis within the proximal right P2 segment (series 505, image 15). No aneurysm or vascular malformation. IMPRESSION: MRI HEAD IMPRESSION: 1. Small volume patchy cortical and subcortical ischemic infarcts within the left  frontal and right frontoparietal region, with additional tiny cortical infarct within the right occipital lobe. Given the distribution of these infarcts, an underlying watershed etiology is favored. No associated hemorrhage or mass effect. 2. Scattered small chronic micro hemorrhages as above. While these might be related to chronic underlying hypertension, possible amyloid angiopathy could also be considered given their somewhat peripheral distribution. 3. Atrophy with chronic microvascular ischemic disease. MRA HEAD IMPRESSION: 1. No large vessel or proximal arterial branch occlusion identified. 2. Short-segment moderate stenosis within the supraclinoid left ICA. 3. Short-segment moderate stenosis within the proximal right P2 segment. 4. Distal small vessel atheromatous disease within the MCA, ACA, and PCA branches bilaterally Electronically Signed   By: Rise Mu M.D.   On: 08/28/2015 02:26   Mr Brain Wo Contrast  08/28/2015  CLINICAL DATA:  Initial evaluation for transient left-sided weakness following an episode of unresponsiveness and hypotension. EXAM: MRI HEAD WITHOUT CONTRAST MRA HEAD WITHOUT CONTRAST TECHNIQUE: Multiplanar, multiecho pulse sequences of the brain and surrounding structures were obtained without intravenous contrast. Angiographic images of the head were obtained using MRA technique without contrast. COMPARISON:  Prior CT from 08/27/2015. FINDINGS: MRI HEAD FINDINGS Diffuse prominence of the CSF containing spaces is compatible with generalized age-related cerebral atrophy. Patchy and confluent T2/FLAIR hyperintensity within the periventricular and deep white matter both cerebral hemispheres most consistent with chronic small vessel ischemic disease. Small remote lacunar infarct within the periventricular white matter of the left corona radiata. There is a few small foci of patchy restricted diffusion within the high left frontal lobe and right frontoparietal regions bilaterally  (series 4, image 33, 35). Additional tiny punctate  cortical infarct more inferiorly within the right occipital lobe (series 4, image 26). Associated signal loss seen on corresponding ADC map. No associated hemorrhage or mass effect. Bodies consistent with small acute ischemic infarcts. Given the distribution of these findings, underlying watershed etiology is favored. Normal intravascular flow voids are maintained. Additional scattered subcentimeter foci of susceptibility artifact seen in within the peripheral cortices of the bilateral cerebral hemispheres on gradient echo sequence, consistent with small chronic micro hemorrhages. While these could potentially be related underlying hypertension, possible amyloid angiopathy could be considered given their somewhat peripheral distribution. No mass lesion, midline shift, or mass effect. No hydrocephalus. No extra-axial fluid collection. Craniocervical junction within normal limits. Mild degenerative spondylolysis noted within the visualized upper cervical spine. Pituitary gland normal. Thinning of the anterior body of the corpus callosum noted, which may related to remote ischemia. No acute abnormality about the orbits. Mild right-sided exophthalmos. Paranasal sinuses are clear. No mastoid effusion. Inner ear structures within normal limits. Bone marrow signal intensity within normal limits. No scalp soft tissue abnormality. MRA HEAD FINDINGS ANTERIOR CIRCULATION: Visualized distal cervical segments of the internal carotid arteries are patent with antegrade flow. Petrous segments widely patent. Scattered multi focal atheromatous irregularity within the cavernous and supraclinoid left ICA. There is a short-segment moderate stenosis at the proximal supraclinoid left ICA (series 5, image 72). Cavernous and supraclinoid right ICA widely patent. Right A1 segment widely patent. Left A1 segment slightly hypoplastic. Anterior communicating artery patent. Atheromatous  irregularity within the anterior cerebral arteries bilaterally. M1 segments widely patent without stenosis or occlusion. Atheromatous irregularity within the left M1 segment. MCA bifurcations within normal limits. MCA branches symmetric bilaterally. Distal small vessel disease present within the MCA branches bilaterally. POSTERIOR CIRCULATION: Vertebral arteries patent to the vertebrobasilar junction. Right vertebral artery slightly diminutive. Posterior inferior cerebral arteries not well evaluated on this exam. Basilar artery widely patent. Small left anterior inferior cerebral artery noted. Superior cerebellar arteries patent. Both sub posterior cerebral arteries arise from the basilar artery and are well opacified to their distal aspects. Distal small vessel disease within the PCA branches bilaterally. There is a more focal short-segment moderate stenosis within the proximal right P2 segment (series 505, image 15). No aneurysm or vascular malformation. IMPRESSION: MRI HEAD IMPRESSION: 1. Small volume patchy cortical and subcortical ischemic infarcts within the left frontal and right frontoparietal region, with additional tiny cortical infarct within the right occipital lobe. Given the distribution of these infarcts, an underlying watershed etiology is favored. No associated hemorrhage or mass effect. 2. Scattered small chronic micro hemorrhages as above. While these might be related to chronic underlying hypertension, possible amyloid angiopathy could also be considered given their somewhat peripheral distribution. 3. Atrophy with chronic microvascular ischemic disease. MRA HEAD IMPRESSION: 1. No large vessel or proximal arterial branch occlusion identified. 2. Short-segment moderate stenosis within the supraclinoid left ICA. 3. Short-segment moderate stenosis within the proximal right P2 segment. 4. Distal small vessel atheromatous disease within the MCA, ACA, and PCA branches bilaterally Electronically Signed    By: Rise Mu M.D.   On: 08/28/2015 02:26   Dg Chest Right Decubitus  08/31/2015  CLINICAL DATA:  Dyspnea. EXAM: CHEST - RIGHT DECUBITUS COMPARISON:  Same day. FINDINGS: Right lateral decubitus view of the chest demonstrates mild free flowing right pleural effusion. IMPRESSION: Mild free flowing right pleural effusion. Electronically Signed   By: Lupita Raider, M.D.   On: 08/31/2015 11:27   Dg Chest Port 1 View  08/31/2015  CLINICAL  DATA:  Chest pain beginning after physical therapy yesterday. EXAM: PORTABLE CHEST 1 VIEW COMPARISON:  08/28/2015 FINDINGS: Borderline heart size without vascular congestion. Infiltration in the right lung base obscuring the right hemidiaphragm. Probable small right pleural effusion. Appearance suggest pneumonia. Atelectasis in the left lung base is improved since previous study. No pneumothorax. Calcified and tortuous aorta. Mild thoracic scoliosis convex towards the right. IMPRESSION: Infiltration in the right lung base with probable small right pleural effusion. Findings suggest pneumonia. Electronically Signed   By: Burman Nieves M.D.   On: 08/31/2015 03:21   Dg Chest Port 1 View  08/28/2015  CLINICAL DATA:  Shortness of breath.  Pulmonary edema. EXAM: PORTABLE CHEST 1 VIEW COMPARISON:  08/26/2015. FINDINGS: Left IJ line in stable position. Cardiomegaly with bilateral pulmonary alveolar infiltrates. Slight improvement. Persistent small pleural effusions. No pneumothorax. IMPRESSION: 1. Left IJ line in stable position. 2. Cardiomegaly with persistent bilateral pulmonary infiltrates, partial clearing from prior exam. Persistent small pleural effusions. Electronically Signed   By: Maisie Fus  Register   On: 08/28/2015 07:18   Dg Chest Port 1 View  08/26/2015  CLINICAL DATA:  80 year old female central line placement. Initial encounter. EXAM: PORTABLE CHEST 1 VIEW COMPARISON:  1306 hours today. FINDINGS: Portable AP semi upright view at 2259 hours. Left IJ  central line has been placed. Tip is at the lower SVC level. No pneumothorax. Stable cardiac size and mediastinal contours. Interval mild regression of widespread patchy in perihilar opacity, improvement is greater in the upper lobes. Residual in the lower lobes. No large pleural effusion. IMPRESSION: 1. Left IJ central line placed, tip at the lower SVC level with no adverse features. 2. Some interval regression of pulmonary edema, residual in the lower lungs. Electronically Signed   By: Odessa Fleming M.D.   On: 08/26/2015 23:06   Dg Chest Portable 1 View  08/26/2015  CLINICAL DATA:  Shortness of breath. EXAM: PORTABLE CHEST 1 VIEW COMPARISON:  None. FINDINGS: Borderline cardiomegaly is noted. No pneumothorax or significant pleural effusion is noted. Diffuse patchy airspace opacities are noted throughout both lungs concerning for pneumonia or edema. Bony thorax is unremarkable. IMPRESSION: Diffuse patchy bilateral airspace opacities are noted concerning for pneumonia or edema. Electronically Signed   By: Lupita Raider, M.D.   On: 08/26/2015 13:24    Microbiology: Recent Results (from the past 240 hour(s))  MRSA PCR Screening     Status: None   Collection Time: 08/27/15  6:08 AM  Result Value Ref Range Status   MRSA by PCR NEGATIVE NEGATIVE Final    Comment:        The GeneXpert MRSA Assay (FDA approved for NASAL specimens only), is one component of a comprehensive MRSA colonization surveillance program. It is not intended to diagnose MRSA infection nor to guide or monitor treatment for MRSA infections.   Respiratory virus panel     Status: None   Collection Time: 08/27/15 12:38 PM  Result Value Ref Range Status   Source - RVPAN NASAL SWAB  Corrected   Respiratory Syncytial Virus A Negative Negative Final   Respiratory Syncytial Virus B Negative Negative Final   Influenza A Negative Negative Final   Influenza B Negative Negative Final   Parainfluenza 1 Negative Negative Final    Parainfluenza 2 Negative Negative Final   Parainfluenza 3 Negative Negative Final   Metapneumovirus Negative Negative Final   Rhinovirus Negative Negative Final   Adenovirus Negative Negative Final    Comment: (NOTE) Performed At: [facility]  [address] [place], [place] [id number] Mila Homer MD WJ:[id number]      Labs: Basic Metabolic Panel:  Recent Labs Lab 08/26/15 1950  08/28/15 0530  08/29/15 0442 08/30/15 0551 08/31/15 0319 09/01/15 0313 09/02/15 0402  NA 134*  --  136  < > 139 139 140 140 137  K 3.6  --  3.6  < > 3.7 3.8 3.6 3.8 3.8  CL 95*  --  102  < > 103 104 105 108 102  CO2 25  --  27  < > 26 25 26 24 26   GLUCOSE 196*  --  111*  < > 128* 113* 154* 111* 111*  BUN 19  --  20  < > 14 12 15 16 15   CREATININE 1.11*  < > 1.15*  < > 1.12* 1.04* 1.19* 1.11* 1.05*  CALCIUM 9.0  --  8.6*  < > 9.2 9.4 9.4 9.0 8.9  MG 1.8  --  1.8  --   --   --  2.1  --   --   PHOS 3.9  --  2.6  --   --   --   --   --   --   < > = values in this interval not displayed. Liver Function Tests:  Recent Labs Lab 08/26/15 1306 08/26/15 1950 08/30/15 0551 08/31/15 0319  AST 42* 59* 27 33  ALT 21 30 19 24   ALKPHOS 100 94 63 65  BILITOT 0.2* 0.3 0.6 0.7  PROT 7.6 7.5 6.0* 6.6  ALBUMIN 3.3* 3.5 2.4* 3.0*   No results for input(s): LIPASE, AMYLASE in the last 168 hours. No results for input(s): AMMONIA in the last 168 hours. CBC:  Recent Labs Lab 08/26/15 1306  08/28/15 1339 08/29/15 0442 08/30/15 0551 08/31/15 0319 09/02/15 0402  WBC 11.1*  < > 11.6* 12.2* 9.8 8.7 7.9  NEUTROABS 7.7  --  9.9*  --  7.0 7.2  --   HGB 13.0  < > 9.1* 9.5* 10.2* 10.8* 9.6*  HCT 40.4  < > 27.8* 29.4* 31.3* 32.3* 30.0*  MCV 73.2*  < > 71.5* 72.6* 72.5* 70.7* 71.8*  PLT 291  < > 197 225 227 266 246  < > = values in this interval not displayed. Cardiac Enzymes:  Recent Labs Lab 08/27/15 0700 08/30/15 2215 08/31/15 0319 08/31/15 1015 08/31/15 1509  TROPONINI 0.22* 0.05* 0.03  <0.03 0.03   BNP: BNP (last 3 results)  Recent Labs  08/26/15 1307  BNP 451.8*    ProBNP (last 3 results) No results for input(s): PROBNP in the last 8760 hours.  CBG:  Recent Labs Lab 08/28/15 2039 08/28/15 2342 08/29/15 0428 08/29/15 0737 08/29/15 1112  GLUCAP 130* 95 123* 75 112*    Signed:  Penny Pia  [facility] 09/02/2015, 12:38 PM

## 2015-09-02 NOTE — Progress Notes (Signed)
Patient ID: Julie Stein, female   DOB: 1935/10/03, 80 y.o.   MRN: [id number]   SUBJECTIVE: No problems overnight.  BP normal to high, no low episodes on lower doses of medications. No dyspnea.  Some chest soreness where she had CPR.   Scheduled Meds: . aspirin EC  325 mg Oral Daily  . atorvastatin  80 mg Oral q1800  . carvedilol  12.5 mg Oral BID WC  . heparin subcutaneous  5,000 Units Subcutaneous 3 times per day  . isosorbide-hydrALAZINE  0.5 tablet Oral TID  . losartan  25 mg Oral Daily  . sodium chloride  3 mL Intravenous Q12H  . spironolactone  12.5 mg Oral Daily   Continuous Infusions:  PRN Meds:.acetaminophen **OR** acetaminophen, nitroGLYCERIN, ondansetron **OR** ondansetron (ZOFRAN) IV    Filed Vitals:   09/01/15 1500 09/01/15 1711 09/01/15 1900 09/02/15 0541  BP: 144/73 153/111 146/81 136/87  Pulse: 84 96 80 81  Temp: 98.5 F (36.9 C)  98.1 F (36.7 C) 98 F (36.7 C)  TempSrc: Oral  Oral Oral  Resp: 20  18 18   Height:      Weight:    110 lb 0.2 oz (49.9 kg)  SpO2: 100%  100% 99%    Intake/Output Summary (Last 24 hours) at 09/02/15 0904 Last data filed at 09/01/15 2200  Gross per 24 hour  Intake    360 ml  Output    200 ml  Net    160 ml    LABS: Basic Metabolic Panel:  Recent Labs  62/95/28 0319 09/01/15 0313 09/02/15 0402  NA 140 140 137  K 3.6 3.8 3.8  CL 105 108 102  CO2 26 24 26   GLUCOSE 154* 111* 111*  BUN 15 16 15   CREATININE 1.19* 1.11* 1.05*  CALCIUM 9.4 9.0 8.9  MG 2.1  --   --    Liver Function Tests:  Recent Labs  08/31/15 0319  AST 33  ALT 24  ALKPHOS 65  BILITOT 0.7  PROT 6.6  ALBUMIN 3.0*   No results for input(s): LIPASE, AMYLASE in the last 72 hours. CBC:  Recent Labs  08/31/15 0319 09/02/15 0402  WBC 8.7 7.9  NEUTROABS 7.2  --   HGB 10.8* 9.6*  HCT 32.3* 30.0*  MCV 70.7* 71.8*  PLT 266 246   Cardiac Enzymes:  Recent Labs  08/31/15 0319 08/31/15 1015 08/31/15 1509  TROPONINI 0.03 <0.03 0.03    BNP: Invalid input(s): POCBNP D-Dimer: No results for input(s): DDIMER in the last 72 hours. Hemoglobin A1C: No results for input(s): HGBA1C in the last 72 hours. Fasting Lipid Panel: No results for input(s): CHOL, HDL, LDLCALC, TRIG, CHOLHDL, LDLDIRECT in the last 72 hours. Thyroid Function Tests: No results for input(s): TSH, T4TOTAL, T3FREE, THYROIDAB in the last 72 hours.  Invalid input(s): FREET3 Anemia Panel: No results for input(s): VITAMINB12, FOLATE, FERRITIN, TIBC, IRON, RETICCTPCT in the last 72 hours.  RADIOLOGY: Dg Chest 2 View  08/31/2015  CLINICAL DATA:  Dyspnea. EXAM: CHEST  2 VIEW COMPARISON:  August 31, 2015. FINDINGS: Stable cardiomediastinal silhouette. No pneumothorax is noted. Stable right basilar opacity is noted concerning for pneumonia or atelectasis with associated pleural effusion. Mild left basilar subsegmental atelectasis is noted. Bony thorax is unremarkable. IMPRESSION: Stable right basilar opacity is noted concerning for pneumonia or atelectasis with associated pleural effusion. Mild left basilar subsegmental atelectasis is noted as well. Electronically Signed   By: Lupita Raider, M.D.   On: 08/31/2015 11:00   Ct  Head Wo Contrast  08/27/2015  CLINICAL DATA:  Initial evaluation for acute unresponsiveness. , left-sided weakness, now resolved. EXAM: CT HEAD WITHOUT CONTRAST TECHNIQUE: Contiguous axial images were obtained from the base of the skull through the vertex without intravenous contrast. COMPARISON:  None. FINDINGS: Age-related cerebral volume loss present. Patchy and confluent hypodensity within the periventricular and deep white matter both cerebral hemispheres most consistent with chronic small vessel ischemic disease. Scattered vascular calcifications within the carotid siphons and distal right vertebral artery. No acute large vessel territory infarct. Gray-white matter differentiation grossly maintained. Deep gray nuclei are symmetric. No acute  intracranial hemorrhage. No mass lesion, midline shift, or mass effect. No hydrocephalus. No extra-axial fluid collection. Scalp soft tissues within normal limits. No acute abnormality about the orbits. Paranasal sinuses mastoid air cells are clear. Calvarium intact. IMPRESSION: 1. No acute intracranial process. 2. Age-related cerebral atrophy with chronic small vessel ischemic disease. Electronically Signed   By: Rise Mu M.D.   On: 08/27/2015 02:31   [name] Wo Contrast  08/28/2015  CLINICAL DATA:  Initial evaluation for transient left-sided weakness following an episode of unresponsiveness and hypotension. EXAM: MRI HEAD WITHOUT CONTRAST MRA HEAD WITHOUT CONTRAST TECHNIQUE: Multiplanar, multiecho pulse sequences of the brain and surrounding structures were obtained without intravenous contrast. Angiographic images of the head were obtained using MRA technique without contrast. COMPARISON:  Prior CT from 08/27/2015. FINDINGS: MRI HEAD FINDINGS Diffuse prominence of the CSF containing spaces is compatible with generalized age-related cerebral atrophy. Patchy and confluent T2/FLAIR hyperintensity within the periventricular and deep white matter both cerebral hemispheres most consistent with chronic small vessel ischemic disease. Small remote lacunar infarct within the periventricular white matter of the left corona radiata. There is a few small foci of patchy restricted diffusion within the high left frontal lobe and right frontoparietal regions bilaterally (series 4, image 33, 35). Additional tiny punctate cortical infarct more inferiorly within the right occipital lobe (series 4, image 26). Associated signal loss seen on corresponding ADC map. No associated hemorrhage or mass effect. Bodies consistent with small acute ischemic infarcts. Given the distribution of these findings, underlying watershed etiology is favored. Normal intravascular flow voids are maintained. Additional scattered  subcentimeter foci of susceptibility artifact seen in within the peripheral cortices of the bilateral cerebral hemispheres on gradient echo sequence, consistent with small chronic micro hemorrhages. While these could potentially be related underlying hypertension, possible amyloid angiopathy could be considered given their somewhat peripheral distribution. No mass lesion, midline shift, or mass effect. No hydrocephalus. No extra-axial fluid collection. Craniocervical junction within normal limits. Mild degenerative spondylolysis noted within the visualized upper cervical spine. Pituitary gland normal. Thinning of the anterior body of the corpus callosum noted, which may related to remote ischemia. No acute abnormality about the orbits. Mild right-sided exophthalmos. Paranasal sinuses are clear. No mastoid effusion. Inner ear structures within normal limits. Bone marrow signal intensity within normal limits. No scalp soft tissue abnormality. MRA HEAD FINDINGS ANTERIOR CIRCULATION: Visualized distal cervical segments of the internal carotid arteries are patent with antegrade flow. Petrous segments widely patent. Scattered multi focal atheromatous irregularity within the cavernous and supraclinoid left ICA. There is a short-segment moderate stenosis at the proximal supraclinoid left ICA (series 5, image 72). Cavernous and supraclinoid right ICA widely patent. Right A1 segment widely patent. Left A1 segment slightly hypoplastic. Anterior communicating artery patent. Atheromatous irregularity within the anterior cerebral arteries bilaterally. M1 segments widely patent without stenosis or occlusion. Atheromatous irregularity within the left M1 segment.  MCA bifurcations within normal limits. MCA branches symmetric bilaterally. Distal small vessel disease present within the MCA branches bilaterally. POSTERIOR CIRCULATION: Vertebral arteries patent to the vertebrobasilar junction. Right vertebral artery slightly diminutive.  Posterior inferior cerebral arteries not well evaluated on this exam. Basilar artery widely patent. Small left anterior inferior cerebral artery noted. Superior cerebellar arteries patent. Both sub posterior cerebral arteries arise from the basilar artery and are well opacified to their distal aspects. Distal small vessel disease within the PCA branches bilaterally. There is a more focal short-segment moderate stenosis within the proximal right P2 segment (series 505, image 15). No aneurysm or vascular malformation. IMPRESSION: MRI HEAD IMPRESSION: 1. Small volume patchy cortical and subcortical ischemic infarcts within the left frontal and right frontoparietal region, with additional tiny cortical infarct within the right occipital lobe. Given the distribution of these infarcts, an underlying watershed etiology is favored. No associated hemorrhage or mass effect. 2. Scattered small chronic micro hemorrhages as above. While these might be related to chronic underlying hypertension, possible amyloid angiopathy could also be considered given their somewhat peripheral distribution. 3. Atrophy with chronic microvascular ischemic disease. MRA HEAD IMPRESSION: 1. No large vessel or proximal arterial branch occlusion identified. 2. Short-segment moderate stenosis within the supraclinoid left ICA. 3. Short-segment moderate stenosis within the proximal right P2 segment. 4. Distal small vessel atheromatous disease within the MCA, ACA, and PCA branches bilaterally Electronically Signed   By: Rise Mu M.D.   On: 08/28/2015 02:26   Mr Brain Wo Contrast  08/28/2015  CLINICAL DATA:  Initial evaluation for transient left-sided weakness following an episode of unresponsiveness and hypotension. EXAM: MRI HEAD WITHOUT CONTRAST MRA HEAD WITHOUT CONTRAST TECHNIQUE: Multiplanar, multiecho pulse sequences of the brain and surrounding structures were obtained without intravenous contrast. Angiographic images of the head  were obtained using MRA technique without contrast. COMPARISON:  Prior CT from 08/27/2015. FINDINGS: MRI HEAD FINDINGS Diffuse prominence of the CSF containing spaces is compatible with generalized age-related cerebral atrophy. Patchy and confluent T2/FLAIR hyperintensity within the periventricular and deep white matter both cerebral hemispheres most consistent with chronic small vessel ischemic disease. Small remote lacunar infarct within the periventricular white matter of the left corona radiata. There is a few small foci of patchy restricted diffusion within the high left frontal lobe and right frontoparietal regions bilaterally (series 4, image 33, 35). Additional tiny punctate cortical infarct more inferiorly within the right occipital lobe (series 4, image 26). Associated signal loss seen on corresponding ADC map. No associated hemorrhage or mass effect. Bodies consistent with small acute ischemic infarcts. Given the distribution of these findings, underlying watershed etiology is favored. Normal intravascular flow voids are maintained. Additional scattered subcentimeter foci of susceptibility artifact seen in within the peripheral cortices of the bilateral cerebral hemispheres on gradient echo sequence, consistent with small chronic micro hemorrhages. While these could potentially be related underlying hypertension, possible amyloid angiopathy could be considered given their somewhat peripheral distribution. No mass lesion, midline shift, or mass effect. No hydrocephalus. No extra-axial fluid collection. Craniocervical junction within normal limits. Mild degenerative spondylolysis noted within the visualized upper cervical spine. Pituitary gland normal. Thinning of the anterior body of the corpus callosum noted, which may related to remote ischemia. No acute abnormality about the orbits. Mild right-sided exophthalmos. Paranasal sinuses are clear. No mastoid effusion. Inner ear structures within normal  limits. Bone marrow signal intensity within normal limits. No scalp soft tissue abnormality. MRA HEAD FINDINGS ANTERIOR CIRCULATION: Visualized distal cervical segments of the internal  carotid arteries are patent with antegrade flow. Petrous segments widely patent. Scattered multi focal atheromatous irregularity within the cavernous and supraclinoid left ICA. There is a short-segment moderate stenosis at the proximal supraclinoid left ICA (series 5, image 72). Cavernous and supraclinoid right ICA widely patent. Right A1 segment widely patent. Left A1 segment slightly hypoplastic. Anterior communicating artery patent. Atheromatous irregularity within the anterior cerebral arteries bilaterally. M1 segments widely patent without stenosis or occlusion. Atheromatous irregularity within the left M1 segment. MCA bifurcations within normal limits. MCA branches symmetric bilaterally. Distal small vessel disease present within the MCA branches bilaterally. POSTERIOR CIRCULATION: Vertebral arteries patent to the vertebrobasilar junction. Right vertebral artery slightly diminutive. Posterior inferior cerebral arteries not well evaluated on this exam. Basilar artery widely patent. Small left anterior inferior cerebral artery noted. Superior cerebellar arteries patent. Both sub posterior cerebral arteries arise from the basilar artery and are well opacified to their distal aspects. Distal small vessel disease within the PCA branches bilaterally. There is a more focal short-segment moderate stenosis within the proximal right P2 segment (series 505, image 15). No aneurysm or vascular malformation. IMPRESSION: MRI HEAD IMPRESSION: 1. Small volume patchy cortical and subcortical ischemic infarcts within the left frontal and right frontoparietal region, with additional tiny cortical infarct within the right occipital lobe. Given the distribution of these infarcts, an underlying watershed etiology is favored. No associated hemorrhage  or mass effect. 2. Scattered small chronic micro hemorrhages as above. While these might be related to chronic underlying hypertension, possible amyloid angiopathy could also be considered given their somewhat peripheral distribution. 3. Atrophy with chronic microvascular ischemic disease. MRA HEAD IMPRESSION: 1. No large vessel or proximal arterial branch occlusion identified. 2. Short-segment moderate stenosis within the supraclinoid left ICA. 3. Short-segment moderate stenosis within the proximal right P2 segment. 4. Distal small vessel atheromatous disease within the MCA, ACA, and PCA branches bilaterally Electronically Signed   By: Rise Mu M.D.   On: 08/28/2015 02:26   Dg Chest Right Decubitus  08/31/2015  CLINICAL DATA:  Dyspnea. EXAM: CHEST - RIGHT DECUBITUS COMPARISON:  Same day. FINDINGS: Right lateral decubitus view of the chest demonstrates mild free flowing right pleural effusion. IMPRESSION: Mild free flowing right pleural effusion. Electronically Signed   By: Lupita Raider, M.D.   On: 08/31/2015 11:27   Dg Chest Port 1 View  08/31/2015  CLINICAL DATA:  Chest pain beginning after physical therapy yesterday. EXAM: PORTABLE CHEST 1 VIEW COMPARISON:  08/28/2015 FINDINGS: Borderline heart size without vascular congestion. Infiltration in the right lung base obscuring the right hemidiaphragm. Probable small right pleural effusion. Appearance suggest pneumonia. Atelectasis in the left lung base is improved since previous study. No pneumothorax. Calcified and tortuous aorta. Mild thoracic scoliosis convex towards the right. IMPRESSION: Infiltration in the right lung base with probable small right pleural effusion. Findings suggest pneumonia. Electronically Signed   By: Burman Nieves M.D.   On: 08/31/2015 03:21   Dg Chest Port 1 View  08/28/2015  CLINICAL DATA:  Shortness of breath.  Pulmonary edema. EXAM: PORTABLE CHEST 1 VIEW COMPARISON:  08/26/2015. FINDINGS: Left IJ line in  stable position. Cardiomegaly with bilateral pulmonary alveolar infiltrates. Slight improvement. Persistent small pleural effusions. No pneumothorax. IMPRESSION: 1. Left IJ line in stable position. 2. Cardiomegaly with persistent bilateral pulmonary infiltrates, partial clearing from prior exam. Persistent small pleural effusions. Electronically Signed   By: Maisie Fus  Register   On: 08/28/2015 07:18   Dg Chest Port 1 View  08/26/2015  CLINICAL  DATA:  80 year old female central line placement. Initial encounter. EXAM: PORTABLE CHEST 1 VIEW COMPARISON:  1306 hours today. FINDINGS: Portable AP semi upright view at 2259 hours. Left IJ central line has been placed. Tip is at the lower SVC level. No pneumothorax. Stable cardiac size and mediastinal contours. Interval mild regression of widespread patchy in perihilar opacity, improvement is greater in the upper lobes. Residual in the lower lobes. No large pleural effusion. IMPRESSION: 1. Left IJ central line placed, tip at the lower SVC level with no adverse features. 2. Some interval regression of pulmonary edema, residual in the lower lungs. Electronically Signed   By: Odessa Fleming M.D.   On: 08/26/2015 23:06   Dg Chest Portable 1 View  08/26/2015  CLINICAL DATA:  Shortness of breath. EXAM: PORTABLE CHEST 1 VIEW COMPARISON:  None. FINDINGS: Borderline cardiomegaly is noted. No pneumothorax or significant pleural effusion is noted. Diffuse patchy airspace opacities are noted throughout both lungs concerning for pneumonia or edema. Bony thorax is unremarkable. IMPRESSION: Diffuse patchy bilateral airspace opacities are noted concerning for pneumonia or edema. Electronically Signed   By: Lupita Raider, M.D.   On: 08/26/2015 13:24    PHYSICAL EXAM General: NAD Neck: No JVD, no thyromegaly or thyroid nodule.  Lungs: Clear to auscultation bilaterally with normal respiratory effort. CV: Nondisplaced PMI.  Heart regular S1/S2 with paradoxical S2 split, no S3/S4, no  murmur.  No peripheral edema.   Abdomen: Soft, nontender, no hepatosplenomegaly, no distention.  Neurologic: Alert and oriented x 3.  Psych: Normal affect. Extremities: No clubbing or cyanosis.   TELEMETRY: Reviewed telemetry pt in NSR with PVCs  ASSESSMENT AND PLAN: Ms Peppel was readmitted 10/14 after syncopal episode requiring brief CPR.  1. Syncope: Occurred while on bedside commode. Required brief CPR. No meds required. ? Vagal episode associated with micturation versus arrhythmia versus orthostatic. Of note, had SVT requiring amiodarone during her earlier hospital admission. No significant arrhythmias so far on telemetry (just PVCs).  - She has not been orthostatic when measured. - We cut back on her cardiac meds, BP has been stable with this change.  - With low EF and syncope, would plan Lifevest at discharge. Will monitor on telemetry for arrhythmia while in the hospital.  2. Chronic Systolic HF: Primarily nonischemic cardiomyopathy. ECHO 10/9 EF 20-25% with LVH. Volume status ok. No diuretics.  - Continue Coreg 12.5 mg bid.  - Continue current doses of losartan and Bidil. - Add low dose spironolactone today.     3. CAD: LHC 10/10 with 20% proximal LAD, 40% mid LAD, 90% distal, large high OM1 with 80% ostial stenosis. Medically managed. Troponin normal this admission.  Current chest soreness from CPR.  - Contine ASA and statin.  4. CVA: MRI head last admission showed small infarcts in watershed region. She had transient left-sided weakness last admission that pre-dated her cardiac cath, ?related to uncontrolled HTN.  5. H/o SVT: Required amiodarone briefly last admission. Will need to watch for recurrent arrhythmia given syncope => none so far. Continue Coreg.  6. HTN: BP normal to mildly elevated. Watch closely, will likely need to uptitrate meds carefully again. Renal artery dopplers did not show RAS.  7. Pulmonary: Persistent right-sided infiltrate on CXR, decreased  BS right base. Does not appear changed from prior. Normal WBCs, no fever. Had course of azithromycin. ?atelectasis with small effusion.   8. Needs PT/OT.  9. I think that she is ready to go back to [facility].  Will need to  see if she can be on telemetry at [facility]. If not, will need to be fitted with Lifevest before she goes over.    Marca Ancona 09/02/2015 9:04 AM

## 2015-09-02 NOTE — Evaluation (Signed)
Physical Therapy Evaluation Patient Details Name: Julie Stein MRN: [id number] DOB: 07/16/1935 Today's Date: 09/02/2015   History of Present Illness  Julie Stein is a 80 year old with a history of TIA, HTN, CAD, SVT and anemia readmitted from inpatient rehab 08/31/15 with syncopal episode requiring brief CPR. They were working on coordination, balance, gait at CIR; worth noting, pt HR increased to 170s and 180s post getting up on 10/16  Clinical Impression   Pt admitted with above diagnosis. Pt currently with functional limitations due to the deficits listed below (see PT Problem List).  Pt will benefit from skilled PT to increase their independence and safety with mobility to allow discharge to the venue listed below.    Strongly recommend return to CIR and resumption of rehab     Follow Up Recommendations CIR;Supervision/Assistance - 24 hour    Equipment Recommendations  Rolling walker with 5" wheels;3in1 (PT)    Recommendations for Other Services OT consult     Precautions / Restrictions Precautions Precautions: Fall Restrictions Weight Bearing Restrictions: No      Mobility  Bed Mobility Overal bed mobility: Needs Assistance Bed Mobility: Supine to Sit     Supine to sit: Supervision     General bed mobility comments: Cues to initiate; supervision for safety  Transfers Overall transfer level: Needs assistance Equipment used: 2 person hand held assist Transfers: Sit to/from Stand Sit to Stand: Mod assist;+2 physical assistance         General transfer comment: Noted adequate strenght for power-up, but decr coordination, trunk instability, and difficulty controlling center of mass over base of support  Ambulation/Gait Ambulation/Gait assistance: +2 physical assistance;Mod assist Ambulation Distance (Feet): 12 Feet Assistive device: 2 person hand held assist Gait Pattern/deviations: Ataxic     General Gait Details: Needing bil UE support for stability in upright  standing and walking activities  Stairs            Wheelchair Mobility    Modified Rankin (Stroke Patients Only)       Balance Overall balance assessment: Needs assistance   Sitting balance-Leahy Scale: Good       Standing balance-Leahy Scale: Poor                               Pertinent Vitals/Pain Pain Assessment: No/denies pain    Home Living Family/patient expects to be discharged to:: Inpatient rehab Living Arrangements: Spouse/significant other;Children Available Help at Discharge: Family;Available 24 hours/day (grandson and his wife in school during day, but other family members able to assist ) Type of Home: House Home Access: Stairs to enter Entrance Stairs-Rails: Right;Left;Can reach both Entrance Stairs-Number of Steps: 6 Home Layout: One level Home Equipment: None Additional Comments: Pt was gardening, doing yard work, cleaned house - was fully independent PTA.     Prior Function Level of Independence: Independent (prior to initial admission)         Comments: enjoys gardening     Hand Dominance   Dominant Hand: Right    Extremity/Trunk Assessment   Upper Extremity Assessment: Defer to OT evaluation           Lower Extremity Assessment: Generalized weakness      Cervical / Trunk Assessment: Kyphotic  Communication   Communication: No difficulties  Cognition Arousal/Alertness: Awake/alert Behavior During Therapy: WFL for tasks assessed/performed Overall Cognitive Status: Within Functional Limits for tasks assessed  General Comments      Exercises        Assessment/Plan    PT Assessment Patient needs continued PT services  PT Diagnosis Generalized weakness;Difficulty walking   PT Problem List Decreased activity tolerance;Decreased balance;Decreased mobility;Decreased coordination;Decreased knowledge of use of DME;Decreased safety awareness;Decreased knowledge of precautions  PT  Treatment Interventions DME instruction;Gait training;Functional mobility training;Therapeutic activities;Therapeutic exercise;Balance training;Patient/family education   PT Goals (Current goals can be found in the Care Plan section) Acute Rehab PT Goals Patient Stated Goal: to go home after rehab PT Goal Formulation: With patient Time For Goal Achievement: 09/26/15 Potential to Achieve Goals: Good    Frequency Min 4X/week   Barriers to discharge        Co-evaluation               End of Session Equipment Utilized During Treatment: Gait belt Activity Tolerance: Patient limited by fatigue Patient left: in chair;with call bell/phone within reach;with family/visitor present Nurse Communication: Mobility status    Functional Assessment Tool Used: Clinical Judgement Functional Limitation: Mobility: Walking and moving around Mobility: Walking and Moving Around Current Status [phone]): At least 20 percent but less than 40 percent impaired, limited or restricted Mobility: Walking and Moving Around Goal Status [phone]): 0 percent impaired, limited or restricted    Time: 0981-1914 PT Time Calculation (min) (ACUTE ONLY): 17 min   Charges:   PT Evaluation $Initial PT Evaluation Tier I: 1 Procedure     PT G Codes:   PT G-Codes **NOT FOR INPATIENT CLASS** Functional Assessment Tool Used: Clinical Judgement Functional Limitation: Mobility: Walking and moving around Mobility: Walking and Moving Around Current Status ([id number]): At least 20 percent but less than 40 percent impaired, limited or restricted Mobility: Walking and Moving Around Goal Status [phone]): 0 percent impaired, limited or restricted    Van Clines Hamff 09/02/2015, 5:29 PM  Van Clines, PT  Acute Rehabilitation Services Pager [phone] Office [phone]

## 2015-09-02 NOTE — Progress Notes (Addendum)
Called by nurse for tachycardia. Was with physical therapy, however did not do exercise, went into bathroom, by the time she resumed telemetry, she was in SVT with HR 180s. 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 Hesitant to drop her BP too much. However, will start some diltiazem. If does not work, will consider loading with amiodarone.   Ramond Dial PA Pager: [phone]   Addendum 5:35pm: By the time i left the floor, her HR is in 90s, in NSR with PACs. Instructed nursing staff to keep close eye on her BP. Would keep SBP >95 given recent watershed infarct.   Ramond Dial PA Pager: [phone]

## 2015-09-02 NOTE — Progress Notes (Signed)
Patient educated on fall prevention plan, pt moderate falls risk per assessment. Pt states her daughters will stay in the room with her tonight and she does not want the alarm on.

## 2015-09-03 ENCOUNTER — Inpatient Hospital Stay: Payer: Medicare Other | Admitting: Family Medicine

## 2015-09-03 DIAGNOSIS — I428 Other cardiomyopathies: Secondary | ICD-10-CM | POA: Diagnosis not present

## 2015-09-03 DIAGNOSIS — I2510 Atherosclerotic heart disease of native coronary artery without angina pectoris: Secondary | ICD-10-CM | POA: Diagnosis present

## 2015-09-03 DIAGNOSIS — R55 Syncope and collapse: Secondary | ICD-10-CM | POA: Diagnosis present

## 2015-09-03 DIAGNOSIS — J9811 Atelectasis: Secondary | ICD-10-CM | POA: Diagnosis not present

## 2015-09-03 DIAGNOSIS — Z7982 Long term (current) use of aspirin: Secondary | ICD-10-CM | POA: Diagnosis not present

## 2015-09-03 DIAGNOSIS — I429 Cardiomyopathy, unspecified: Secondary | ICD-10-CM | POA: Diagnosis present

## 2015-09-03 DIAGNOSIS — I471 Supraventricular tachycardia: Secondary | ICD-10-CM | POA: Diagnosis not present

## 2015-09-03 DIAGNOSIS — D509 Iron deficiency anemia, unspecified: Secondary | ICD-10-CM | POA: Diagnosis present

## 2015-09-03 DIAGNOSIS — I959 Hypotension, unspecified: Secondary | ICD-10-CM | POA: Diagnosis present

## 2015-09-03 DIAGNOSIS — I69354 Hemiplegia and hemiparesis following cerebral infarction affecting left non-dominant side: Secondary | ICD-10-CM | POA: Diagnosis not present

## 2015-09-03 DIAGNOSIS — I5022 Chronic systolic (congestive) heart failure: Secondary | ICD-10-CM | POA: Diagnosis not present

## 2015-09-03 DIAGNOSIS — I130 Hypertensive heart and chronic kidney disease with heart failure and stage 1 through stage 4 chronic kidney disease, or unspecified chronic kidney disease: Secondary | ICD-10-CM | POA: Diagnosis present

## 2015-09-03 DIAGNOSIS — E785 Hyperlipidemia, unspecified: Secondary | ICD-10-CM | POA: Diagnosis present

## 2015-09-03 DIAGNOSIS — N183 Chronic kidney disease, stage 3 (moderate): Secondary | ICD-10-CM | POA: Diagnosis present

## 2015-09-03 DIAGNOSIS — T4275XA Adverse effect of unspecified antiepileptic and sedative-hypnotic drugs, initial encounter: Secondary | ICD-10-CM | POA: Diagnosis present

## 2015-09-03 LAB — BASIC METABOLIC PANEL
ANION GAP: 7 (ref 5–15)
BUN: 12 mg/dL (ref 6–20)
CO2: 27 mmol/L (ref 22–32)
Calcium: 8.9 mg/dL (ref 8.9–10.3)
Chloride: 102 mmol/L (ref 101–111)
Creatinine, Ser: 1.03 mg/dL — ABNORMAL HIGH (ref 0.44–1.00)
GFR calc Af Amer: 59 mL/min — ABNORMAL LOW (ref >60)
GFR, EST NON AFRICAN AMERICAN: 51 mL/min — AB (ref >60)
Glucose, Bld: 108 mg/dL — ABNORMAL HIGH (ref 65–99)
POTASSIUM: 3.7 mmol/L (ref 3.5–5.1)
SODIUM: 136 mmol/L (ref 135–145)

## 2015-09-03 NOTE — Progress Notes (Addendum)
Patient ID: Julie Stein, female   DOB: Jun 11, 1935, 80 y.o.   MRN: [id number]   SUBJECTIVE:  Yesterday had recurrent SVT. Started on cardizem drip. Converted to NSR.   Denies SOB/Orthopnea.    Scheduled Meds: . aspirin EC  325 mg Oral Daily  . atorvastatin  80 mg Oral q1800  . carvedilol  12.5 mg Oral BID WC  . heparin subcutaneous  5,000 Units Subcutaneous 3 times per day  . isosorbide-hydrALAZINE  0.5 tablet Oral TID  . losartan  25 mg Oral Daily  . sodium chloride  3 mL Intravenous Q12H  . spironolactone  12.5 mg Oral Daily   Continuous Infusions: . diltiazem (CARDIZEM) infusion 5 mg/hr (09/02/15 1720)   PRN Meds:.acetaminophen **OR** acetaminophen, nitroGLYCERIN, ondansetron **OR** ondansetron (ZOFRAN) IV    Filed Vitals:   09/02/15 2116 09/02/15 2300 09/03/15 0025 09/03/15 0448  BP: 133/74 115/65 113/66 126/67  Pulse:   72 75  Temp:   98.5 F (36.9 C) 98.8 F (37.1 C)  TempSrc:   Oral Oral  Resp:   16   Height:      Weight:    106 lb 11.2 oz (48.399 kg)  SpO2:   99% 99%    Intake/Output Summary (Last 24 hours) at 09/03/15 0720 Last data filed at 09/03/15 0457  Gross per 24 hour  Intake 778.09 ml  Output    350 ml  Net 428.09 ml    LABS: Basic Metabolic Panel:  Recent Labs  30/86/57 0402 09/03/15 0320  NA 137 136  K 3.8 3.7  CL 102 102  CO2 26 27  GLUCOSE 111* 108*  BUN 15 12  CREATININE 1.05* 1.03*  CALCIUM 8.9 8.9   Liver Function Tests: No results for input(s): AST, ALT, ALKPHOS, BILITOT, PROT, ALBUMIN in the last 72 hours. No results for input(s): LIPASE, AMYLASE in the last 72 hours. CBC:  Recent Labs  09/02/15 0402  WBC 7.9  HGB 9.6*  HCT 30.0*  MCV 71.8*  PLT 246   Cardiac Enzymes:  Recent Labs  08/31/15 1015 08/31/15 1509  TROPONINI <0.03 0.03   BNP: Invalid input(s): POCBNP D-Dimer: No results for input(s): DDIMER in the last 72 hours. Hemoglobin A1C: No results for input(s): HGBA1C in the last 72 hours. Fasting  Lipid Panel: No results for input(s): CHOL, HDL, LDLCALC, TRIG, CHOLHDL, LDLDIRECT in the last 72 hours. Thyroid Function Tests: No results for input(s): TSH, T4TOTAL, T3FREE, THYROIDAB in the last 72 hours.  Invalid input(s): FREET3 Anemia Panel: No results for input(s): VITAMINB12, FOLATE, FERRITIN, TIBC, IRON, RETICCTPCT in the last 72 hours.  RADIOLOGY: Dg Chest 2 View  08/31/2015  CLINICAL DATA:  Dyspnea. EXAM: CHEST  2 VIEW COMPARISON:  August 31, 2015. FINDINGS: Stable cardiomediastinal silhouette. No pneumothorax is noted. Stable right basilar opacity is noted concerning for pneumonia or atelectasis with associated pleural effusion. Mild left basilar subsegmental atelectasis is noted. Bony thorax is unremarkable. IMPRESSION: Stable right basilar opacity is noted concerning for pneumonia or atelectasis with associated pleural effusion. Mild left basilar subsegmental atelectasis is noted as well. Electronically Signed   By: Lupita Raider, M.D.   On: 08/31/2015 11:00   Ct Head Wo Contrast  08/27/2015  CLINICAL DATA:  Initial evaluation for acute unresponsiveness. , left-sided weakness, now resolved. EXAM: CT HEAD WITHOUT CONTRAST TECHNIQUE: Contiguous axial images were obtained from the base of the skull through the vertex without intravenous contrast. COMPARISON:  None. FINDINGS: Age-related cerebral volume loss present. Patchy and confluent hypodensity  within the periventricular and deep white matter both cerebral hemispheres most consistent with chronic small vessel ischemic disease. Scattered vascular calcifications within the carotid siphons and distal right vertebral artery. No acute large vessel territory infarct. Gray-white matter differentiation grossly maintained. Deep gray nuclei are symmetric. No acute intracranial hemorrhage. No mass lesion, midline shift, or mass effect. No hydrocephalus. No extra-axial fluid collection. Scalp soft tissues within normal limits. No acute  abnormality about the orbits. Paranasal sinuses mastoid air cells are clear. Calvarium intact. IMPRESSION: 1. No acute intracranial process. 2. Age-related cerebral atrophy with chronic small vessel ischemic disease. Electronically Signed   By: Rise Mu M.D.   On: 08/27/2015 02:31   [name] Wo Contrast  08/28/2015  CLINICAL DATA:  Initial evaluation for transient left-sided weakness following an episode of unresponsiveness and hypotension. EXAM: MRI HEAD WITHOUT CONTRAST MRA HEAD WITHOUT CONTRAST TECHNIQUE: Multiplanar, multiecho pulse sequences of the brain and surrounding structures were obtained without intravenous contrast. Angiographic images of the head were obtained using MRA technique without contrast. COMPARISON:  Prior CT from 08/27/2015. FINDINGS: MRI HEAD FINDINGS Diffuse prominence of the CSF containing spaces is compatible with generalized age-related cerebral atrophy. Patchy and confluent T2/FLAIR hyperintensity within the periventricular and deep white matter both cerebral hemispheres most consistent with chronic small vessel ischemic disease. Small remote lacunar infarct within the periventricular white matter of the left corona radiata. There is a few small foci of patchy restricted diffusion within the high left frontal lobe and right frontoparietal regions bilaterally (series 4, image 33, 35). Additional tiny punctate cortical infarct more inferiorly within the right occipital lobe (series 4, image 26). Associated signal loss seen on corresponding ADC map. No associated hemorrhage or mass effect. Bodies consistent with small acute ischemic infarcts. Given the distribution of these findings, underlying watershed etiology is favored. Normal intravascular flow voids are maintained. Additional scattered subcentimeter foci of susceptibility artifact seen in within the peripheral cortices of the bilateral cerebral hemispheres on gradient echo sequence, consistent with small chronic  micro hemorrhages. While these could potentially be related underlying hypertension, possible amyloid angiopathy could be considered given their somewhat peripheral distribution. No mass lesion, midline shift, or mass effect. No hydrocephalus. No extra-axial fluid collection. Craniocervical junction within normal limits. Mild degenerative spondylolysis noted within the visualized upper cervical spine. Pituitary gland normal. Thinning of the anterior body of the corpus callosum noted, which may related to remote ischemia. No acute abnormality about the orbits. Mild right-sided exophthalmos. Paranasal sinuses are clear. No mastoid effusion. Inner ear structures within normal limits. Bone marrow signal intensity within normal limits. No scalp soft tissue abnormality. MRA HEAD FINDINGS ANTERIOR CIRCULATION: Visualized distal cervical segments of the internal carotid arteries are patent with antegrade flow. Petrous segments widely patent. Scattered multi focal atheromatous irregularity within the cavernous and supraclinoid left ICA. There is a short-segment moderate stenosis at the proximal supraclinoid left ICA (series 5, image 72). Cavernous and supraclinoid right ICA widely patent. Right A1 segment widely patent. Left A1 segment slightly hypoplastic. Anterior communicating artery patent. Atheromatous irregularity within the anterior cerebral arteries bilaterally. M1 segments widely patent without stenosis or occlusion. Atheromatous irregularity within the left M1 segment. MCA bifurcations within normal limits. MCA branches symmetric bilaterally. Distal small vessel disease present within the MCA branches bilaterally. POSTERIOR CIRCULATION: Vertebral arteries patent to the vertebrobasilar junction. Right vertebral artery slightly diminutive. Posterior inferior cerebral arteries not well evaluated on this exam. Basilar artery widely patent. Small left anterior inferior cerebral artery noted. Superior  cerebellar arteries  patent. Both sub posterior cerebral arteries arise from the basilar artery and are well opacified to their distal aspects. Distal small vessel disease within the PCA branches bilaterally. There is a more focal short-segment moderate stenosis within the proximal right P2 segment (series 505, image 15). No aneurysm or vascular malformation. IMPRESSION: MRI HEAD IMPRESSION: 1. Small volume patchy cortical and subcortical ischemic infarcts within the left frontal and right frontoparietal region, with additional tiny cortical infarct within the right occipital lobe. Given the distribution of these infarcts, an underlying watershed etiology is favored. No associated hemorrhage or mass effect. 2. Scattered small chronic micro hemorrhages as above. While these might be related to chronic underlying hypertension, possible amyloid angiopathy could also be considered given their somewhat peripheral distribution. 3. Atrophy with chronic microvascular ischemic disease. MRA HEAD IMPRESSION: 1. No large vessel or proximal arterial branch occlusion identified. 2. Short-segment moderate stenosis within the supraclinoid left ICA. 3. Short-segment moderate stenosis within the proximal right P2 segment. 4. Distal small vessel atheromatous disease within the MCA, ACA, and PCA branches bilaterally Electronically Signed   By: Rise Mu M.D.   On: 08/28/2015 02:26   Mr Brain Wo Contrast  08/28/2015  CLINICAL DATA:  Initial evaluation for transient left-sided weakness following an episode of unresponsiveness and hypotension. EXAM: MRI HEAD WITHOUT CONTRAST MRA HEAD WITHOUT CONTRAST TECHNIQUE: Multiplanar, multiecho pulse sequences of the brain and surrounding structures were obtained without intravenous contrast. Angiographic images of the head were obtained using MRA technique without contrast. COMPARISON:  Prior CT from 08/27/2015. FINDINGS: MRI HEAD FINDINGS Diffuse prominence of the CSF containing spaces is compatible  with generalized age-related cerebral atrophy. Patchy and confluent T2/FLAIR hyperintensity within the periventricular and deep white matter both cerebral hemispheres most consistent with chronic small vessel ischemic disease. Small remote lacunar infarct within the periventricular white matter of the left corona radiata. There is a few small foci of patchy restricted diffusion within the high left frontal lobe and right frontoparietal regions bilaterally (series 4, image 33, 35). Additional tiny punctate cortical infarct more inferiorly within the right occipital lobe (series 4, image 26). Associated signal loss seen on corresponding ADC map. No associated hemorrhage or mass effect. Bodies consistent with small acute ischemic infarcts. Given the distribution of these findings, underlying watershed etiology is favored. Normal intravascular flow voids are maintained. Additional scattered subcentimeter foci of susceptibility artifact seen in within the peripheral cortices of the bilateral cerebral hemispheres on gradient echo sequence, consistent with small chronic micro hemorrhages. While these could potentially be related underlying hypertension, possible amyloid angiopathy could be considered given their somewhat peripheral distribution. No mass lesion, midline shift, or mass effect. No hydrocephalus. No extra-axial fluid collection. Craniocervical junction within normal limits. Mild degenerative spondylolysis noted within the visualized upper cervical spine. Pituitary gland normal. Thinning of the anterior body of the corpus callosum noted, which may related to remote ischemia. No acute abnormality about the orbits. Mild right-sided exophthalmos. Paranasal sinuses are clear. No mastoid effusion. Inner ear structures within normal limits. Bone marrow signal intensity within normal limits. No scalp soft tissue abnormality. MRA HEAD FINDINGS ANTERIOR CIRCULATION: Visualized distal cervical segments of the internal  carotid arteries are patent with antegrade flow. Petrous segments widely patent. Scattered multi focal atheromatous irregularity within the cavernous and supraclinoid left ICA. There is a short-segment moderate stenosis at the proximal supraclinoid left ICA (series 5, image 72). Cavernous and supraclinoid right ICA widely patent. Right A1 segment widely patent. Left A1 segment slightly  hypoplastic. Anterior communicating artery patent. Atheromatous irregularity within the anterior cerebral arteries bilaterally. M1 segments widely patent without stenosis or occlusion. Atheromatous irregularity within the left M1 segment. MCA bifurcations within normal limits. MCA branches symmetric bilaterally. Distal small vessel disease present within the MCA branches bilaterally. POSTERIOR CIRCULATION: Vertebral arteries patent to the vertebrobasilar junction. Right vertebral artery slightly diminutive. Posterior inferior cerebral arteries not well evaluated on this exam. Basilar artery widely patent. Small left anterior inferior cerebral artery noted. Superior cerebellar arteries patent. Both sub posterior cerebral arteries arise from the basilar artery and are well opacified to their distal aspects. Distal small vessel disease within the PCA branches bilaterally. There is a more focal short-segment moderate stenosis within the proximal right P2 segment (series 505, image 15). No aneurysm or vascular malformation. IMPRESSION: MRI HEAD IMPRESSION: 1. Small volume patchy cortical and subcortical ischemic infarcts within the left frontal and right frontoparietal region, with additional tiny cortical infarct within the right occipital lobe. Given the distribution of these infarcts, an underlying watershed etiology is favored. No associated hemorrhage or mass effect. 2. Scattered small chronic micro hemorrhages as above. While these might be related to chronic underlying hypertension, possible amyloid angiopathy could also be  considered given their somewhat peripheral distribution. 3. Atrophy with chronic microvascular ischemic disease. MRA HEAD IMPRESSION: 1. No large vessel or proximal arterial branch occlusion identified. 2. Short-segment moderate stenosis within the supraclinoid left ICA. 3. Short-segment moderate stenosis within the proximal right P2 segment. 4. Distal small vessel atheromatous disease within the MCA, ACA, and PCA branches bilaterally Electronically Signed   By: Rise Mu M.D.   On: 08/28/2015 02:26   Dg Chest Right Decubitus  08/31/2015  CLINICAL DATA:  Dyspnea. EXAM: CHEST - RIGHT DECUBITUS COMPARISON:  Same day. FINDINGS: Right lateral decubitus view of the chest demonstrates mild free flowing right pleural effusion. IMPRESSION: Mild free flowing right pleural effusion. Electronically Signed   By: Lupita Raider, M.D.   On: 08/31/2015 11:27   Dg Chest Port 1 View  08/31/2015  CLINICAL DATA:  Chest pain beginning after physical therapy yesterday. EXAM: PORTABLE CHEST 1 VIEW COMPARISON:  08/28/2015 FINDINGS: Borderline heart size without vascular congestion. Infiltration in the right lung base obscuring the right hemidiaphragm. Probable small right pleural effusion. Appearance suggest pneumonia. Atelectasis in the left lung base is improved since previous study. No pneumothorax. Calcified and tortuous aorta. Mild thoracic scoliosis convex towards the right. IMPRESSION: Infiltration in the right lung base with probable small right pleural effusion. Findings suggest pneumonia. Electronically Signed   By: Burman Nieves M.D.   On: 08/31/2015 03:21   Dg Chest Port 1 View  08/28/2015  CLINICAL DATA:  Shortness of breath.  Pulmonary edema. EXAM: PORTABLE CHEST 1 VIEW COMPARISON:  08/26/2015. FINDINGS: Left IJ line in stable position. Cardiomegaly with bilateral pulmonary alveolar infiltrates. Slight improvement. Persistent small pleural effusions. No pneumothorax. IMPRESSION: 1. Left IJ line in  stable position. 2. Cardiomegaly with persistent bilateral pulmonary infiltrates, partial clearing from prior exam. Persistent small pleural effusions. Electronically Signed   By: Maisie Fus  Register   On: 08/28/2015 07:18   Dg Chest Port 1 View  08/26/2015  CLINICAL DATA:  80 year old female central line placement. Initial encounter. EXAM: PORTABLE CHEST 1 VIEW COMPARISON:  1306 hours today. FINDINGS: Portable AP semi upright view at 2259 hours. Left IJ central line has been placed. Tip is at the lower SVC level. No pneumothorax. Stable cardiac size and mediastinal contours. Interval mild regression of widespread  patchy in perihilar opacity, improvement is greater in the upper lobes. Residual in the lower lobes. No large pleural effusion. IMPRESSION: 1. Left IJ central line placed, tip at the lower SVC level with no adverse features. 2. Some interval regression of pulmonary edema, residual in the lower lungs. Electronically Signed   By: Odessa Fleming M.D.   On: 08/26/2015 23:06   Dg Chest Portable 1 View  08/26/2015  CLINICAL DATA:  Shortness of breath. EXAM: PORTABLE CHEST 1 VIEW COMPARISON:  None. FINDINGS: Borderline cardiomegaly is noted. No pneumothorax or significant pleural effusion is noted. Diffuse patchy airspace opacities are noted throughout both lungs concerning for pneumonia or edema. Bony thorax is unremarkable. IMPRESSION: Diffuse patchy bilateral airspace opacities are noted concerning for pneumonia or edema. Electronically Signed   By: Lupita Raider, M.D.   On: 08/26/2015 13:24    PHYSICAL EXAM General: NAD. In Bed.  Neck: JVP 8-9 cm, no thyromegaly or thyroid nodule.  Lungs: Clear to auscultation bilaterally with normal respiratory effort. CV: Nondisplaced PMI.  Heart regular S1/S2 with paradoxical S2 split, no S3/S4, no murmur.  No peripheral edema.   Abdomen: Soft, nontender, no hepatosplenomegaly, no distention.  Neurologic: Alert and oriented x 3.  Psych: Normal affect. Extremities:  No clubbing or cyanosis.   TELEMETRY: NSR ---SVT over night.   ASSESSMENT AND PLAN: Ms Julie Stein was readmitted 10/14 after syncopal episode requiring brief CPR.  1. Syncope: Occurred while on bedside commode. Required brief CPR. No meds required. ? Vagal episode associated with micturation versus arrhythmia versus orthostatic. Of note, had SVT requiring amiodarone during her earlier hospital admission. No significant arrhythmias so far on telemetry (just PVCs).  - She has not been orthostatic when measured. - We cut back on her cardiac meds, BP has been stable with this change.  - With low EF and syncope, EP consulted today for possible ablation for recurrent SVT.  Also ask them to weigh in regarding [employer].  2. Chronic Systolic HF: Primarily nonischemic cardiomyopathy. ECHO 10/9 EF 20-25% with LVH. Volume status ok. No diuretics.  - Continue Coreg 12.5 mg bid.  - Continue current doses of losartan and Bidil. - Continue low dose spironolactone 12.5 mg spiro.     3. CAD: LHC 10/10 with 20% proximal LAD, 40% mid LAD, 90% distal, large high OM1 with 80% ostial stenosis. Medically managed. Troponin normal this admission.  Current chest soreness from CPR.  - Contine ASA and statin.  4. CVA: MRI head last admission showed small infarcts in watershed region. She had transient left-sided weakness last admission that pre-dated her cardiac cath, ?related to uncontrolled HTN.  5. H/o SVT: Required amiodarone briefly last admission. Yesterday back in SVT. Cardizem drip restarted. Stop cardizem now that back in NSR (low EF). Consult EP for possible ablation. Continue Coreg.  6. HTN: Stable today. Watch closely, may need to uptitrate meds carefully again. Renal artery dopplers did not show RAS.  7. Pulmonary: Persistent right-sided infiltrate on CXR, decreased BS right base. Does not appear changed from prior. Normal WBCs, no fever. Had course of azithromycin. ?atelectasis with small effusion.    8. Needs PT/OT.    Amy Clegg 09/03/2015 7:20 AM   Patient seen with NP, agree with the above note.  She had recurrent SVT, rate 180 last night.  Felt it but not dizzy.  Had SVT earlier in hospitalization and was given amiodarone. Had syncope in cardiac rehab, ?related to the same SVT or due to vagal event.  I  reviewed ECG, not clearly atrial flutter but would like EP opinion.  Will have EP weight in, if possible to ablate think this would be reasonable.  Discussed with Dr Elberta Fortis, he will see.  Can stop diltiazem gtt.  Keep other meds the same for now.    Continue PT/OT, will go back to CIR when acute issues are dealt with.   She is now willing to get heparin subcutaneously for DVT prophylaxis.   Marca Ancona 09/03/2015 7:52 AM

## 2015-09-03 NOTE — PMR Pre-admission (Signed)
PMR Admission Coordinator Pre-Admission Assessment  Patient: Julie Stein is an 80 y.o., female MRN: [id number] DOB: 11-19-1934 Height: 4\' 11"  (149.9 cm) Weight: 48.172 kg (106 lb 3.2 oz) (scale b)              Insurance Information HMO: No   PPO:       PCP:       IPA:       80/20:       OTHER:   PRIMARY: Medicare part A only      Policy#: [id number]      Subscriber: Lyn Henri CM Name:        Phone#:       Fax#:   Pre-Cert#:        Employer:  Not employed Benefits:  Phone #:       Name: Checked in [place]. Date: 04/17/02 part A only     Deduct: $1288      Out of Pocket Max: none      Life Max: unlimited CIR: 100%      SNF: 100 days Outpatient:       Co-Pay:   Home Health: 100%      Co-Pay: none DME:       Co-Pay:   Providers: patient's choice  Medicaid Application Date:        Case Manager:   Disability Application Date:        Case Worker:    Emergency Contact Information Contact Information    Name Relation Home Work Mobile   Rasberry,James Spouse [phone]  [phone]   Kempton,Wanda Daughter   [phone]   Sarria,Rasheedah Daughter [phone]  [phone]     Current Medical History  Patient Admitting Diagnosis: B infarcts watershed pattern  History of Present Illness: A 80 y.o. right handed female history of hypertension. Patient lives with grandson and his wife large extended supportive family. Independent prior to admission and active. One level home 6 steps to entry. Presented 08/26/2015 with sudden onset of shortness of breath noted recent bouts of diarrhea, congestion and cough. Chest x-ray shows significant pulmonary edema. EKG with frequent PVCs bigeminy 2 runs of SVT heart rate 200 bpm. She became hypotensive placed on amiodarone drip. Bedside echocardiogram with ejection fraction 20-25% diffuse hypokinesis, RV functioning well. Placed on broad-spectrum antibiotic suspect viral illness. Troponin mildly elevated 0.22 days 0.29. Cardiology service is consulted and  underwent cardiac catheterization showing severe distal LAD stenosis moderate ostial large OMI stenosis. Advised medical management. Post catheterization noted left-sided weakness. MRI of the brain showed small volume patchy cortical and subcortical ischemic infarcts within the left frontal and right frontoparietal region with additional tiny cortical infarcts within the right occipital lobe. MRA of the head with no major vessel occlusion or stenosis. Placed on aspirin for CVA prophylaxis. Subcutaneous heparin for DVT prophylaxis. Physical therapy evaluation completed 08/29/2015 with recommendations of physical medicine rehabilitation consult. Patient was admitted for comprehensive rehabilitation program 08/29/2015. On 08/31/2015 developed some nonspecific chest pressure lasting perhaps an hour and resolve spontaneously. Later she was up to the bathroom urinating had a syncopal event noted asystole lasted approximately less than a minute. CODE BLUE was called was given at least 2-3 minutes at bedtime chest compressions and regained pulse became more alert and awake. She did not recall the incident. She was discharged to acute care services for ongoing monitoring and workup. Troponin was 0.05. Cardiology follow-up. Chest x-ray showed questionable infiltrate right lower lobe probable right small pleural effusion similar to prior. EKG  SVT with frequent PVCs. Question vagal episode associated with micturition versus arrhythmia versus orthostasis. Ongoing bouts of tachycardia SVT heart rate had been in the 180s and had been maintained on intravenous Cardizem.Lottie Rater EP study 09/05/2015 unable to trigger an arrhythmia. Rate has been controlled and advised to continue Coreg for now. Therapies have been reinitiated with progressive gains. Patient to be admitted for comprehensive inpatient rehabilitation program.     Past Medical History  Past Medical History  Diagnosis Date  . Hypertension   . CHF (congestive  heart failure) (HCC)   . Stroke (HCC)   . SVT (supraventricular tachycardia) (HCC)   . CAD (coronary artery disease)   . Cardiomyopathy (HCC)     Family History  family history includes Hypertension in her daughter.  Prior Rehab/Hospitalizations: Admitted in CIR on 08/29/15 and transferred back to acute hospital on 08/31/15  Has the patient had major surgery during 100 days prior to admission? No  Current Medications   Current facility-administered medications:  .  0.9 %  sodium chloride infusion, 250 mL, Intravenous, PRN, Will Jorja Loa, MD .  [DISCONTINUED] acetaminophen (TYLENOL) tablet 650 mg, 650 mg, Oral, Q6H PRN, 650 mg at 09/04/15 2226 **OR** acetaminophen (TYLENOL) suppository 650 mg, 650 mg, Rectal, Q6H PRN, Eduard Clos, MD .  acetaminophen (TYLENOL) tablet 650 mg, 650 mg, Oral, Q4H PRN, Will Jorja Loa, MD, 650 mg at 09/05/15 1314 .  aspirin EC tablet 81 mg, 81 mg, Oral, Daily, Penny Pia, MD, 81 mg at 09/06/15 0935 .  atorvastatin (LIPITOR) tablet 80 mg, 80 mg, Oral, q1800, Eduard Clos, MD, 80 mg at 09/05/15 1736 .  carvedilol (COREG) tablet 12.5 mg, 12.5 mg, Oral, BID WC, Penny Pia, MD, 12.5 mg at 09/06/15 0802 .  heparin injection 5,000 Units, 5,000 Units, Subcutaneous, 3 times per day, Will Jorja Loa, MD, 5,000 Units at 09/06/15 0529 .  isosorbide-hydrALAZINE (BIDIL) 20-37.5 MG per tablet 0.5 tablet, 0.5 tablet, Oral, TID, Graciella Freer, PA-C, 0.5 tablet at 09/06/15 0935 .  losartan (COZAAR) tablet 25 mg, 25 mg, Oral, Daily, Amy D Clegg, NP, 25 mg at 09/06/15 0935 .  nitroGLYCERIN (NITROSTAT) SL tablet 0.4 mg, 0.4 mg, Sublingual, Q5 min PRN, Eduard Clos, MD .  ondansetron [facility]) injection 4 mg, 4 mg, Intravenous, Q6H PRN, Will Jorja Loa, MD .  ondansetron (ZOFRAN) tablet 4 mg, 4 mg, Oral, Q6H PRN **OR** [DISCONTINUED] ondansetron (ZOFRAN) injection 4 mg, 4 mg, Intravenous, Q6H PRN, Eduard Clos, MD .  sodium  chloride 0.9 % injection 3 mL, 3 mL, Intravenous, Q12H, Eduard Clos, MD, 3 mL at 09/06/15 1000 .  sodium chloride 0.9 % injection 3 mL, 3 mL, Intravenous, Q12H, Will Jorja Loa, MD, 3 mL at 09/05/15 1115 .  sodium chloride 0.9 % injection 3 mL, 3 mL, Intravenous, PRN, Will Jorja Loa, MD .  spironolactone (ALDACTONE) tablet 25 mg, 25 mg, Oral, Daily, Laurey Morale, MD, 25 mg at 09/06/15 0935  Patients Current Diet: Diet - low sodium heart healthy Diet 2 gram sodium Room service appropriate?: Yes; Fluid consistency:: Thin  Precautions / Restrictions Precautions Precautions: Fall Restrictions Weight Bearing Restrictions: No   Has the patient had 2 or more falls or a fall with injury in the past year?No  Prior Activity Level Community (5-7x/wk): Goes out 3-4 X a week.  Was driving  Journalist, newspaper / Equipment Home Assistive Devices/Equipment: None Home Equipment: None  Prior Device Use: Indicate devices/aids used by the patient  prior to current illness, exacerbation or injury? None  Prior Functional Level Prior Function Level of Independence: Independent (prior to initial admission) Comments: enjoys gardening  Self Care: Did the patient need help bathing, dressing, using the toilet or eating?  Independent  Indoor Mobility: Did the patient need assistance with walking from room to room (with or without device)? Independent  Stairs: Did the patient need assistance with internal or external stairs (with or without device)? Independent  Functional Cognition: Did the patient need help planning regular tasks such as shopping or remembering to take medications? Independent.  Patient says the only thing she needed help with was gardening.  Current Functional Level Cognition  Arousal/Alertness: Awake/alert Overall Cognitive Status: Within Functional Limits for tasks assessed Orientation Level: Oriented X4    Extremity Assessment (includes  Sensation/Coordination)  Upper Extremity Assessment: Generalized weakness  Lower Extremity Assessment: Generalized weakness    ADLs  Overall ADL's : Needs assistance/impaired Toilet Transfer: Minimal assistance, RW Toileting- Clothing Manipulation and Hygiene: Minimal assistance General ADL Comments: REquires general set up with UB ADL and min A with LB ADL for balance.     Mobility  Overal bed mobility: Needs Assistance Bed Mobility: Supine to Sit Supine to sit: Supervision General bed mobility comments: Cues to initiate; supervision for safety    Transfers  Overall transfer level: Needs assistance Equipment used: 1 person hand held assist Transfers: Sit to/from Stand Sit to Stand: Min assist, +2 safety/equipment General transfer comment: 2nd person for equipment. Pt with posterior sway.    Ambulation / Gait / Stairs / Wheelchair Mobility  Ambulation/Gait Ambulation/Gait assistance: +2 physical assistance Ambulation Distance (Feet): 5 Feet Assistive device: 2 person hand held assist Gait Pattern/deviations: Ataxic General Gait Details: Pt took a few steps forward and back but gait deferred due to posterior and L lateral lean as well as L LE buckling.    Posture / Balance Balance Overall balance assessment: Needs assistance Sitting-balance support: No upper extremity supported, Feet supported Sitting balance-Leahy Scale: Good Standing balance support: Bilateral upper extremity supported Standing balance-Leahy Scale: Poor Standing balance comment: pt with posterior and L lateral lean    Special needs/care consideration BiPAP/CPAP No CPM No Continuous Drip IV No Dialysis No        Life Vest No Oxygen Yes, currently with 02 in acute hospital, but not at home Special Bed No Trach SizeNo Wound Vac (area) No     Skin No                              Bowel mgmt: Last BM 09/02/15 Bladder mgmt: WDL Diabetic mgmt No    Previous Home Environment Living Arrangements:  Spouse/significant other, Children Available Help at Discharge: Family, Available 24 hours/day (grandson and his wife in school during day, but other family members able to assist ) Type of Home: House Home Layout: One level Home Access: Stairs to enter Entrance Stairs-Rails: Right, Left, Can reach both Entrance Stairs-Number of Steps: 6 Bathroom Shower/Tub: Psychologist, counselling, Engineer, building services: Standard Bathroom Accessibility: Yes How Accessible: Accessible via walker Home Care Services: No Additional Comments: Pt was gardening, doing yard work, cleaned house - was fully independent PTA.   Discharge Living Setting Plans for Discharge Living Setting: Patient's home, Lives with (comment) (Grandson and his wife live with patient.) Type of Home at Discharge: House Discharge Home Layout: One level Discharge Home Access: Stairs to enter Entrance Stairs-Number of Steps: 3-4 steps at  back and 7 steps at the front Does the patient have any problems obtaining your medications?: No  Social/Family/Support Systems Patient Roles: Parent, Adult nurse Information: See emergency contacts Anticipated Caregiver: Daughter's and grandchildren Ability/Limitations of Caregiver: Lucila Maine and his wife live with patient but attend school during the morning.  Granddaughter-in-law in home in the afternoons. Caregiver Availability: 24/7 (Can make arrangements for 24/7 care.) Discharge Plan Discussed with Primary Caregiver: Yes Is Caregiver In Agreement with Plan?: Yes Does Caregiver/Family have Issues with Lodging/Transportation while Pt is in Rehab?: No  Goals/Additional Needs Patient/Family Goal for Rehab: PT/OT supervision goals Expected length of stay: 7-10 days Cultural Considerations: Muslim, does not eat port. Dietary Needs: Heart diet, thin liquids Equipment Needs: TBD Pt/Family Agrees to Admission and willing to participate: Yes Program Orientation Provided & Reviewed with Pt/Caregiver  Including Roles  & Responsibilities: Yes  Decrease burden of Care through IP rehab admission: N/A  Possible need for SNF placement upon discharge: Not anticipated  Patient Condition: This patient's medical and functional status has changed since the consult dated: 08/29/15 in which the Rehabilitation Physician determined and documented that the patient's condition is appropriate for intensive rehabilitative care in an inpatient rehabilitation facility. See "History of Present Illness" (above) for medical update. Functional changes are: Currently requiring min assist to ambulate 5 ft +2 HHA. Patient's medical and functional status update has been discussed with the Rehabilitation physician and patient remains appropriate for inpatient rehabilitation. Will admit to inpatient rehab today.  Preadmission Screen Completed By:  Trish Mage, 09/06/2015 11:09 AM ______________________________________________________________________   Discussed status with Dr. Allena Katz on 09/06/15 at 1041 and received telephone approval for admission today.  Admission Coordinator:  Trish Mage, time1041/Date10/20/16

## 2015-09-03 NOTE — Progress Notes (Signed)
Occupational Therapy Evaluation Patient Details Name: Julie Stein MRN: [id number] DOB: 02-20-1935 Today's Date: 09/03/2015    History of Present Illness Julie Stein is a 80 year old with a history of TIA, HTN, CAD, SVT and anemia readmitted from inpatient rehab 08/31/15 with syncopal episode requiring brief CPR. They were working on coordination, balance, gait at CIR; worth noting, pt HR increased to 170s and 180s post getting up on 10/16   Clinical Impression   Pt transferred to acute from rehab due to decline in medical status. Pt requires min A for ADL and mobility and would benefit from continued rehab at CIR to return pt to mod I level and safely D/C home with family. Vitals stable. See vital flowsheet.     Follow Up Recommendations  CIR;Supervision/Assistance - 24 hour    Equipment Recommendations  3 in 1 bedside comode    Recommendations for Other Services Rehab consult     Precautions / Restrictions Precautions Precautions: Fall Restrictions Weight Bearing Restrictions: No      Mobility Bed Mobility Overal bed mobility: Needs Assistance Bed Mobility: Supine to Sit     Supine to sit: Supervision        Transfers Overall transfer level: Needs assistance Equipment used: 1 person hand held assist Transfers: Sit to/from Stand Sit to Stand: Min assist;+2 physical assistance - for equipment and to ease pt anxiety         General transfer comment: 2nd person for equipment. Pt with posterior sway.    Balance     Sitting balance-Leahy Scale: Good       Standing balance-Leahy Scale: Poor - difficulty maintaining midline postural control. Posterior bias. Improves when stable support placed in from of pt.                               ADL Overall ADL's : Needs assistance/impaired                         Toilet Transfer: Minimal assistance;RW   Toileting- Clothing Manipulation and Hygiene: Minimal assistance         General ADL  Comments: REquires general set up with UB ADL and min A with LB ADL for balance.      Vision Tracking/Visual Pursuits: Decreased smoothness of horizontal tracking;Requires cues, head turns, or add eye shifts to track Additional Comments: appears intact   Perception     Praxis Praxis Praxis tested?: Within functional limits    Pertinent Vitals/Pain       Hand Dominance Right   Extremity/Trunk Assessment Upper Extremity Assessment Upper Extremity Assessment: Generalized weakness; L UE generalized weakness. Pt using LUE functionally. Appears to have improved. Will further assess   Lower Extremity Assessment Lower Extremity Assessment: Generalized weakness   Cervical / Trunk Assessment Cervical / Trunk Assessment: Kyphotic   Communication Communication Communication: No difficulties   Cognition Arousal/Alertness: Awake/alert Behavior During Therapy: WFL for tasks assessed/performed Overall Cognitive Status: Within Functional Limits for tasks assessed                     General Comments       Exercises       Shoulder Instructions      Home Living Family/patient expects to be discharged to:: Inpatient rehab Living Arrangements: Spouse/significant other;Children Available Help at Discharge: Family;Available 24 hours/day (grandson and his wife in school during day, but other family members  able to assist ) Type of Home: House Home Access: Stairs to enter [employer] of Steps: 6 Entrance Stairs-Rails: Right;Left;Can reach both Home Layout: One level     Bathroom Shower/Tub: Walk-in Pensions consultant: Standard Bathroom Accessibility: Yes How Accessible: Accessible via walker Home Equipment: None   Additional Comments: Pt was gardening, doing yard work, cleaned house - was fully independent PTA.       Prior Functioning/Environment Level of Independence: Independent (prior to initial admission)        Comments: enjoys  gardening    OT Diagnosis: Generalized weakness   OT Problem List: Decreased strength;Decreased activity tolerance;Impaired balance (sitting and/or standing);Decreased safety awareness;Cardiopulmonary status limiting activity   OT Treatment/Interventions: Self-care/ADL training;Therapeutic exercise;DME and/or AE instruction;Patient/family education;Balance training    OT Goals(Current goals can be found in the care plan section) Acute Rehab OT Goals Patient Stated Goal: to be independent again OT Goal Formulation: With patient Time For Goal Achievement: 09/17/15 Potential to Achieve Goals: Good ADL Goals Pt Will Perform Lower Body Bathing: with modified independence;sit to/from stand Pt Will Perform Lower Body Dressing: with modified independence;sit to/from stand Pt Will Transfer to Toilet: with modified independence;ambulating (RW level) Pt Will Perform Toileting - Clothing Manipulation and hygiene: with modified independence;sit to/from stand Pt Will Perform Tub/Shower Transfer: with supervision;rolling walker;with caregiver independent in assisting;ambulating;3 in 1;tub bench  OT Frequency: Min 2X/week   Barriers to D/C:            Co-evaluation PT/OT/SLP Co-Evaluation/Treatment: Yes Reason for Co-Treatment: For patient/therapist safety   OT goals addressed during session: ADL's and self-care      End of Session Nurse Communication: Mobility status  Activity Tolerance: Patient tolerated treatment well Patient left: in bed;with call bell/phone within reach;with family/visitor present   Time: 9758-8325 OT Time Calculation (min): 26 min Charges:  OT General Charges $OT Visit: 1 Procedure OT Evaluation $Initial OT Evaluation Tier I: 1 Procedure G-Codes:    Kaylin Schellenberg,HILLARY 09-14-2015, 11:51 AM   Luisa Dago, OTR/L  [phone] 14-Sep-2015

## 2015-09-03 NOTE — Progress Notes (Addendum)
[facility] PROGRESS NOTE  Julie Stein ZOX:[id number] DOB: 13-Sep-1935 DOA: 08/31/2015 PCP: No PCP Per Patient  Assessment/Plan: Active Problems:     Syncope - etiology uncertain. Vasovagal vs arrythmia. Telemetry has been negative for arrythmias.  May have been vasovagal as syncopal episode happened while she was going to the bathroom. Addendum: Pt had SVT and required further evaluation and recommendations from cardiology. Discharge will be canceled and plans are for cardiology to further evaluate patient with possible ablation. EP specialists consulted  Hypertension - Pt on carvedilol, bidil, losartan and blood pressures relatively well controlled on this regimen.    Chronic systolic heart failure [facility]) - Cardiology on board. Continue medication above listed under htn    Microcytic hypochromic anemia - stable, no active bleeding    Chest pain - From chest compressions. Patient reports improvement in condition - continue supportive therapy  Code Status: full Family Communication: None at bedside Disposition Plan: Pending improvement in condition   Consultants:  Cardiology  Procedures:  None  Antibiotics:  None  HPI/Subjective: Pt has no new complaints. States that her chest pain is improving.  Objective: Filed Vitals:   09/03/15 1140  BP:   Pulse: 77  Temp: 98.1 F (36.7 C)  Resp:     Intake/Output Summary (Last 24 hours) at 09/03/15 1633 Last data filed at 09/03/15 1311  Gross per 24 hour  Intake 298.09 ml  Output    600 ml  Net -301.91 ml   Filed Weights   09/02/15 0541 09/03/15 0448  Weight: 49.9 kg (110 lb 0.2 oz) 48.399 kg (106 lb 11.2 oz)    Exam:   General:  Pt in nad,alert and awake  Cardiovascular: rrr, no mrg  Respiratory: cta bl, no wheezes  Abdomen: soft, nd, nt  Musculoskeletal: no cyanosis or clubbing   Data Reviewed: Basic Metabolic Panel:  Recent Labs Lab 08/28/15 0530  08/30/15 0551 08/31/15 0319  09/01/15 0313 09/02/15 0402 09/03/15 0320  NA 136  < > 139 140 140 137 136  K 3.6  < > 3.8 3.6 3.8 3.8 3.7  CL 102  < > 104 105 108 102 102  CO2 27  < > GLUCOSE 111*  < > 113* 154* 111* 111* 108*  BUN 20  < > CREATININE 1.15*  < > 1.04* 1.19* 1.11* 1.05* 1.03*  CALCIUM 8.6*  < > 9.4 9.4 9.0 8.9 8.9  MG 1.8  --   --  2.1  --   --   --   PHOS 2.6  --   --   --   --   --   --   < > = values in this interval not displayed. Liver Function Tests:  Recent Labs Lab 08/30/15 0551 08/31/15 0319  AST 27 33  ALT 19 24  ALKPHOS 63 65  BILITOT 0.6 0.7  PROT 6.0* 6.6  ALBUMIN 2.4* 3.0*   No results for input(s): LIPASE, AMYLASE in the last 168 hours. No results for input(s): AMMONIA in the last 168 hours. CBC:  Recent Labs Lab 08/28/15 1339 08/29/15 0442 08/30/15 0551 08/31/15 0319 09/02/15 0402  WBC 11.6* 12.2* 9.8 8.7 7.9  NEUTROABS 9.9*  --  7.0 7.2  --   HGB 9.1* 9.5* 10.2* 10.8* 9.6*  HCT 27.8* 29.4* 31.3* 32.3* 30.0*  MCV 71.5* 72.6* 72.5* 70.7* 71.8*  PLT 197 225 227 266 246   Cardiac Enzymes:  Recent Labs Lab  08/30/15 2215 08/31/15 0319 08/31/15 1015 08/31/15 1509  TROPONINI 0.05* 0.03 <0.03 0.03   BNP (last 3 results)  Recent Labs  08/26/15 1307  BNP 451.8*    ProBNP (last 3 results) No results for input(s): PROBNP in the last 8760 hours.  CBG:  Recent Labs Lab 08/28/15 2039 08/28/15 2342 08/29/15 0428 08/29/15 0737 08/29/15 1112  GLUCAP 130* 95 123* 75 112*    Recent Results (from the past 240 hour(s))  MRSA PCR Screening     Status: None   Collection Time: 08/27/15  6:08 AM  Result Value Ref Range Status   MRSA by PCR NEGATIVE NEGATIVE Final    Comment:        The GeneXpert MRSA Assay (FDA approved for NASAL specimens only), is one component of a comprehensive MRSA colonization surveillance program. It is not intended to diagnose MRSA infection nor to guide or monitor treatment for MRSA  infections.   Respiratory virus panel     Status: None   Collection Time: 08/27/15 12:38 PM  Result Value Ref Range Status   Source - RVPAN NASAL SWAB  Corrected   Respiratory Syncytial Virus A Negative Negative Final   Respiratory Syncytial Virus B Negative Negative Final   Influenza A Negative Negative Final   Influenza B Negative Negative Final   Parainfluenza 1 Negative Negative Final   Parainfluenza 2 Negative Negative Final   Parainfluenza 3 Negative Negative Final   Metapneumovirus Negative Negative Final   Rhinovirus Negative Negative Final   Adenovirus Negative Negative Final    Comment: (NOTE) Performed At: [facility] [address] [place], [place] [id number] Mila Homer MD MG:[id number]      Studies: No results found.  Scheduled Meds: . aspirin EC  325 mg Oral Daily  . atorvastatin  80 mg Oral q1800  . carvedilol  12.5 mg Oral BID WC  . heparin subcutaneous  5,000 Units Subcutaneous 3 times per day  . isosorbide-hydrALAZINE  0.5 tablet Oral TID  . losartan  25 mg Oral Daily  . sodium chloride  3 mL Intravenous Q12H  . spironolactone  12.5 mg Oral Daily   Continuous Infusions:    Time spent: > 35 minutes   Penny Pia  [facility] Pager [phone] If 7PM-7AM, please contact night-coverage at [email or web address], password [facility] 09/03/2015, 4:33 PM  LOS: 3 days

## 2015-09-03 NOTE — Progress Notes (Signed)
Physical Therapy Treatment Patient Details Name: Julie Stein MRN: [id number] DOB: 20-Jan-1935 Today's Date: 09/03/2015    History of Present Illness Ms Mcmanama is a 80 year old with a history of TIA, HTN, CAD, SVT and anemia readmitted from inpatient rehab 08/31/15 with syncopal episode requiring brief CPR. They were working on coordination, balance, gait at CIR; worth noting, pt HR increased to 170s and 180s post getting up on 10/16    PT Comments    PT treatment session today focused on bed mobility, functional transfers, and orthostatic measuring.  Pt with significant posterior and some L lateral lean requiring UE support and physical assistance to maintain upright.  Pt was negative for orthostatic hypotension (supine 135/80, sitting 156/94, standing 154/80) and HR was stable throughout session.  Pt and family educated on progress with therapy.  Continue to recommend CIR upon discharge.   Follow Up Recommendations  CIR;Supervision/Assistance - 24 hour     Equipment Recommendations  Rolling walker with 5" wheels;3in1 (PT)    Recommendations for Other Services       Precautions / Restrictions Precautions Precautions: Fall Restrictions Weight Bearing Restrictions: No    Mobility  Bed Mobility Overal bed mobility: Needs Assistance Bed Mobility: Supine to Sit     Supine to sit: Supervision        Transfers Overall transfer level: Needs assistance Equipment used: 1 person hand held assist Transfers: Sit to/from Stand Sit to Stand: Min assist;+2 safety/equipment         General transfer comment: 2nd person for equipment. Pt with posterior sway.  Ambulation/Gait Ambulation/Gait assistance: +2 physical assistance Ambulation Distance (Feet): 5 Feet Assistive device: 2 person hand held assist       General Gait Details: Pt took a few steps forward and back but gait deferred due to posterior and L lateral lean as well as L LE buckling.   Stairs             Wheelchair Mobility    Modified Rankin (Stroke Patients Only)       Balance Overall balance assessment: Needs assistance Sitting-balance support: No upper extremity supported;Feet supported Sitting balance-Leahy Scale: Good     Standing balance support: Bilateral upper extremity supported Standing balance-Leahy Scale: Poor Standing balance comment: pt with posterior and L lateral lean                    Cognition Arousal/Alertness: Awake/alert Behavior During Therapy: WFL for tasks assessed/performed Overall Cognitive Status: Within Functional Limits for tasks assessed                      Exercises      General Comments General comments (skin integrity, edema, etc.): Pt unstable in standing with posterior and L lateral lean.  Orthostatics checked and no orthostatic hypotension noted (135/80 supine, 156/94 sitting EOB, 154/80 standing).  HR also stable throughout.       Pertinent Vitals/Pain Pain Assessment: No/denies pain    Home Living Family/patient expects to be discharged to:: Inpatient rehab Living Arrangements: Spouse/significant other;Children Available Help at Discharge: Family;Available 24 hours/day (grandson and his wife in school during day, but other family members able to assist ) Type of Home: House Home Access: Stairs to enter Entrance Stairs-Rails: Right;Left;Can reach both Home Layout: One level Home Equipment: None Additional Comments: Pt was gardening, doing yard work, cleaned house - was fully independent PTA.     Prior Function Level of Independence: Independent (prior to initial admission)  Comments: enjoys gardening   PT Goals (current goals can now be found in the care plan section) Acute Rehab PT Goals Patient Stated Goal: to be independent again PT Goal Formulation: With patient Time For Goal Achievement: 09/26/15 Potential to Achieve Goals: Good Progress towards PT goals: Progressing toward goals     Frequency  Min 4X/week    PT Plan Current plan remains appropriate    Co-evaluation PT/OT/SLP Co-Evaluation/Treatment: Yes Reason for Co-Treatment: For patient/therapist safety PT goals addressed during session: Mobility/safety with mobility OT goals addressed during session: ADL's and self-care     End of Session Equipment Utilized During Treatment: Gait belt Activity Tolerance: Patient tolerated treatment well Patient left: in bed;with call bell/phone within reach;with family/visitor present     Time: 1610-9604 PT Time Calculation (min) (ACUTE ONLY): 26 min  Charges:  $Therapeutic Activity: 8-22 mins                    G Codes:      Herald Vallin 10/02/2015, 2:19 PM  Arnoldo Morale, SPT

## 2015-09-03 NOTE — Progress Notes (Signed)
Rehab admissions - Patient well known to me from previous inpatient rehab admission last week.  I met with patient and her grand daughter today.  Patient does want to return to inpatient rehab once she is medically stable.  Noted patient needs ablation procedure.  Not medically ready yet for inpatient rehab admission.  Will follow along daily.  Call me for questions.  #[phone]

## 2015-09-03 NOTE — Progress Notes (Signed)
Pt c/o back pain.  MD paged for an order for Kpad.  Will continue to monitor.

## 2015-09-03 NOTE — Consult Note (Signed)
Cardiologist:  Aundra Dubin  Reason for Consult: Possible AV node ablation for SVT Referring Physician: Lailoni Baquera is an 80 y.o. female.  HPI:   The patient is a 80 yo female with a history of SVT, HTN, NISCM, CHF and stroke.  She was admitted initially 10/6 with pulmonary edema.  Echo on 10/9 revealed an EF of 20-25% with diffuse hypokinesis.  It was thought she had a viral myocarditis or hypertensive CM.  She had right and left heart caths on 10/10 revealing normal to low filling pressures and normal cardiac output, moderate to severe ostial OM1 and severe distal LAD stenoses with plan for medical mgt. Dr. Aundra Dubin thought her flash pulm edema was likely from htn crisis leading to demand ischemia.  Her SVT was treated with IV Amiodarone and later discontinued.      She was readmitted from IP rehab with chest pressure which lasted one hour and spontaneously resolved.  She then had a syncopal episode later in the evening while urinating which lasted < one min.  She was given chest compression for a short duration and regained a pulse.  She had another episode of SVT last night at 1700hrs with  Conversion after being started on IV diltiazem.  This was DCd this morning.   She has frequent PACs and PVCs.      The patient reports not having any symptoms yesterday during the SVT; no dizziness, presyncope, CP, SOB.  She also The patient currently denies nausea, vomiting, fever, orthopnea, PND, cough, congestion, abdominal pain, hematochezia, melena, lower extremity edema.    Cath report, 08/27/15 Coronary Findings    Dominance: Left   Left Main  Short vessel, no significant disease.     Left Anterior Descending  20% proximal LAD, 40% mid LAD, 90% distal LAD stenoses.     Left Circumflex  There was a large high OM1 with 80% ostial stenosis. The AV LCx was a large, dominant vessel with luminal irregularities.     Right Coronary Artery  Small, nondominant vessel with no significant disease.        Right Heart Pressures RHC Procedural Findings: Hemodynamics (mmHg) RA mean 2 RV 19/6 PA 23/11 PCWP mean 8 LV 128/15 AO 126/68  Oxygen saturations: PA 70% AO 99%  Cardiac Output (Fick) 3.09  Cardiac Index (Fick) 4.95    Past Medical History  Diagnosis Date  . Hypertension   . CHF (congestive heart failure) ([place])   . Stroke [facility])     Past Surgical History  Procedure Laterality Date  . Cardiac catheterization N/A 08/27/2015    Procedure: Right/Left Heart Cath and Coronary Angiography;  Surgeon: Larey Dresser, MD;  Location: [facility];  Service: Cardiovascular;  Laterality: N/A;  . Knee surgery      Family History  Problem Relation Age of Onset  . Hypertension Daughter     Social History:  reports that she has never smoked. She does not have any smokeless tobacco history on file. She reports that she does not drink alcohol or use illicit drugs.  Allergies:  Allergies  Allergen Reactions  . Demerol [Meperidine]     intolerance    Medications:  Scheduled Meds: . aspirin EC  325 mg Oral Daily  . atorvastatin  80 mg Oral q1800  . carvedilol  12.5 mg Oral BID WC  . heparin subcutaneous  5,000 Units Subcutaneous 3 times per day  . isosorbide-hydrALAZINE  0.5 tablet Oral TID  . losartan  25 mg  Oral Daily  . sodium chloride  3 mL Intravenous Q12H  . spironolactone  12.5 mg Oral Daily   Continuous Infusions:  PRN Meds:.acetaminophen **OR** acetaminophen, nitroGLYCERIN, ondansetron **OR** ondansetron (ZOFRAN) IV   Results for orders placed or performed during the hospital encounter of 08/31/15 (from the past 48 hour(s))  Basic metabolic panel     Status: Abnormal   Collection Time: 09/02/15  4:02 AM  Result Value Ref Range   Sodium 137 135 - 145 mmol/L   Potassium 3.8 3.5 - 5.1 mmol/L   Chloride 102 101 - 111 mmol/L   CO2 26 22 - 32 mmol/L   Glucose, Bld 111 (H) 65 - 99 mg/dL   BUN 15 6 - 20 mg/dL   Creatinine, Ser 1.05 (H) 0.44 - 1.00  mg/dL   Calcium 8.9 8.9 - 10.3 mg/dL   GFR calc non Af Amer 50 (L) >60 mL/min   GFR calc Af Amer 57 (L) >60 mL/min    Comment: (NOTE) The eGFR has been calculated using the CKD EPI equation. This calculation has not been validated in all clinical situations. eGFR's persistently <60 mL/min signify possible Chronic Kidney Disease.    Anion gap 9 5 - 15  CBC     Status: Abnormal   Collection Time: 09/02/15  4:02 AM  Result Value Ref Range   WBC 7.9 4.0 - 10.5 K/uL   RBC 4.18 3.87 - 5.11 MIL/uL   Hemoglobin 9.6 (L) 12.0 - 15.0 g/dL   HCT 30.0 (L) 36.0 - 46.0 %   MCV 71.8 (L) 78.0 - 100.0 fL   MCH 23.0 (L) 26.0 - 34.0 pg   MCHC 32.0 30.0 - 36.0 g/dL   RDW 15.8 (H) 11.5 - 15.5 %   Platelets 246 150 - 400 K/uL  Basic metabolic panel     Status: Abnormal   Collection Time: 09/03/15  3:20 AM  Result Value Ref Range   Sodium 136 135 - 145 mmol/L   Potassium 3.7 3.5 - 5.1 mmol/L   Chloride 102 101 - 111 mmol/L   CO2 27 22 - 32 mmol/L   Glucose, Bld 108 (H) 65 - 99 mg/dL   BUN 12 6 - 20 mg/dL   Creatinine, Ser 1.03 (H) 0.44 - 1.00 mg/dL   Calcium 8.9 8.9 - 10.3 mg/dL   GFR calc non Af Amer 51 (L) >60 mL/min   GFR calc Af Amer 59 (L) >60 mL/min    Comment: (NOTE) The eGFR has been calculated using the CKD EPI equation. This calculation has not been validated in all clinical situations. eGFR's persistently <60 mL/min signify possible Chronic Kidney Disease.    Anion gap 7 5 - 15    No results found.  Review of Systems  Constitutional: Negative for fever and diaphoresis.  HENT: Negative for congestion.   Respiratory: Negative for shortness of breath.   Cardiovascular: Negative for chest pain, palpitations, orthopnea, leg swelling and PND.  Gastrointestinal: Negative for nausea, vomiting, abdominal pain, blood in stool and melena.  Genitourinary: Negative for hematuria.  Musculoskeletal: Negative for myalgias.  Neurological: Negative for dizziness and weakness.  All other  systems reviewed and are negative.  Blood pressure 132/71, pulse 75, temperature 98.8 F (37.1 C), temperature source Oral, resp. rate 16, height 4' 11" (1.499 m), weight 106 lb 11.2 oz (48.399 kg), SpO2 99 %. Physical Exam  Nursing note and vitals reviewed. Constitutional: She is oriented to person, place, and time. She appears well-developed and well-nourished. No distress.  HENT:  Head: Normocephalic and atraumatic.  Eyes: EOM are normal. Pupils are equal, round, and reactive to light. No scleral icterus.  Neck: Normal range of motion. Neck supple. No JVD present.  Cardiovascular: Normal rate and S1 normal.  An irregularly irregular rhythm present.  No murmur heard. Pulses:      Radial pulses are 2+ on the right side, and 2+ on the left side.       Dorsalis pedis pulses are 2+ on the right side, and 2+ on the left side.  Respiratory: Effort normal and breath sounds normal. She has no wheezes. She has no rales.  GI: Soft. Bowel sounds are normal. She exhibits no distension. There is no tenderness.  Musculoskeletal: She exhibits no edema (No LEE).  Neurological: She is alert and oriented to person, place, and time. She exhibits normal muscle tone.  Skin: Skin is warm and dry.  Psychiatric: She has a normal mood and affect.    Assessment/Plan: Active Problems:   Hypertension   Syncope   Chronic systolic heart failure (HCC)   Microcytic hypochromic anemia   Chest pain   SVT   CAD   CVA  80 yo female with a history of SVT, HTN, NISCM, CHF and stroke.  She was admitted initially 10/6 with pulmonary edema.  Echo on 10/9 revealed an EF of 20-25% with diffuse hypokinesis.  It was thought she had a viral myocarditis or hypertensive CM.  She had right and left heart caths on 10/10 revealing normal to low filling pressures and normal cardiac output, moderate to severe ostial OM1 and severe distal LAD stenoses with plan for medical mgt. Dr. Aundra Dubin thought her flash pulm edema was likely from  htn crisis leading to demand ischemia.  Her SVT was treated with IV Amiodarone and later discontinued.  She presented from IP rehab on 10/14 with syncopal episode during urination and had SVT yesterday evening.  She was briefly treated with IV dilt.  She is on Coreg 12.20m BID.  Dr. CCurt Bearsto evaluate for AVN ablation.  NPO now.  She had breakfast.  HAGER, BRYAN, PAC 09/03/2015, 8:10 AM   I have seen and examined this patient with BTarri Fuller  Agree with above, note added to reflect my findings.  On exam, regular rhythm, no murmurs, lungs clear.  Has narrow complex tachycardia that has recurred.  No obvious P waves, could be AVNRT vs AT less likely.  Discussed ablation with the patient and she is agreeable.  Told of risks and benefits.  Treyveon Mochizuki look for a time to do the procedure.  Gao Mitnick M. Quetzali Heinle MD 09/03/2015 10:05 AM

## 2015-09-03 NOTE — Progress Notes (Signed)
CSW contacted by Roderic Palau, RN Liaison- CIR this morning who indicated that patient is a current patient on CIR and was transferred to the hospital on 08/31/15.  Current plan is for patient to return to CIR when medically stable. CSW notified Steward [facility]- [facility] of above.  Lorri Frederick. Jaci Lazier, [place] [phone]

## 2015-09-03 NOTE — H&P (Signed)
Physical Medicine and Rehabilitation Admission H&P    Chief complaint: Weakness  HPI: Julie Stein is a 80 y.o. right handed female history of hypertension. Patient lives with grandson and his wife large extended supportive family. Independent prior to admission and active. One level home 6 steps to entry. Presented 08/26/2015 with sudden onset of shortness of breath noted recent bouts of diarrhea, congestion and cough. Chest x-ray shows significant pulmonary edema. EKG with frequent PVCs bigeminy 2 runs of SVT heart rate 200 bpm. She became hypotensive placed on amiodarone drip. Bedside echocardiogram with ejection fraction 20-25% diffuse hypokinesis, RV functioning well. Placed on broad-spectrum antibiotic suspect viral illness. Troponin mildly elevated 0.22 days 0.29. Cardiology service is consulted and underwent cardiac catheterization showing severe distal LAD stenosis moderate ostial large OMI stenosis. Advised medical management. Post catheterization noted left-sided weakness. MRI of the brain showed small volume patchy cortical and subcortical ischemic infarcts within the left frontal and right frontoparietal region with additional tiny cortical infarcts within the right occipital lobe. MRA of the head with no major vessel occlusion or stenosis. Placed on aspirin for CVA prophylaxis. Subcutaneous heparin for DVT prophylaxis. Physical therapy evaluation completed 08/29/2015 with recommendations of physical medicine rehabilitation consult. Patient was admitted for comprehensive rehabilitation program 08/29/2015. On 08/31/2015 developed some nonspecific chest pressure lasting perhaps an hour and resolve spontaneously. Later she was up to the bathroom urinating had a syncopal event noted asystole lasted approximately less than a minute. CODE BLUE was called was given at least 2-3 minutes at bedtime chest compressions and regained pulse became more alert and awake. She did not recall the incident. She  was discharged to acute care services for ongoing monitoring and workup. Troponin was 0.05. Cardiology follow-up. Chest x-ray showed questionable infiltrate right lower lobe probable right small pleural effusion similar to prior. EKG SVT with frequent PVCs. Question vagal episode associated with micturition versus arrhythmia versus orthostasis. Ongoing bouts of tachycardia SVT heart rate had been in the 180s and had been maintained on intravenous Cardizem.Lottie Rater EP study 09/05/2015 unable to trigger an arrhythmia. Rate has been controlled and advised to continue Coreg for now. Therapies have been reinitiated with progressive gains. Patient is admitted for comprehensive rehabilitation program.  ROS ROS Review of Systems  Constitutional: Negative for fever and chills.  HENT: Negative for hearing loss.  Eyes: Positive for blurred vision. Negative for double vision.  Respiratory: Positive for shortness of breath.  Cardiovascular: Negative for chest pain and palpitations.  Gastrointestinal: Negative for vomiting.  Genitourinary: Negative for dysuria and hematuria.  Skin: Negative for rash.  Neurological: Negative for seizures, loss of consciousness and headaches.  All other systems reviewed and are negative Past Medical History  Diagnosis Date  . Hypertension   . CHF (congestive heart failure) (HCC)   . Stroke [facility])    Past Surgical History  Procedure Laterality Date  . Cardiac catheterization N/A 08/27/2015    Procedure: Right/Left Heart Cath and Coronary Angiography;  Surgeon: Laurey Morale, MD;  Location: [facility] INVASIVE CV LAB;  Service: Cardiovascular;  Laterality: N/A;  . Knee surgery     Family History  Problem Relation Age of Onset  . Hypertension Daughter    Social History:  reports that she has never smoked. She does not have any smokeless tobacco history on file. She reports that she does not drink alcohol or use illicit drugs. Allergies:  Allergies  Allergen  Reactions  . Demerol [Meperidine]     intolerance   Medications  Prior to Admission  Medication Sig Dispense Refill  . aspirin EC 325 MG EC tablet Take 1 tablet (325 mg total) by mouth daily. 30 tablet 6  . atorvastatin (LIPITOR) 80 MG tablet Take 1 tablet (80 mg total) by mouth daily at 6 PM. 30 tablet 6  . azithromycin (ZITHROMAX) 250 MG tablet Take 1 tablet (250 mg total) by mouth daily. 3 each 0  . carvedilol (COREG) 12.5 MG tablet Take 1 tablet (12.5 mg total) by mouth 2 (two) times daily with a meal. 60 tablet 6  . COLLAGEN PO Take 1 capsule by mouth daily.    . isosorbide-hydrALAZINE (BIDIL) 20-37.5 MG tablet Take 1 tablet by mouth 3 (three) times daily. 90 tablet 6  . Multiple Vitamins-Minerals (MULTIVITAMIN) tablet Take 1 tablet by mouth daily.    . sacubitril-valsartan (ENTRESTO) 24-26 MG Take 1 tablet by mouth 2 (two) times daily. 60 tablet 6    Home: Home Living Family/patient expects to be discharged to:: Inpatient rehab Living Arrangements: Spouse/significant other, Children Available Help at Discharge: Family, Available 24 hours/day (grandson and his wife in school during day, but other family members able to assist ) Type of Home: House Home Access: Stairs to enter [employer] of Steps: 6 Entrance Stairs-Rails: Right, Left, Can reach both Home Layout: One level Bathroom Shower/Tub: Gaffer, Architectural technologist: Standard Home Equipment: None Additional Comments: Pt was gardening, doing yard work, cleaned house - was fully independent PTA.    Functional History: Prior Function Level of Independence: Independent (prior to initial admission) Comments: enjoys gardening  Functional Status:  Mobility: Bed Mobility Overal bed mobility: Needs Assistance Bed Mobility: Supine to Sit Supine to sit: Supervision General bed mobility comments: Cues to initiate; supervision for safety Transfers Overall transfer level: Needs assistance Equipment used:  2 person hand held assist Transfers: Sit to/from Stand Sit to Stand: Mod assist, +2 physical assistance General transfer comment: Noted adequate strenght for power-up, but decr coordination, trunk instability, and difficulty controlling center of mass over base of support Ambulation/Gait Ambulation/Gait assistance: +2 physical assistance, Mod assist Ambulation Distance (Feet): 12 Feet Assistive device: 2 person hand held assist Gait Pattern/deviations: Ataxic General Gait Details: Needing bil UE support for stability in upright standing and walking activities    ADL:    Cognition: Cognition Overall Cognitive Status: Within Functional Limits for tasks assessed Orientation Level: Oriented X4 Cognition Arousal/Alertness: Awake/alert Behavior During Therapy: WFL for tasks assessed/performed Overall Cognitive Status: Within Functional Limits for tasks assessed  Physical Exam: Blood pressure 126/67, pulse 75, temperature 98.8 F (37.1 C), temperature source Oral, resp. rate 16, height $RemoveBe'4\' 11"'gTgftvhVa$  (1.499 m), weight 48.399 kg (106 lb 11.2 oz), SpO2 99 %. Physical Exam Constitutional: She is oriented to person, place, and time. She appears well-developed and well-nourished.  HENT:  Head: Normocephalic and atraumatic.  Eyes: Conjunctivae and EOM are normal.  Negative nystagmus  Neck: Normal range of motion. Neck supple. No thyromegaly present.  Cardiovascular: Normal rate and regular rhythm.  Respiratory: Effort normal and breath sounds normal. No respiratory distress.  GI: Soft. Bowel sounds are normal. She exhibits no distension. There is no tenderness.  Musculoskeletal: She exhibits no edema.  PROM WNL B/l UE 4+/5 grossly RLE 4+/5 grossly LLE hip flex 4/5, ankle dorsi/plantar flexion 4+/5  Neurological: She is alert and oriented to person, place, and time. She has normal reflexes.  Makes good eye contact with examiner and follows full commands. Fair awareness of deficits Left  trunk lean Sensation to  light touch grossly intact throughout  Skin: Skin is warm and dry.  Psychiatric: She has a normal mood and affect. Her behavior is normal   Results for orders placed or performed during the hospital encounter of 08/31/15 (from the past 48 hour(s))  Basic metabolic panel     Status: Abnormal   Collection Time: 09/02/15  4:02 AM  Result Value Ref Range   Sodium 137 135 - 145 mmol/L   Potassium 3.8 3.5 - 5.1 mmol/L   Chloride 102 101 - 111 mmol/L   CO2 26 22 - 32 mmol/L   Glucose, Bld 111 (H) 65 - 99 mg/dL   BUN 15 6 - 20 mg/dL   Creatinine, Ser 1.05 (H) 0.44 - 1.00 mg/dL   Calcium 8.9 8.9 - 10.3 mg/dL   GFR calc non Af Amer 50 (L) >60 mL/min   GFR calc Af Amer 57 (L) >60 mL/min    Comment: (NOTE) The eGFR has been calculated using the CKD EPI equation. This calculation has not been validated in all clinical situations. eGFR's persistently <60 mL/min signify possible Chronic Kidney Disease.    Anion gap 9 5 - 15  CBC     Status: Abnormal   Collection Time: 09/02/15  4:02 AM  Result Value Ref Range   WBC 7.9 4.0 - 10.5 K/uL   RBC 4.18 3.87 - 5.11 MIL/uL   Hemoglobin 9.6 (L) 12.0 - 15.0 g/dL   HCT 30.0 (L) 36.0 - 46.0 %   MCV 71.8 (L) 78.0 - 100.0 fL   MCH 23.0 (L) 26.0 - 34.0 pg   MCHC 32.0 30.0 - 36.0 g/dL   RDW 15.8 (H) 11.5 - 15.5 %   Platelets 246 150 - 400 K/uL  Basic metabolic panel     Status: Abnormal   Collection Time: 09/03/15  3:20 AM  Result Value Ref Range   Sodium 136 135 - 145 mmol/L   Potassium 3.7 3.5 - 5.1 mmol/L   Chloride 102 101 - 111 mmol/L   CO2 27 22 - 32 mmol/L   Glucose, Bld 108 (H) 65 - 99 mg/dL   BUN 12 6 - 20 mg/dL   Creatinine, Ser 1.03 (H) 0.44 - 1.00 mg/dL   Calcium 8.9 8.9 - 10.3 mg/dL   GFR calc non Af Amer 51 (L) >60 mL/min   GFR calc Af Amer 59 (L) >60 mL/min    Comment: (NOTE) The eGFR has been calculated using the CKD EPI equation. This calculation has not been validated in all clinical  situations. eGFR's persistently <60 mL/min signify possible Chronic Kidney Disease.    Anion gap 7 5 - 15   No results found.     Medical Problem List and Plan: 1. Functional deficits secondary to watershed pattern infarct likely due to hypoperfusion after initial cardiac catheterization 2.  DVT Prophylaxis/Anticoagulation: Subcutaneous heparin. Monitor platelet counts in any signs of bleeding 3. Pain Management: Tylenol as needed 4. CAD/syncope. Continue aspirin therapy 5. Neuropsych: This patient is capable of making decisions on her own behalf. 6. Skin/Wound Care: Routine skin checks 7. Fluids/Electrolytes/Nutrition: Routine I&O with follow-up chemistries 8. Hypertension/SVT. EP study 09/05/2015 unable to trigger arrhythmia. Coreg 12.5 mg twice a day, Bidil 20-37.5 mg 3 times a day, Cozaar 25 mg daily, Aldactone 12.5 mg daily. Monitor of increased mobility 9. Hyperlipidemia. Lipitor    Post Admission Physician Evaluation: 1. 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 . 2. Patient is admitted to receive collaborative, interdisciplinary care between  the physiatrist, rehab nursing staff, and therapy team. 3. Patient's level of medical complexity and substantial therapy needs in context of that medical necessity cannot be provided at a lesser intensity of care such as a SNF. 4. Patient has experienced substantial functional loss from his/her baseline which was documented above under the "Functional History" and "Functional Status" headings.  Judging by the patient's diagnosis, physical exam, and functional history, the patient has potential for functional progress which will result in measurable gains while on inpatient rehab.  These gains will be of substantial and practical use upon discharge  in facilitating mobility and self-care at the household level. 5. Physiatrist will provide 24 hour management of medical needs as well as oversight of the therapy  plan/treatment and provide guidance as appropriate regarding the interaction of the two. 6. 24 hour rehab nursing will assist with safety, disease management, medication administration, pain management and patient education, and help integrate therapy concepts, techniques,education, etc. 1. PT will assess and treat for/with: Lower extremity strength, range of motion, stamina, balance, functional mobility, safety, adaptive techniques and equipment, woundcare, coping skills, pain control, and stroke education. Goals are: Supervision. 7. OT will assess and treat for/with: ADL's, functional mobility, safety, upper extremity strength, adaptive techniques and equipment, wound mgt, ego support, and, stroke education, and community reintegration. Goals are: Supervision/Mod I. Therapy may proceed with showering this patient. 8. Case Management and Social Worker will assess and treat for psychological issues and discharge planning. 9. Team conference will be held weekly to assess progress toward goals and to determine barriers to discharge. 10. Patient will receive at least 3 hours of therapy per day at least 5 days per week. 11. ELOS: ~7 days.       12. Prognosis:  good  Delice Lesch, MD  09/03/2015

## 2015-09-04 ENCOUNTER — Encounter (HOSPITAL_COMMUNITY): Payer: Self-pay | Admitting: Physician Assistant

## 2015-09-04 LAB — BASIC METABOLIC PANEL
ANION GAP: 8 (ref 5–15)
BUN: 11 mg/dL (ref 6–20)
CALCIUM: 9.3 mg/dL (ref 8.9–10.3)
CO2: 29 mmol/L (ref 22–32)
Chloride: 105 mmol/L (ref 101–111)
Creatinine, Ser: 1.14 mg/dL — ABNORMAL HIGH (ref 0.44–1.00)
GFR, EST AFRICAN AMERICAN: 52 mL/min — AB (ref >60)
GFR, EST NON AFRICAN AMERICAN: 45 mL/min — AB (ref >60)
GLUCOSE: 89 mg/dL (ref 65–99)
POTASSIUM: 3.4 mmol/L — AB (ref 3.5–5.1)
Sodium: 142 mmol/L (ref 135–145)

## 2015-09-04 MED ORDER — ASPIRIN EC 81 MG PO TBEC
81.0000 mg | DELAYED_RELEASE_TABLET | Freq: Every day | ORAL | Status: DC
  Administered 2015-09-05 – 2015-09-06 (×2): 81 mg via ORAL
  Filled 2015-09-04 (×2): qty 1

## 2015-09-04 MED ORDER — POTASSIUM CHLORIDE CRYS ER 20 MEQ PO TBCR
40.0000 meq | EXTENDED_RELEASE_TABLET | Freq: Once | ORAL | Status: AC
  Administered 2015-09-04: 40 meq via ORAL
  Filled 2015-09-04: qty 2

## 2015-09-04 MED ORDER — SPIRONOLACTONE 25 MG PO TABS
25.0000 mg | ORAL_TABLET | Freq: Every day | ORAL | Status: DC
  Administered 2015-09-04 – 2015-09-06 (×3): 25 mg via ORAL
  Filled 2015-09-04 (×3): qty 1

## 2015-09-04 NOTE — Progress Notes (Signed)
SUBJECTIVE: The patient is doing well this morning, her daughter is at bedside.  At this time, she denies chest pain, shortness of breath, or any new concerns.  Marland Kitchen aspirin EC  325 mg Oral Daily  . atorvastatin  80 mg Oral q1800  . carvedilol  12.5 mg Oral BID WC  . heparin subcutaneous  5,000 Units Subcutaneous 3 times per day  . isosorbide-hydrALAZINE  0.5 tablet Oral TID  . losartan  25 mg Oral Daily  . sodium chloride  3 mL Intravenous Q12H  . spironolactone  25 mg Oral Daily      OBJECTIVE: Physical Exam: Filed Vitals:   09/03/15 1140 09/03/15 1716 09/03/15 2042 09/04/15 0603  BP:  146/81 138/81 109/71  Pulse: 77 78 83 85  Temp: 98.1 F (36.7 C) 98.2 F (36.8 C) 98 F (36.7 C) 98.5 F (36.9 C)  TempSrc: Oral Oral Oral Oral  Resp:  18 18 19   Height:      Weight:    106 lb 3.2 oz (48.172 kg)  SpO2: 100% 99% 98% 100%    Intake/Output Summary (Last 24 hours) at 09/04/15 0949 Last data filed at 09/04/15 0752  Gross per 24 hour  Intake    240 ml  Output    750 ml  Net   -510 ml    Telemetry reveals sinus rhythm, no SVT noted  GEN- The patient is thin, frail appearing, alert and oriented x 3 today.   Head- normocephalic, atraumatic Eyes-  Sclera clear, conjunctiva pink Ears- hearing intact Neck- supple, no JVP Lungs- Clear to ausculation bilaterally, normal work of breathing Heart- Regular rate and rhythm, no significant murmurs, rubs or gallops, PMI not laterally displaced GI- soft, NT, ND, + BS Extremities- no clubbing, cyanosis, or edema Skin- no rash or lesion Psych- euthymic mood, full affect   LABS: Basic Metabolic Panel:  Recent Labs  93/26/71 0320 09/04/15 0310  NA 136 142  K 3.7 3.4*  CL 102 105  CO2 27 29  GLUCOSE 108* 89  BUN 12 11  CREATININE 1.03* 1.14*  CALCIUM 8.9 9.3   CBC:  Recent Labs  09/02/15 0402  WBC 7.9  HGB 9.6*  HCT 30.0*  MCV 71.8*  PLT 246    RADIOLOGY:  Dg Chest 2 View 08/31/2015  CLINICAL DATA:   Dyspnea. EXAM: CHEST  2 VIEW COMPARISON:  August 31, 2015. FINDINGS: Stable cardiomediastinal silhouette. No pneumothorax is noted. Stable right basilar opacity is noted concerning for pneumonia or atelectasis with associated pleural effusion. Mild left basilar subsegmental atelectasis is noted. Bony thorax is unremarkable. IMPRESSION: Stable right basilar opacity is noted concerning for pneumonia or atelectasis with associated pleural effusion. Mild left basilar subsegmental atelectasis is noted as well. Electronically Signed   By: Lupita Raider, M.D.   On: 08/31/2015 11:00   08/27/15: [facility] 1. Normal to low filling pressures and normal cardiac output.  2. Moderate to severe ostial OM1 and severe distal LAD stenoses.  I suspect that the etiology of her flash pulmonary edema was a hypertensive crisis leading to demand ischemia mediated by fixed stenoses in the ostial OM1 and distal LAD. Neither lesions appeared acute. She is doing much better. I would aim for medical management for now, can intervene on the OM1 if she has chest pain or flash pulmonary edema. I think that the etiology of her cardiomyopathy is likely poorly controlled HTN rather than the CAD  08/26/15: Echocardiogram Impressions: - Left ventricle has normal size with  mild concentric LVH and severely decreased systolic function, LVEF 20-25%. There is diffuse hypokinesis.  The fact that there is only grade I diastolic dysfunction with normal left atrial size and LV has normal size is suggestive of an acute process rather than a chronic condition.   ASSESSMENT AND PLAN:  1. Syncope: Occurred while on bedside commode. Required brief CPR. No meds required. Possible vagal episode associated with micturation versus arrhythmia, unfortunately, she was not on telemetry when event occurred.  - She has h/o SVT: Required amiodarone briefly on a prior admission, this admit back in SVT and briefly on diltiazem gtt with  conversion to NSR. No further SVT has been observed.  There is question if rapid SVT in setting of low EF contributed to her syncopal event +/- hypotension. We Sonu Kruckenberg plan for SVT ablation tomorrow.  Continue with CHF and primary care team:  2. Chronic Systolic HF: Primarily nonischemic cardiomyopathy.  3. CAD 4. CVA 5. H/o SVT 6. HTN   Renee Norberto Sorenson, [place] 09/04/2015 9:49 AM  I have seen and examined this patient with Francis Dowse.  Agree with above, note added to reflect my findings.  On exam, regular rhythm, no mumurs, lungs clear.  Has not had arrhythmia overnight.  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  Braedon Sjogren plan for ablation tomorrow.  Discussed risks and benefits with the patient and she agrees.      Shaleigh Laubscher M. Lavona Norsworthy MD 09/04/2015 10:02 AM

## 2015-09-04 NOTE — Care Management Important Message (Signed)
Important Message  Patient Details  Name: Julie Stein MRN: [id number] Date of Birth: 1935/06/25   Medicare Important Message Given:  Yes-second notification given    Orson Aloe 09/04/2015, 3:06 PM

## 2015-09-04 NOTE — Progress Notes (Signed)
Patient ID: Julie Stein, female   DOB: 1935/01/26, 80 y.o.   MRN: [id number]   SUBJECTIVE:  No more SVT.  Continues to work with PT/OT, says she lacks coordination in left leg.  Denies SOB/Orthopnea.    Scheduled Meds: . aspirin EC  325 mg Oral Daily  . atorvastatin  80 mg Oral q1800  . carvedilol  12.5 mg Oral BID WC  . heparin subcutaneous  5,000 Units Subcutaneous 3 times per day  . isosorbide-hydrALAZINE  0.5 tablet Oral TID  . losartan  25 mg Oral Daily  . potassium chloride  40 mEq Oral Once  . sodium chloride  3 mL Intravenous Q12H  . spironolactone  25 mg Oral Daily   Continuous Infusions:   PRN Meds:.acetaminophen **OR** acetaminophen, nitroGLYCERIN, ondansetron **OR** ondansetron (ZOFRAN) IV    Filed Vitals:   09/03/15 1140 09/03/15 1716 09/03/15 2042 09/04/15 0603  BP:  146/81 138/81 109/71  Pulse: 77 78 83 85  Temp: 98.1 F (36.7 C) 98.2 F (36.8 C) 98 F (36.7 C) 98.5 F (36.9 C)  TempSrc: Oral Oral Oral Oral  Resp:  18 18 19   Height:      Weight:    106 lb 3.2 oz (48.172 kg)  SpO2: 100% 99% 98% 100%    Intake/Output Summary (Last 24 hours) at 09/04/15 0757 Last data filed at 09/04/15 0752  Gross per 24 hour  Intake    240 ml  Output   1000 ml  Net   -760 ml    LABS: Basic Metabolic Panel:  Recent Labs  86/57/84 0320 09/04/15 0310  NA 136 142  K 3.7 3.4*  CL 102 105  CO2 27 29  GLUCOSE 108* 89  BUN 12 11  CREATININE 1.03* 1.14*  CALCIUM 8.9 9.3   Liver Function Tests: No results for input(s): AST, ALT, ALKPHOS, BILITOT, PROT, ALBUMIN in the last 72 hours. No results for input(s): LIPASE, AMYLASE in the last 72 hours. CBC:  Recent Labs  09/02/15 0402  WBC 7.9  HGB 9.6*  HCT 30.0*  MCV 71.8*  PLT 246   Cardiac Enzymes: No results for input(s): CKTOTAL, CKMB, CKMBINDEX, TROPONINI in the last 72 hours. BNP: Invalid input(s): POCBNP D-Dimer: No results for input(s): DDIMER in the last 72 hours. Hemoglobin A1C: No results for  input(s): HGBA1C in the last 72 hours. Fasting Lipid Panel: No results for input(s): CHOL, HDL, LDLCALC, TRIG, CHOLHDL, LDLDIRECT in the last 72 hours. Thyroid Function Tests: No results for input(s): TSH, T4TOTAL, T3FREE, THYROIDAB in the last 72 hours.  Invalid input(s): FREET3 Anemia Panel: No results for input(s): VITAMINB12, FOLATE, FERRITIN, TIBC, IRON, RETICCTPCT in the last 72 hours.  RADIOLOGY: Dg Chest 2 View  08/31/2015  CLINICAL DATA:  Dyspnea. EXAM: CHEST  2 VIEW COMPARISON:  August 31, 2015. FINDINGS: Stable cardiomediastinal silhouette. No pneumothorax is noted. Stable right basilar opacity is noted concerning for pneumonia or atelectasis with associated pleural effusion. Mild left basilar subsegmental atelectasis is noted. Bony thorax is unremarkable. IMPRESSION: Stable right basilar opacity is noted concerning for pneumonia or atelectasis with associated pleural effusion. Mild left basilar subsegmental atelectasis is noted as well. Electronically Signed   By: Lupita Raider, M.D.   On: 08/31/2015 11:00   Ct Head Wo Contrast  08/27/2015  CLINICAL DATA:  Initial evaluation for acute unresponsiveness. , left-sided weakness, now resolved. EXAM: CT HEAD WITHOUT CONTRAST TECHNIQUE: Contiguous axial images were obtained from the base of the skull through the vertex without intravenous  contrast. COMPARISON:  None. FINDINGS: Age-related cerebral volume loss present. Patchy and confluent hypodensity within the periventricular and deep white matter both cerebral hemispheres most consistent with chronic small vessel ischemic disease. Scattered vascular calcifications within the carotid siphons and distal right vertebral artery. No acute large vessel territory infarct. Gray-white matter differentiation grossly maintained. Deep gray nuclei are symmetric. No acute intracranial hemorrhage. No mass lesion, midline shift, or mass effect. No hydrocephalus. No extra-axial fluid collection. Scalp soft  tissues within normal limits. No acute abnormality about the orbits. Paranasal sinuses mastoid air cells are clear. Calvarium intact. IMPRESSION: 1. No acute intracranial process. 2. Age-related cerebral atrophy with chronic small vessel ischemic disease. Electronically Signed   By: Rise Mu M.D.   On: 08/27/2015 02:31   [name] Wo Contrast  08/28/2015  CLINICAL DATA:  Initial evaluation for transient left-sided weakness following an episode of unresponsiveness and hypotension. EXAM: MRI HEAD WITHOUT CONTRAST MRA HEAD WITHOUT CONTRAST TECHNIQUE: Multiplanar, multiecho pulse sequences of the brain and surrounding structures were obtained without intravenous contrast. Angiographic images of the head were obtained using MRA technique without contrast. COMPARISON:  Prior CT from 08/27/2015. FINDINGS: MRI HEAD FINDINGS Diffuse prominence of the CSF containing spaces is compatible with generalized age-related cerebral atrophy. Patchy and confluent T2/FLAIR hyperintensity within the periventricular and deep white matter both cerebral hemispheres most consistent with chronic small vessel ischemic disease. Small remote lacunar infarct within the periventricular white matter of the left corona radiata. There is a few small foci of patchy restricted diffusion within the high left frontal lobe and right frontoparietal regions bilaterally (series 4, image 33, 35). Additional tiny punctate cortical infarct more inferiorly within the right occipital lobe (series 4, image 26). Associated signal loss seen on corresponding ADC map. No associated hemorrhage or mass effect. Bodies consistent with small acute ischemic infarcts. Given the distribution of these findings, underlying watershed etiology is favored. Normal intravascular flow voids are maintained. Additional scattered subcentimeter foci of susceptibility artifact seen in within the peripheral cortices of the bilateral cerebral hemispheres on gradient echo  sequence, consistent with small chronic micro hemorrhages. While these could potentially be related underlying hypertension, possible amyloid angiopathy could be considered given their somewhat peripheral distribution. No mass lesion, midline shift, or mass effect. No hydrocephalus. No extra-axial fluid collection. Craniocervical junction within normal limits. Mild degenerative spondylolysis noted within the visualized upper cervical spine. Pituitary gland normal. Thinning of the anterior body of the corpus callosum noted, which may related to remote ischemia. No acute abnormality about the orbits. Mild right-sided exophthalmos. Paranasal sinuses are clear. No mastoid effusion. Inner ear structures within normal limits. Bone marrow signal intensity within normal limits. No scalp soft tissue abnormality. MRA HEAD FINDINGS ANTERIOR CIRCULATION: Visualized distal cervical segments of the internal carotid arteries are patent with antegrade flow. Petrous segments widely patent. Scattered multi focal atheromatous irregularity within the cavernous and supraclinoid left ICA. There is a short-segment moderate stenosis at the proximal supraclinoid left ICA (series 5, image 72). Cavernous and supraclinoid right ICA widely patent. Right A1 segment widely patent. Left A1 segment slightly hypoplastic. Anterior communicating artery patent. Atheromatous irregularity within the anterior cerebral arteries bilaterally. M1 segments widely patent without stenosis or occlusion. Atheromatous irregularity within the left M1 segment. MCA bifurcations within normal limits. MCA branches symmetric bilaterally. Distal small vessel disease present within the MCA branches bilaterally. POSTERIOR CIRCULATION: Vertebral arteries patent to the vertebrobasilar junction. Right vertebral artery slightly diminutive. Posterior inferior cerebral arteries not well evaluated on  this exam. Basilar artery widely patent. Small left anterior inferior cerebral  artery noted. Superior cerebellar arteries patent. Both sub posterior cerebral arteries arise from the basilar artery and are well opacified to their distal aspects. Distal small vessel disease within the PCA branches bilaterally. There is a more focal short-segment moderate stenosis within the proximal right P2 segment (series 505, image 15). No aneurysm or vascular malformation. IMPRESSION: MRI HEAD IMPRESSION: 1. Small volume patchy cortical and subcortical ischemic infarcts within the left frontal and right frontoparietal region, with additional tiny cortical infarct within the right occipital lobe. Given the distribution of these infarcts, an underlying watershed etiology is favored. No associated hemorrhage or mass effect. 2. Scattered small chronic micro hemorrhages as above. While these might be related to chronic underlying hypertension, possible amyloid angiopathy could also be considered given their somewhat peripheral distribution. 3. Atrophy with chronic microvascular ischemic disease. MRA HEAD IMPRESSION: 1. No large vessel or proximal arterial branch occlusion identified. 2. Short-segment moderate stenosis within the supraclinoid left ICA. 3. Short-segment moderate stenosis within the proximal right P2 segment. 4. Distal small vessel atheromatous disease within the MCA, ACA, and PCA branches bilaterally Electronically Signed   By: Rise Mu M.D.   On: 08/28/2015 02:26   Mr Brain Wo Contrast  08/28/2015  CLINICAL DATA:  Initial evaluation for transient left-sided weakness following an episode of unresponsiveness and hypotension. EXAM: MRI HEAD WITHOUT CONTRAST MRA HEAD WITHOUT CONTRAST TECHNIQUE: Multiplanar, multiecho pulse sequences of the brain and surrounding structures were obtained without intravenous contrast. Angiographic images of the head were obtained using MRA technique without contrast. COMPARISON:  Prior CT from 08/27/2015. FINDINGS: MRI HEAD FINDINGS Diffuse prominence of  the CSF containing spaces is compatible with generalized age-related cerebral atrophy. Patchy and confluent T2/FLAIR hyperintensity within the periventricular and deep white matter both cerebral hemispheres most consistent with chronic small vessel ischemic disease. Small remote lacunar infarct within the periventricular white matter of the left corona radiata. There is a few small foci of patchy restricted diffusion within the high left frontal lobe and right frontoparietal regions bilaterally (series 4, image 33, 35). Additional tiny punctate cortical infarct more inferiorly within the right occipital lobe (series 4, image 26). Associated signal loss seen on corresponding ADC map. No associated hemorrhage or mass effect. Bodies consistent with small acute ischemic infarcts. Given the distribution of these findings, underlying watershed etiology is favored. Normal intravascular flow voids are maintained. Additional scattered subcentimeter foci of susceptibility artifact seen in within the peripheral cortices of the bilateral cerebral hemispheres on gradient echo sequence, consistent with small chronic micro hemorrhages. While these could potentially be related underlying hypertension, possible amyloid angiopathy could be considered given their somewhat peripheral distribution. No mass lesion, midline shift, or mass effect. No hydrocephalus. No extra-axial fluid collection. Craniocervical junction within normal limits. Mild degenerative spondylolysis noted within the visualized upper cervical spine. Pituitary gland normal. Thinning of the anterior body of the corpus callosum noted, which may related to remote ischemia. No acute abnormality about the orbits. Mild right-sided exophthalmos. Paranasal sinuses are clear. No mastoid effusion. Inner ear structures within normal limits. Bone marrow signal intensity within normal limits. No scalp soft tissue abnormality. MRA HEAD FINDINGS ANTERIOR CIRCULATION: Visualized  distal cervical segments of the internal carotid arteries are patent with antegrade flow. Petrous segments widely patent. Scattered multi focal atheromatous irregularity within the cavernous and supraclinoid left ICA. There is a short-segment moderate stenosis at the proximal supraclinoid left ICA (series 5, image 72). Cavernous and  supraclinoid right ICA widely patent. Right A1 segment widely patent. Left A1 segment slightly hypoplastic. Anterior communicating artery patent. Atheromatous irregularity within the anterior cerebral arteries bilaterally. M1 segments widely patent without stenosis or occlusion. Atheromatous irregularity within the left M1 segment. MCA bifurcations within normal limits. MCA branches symmetric bilaterally. Distal small vessel disease present within the MCA branches bilaterally. POSTERIOR CIRCULATION: Vertebral arteries patent to the vertebrobasilar junction. Right vertebral artery slightly diminutive. Posterior inferior cerebral arteries not well evaluated on this exam. Basilar artery widely patent. Small left anterior inferior cerebral artery noted. Superior cerebellar arteries patent. Both sub posterior cerebral arteries arise from the basilar artery and are well opacified to their distal aspects. Distal small vessel disease within the PCA branches bilaterally. There is a more focal short-segment moderate stenosis within the proximal right P2 segment (series 505, image 15). No aneurysm or vascular malformation. IMPRESSION: MRI HEAD IMPRESSION: 1. Small volume patchy cortical and subcortical ischemic infarcts within the left frontal and right frontoparietal region, with additional tiny cortical infarct within the right occipital lobe. Given the distribution of these infarcts, an underlying watershed etiology is favored. No associated hemorrhage or mass effect. 2. Scattered small chronic micro hemorrhages as above. While these might be related to chronic underlying hypertension, possible  amyloid angiopathy could also be considered given their somewhat peripheral distribution. 3. Atrophy with chronic microvascular ischemic disease. MRA HEAD IMPRESSION: 1. No large vessel or proximal arterial branch occlusion identified. 2. Short-segment moderate stenosis within the supraclinoid left ICA. 3. Short-segment moderate stenosis within the proximal right P2 segment. 4. Distal small vessel atheromatous disease within the MCA, ACA, and PCA branches bilaterally Electronically Signed   By: Rise Mu M.D.   On: 08/28/2015 02:26   Dg Chest Right Decubitus  08/31/2015  CLINICAL DATA:  Dyspnea. EXAM: CHEST - RIGHT DECUBITUS COMPARISON:  Same day. FINDINGS: Right lateral decubitus view of the chest demonstrates mild free flowing right pleural effusion. IMPRESSION: Mild free flowing right pleural effusion. Electronically Signed   By: Lupita Raider, M.D.   On: 08/31/2015 11:27   Dg Chest Port 1 View  08/31/2015  CLINICAL DATA:  Chest pain beginning after physical therapy yesterday. EXAM: PORTABLE CHEST 1 VIEW COMPARISON:  08/28/2015 FINDINGS: Borderline heart size without vascular congestion. Infiltration in the right lung base obscuring the right hemidiaphragm. Probable small right pleural effusion. Appearance suggest pneumonia. Atelectasis in the left lung base is improved since previous study. No pneumothorax. Calcified and tortuous aorta. Mild thoracic scoliosis convex towards the right. IMPRESSION: Infiltration in the right lung base with probable small right pleural effusion. Findings suggest pneumonia. Electronically Signed   By: Burman Nieves M.D.   On: 08/31/2015 03:21   Dg Chest Port 1 View  08/28/2015  CLINICAL DATA:  Shortness of breath.  Pulmonary edema. EXAM: PORTABLE CHEST 1 VIEW COMPARISON:  08/26/2015. FINDINGS: Left IJ line in stable position. Cardiomegaly with bilateral pulmonary alveolar infiltrates. Slight improvement. Persistent small pleural effusions. No  pneumothorax. IMPRESSION: 1. Left IJ line in stable position. 2. Cardiomegaly with persistent bilateral pulmonary infiltrates, partial clearing from prior exam. Persistent small pleural effusions. Electronically Signed   By: Maisie Fus  Register   On: 08/28/2015 07:18   Dg Chest Port 1 View  08/26/2015  CLINICAL DATA:  80 year old female central line placement. Initial encounter. EXAM: PORTABLE CHEST 1 VIEW COMPARISON:  1306 hours today. FINDINGS: Portable AP semi upright view at 2259 hours. Left IJ central line has been placed. Tip is at the lower SVC  level. No pneumothorax. Stable cardiac size and mediastinal contours. Interval mild regression of widespread patchy in perihilar opacity, improvement is greater in the upper lobes. Residual in the lower lobes. No large pleural effusion. IMPRESSION: 1. Left IJ central line placed, tip at the lower SVC level with no adverse features. 2. Some interval regression of pulmonary edema, residual in the lower lungs. Electronically Signed   By: Odessa Fleming M.D.   On: 08/26/2015 23:06   Dg Chest Portable 1 View  08/26/2015  CLINICAL DATA:  Shortness of breath. EXAM: PORTABLE CHEST 1 VIEW COMPARISON:  None. FINDINGS: Borderline cardiomegaly is noted. No pneumothorax or significant pleural effusion is noted. Diffuse patchy airspace opacities are noted throughout both lungs concerning for pneumonia or edema. Bony thorax is unremarkable. IMPRESSION: Diffuse patchy bilateral airspace opacities are noted concerning for pneumonia or edema. Electronically Signed   By: Lupita Raider, M.D.   On: 08/26/2015 13:24    PHYSICAL EXAM General: NAD. In Bed.  Neck: JVP 7-8 cm, no thyromegaly or thyroid nodule.  Lungs: Clear to auscultation bilaterally with normal respiratory effort. CV: Nondisplaced PMI.  Heart regular S1/S2 with paradoxical S2 split, no S3/S4, no murmur.  No peripheral edema.   Abdomen: Soft, nontender, no hepatosplenomegaly, no distention.  Neurologic: Alert and  oriented x 3.  Psych: Normal affect. Extremities: No clubbing or cyanosis.   TELEMETRY: NSR   ASSESSMENT AND PLAN: Ms Dias was readmitted 10/14 after syncopal episode requiring brief CPR.  1. Syncope: Occurred while on bedside commode. Required brief CPR. No meds required. ? Vagal episode associated with micturation versus arrhythmia.  Also with soft BP, we cut back her cardiac meds.  Concerned now regarding recurrent episodes of SVT with rate around 180 which may have been culprit => unfortunately, she was not on telemetry when event occurred.  - She has not been orthostatic when measured. - We cut back on her cardiac meds, BP has been stable with this change.  - Seen by Dr Elberta Fortis, plan on SVT ablation today or tomorrow.   - Will discuss Lifevest with EP, but with rapid SVT in setting of cardiomyopathy as possible culprit, think I would hold off.  2. Chronic Systolic HF: Primarily nonischemic cardiomyopathy. ECHO 10/9 EF 20-25% with LVH. Volume status ok. No diuretics.  - Continue Coreg 12.5 mg bid.  - Continue current doses of losartan and Bidil. - Can increase spironolactone to 25 mg daily.      3. CAD: LHC 10/10 with 20% proximal LAD, 40% mid LAD, 90% distal, large high OM1 with 80% ostial stenosis. Medically managed. Troponin normal this admission.  Some mild chest soreness from CPR.  - Contine ASA and statin.  4. CVA: MRI head last admission showed small infarcts in watershed region. She has left leg weakness that pre-dated her cardiac cath, ?related to uncontrolled HTN.  5. H/o SVT: Required amiodarone briefly last admission. 10/16 back in SVT and briefly on diltiazem gtt with conversion to NSR.  No further SVT.  ?rapid SVT in setting of low EF as contributor to syncopal event (along with iatrogenic low BP). EP has seen, possible AVNRT versus AT, ablation today or tomorrow. 6. HTN: Stable today. Renal artery dopplers did not show RAS.  7. Pulmonary: Persistent right-sided  infiltrate on CXR, decreased BS right base. Does not appear changed from prior. Normal WBCs, no fever. Had course of azithromycin. ?atelectasis with small effusion.   8. Needs ongoing PT/OT. Eventually back to CIR.  9. DVT prophylaxis:  Subcutaneous heparin.   Marca Ancona 09/04/2015 7:57 AM

## 2015-09-04 NOTE — Progress Notes (Signed)
[facility] PROGRESS NOTE  Julie Stein OMV:[id number] DOB: 1935/07/16 DOA: 08/31/2015 PCP: No PCP Per Patient  Brief narrative: Patient is a 80 year old who was an inpatient rehabilitation when she had syncopal episode. Happened while she was urinating. As such patient was observed and found to have SVT with heart rate as high as 180 reportedly. Cardiology consulted and assisting with case  Assessment/Plan: Active Problems:     Syncope - etiology uncertain. Vasovagal vs arrythmia. Telemetry has been negative for arrythmias.  May have been vasovagal as syncopal episode happened while she was going to the bathroom. Pt had SVT and required further evaluation and recommendations from cardiology. Discharge canceled and plans are for cardiology to further evaluate patient with possible ablation.  EP specialists on board  Hypertension - Pt on carvedilol, bidil, losartan and blood pressures relatively well controlled on this regimen.    Chronic systolic heart failure [facility]) - Cardiology on board. Continue medication above listed under htn    Microcytic hypochromic anemia - stable, no active bleeding    Chest pain - From chest compressions. Patient reports improvement in condition - continue supportive therapy  Code Status: full Family Communication: None at bedside Disposition Plan: Pending recommendations from specialist involved.   Consultants:  Cardiology  Procedures:  None  Antibiotics:  None  HPI/Subjective: Pt has no new complaints. No acute problems overnight reported to me.  Objective: Filed Vitals:   09/04/15 1718  BP: 161/90  Pulse: 92  Temp:   Resp:     Intake/Output Summary (Last 24 hours) at 09/04/15 1813 Last data filed at 09/04/15 1722  Gross per 24 hour  Intake   1080 ml  Output    750 ml  Net    330 ml   Filed Weights   09/02/15 0541 09/03/15 0448 09/04/15 0603  Weight: 49.9 kg (110 lb 0.2 oz) 48.399 kg (106 lb 11.2 oz) 48.172 kg (106 lb  3.2 oz)    Exam:   General:  Pt in nad,alert and awake  Cardiovascular: rrr, no mrg  Respiratory: cta bl, no wheezes  Abdomen: soft, nd, nt  Musculoskeletal: no cyanosis or clubbing   Data Reviewed: Basic Metabolic Panel:  Recent Labs Lab 08/31/15 0319 09/01/15 0313 09/02/15 0402 09/03/15 0320 09/04/15 0310  NA 140 140 137 136 142  K 3.6 3.8 3.8 3.7 3.4*  CL 105 108 102 102 105  CO2 26 24 26 27 29   GLUCOSE 154* 111* 111* 108* 89  BUN 15 16 15 12 11   CREATININE 1.19* 1.11* 1.05* 1.03* 1.14*  CALCIUM 9.4 9.0 8.9 8.9 9.3  MG 2.1  --   --   --   --    Liver Function Tests:  Recent Labs Lab 08/30/15 0551 08/31/15 0319  AST 27 33  ALT 19 24  ALKPHOS 63 65  BILITOT 0.6 0.7  PROT 6.0* 6.6  ALBUMIN 2.4* 3.0*   No results for input(s): LIPASE, AMYLASE in the last 168 hours. No results for input(s): AMMONIA in the last 168 hours. CBC:  Recent Labs Lab 08/29/15 0442 08/30/15 0551 08/31/15 0319 09/02/15 0402  WBC 12.2* 9.8 8.7 7.9  NEUTROABS  --  7.0 7.2  --   HGB 9.5* 10.2* 10.8* 9.6*  HCT 29.4* 31.3* 32.3* 30.0*  MCV 72.6* 72.5* 70.7* 71.8*  PLT 225 227 266 246   Cardiac Enzymes:  Recent Labs Lab 08/30/15 2215 08/31/15 0319 08/31/15 1015 08/31/15 1509  TROPONINI 0.05* 0.03 <0.03 0.03   BNP (last 3 results)  Recent Labs  08/26/15 1307  BNP 451.8*    ProBNP (last 3 results) No results for input(s): PROBNP in the last 8760 hours.  CBG:  Recent Labs Lab 08/28/15 2039 08/28/15 2342 08/29/15 0428 08/29/15 0737 08/29/15 1112  GLUCAP 130* 95 123* 75 112*    Recent Results (from the past 240 hour(s))  MRSA PCR Screening     Status: None   Collection Time: 08/27/15  6:08 AM  Result Value Ref Range Status   MRSA by PCR NEGATIVE NEGATIVE Final    Comment:        The GeneXpert MRSA Assay (FDA approved for NASAL specimens only), is one component of a comprehensive MRSA colonization surveillance program. It is not intended to  diagnose MRSA infection nor to guide or monitor treatment for MRSA infections.   Respiratory virus panel     Status: None   Collection Time: 08/27/15 12:38 PM  Result Value Ref Range Status   Source - RVPAN NASAL SWAB  Corrected   Respiratory Syncytial Virus A Negative Negative Final   Respiratory Syncytial Virus B Negative Negative Final   Influenza A Negative Negative Final   Influenza B Negative Negative Final   Parainfluenza 1 Negative Negative Final   Parainfluenza 2 Negative Negative Final   Parainfluenza 3 Negative Negative Final   Metapneumovirus Negative Negative Final   Rhinovirus Negative Negative Final   Adenovirus Negative Negative Final    Comment: (NOTE) Performed At: [facility] [address] [place], [place] [id number] Mila Homer MD WU:[id number]      Studies: No results found.  Scheduled Meds: . [START ON 09/05/2015] aspirin EC  81 mg Oral Daily  . atorvastatin  80 mg Oral q1800  . carvedilol  12.5 mg Oral BID WC  . heparin subcutaneous  5,000 Units Subcutaneous 3 times per day  . isosorbide-hydrALAZINE  0.5 tablet Oral TID  . losartan  25 mg Oral Daily  . sodium chloride  3 mL Intravenous Q12H  . spironolactone  25 mg Oral Daily   Continuous Infusions:    Time spent: > 35 minutes   Penny Pia  [facility] Pager [phone] If 7PM-7AM, please contact night-coverage at [email or web address], password [facility] 09/04/2015, 6:13 PM  LOS: 4 days

## 2015-09-05 ENCOUNTER — Encounter (HOSPITAL_COMMUNITY)
Admission: RE | Disposition: A | Discharge status: Rehabilitation Facility | Payer: Self-pay | Source: Ambulatory Visit | Attending: Family Medicine

## 2015-09-05 ENCOUNTER — Encounter (HOSPITAL_COMMUNITY): Payer: Self-pay | Admitting: Cardiology

## 2015-09-05 DIAGNOSIS — I471 Supraventricular tachycardia: Secondary | ICD-10-CM

## 2015-09-05 DIAGNOSIS — D509 Iron deficiency anemia, unspecified: Secondary | ICD-10-CM

## 2015-09-05 DIAGNOSIS — R079 Chest pain, unspecified: Secondary | ICD-10-CM | POA: Insufficient documentation

## 2015-09-05 DIAGNOSIS — Z8673 Personal history of transient ischemic attack (TIA), and cerebral infarction without residual deficits: Secondary | ICD-10-CM | POA: Insufficient documentation

## 2015-09-05 HISTORY — PX: ELECTROPHYSIOLOGIC STUDY: SHX172A

## 2015-09-05 LAB — BASIC METABOLIC PANEL
ANION GAP: 8 (ref 5–15)
BUN: 12 mg/dL (ref 6–20)
CALCIUM: 9.6 mg/dL (ref 8.9–10.3)
CO2: 27 mmol/L (ref 22–32)
Chloride: 105 mmol/L (ref 101–111)
Creatinine, Ser: 1.13 mg/dL — ABNORMAL HIGH (ref 0.44–1.00)
GFR, EST AFRICAN AMERICAN: 53 mL/min — AB (ref >60)
GFR, EST NON AFRICAN AMERICAN: 45 mL/min — AB (ref >60)
Glucose, Bld: 95 mg/dL (ref 65–99)
Potassium: 3.9 mmol/L (ref 3.5–5.1)
Sodium: 140 mmol/L (ref 135–145)

## 2015-09-05 LAB — CBC
HCT: 29.5 % — ABNORMAL LOW (ref 36.0–46.0)
HCT: 29.8 % — ABNORMAL LOW (ref 36.0–46.0)
HEMOGLOBIN: 9.2 g/dL — AB (ref 12.0–15.0)
Hemoglobin: 9.6 g/dL — ABNORMAL LOW (ref 12.0–15.0)
MCH: 22.7 pg — ABNORMAL LOW (ref 26.0–34.0)
MCH: 23.5 pg — ABNORMAL LOW (ref 26.0–34.0)
MCHC: 31.2 g/dL (ref 30.0–36.0)
MCHC: 32.2 g/dL (ref 30.0–36.0)
MCV: 72.8 fL — ABNORMAL LOW (ref 78.0–100.0)
MCV: 73 fL — ABNORMAL LOW (ref 78.0–100.0)
PLATELETS: 230 10*3/uL (ref 150–400)
Platelets: 310 10*3/uL (ref 150–400)
RBC: 4.05 MIL/uL (ref 3.87–5.11)
RBC: 4.08 MIL/uL (ref 3.87–5.11)
RDW: 15.8 % — ABNORMAL HIGH (ref 11.5–15.5)
RDW: 15.9 % — AB (ref 11.5–15.5)
WBC: 7.8 10*3/uL (ref 4.0–10.5)
WBC: 7.2 10*3/uL (ref 4.0–10.5)

## 2015-09-05 LAB — CREATININE, SERUM
CREATININE: 1.1 mg/dL — AB (ref 0.44–1.00)
GFR, EST AFRICAN AMERICAN: 54 mL/min — AB (ref >60)
GFR, EST NON AFRICAN AMERICAN: 47 mL/min — AB (ref >60)

## 2015-09-05 SURGERY — A-FLUTTER/A-TACH/SVT ABLATION

## 2015-09-05 MED ORDER — MIDAZOLAM HCL 5 MG/5ML IJ SOLN
INTRAMUSCULAR | Status: DC | PRN
  Administered 2015-09-05: 2 mg via INTRAVENOUS

## 2015-09-05 MED ORDER — BUPIVACAINE HCL (PF) 0.25 % IJ SOLN
INTRAMUSCULAR | Status: AC
  Filled 2015-09-05: qty 30

## 2015-09-05 MED ORDER — SODIUM CHLORIDE 0.9 % IV SOLN: 250.0000 mL | INTRAVENOUS | Status: DC | PRN

## 2015-09-05 MED ORDER — FENTANYL CITRATE (PF) 100 MCG/2ML IJ SOLN
INTRAMUSCULAR | Status: DC | PRN
  Administered 2015-09-05: 25 ug via INTRAVENOUS

## 2015-09-05 MED ORDER — FENTANYL CITRATE (PF) 100 MCG/2ML IJ SOLN
INTRAMUSCULAR | Status: AC
  Filled 2015-09-05: qty 4

## 2015-09-05 MED ORDER — BUPIVACAINE HCL (PF) 0.25 % IJ SOLN
INTRAMUSCULAR | Status: DC | PRN
  Administered 2015-09-05: 35 mL

## 2015-09-05 MED ORDER — SODIUM CHLORIDE 0.9 % IV SOLN
INTRAVENOUS | Status: DC
  Administered 2015-09-05: 08:00:00 via INTRAVENOUS

## 2015-09-05 MED ORDER — ISOSORB DINITRATE-HYDRALAZINE 20-37.5 MG PO TABS
1.0000 | ORAL_TABLET | Freq: Three times a day (TID) | ORAL | Status: DC
  Administered 2015-09-05: 1 via ORAL
  Filled 2015-09-05: qty 1

## 2015-09-05 MED ORDER — ONDANSETRON HCL 4 MG/2ML IJ SOLN: 4.0000 mg | Freq: Four times a day (QID) | INTRAMUSCULAR | Status: DC | PRN

## 2015-09-05 MED ORDER — MIDAZOLAM HCL 5 MG/5ML IJ SOLN
INTRAMUSCULAR | Status: AC
  Filled 2015-09-05: qty 25

## 2015-09-05 MED ORDER — SODIUM CHLORIDE 0.9 % IJ SOLN
3.0000 mL | Freq: Two times a day (BID) | INTRAMUSCULAR | Status: DC
  Administered 2015-09-05: 3 mL via INTRAVENOUS

## 2015-09-05 MED ORDER — ACETAMINOPHEN 325 MG PO TABS
650.0000 mg | ORAL_TABLET | ORAL | Status: DC | PRN
  Administered 2015-09-05: 650 mg via ORAL
  Filled 2015-09-05: qty 2

## 2015-09-05 MED ORDER — HEPARIN (PORCINE) IN NACL 2-0.9 UNIT/ML-% IJ SOLN
INTRAMUSCULAR | Status: DC | PRN
  Administered 2015-09-05: 09:00:00

## 2015-09-05 MED ORDER — ISOPROTERENOL HCL 0.2 MG/ML IJ SOLN
1.0000 mg | INTRAVENOUS | Status: DC | PRN
  Administered 2015-09-05: 2 ug/min via INTRAVENOUS

## 2015-09-05 MED ORDER — ISOSORB DINITRATE-HYDRALAZINE 20-37.5 MG PO TABS
0.5000 | ORAL_TABLET | Freq: Three times a day (TID) | ORAL | Status: DC
  Administered 2015-09-05 – 2015-09-06 (×2): 0.5 via ORAL
  Filled 2015-09-05 (×2): qty 1

## 2015-09-05 MED ORDER — ISOPROTERENOL HCL 0.2 MG/ML IJ SOLN
INTRAMUSCULAR | Status: AC
  Filled 2015-09-05: qty 5

## 2015-09-05 MED ORDER — HEPARIN SODIUM (PORCINE) 5000 UNIT/ML IJ SOLN
5000.0000 [IU] | Freq: Three times a day (TID) | INTRAMUSCULAR | Status: DC
  Administered 2015-09-05 – 2015-09-06 (×2): 5000 [IU] via SUBCUTANEOUS
  Filled 2015-09-05 (×3): qty 1

## 2015-09-05 MED ORDER — SODIUM CHLORIDE 0.9 % IJ SOLN: 3.0000 mL | INTRAMUSCULAR | Status: DC | PRN

## 2015-09-05 SURGICAL SUPPLY — 16 items
BAG SNAP BAND KOVER 36X36 (MISCELLANEOUS) ×3 IMPLANT
CATH EZ STEER NAV 4MM D-F CUR (ABLATOR) ×3 IMPLANT
CATH JOSEPHSON QUAD-ALLRED 6FR (CATHETERS) ×6 IMPLANT
CATH WEBSTER BI DIR CS D-F CRV (CATHETERS) ×3 IMPLANT
COVER SWIFTLINK CONNECTOR (BAG) ×3 IMPLANT
PACK EP LATEX FREE (CUSTOM PROCEDURE TRAY) ×2
PACK EP LF (CUSTOM PROCEDURE TRAY) ×1 IMPLANT
PAD DEFIB LIFELINK (PAD) ×3 IMPLANT
PATCH CARTO3 (PAD) ×3 IMPLANT
PINNACLE LONG 6F 25CM (SHEATH) ×6
SHEATH INTRO PINNACLE 6F 25CM (SHEATH) ×2 IMPLANT
SHEATH PINNACLE 6F 10CM (SHEATH) ×3 IMPLANT
SHEATH PINNACLE 7F 10CM (SHEATH) ×3 IMPLANT
SHEATH PINNACLE 8F 10CM (SHEATH) ×3 IMPLANT
SHIELD RADPAD SCOOP 12X17 (MISCELLANEOUS) ×3 IMPLANT
WIRE HI TORQ VERSACORE-J 145CM (WIRE) ×3 IMPLANT

## 2015-09-05 NOTE — Progress Notes (Signed)
PT Cancellation Note  Patient Details Name: Zipporah Odem MRN: [id number] DOB: August 16, 1935   Cancelled Treatment:    Reason Eval/Treat Not Completed: Patient at procedure or test/unavailable, at cath lab for ablation   Fabio Asa 09/05/2015, 11:13 AM Charlotte Crumb, PT DPT  [phone]

## 2015-09-05 NOTE — IPOC Note (Signed)
Overall Plan of Care [facility]) Patient Details Name: Julie Stein MRN: [id number] DOB: Sep 15, 1935  Admitting Diagnosis: Watershed infarcts   Hospital Problems: Principal Problem:   Syncope and collapse Active Problems:   Acute bilat watershed infarction [facility])   Hypertension   Acute on chronic systolic CHF (congestive heart failure) (HCC)   Syncope     Functional Problem List: Nursing Bowel, Endurance, Medication Management, Motor  PT Balance, Perception, Safety, Endurance, Motor  OT Balance, Endurance, Motor, Perception, Safety  SLP    TR         Basic ADL's: OT Grooming, Bathing, Dressing, Toileting     Advanced  ADL's: OT Simple Meal Preparation, Laundry     Transfers: PT Bed Mobility, Bed to Chair, Car, [employer], Civil Service fast streamer, Research scientist (life sciences): PT Stairs, Ambulation, Wheelchair Mobility     Additional Impairments: OT None  SLP        TR      Anticipated Outcomes Item Anticipated Outcome  Self Feeding No goal  Swallowing      Basic self-care  Marketing executive Transfers Supervision  Bowel/Bladder  Mod I  Transfers  mod I  Locomotion  supevision with LRAD  Communication     Cognition     Pain  n/a  Safety/Judgment  Mod I   Therapy Plan: PT Intensity: Minimum of 1-2 x/day ,45 to 90 minutes PT Frequency: 5 out of 7 days PT Duration Estimated Length of Stay: 10-14 days OT Intensity: Minimum of 1-2 x/day, 45 to 90 minutes OT Frequency: 5 out of 7 days OT Duration/Estimated Length of Stay: 10-12 days         Team Interventions: Nursing Interventions Patient/Family Education, Disease Management/Prevention, Bowel Management  PT interventions Ambulation/gait training, Warden/ranger, Community reintegration, Discharge planning, DME/adaptive equipment instruction, Functional mobility training, Neuromuscular re-education, Patient/family education, Psychosocial support, Stair training,  Therapeutic Exercise, UE/LE Coordination activities, Wheelchair propulsion/positioning, UE/LE Strength taining/ROM, Therapeutic Activities, Visual/perceptual remediation/compensation  OT Interventions Warden/ranger, Firefighter, Discharge planning, Disease mangement/prevention, Fish farm manager, Functional mobility training, Neuromuscular re-education, Pain management, Patient/family education, Psychosocial support, Self Care/advanced ADL retraining, Therapeutic Activities, Therapeutic Exercise, UE/LE Strength taining/ROM, UE/LE Coordination activities, Visual/perceptual remediation/compensation  SLP Interventions    TR Interventions    SW/CM Interventions Discharge Planning, Psychosocial Support, Patient/Family Education    Team Discharge Planning: Destination: PT-Home ,OT- Home , SLP-  Projected Follow-up: PT-Outpatient PT, OT-  Home health OT, SLP-  Projected Equipment Needs: PT-To be determined, OT- Tub/shower seat, SLP-  Equipment Details: PT- , OT-  Patient/family involved in discharge planning: PT- Family member/caregiver, Patient,  OT-Patient, Family member/caregiver, SLP-   MD ELOS: Interrupted by acute care transfer Medical Rehab Prognosis:  Guarded Assessment: 80 yo female admitted to rehab but developed acute change in medical status requiring transfer to acute care within 72 hrs of admit to CIR.    See Team Conference Notes for weekly updates to the plan of care

## 2015-09-05 NOTE — Progress Notes (Addendum)
Paged about pt becoming hypotensive with SBP in 70s after dose of Bidil (increase to 1 tab from 1/2 tab yesterday) and EP procedure this morning.     That was several hours ago, Pt is on 50 mL/hr of NS and has now improved to SBP in 120s.   Likely 2/2 to sedation from the procedure this morning.  Will hold afternoon dose of Bidil and resume this pm at decreased dose of 1/2 tab TID for now.  Casimiro Needle [address]" [place], PA-C 09/05/2015 3:02 PM

## 2015-09-05 NOTE — Progress Notes (Signed)
Site area: rt groin fv sheaths-2 Site Prior to Removal:  Level  0 Pressure Applied For:  15 minutes Manual:   yes Patient Status During Pull:  stable Post Pull Site:  Level  0 Post Pull Instructions Given:  yes Post Pull Pulses Present: yes Dressing Applied:  Small tegaderm Bedrest begins @  Comments:

## 2015-09-05 NOTE — Progress Notes (Signed)
SUBJECTIVE: The patient is doing well this morning, another daughter is at bedside this morning, Dr. Elberta Fortis answered all of the patient and daughters questions regarding the EPS/ablation procedure.  At this time, she again denies chest pain, shortness of breath, or any new concerns.  Marland Kitchen aspirin EC  81 mg Oral Daily  . atorvastatin  80 mg Oral q1800  . carvedilol  12.5 mg Oral BID WC  . heparin subcutaneous  5,000 Units Subcutaneous 3 times per day  . isosorbide-hydrALAZINE  0.5 tablet Oral TID  . losartan  25 mg Oral Daily  . sodium chloride  3 mL Intravenous Q12H  . spironolactone  25 mg Oral Daily   . sodium chloride 50 mL/hr at 09/05/15 0741    OBJECTIVE: Physical Exam: Filed Vitals:   09/04/15 0603 09/04/15 1718 09/04/15 2116 09/05/15 0533  BP: 109/71 161/90 151/90 141/86  Pulse: 85 92 88 85  Temp: 98.5 F (36.9 C)  98.2 F (36.8 C) 97.9 F (36.6 C)  TempSrc: Oral  Oral Oral  Resp: Height:      Weight: 106 lb 3.2 oz (48.172 kg)   105 lb 14.4 oz (48.036 kg)  SpO2: 100%  97% 98%    Intake/Output Summary (Last 24 hours) at 09/05/15 0829 Last data filed at 09/05/15 0600  Gross per 24 hour  Intake    840 ml  Output      0 ml  Net    840 ml    Telemetry reveals sinus rhythm, no SVT noted  GEN- The patient is thin, frail appearing, alert and oriented x 3 today.   Head- normocephalic, atraumatic Eyes-  Sclera clear, conjunctiva pink Ears- hearing intact Neck- supple, no JVP Lungs- Clear to ausculation bilaterally, normal work of breathing Heart- Regular rate and rhythm, no significant murmurs, rubs or gallops, PMI not laterally displaced GI- soft, NT, ND, + BS Extremities- no clubbing, cyanosis, or edema Skin- no rash or lesion Psych- euthymic mood, full affect   LABS: Basic Metabolic Panel:  Recent Labs  16/10/96 0310 09/05/15 0312  NA 142 140  K 3.4* 3.9  CL 105 105  CO2 29 27  GLUCOSE 89 95  BUN 11 12  CREATININE 1.14* 1.13*  CALCIUM  9.3 9.6   CBC:  Recent Labs  09/05/15 0312  WBC 7.8  HGB 9.2*  HCT 29.5*  MCV 72.8*  PLT 310    RADIOLOGY:  Dg Chest 2 View 08/31/2015  CLINICAL DATA:  Dyspnea. EXAM: CHEST  2 VIEW COMPARISON:  August 31, 2015. FINDINGS: Stable cardiomediastinal silhouette. No pneumothorax is noted. Stable right basilar opacity is noted concerning for pneumonia or atelectasis with associated pleural effusion. Mild left basilar subsegmental atelectasis is noted. Bony thorax is unremarkable. IMPRESSION: Stable right basilar opacity is noted concerning for pneumonia or atelectasis with associated pleural effusion. Mild left basilar subsegmental atelectasis is noted as well. Electronically Signed   By: Lupita Raider, M.D.   On: 08/31/2015 11:00   08/27/15: [facility] 1. Normal to low filling pressures and normal cardiac output.  2. Moderate to severe ostial OM1 and severe distal LAD stenoses.  I suspect that the etiology of her flash pulmonary edema was a hypertensive crisis leading to demand ischemia mediated by fixed stenoses in the ostial OM1 and distal LAD. Neither lesions appeared acute. She is doing much better. I would aim for medical management for now, can intervene on the OM1 if she has chest pain or flash  pulmonary edema. I think that the etiology of her cardiomyopathy is likely poorly controlled HTN rather than the CAD  08/26/15: Echocardiogram Impressions: - Left ventricle has normal size with mild concentric LVH and severely decreased systolic function, LVEF 20-25%. There is diffuse hypokinesis.  The fact that there is only grade I diastolic dysfunction with normal left atrial size and LV has normal size is suggestive of an acute process rather than a chronic condition.   ASSESSMENT AND PLAN:  1. Syncope: Occurred while on bedside commode. Required brief CPR. No meds required. Possible vagal episode associated with micturation versus arrhythmia, unfortunately, she was not on  telemetry when event occurred.  - She has h/o SVT: Required amiodarone briefly on a prior admission, this admit back in SVT and briefly on diltiazem gtt with conversion to NSR. No further SVT has been observed.  There is question if rapid SVT in setting of low EF contributed to her syncopal event +/- hypotension. The patient is seen by Dr. Elberta Fortis,  Genene Kilman plan for SVT ablation today.  Continue with CHF and primary care team:  2. Chronic Systolic HF: Primarily nonischemic cardiomyopathy.      She is on BB/ARB, and aldactone      Appears euvolemic 3. CAD 4. CVA 5. H/o SVT 6. HTN   Francis Dowse, PA-C 09/05/2015 8:29 AM   I have seen and examined this patient with Francis Dowse.  Agree with above, note added to reflect my findings.  On exam, regular rhythm, no murmurs, lungs clear.  Plan for EP study and possible ablation of SVT this morning.  Explained benefits and risks to the patient and daughter including bleeding, tamponade and heart block.  All in agreement.    Aerielle Stoklosa M. Nashia Remus MD 09/05/2015 8:30 AM

## 2015-09-05 NOTE — Progress Notes (Signed)
Site area: left groin fv sheaths 2 Site Prior to Removal:  Level 0 Pressure Applied For:  15 minutes Manual:   yes Patient Status During Pull:  stable Post Pull Site:  Level  0 Post Pull Instructions Given:  yes Post Pull Pulses Present: yes Dressing Applied:  Small tegaderm Bedrest begins @  1140 Comments:  0

## 2015-09-05 NOTE — Progress Notes (Signed)
SUBJECTIVE: The patient is doing well this morning, another daughter is at bedside this morning, Dr. Elberta Fortis answered all of the patient and daughters questions regarding the EPS/ablation procedure.  At this time, she again denies chest pain, shortness of breath, or any new concerns.  Marland Kitchen aspirin EC  81 mg Oral Daily  . atorvastatin  80 mg Oral q1800  . carvedilol  12.5 mg Oral BID WC  . heparin subcutaneous  5,000 Units Subcutaneous 3 times per day  . isosorbide-hydrALAZINE  0.5 tablet Oral TID  . losartan  25 mg Oral Daily  . sodium chloride  3 mL Intravenous Q12H  . spironolactone  25 mg Oral Daily   . sodium chloride 50 mL/hr at 09/05/15 0741    OBJECTIVE: Physical Exam: Filed Vitals:   09/04/15 0603 09/04/15 1718 09/04/15 2116 09/05/15 0533  BP: 109/71 161/90 151/90 141/86  Pulse: 85 92 88 85  Temp: 98.5 F (36.9 C)  98.2 F (36.8 C) 97.9 F (36.6 C)  TempSrc: Oral  Oral Oral  Resp: 19  18 18   Height:      Weight: 106 lb 3.2 oz (48.172 kg)   105 lb 14.4 oz (48.036 kg)  SpO2: 100%  97% 98%    Intake/Output Summary (Last 24 hours) at 09/05/15 0802 Last data filed at 09/05/15 0600  Gross per 24 hour  Intake    840 ml  Output      0 ml  Net    840 ml    Telemetry reveals sinus rhythm, no SVT noted  GEN- The patient is thin, frail appearing, alert and oriented x 3 today.   Head- normocephalic, atraumatic Eyes-  Sclera clear, conjunctiva pink Ears- hearing intact Neck- supple, no JVP Lungs- Clear to ausculation bilaterally, normal work of breathing Heart- Regular rate and rhythm, no significant murmurs, rubs or gallops, PMI not laterally displaced GI- soft, NT, ND, + BS Extremities- no clubbing, cyanosis, or edema Skin- no rash or lesion Psych- euthymic mood, full affect   LABS: Basic Metabolic Panel:  Recent Labs  70/76/15 0310 09/05/15 0312  NA 142 140  K 3.4* 3.9  CL 105 105  CO2 29 27  GLUCOSE 89 95  BUN 11 12  CREATININE 1.14* 1.13*  CALCIUM  9.3 9.6   CBC:  Recent Labs  09/05/15 0312  WBC 7.8  HGB 9.2*  HCT 29.5*  MCV 72.8*  PLT 310    RADIOLOGY:  Dg Chest 2 View 08/31/2015  CLINICAL DATA:  Dyspnea. EXAM: CHEST  2 VIEW COMPARISON:  August 31, 2015. FINDINGS: Stable cardiomediastinal silhouette. No pneumothorax is noted. Stable right basilar opacity is noted concerning for pneumonia or atelectasis with associated pleural effusion. Mild left basilar subsegmental atelectasis is noted. Bony thorax is unremarkable. IMPRESSION: Stable right basilar opacity is noted concerning for pneumonia or atelectasis with associated pleural effusion. Mild left basilar subsegmental atelectasis is noted as well. Electronically Signed   By: Lupita Raider, M.D.   On: 08/31/2015 11:00   08/27/15: [facility] 1. Normal to low filling pressures and normal cardiac output.  2. Moderate to severe ostial OM1 and severe distal LAD stenoses.  I suspect that the etiology of her flash pulmonary edema was a hypertensive crisis leading to demand ischemia mediated by fixed stenoses in the ostial OM1 and distal LAD. Neither lesions appeared acute. She is doing much better. I would aim for medical management for now, can intervene on the OM1 if she has chest pain or flash  pulmonary edema. I think that the etiology of her cardiomyopathy is likely poorly controlled HTN rather than the CAD  08/26/15: Echocardiogram Impressions: - Left ventricle has normal size with mild concentric LVH and severely decreased systolic function, LVEF 20-25%. There is diffuse hypokinesis.  The fact that there is only grade I diastolic dysfunction with normal left atrial size and LV has normal size is suggestive of an acute process rather than a chronic condition.   ASSESSMENT AND PLAN:  1. Syncope: Occurred while on bedside commode. Required brief CPR. No meds required. Possible vagal episode associated with micturation versus arrhythmia, unfortunately, she was not on  telemetry when event occurred.  - She has h/o SVT: Required amiodarone briefly on a prior admission, this admit back in SVT and briefly on diltiazem gtt with conversion to NSR. No further SVT has been observed.  There is question if rapid SVT in setting of low EF contributed to her syncopal event +/- hypotension. The patient is seen by Dr. Elberta Fortis,  will plan for SVT ablation today.  Continue with CHF and primary care team:  2. Chronic Systolic HF: Primarily nonischemic cardiomyopathy.      She is on BB/ARB, and aldactone      Appears euvolemic 3. CAD 4. CVA 5. H/o SVT 6. HTN   Julie Stein, [place] 09/05/2015 8:02 AM

## 2015-09-05 NOTE — Progress Notes (Signed)
[facility]                                                                              Patient Demographics  Julie Stein, is a 80 y.o. female, DOB - 12/06/1934, RUE:[id number]  Admit date - 08/31/2015   Admitting Physician Eduard Clos, MD  Outpatient Primary MD for the patient is No PCP Per Patient  LOS - 5   No chief complaint on file.     HPI on 08/31/2015 by Dr. Delrae Sawyers Julie Stein is a 80 y.o. female who was recently admitted for acute pulmonary edema with 2-D echo showing EF of 20-25% and cardiac cath was done and was planned to be managed medically and during hospitalization patient also had left-sided weakness MRI showed stroke and patient was eventually discharged to a rehabilitation 2 days ago. In the rehabilitation yesterday patient started developing some chest pressure which lasted for 1 hour and resolved spontaneously initially but later the chest pressure again developed late in the evening which also resolved. Later when patient was going to the bathroom and urinating patient had a syncopal episode which lasted for less than a minute. CODE BLUE was called and patient was given at least 2-3 at bedtime compression following which patient regained pulse and became more alert and awake. Patient does not recall the incident. Patient is currently chest pain-free and on exam nonfocal. Patient is admitted for syncopal episode. Reviewand is found to be mildly hypotensive early in the day. Presently is normotensive. Patient denies any palpitations or shortness of breath. Patient's repeat chest x-ray and labs are pending. EKG shows normal sinus rhythm with PVCs. Patient during last admission was briefly in SVT and required amiodarone infusion.  Assessment & Plan   Syncope -Possibly secondary to SVT versus vasovagal as episode did occur on bedside commode -Cardiology consultant appreciated, plan for ablation today  Chronic systolic heart failure -Currently  euvolemic -Echocardiogram 08/26/2015 showed EF of 20-25% with LVH -Continue Coreg, losartan, spironolactone -Continue monitor intake and output, daily weights  Microcytic anemia -Hemoglobin appears to be at baseline, continue to monitor CBC  Chest pain  -Likely musculoskeletal, patient did have chest compressions -No further chest pain today -Continue aspirin, statin, Coreg, Cozaar  History of CVA -Continue aspirin and statin, blood pressure control -Patient will likely need to be discharged back to inpatient rehabilitation once stable  Essential hypertension -Continue Coreg, Cozaar, bidil, spironolactone  Chronic kidney disease, stage III -Creatinine currently stable, continue to monitor BMP  Code Status: Full  Family Communication: Daughter at bedside  Disposition Plan: Admitted, pending ablation today  Time Spent in minutes   30 minutes  Procedures  None  Consults   Cardiology/EP  DVT Prophylaxis  Heparin  Lab Results  Component Value Date   PLT 310 09/05/2015    Medications  Scheduled Meds: . aspirin EC  81 mg Oral Daily  . atorvastatin  80 mg Oral q1800  . carvedilol  12.5 mg Oral BID WC  . heparin subcutaneous  5,000 Units Subcutaneous 3 times per day  . isosorbide-hydrALAZINE  1 tablet Oral TID  . losartan  25 mg Oral Daily  . sodium chloride  3 mL Intravenous  Q12H  . spironolactone  25 mg Oral Daily   Continuous Infusions: . sodium chloride 50 mL/hr at 09/05/15 0741  . isoproterenol (ISUPREL) infusion Stopped (09/05/15 1033)   PRN Meds:.acetaminophen **OR** acetaminophen, bupivacaine (PF), fentaNYL, isoproterenol (ISUPREL) infusion, midazolam, nitroGLYCERIN, ondansetron **OR** ondansetron (ZOFRAN) IV  Antibiotics    Anti-infectives    None      Subjective:   Julie Stein seen and examined today.  Patient has no complaints today. Denies any chest pain, shortness of breath, abdominal pain, nausea or vomiting.  Objective:   Filed Vitals:     09/04/15 2116 09/05/15 0533 09/05/15 0914 09/05/15 1046  BP: 151/90 141/86    Pulse: 88 85    Temp: 98.2 F (36.8 C) 97.9 F (36.6 C)    TempSrc: Oral Oral    Resp: 18 18    Height:      Weight:  48.036 kg (105 lb 14.4 oz)    SpO2: 97% 98% 97% 96%    Wt Readings from Last 3 Encounters:  09/05/15 48.036 kg (105 lb 14.4 oz)  08/26/15 49.6 kg (109 lb 5.6 oz)     Intake/Output Summary (Last 24 hours) at 09/05/15 1047 Last data filed at 09/05/15 8119  Gross per 24 hour  Intake    720 ml  Output      0 ml  Net    720 ml    Exam  General: Well developed, well nourished, NAD, appears stated age  HEENT: NCAT, mucous membranes moist.   Cardiovascular: S1 S2 auscultated, no rubs, murmurs or gallops. Regular rate and rhythm.  Respiratory: Clear to auscultation bilaterally  Abdomen: Soft, nontender, nondistended, + bowel sounds  Extremities: warm dry without cyanosis clubbing or edema  Neuro: AAOx3, cranial nerves grossly intact. Right sided weakness.   Psych: Normal affect and demeanor   Data Review   Micro Results Recent Results (from the past 240 hour(s))  MRSA PCR Screening     Status: None   Collection Time: 08/27/15  6:08 AM  Result Value Ref Range Status   MRSA by PCR NEGATIVE NEGATIVE Final    Comment:        The GeneXpert MRSA Assay (FDA approved for NASAL specimens only), is one component of a comprehensive MRSA colonization surveillance program. It is not intended to diagnose MRSA infection nor to guide or monitor treatment for MRSA infections.   Respiratory virus panel     Status: None   Collection Time: 08/27/15 12:38 PM  Result Value Ref Range Status   Source - RVPAN NASAL SWAB  Corrected   Respiratory Syncytial Virus A Negative Negative Final   Respiratory Syncytial Virus B Negative Negative Final   Influenza A Negative Negative Final   Influenza B Negative Negative Final   Parainfluenza 1 Negative Negative Final   Parainfluenza 2 Negative  Negative Final   Parainfluenza 3 Negative Negative Final   Metapneumovirus Negative Negative Final   Rhinovirus Negative Negative Final   Adenovirus Negative Negative Final    Comment: (NOTE) Performed At: [facility] [address] [place], [place] [id number] Mila Homer MD ZH:[id number]     Radiology Reports Dg Chest 2 View  08/31/2015  CLINICAL DATA:  Dyspnea. EXAM: CHEST  2 VIEW COMPARISON:  August 31, 2015. FINDINGS: Stable cardiomediastinal silhouette. No pneumothorax is noted. Stable right basilar opacity is noted concerning for pneumonia or atelectasis with associated pleural effusion. Mild left basilar subsegmental atelectasis is noted. Bony thorax is unremarkable. IMPRESSION: Stable right basilar opacity  is noted concerning for pneumonia or atelectasis with associated pleural effusion. Mild left basilar subsegmental atelectasis is noted as well. Electronically Signed   By: Lupita Raider, M.D.   On: 08/31/2015 11:00   Ct Head Wo Contrast  08/27/2015  CLINICAL DATA:  Initial evaluation for acute unresponsiveness. , left-sided weakness, now resolved. EXAM: CT HEAD WITHOUT CONTRAST TECHNIQUE: Contiguous axial images were obtained from the base of the skull through the vertex without intravenous contrast. COMPARISON:  None. FINDINGS: Age-related cerebral volume loss present. Patchy and confluent hypodensity within the periventricular and deep white matter both cerebral hemispheres most consistent with chronic small vessel ischemic disease. Scattered vascular calcifications within the carotid siphons and distal right vertebral artery. No acute large vessel territory infarct. Gray-white matter differentiation grossly maintained. Deep gray nuclei are symmetric. No acute intracranial hemorrhage. No mass lesion, midline shift, or mass effect. No hydrocephalus. No extra-axial fluid collection. Scalp soft tissues within normal limits. No acute abnormality about the orbits. Paranasal  sinuses mastoid air cells are clear. Calvarium intact. IMPRESSION: 1. No acute intracranial process. 2. Age-related cerebral atrophy with chronic small vessel ischemic disease. Electronically Signed   By: Rise Mu M.D.   On: 08/27/2015 02:31   [name] Wo Contrast  08/28/2015  CLINICAL DATA:  Initial evaluation for transient left-sided weakness following an episode of unresponsiveness and hypotension. EXAM: MRI HEAD WITHOUT CONTRAST MRA HEAD WITHOUT CONTRAST TECHNIQUE: Multiplanar, multiecho pulse sequences of the brain and surrounding structures were obtained without intravenous contrast. Angiographic images of the head were obtained using MRA technique without contrast. COMPARISON:  Prior CT from 08/27/2015. FINDINGS: MRI HEAD FINDINGS Diffuse prominence of the CSF containing spaces is compatible with generalized age-related cerebral atrophy. Patchy and confluent T2/FLAIR hyperintensity within the periventricular and deep white matter both cerebral hemispheres most consistent with chronic small vessel ischemic disease. Small remote lacunar infarct within the periventricular white matter of the left corona radiata. There is a few small foci of patchy restricted diffusion within the high left frontal lobe and right frontoparietal regions bilaterally (series 4, image 33, 35). Additional tiny punctate cortical infarct more inferiorly within the right occipital lobe (series 4, image 26). Associated signal loss seen on corresponding ADC map. No associated hemorrhage or mass effect. Bodies consistent with small acute ischemic infarcts. Given the distribution of these findings, underlying watershed etiology is favored. Normal intravascular flow voids are maintained. Additional scattered subcentimeter foci of susceptibility artifact seen in within the peripheral cortices of the bilateral cerebral hemispheres on gradient echo sequence, consistent with small chronic micro hemorrhages. While these could  potentially be related underlying hypertension, possible amyloid angiopathy could be considered given their somewhat peripheral distribution. No mass lesion, midline shift, or mass effect. No hydrocephalus. No extra-axial fluid collection. Craniocervical junction within normal limits. Mild degenerative spondylolysis noted within the visualized upper cervical spine. Pituitary gland normal. Thinning of the anterior body of the corpus callosum noted, which may related to remote ischemia. No acute abnormality about the orbits. Mild right-sided exophthalmos. Paranasal sinuses are clear. No mastoid effusion. Inner ear structures within normal limits. Bone marrow signal intensity within normal limits. No scalp soft tissue abnormality. MRA HEAD FINDINGS ANTERIOR CIRCULATION: Visualized distal cervical segments of the internal carotid arteries are patent with antegrade flow. Petrous segments widely patent. Scattered multi focal atheromatous irregularity within the cavernous and supraclinoid left ICA. There is a short-segment moderate stenosis at the proximal supraclinoid left ICA (series 5, image 72). Cavernous and supraclinoid right ICA widely  patent. Right A1 segment widely patent. Left A1 segment slightly hypoplastic. Anterior communicating artery patent. Atheromatous irregularity within the anterior cerebral arteries bilaterally. M1 segments widely patent without stenosis or occlusion. Atheromatous irregularity within the left M1 segment. MCA bifurcations within normal limits. MCA branches symmetric bilaterally. Distal small vessel disease present within the MCA branches bilaterally. POSTERIOR CIRCULATION: Vertebral arteries patent to the vertebrobasilar junction. Right vertebral artery slightly diminutive. Posterior inferior cerebral arteries not well evaluated on this exam. Basilar artery widely patent. Small left anterior inferior cerebral artery noted. Superior cerebellar arteries patent. Both sub posterior cerebral  arteries arise from the basilar artery and are well opacified to their distal aspects. Distal small vessel disease within the PCA branches bilaterally. There is a more focal short-segment moderate stenosis within the proximal right P2 segment (series 505, image 15). No aneurysm or vascular malformation. IMPRESSION: MRI HEAD IMPRESSION: 1. Small volume patchy cortical and subcortical ischemic infarcts within the left frontal and right frontoparietal region, with additional tiny cortical infarct within the right occipital lobe. Given the distribution of these infarcts, an underlying watershed etiology is favored. No associated hemorrhage or mass effect. 2. Scattered small chronic micro hemorrhages as above. While these might be related to chronic underlying hypertension, possible amyloid angiopathy could also be considered given their somewhat peripheral distribution. 3. Atrophy with chronic microvascular ischemic disease. MRA HEAD IMPRESSION: 1. No large vessel or proximal arterial branch occlusion identified. 2. Short-segment moderate stenosis within the supraclinoid left ICA. 3. Short-segment moderate stenosis within the proximal right P2 segment. 4. Distal small vessel atheromatous disease within the MCA, ACA, and PCA branches bilaterally Electronically Signed   By: Rise Mu M.D.   On: 08/28/2015 02:26   Mr Brain Wo Contrast  08/28/2015  CLINICAL DATA:  Initial evaluation for transient left-sided weakness following an episode of unresponsiveness and hypotension. EXAM: MRI HEAD WITHOUT CONTRAST MRA HEAD WITHOUT CONTRAST TECHNIQUE: Multiplanar, multiecho pulse sequences of the brain and surrounding structures were obtained without intravenous contrast. Angiographic images of the head were obtained using MRA technique without contrast. COMPARISON:  Prior CT from 08/27/2015. FINDINGS: MRI HEAD FINDINGS Diffuse prominence of the CSF containing spaces is compatible with generalized age-related cerebral  atrophy. Patchy and confluent T2/FLAIR hyperintensity within the periventricular and deep white matter both cerebral hemispheres most consistent with chronic small vessel ischemic disease. Small remote lacunar infarct within the periventricular white matter of the left corona radiata. There is a few small foci of patchy restricted diffusion within the high left frontal lobe and right frontoparietal regions bilaterally (series 4, image 33, 35). Additional tiny punctate cortical infarct more inferiorly within the right occipital lobe (series 4, image 26). Associated signal loss seen on corresponding ADC map. No associated hemorrhage or mass effect. Bodies consistent with small acute ischemic infarcts. Given the distribution of these findings, underlying watershed etiology is favored. Normal intravascular flow voids are maintained. Additional scattered subcentimeter foci of susceptibility artifact seen in within the peripheral cortices of the bilateral cerebral hemispheres on gradient echo sequence, consistent with small chronic micro hemorrhages. While these could potentially be related underlying hypertension, possible amyloid angiopathy could be considered given their somewhat peripheral distribution. No mass lesion, midline shift, or mass effect. No hydrocephalus. No extra-axial fluid collection. Craniocervical junction within normal limits. Mild degenerative spondylolysis noted within the visualized upper cervical spine. Pituitary gland normal. Thinning of the anterior body of the corpus callosum noted, which may related to remote ischemia. No acute abnormality about the orbits. Mild right-sided exophthalmos.  Paranasal sinuses are clear. No mastoid effusion. Inner ear structures within normal limits. Bone marrow signal intensity within normal limits. No scalp soft tissue abnormality. MRA HEAD FINDINGS ANTERIOR CIRCULATION: Visualized distal cervical segments of the internal carotid arteries are patent with  antegrade flow. Petrous segments widely patent. Scattered multi focal atheromatous irregularity within the cavernous and supraclinoid left ICA. There is a short-segment moderate stenosis at the proximal supraclinoid left ICA (series 5, image 72). Cavernous and supraclinoid right ICA widely patent. Right A1 segment widely patent. Left A1 segment slightly hypoplastic. Anterior communicating artery patent. Atheromatous irregularity within the anterior cerebral arteries bilaterally. M1 segments widely patent without stenosis or occlusion. Atheromatous irregularity within the left M1 segment. MCA bifurcations within normal limits. MCA branches symmetric bilaterally. Distal small vessel disease present within the MCA branches bilaterally. POSTERIOR CIRCULATION: Vertebral arteries patent to the vertebrobasilar junction. Right vertebral artery slightly diminutive. Posterior inferior cerebral arteries not well evaluated on this exam. Basilar artery widely patent. Small left anterior inferior cerebral artery noted. Superior cerebellar arteries patent. Both sub posterior cerebral arteries arise from the basilar artery and are well opacified to their distal aspects. Distal small vessel disease within the PCA branches bilaterally. There is a more focal short-segment moderate stenosis within the proximal right P2 segment (series 505, image 15). No aneurysm or vascular malformation. IMPRESSION: MRI HEAD IMPRESSION: 1. Small volume patchy cortical and subcortical ischemic infarcts within the left frontal and right frontoparietal region, with additional tiny cortical infarct within the right occipital lobe. Given the distribution of these infarcts, an underlying watershed etiology is favored. No associated hemorrhage or mass effect. 2. Scattered small chronic micro hemorrhages as above. While these might be related to chronic underlying hypertension, possible amyloid angiopathy could also be considered given their somewhat  peripheral distribution. 3. Atrophy with chronic microvascular ischemic disease. MRA HEAD IMPRESSION: 1. No large vessel or proximal arterial branch occlusion identified. 2. Short-segment moderate stenosis within the supraclinoid left ICA. 3. Short-segment moderate stenosis within the proximal right P2 segment. 4. Distal small vessel atheromatous disease within the MCA, ACA, and PCA branches bilaterally Electronically Signed   By: Rise Mu M.D.   On: 08/28/2015 02:26   Dg Chest Right Decubitus  08/31/2015  CLINICAL DATA:  Dyspnea. EXAM: CHEST - RIGHT DECUBITUS COMPARISON:  Same day. FINDINGS: Right lateral decubitus view of the chest demonstrates mild free flowing right pleural effusion. IMPRESSION: Mild free flowing right pleural effusion. Electronically Signed   By: Lupita Raider, M.D.   On: 08/31/2015 11:27   Dg Chest Port 1 View  08/31/2015  CLINICAL DATA:  Chest pain beginning after physical therapy yesterday. EXAM: PORTABLE CHEST 1 VIEW COMPARISON:  08/28/2015 FINDINGS: Borderline heart size without vascular congestion. Infiltration in the right lung base obscuring the right hemidiaphragm. Probable small right pleural effusion. Appearance suggest pneumonia. Atelectasis in the left lung base is improved since previous study. No pneumothorax. Calcified and tortuous aorta. Mild thoracic scoliosis convex towards the right. IMPRESSION: Infiltration in the right lung base with probable small right pleural effusion. Findings suggest pneumonia. Electronically Signed   By: Burman Nieves M.D.   On: 08/31/2015 03:21   Dg Chest Port 1 View  08/28/2015  CLINICAL DATA:  Shortness of breath.  Pulmonary edema. EXAM: PORTABLE CHEST 1 VIEW COMPARISON:  08/26/2015. FINDINGS: Left IJ line in stable position. Cardiomegaly with bilateral pulmonary alveolar infiltrates. Slight improvement. Persistent small pleural effusions. No pneumothorax. IMPRESSION: 1. Left IJ line in stable position. 2. Cardiomegaly  with persistent bilateral pulmonary infiltrates, partial clearing from prior exam. Persistent small pleural effusions. Electronically Signed   By: Maisie Fus  Register   On: 08/28/2015 07:18   Dg Chest Port 1 View  08/26/2015  CLINICAL DATA:  80 year old female central line placement. Initial encounter. EXAM: PORTABLE CHEST 1 VIEW COMPARISON:  1306 hours today. FINDINGS: Portable AP semi upright view at 2259 hours. Left IJ central line has been placed. Tip is at the lower SVC level. No pneumothorax. Stable cardiac size and mediastinal contours. Interval mild regression of widespread patchy in perihilar opacity, improvement is greater in the upper lobes. Residual in the lower lobes. No large pleural effusion. IMPRESSION: 1. Left IJ central line placed, tip at the lower SVC level with no adverse features. 2. Some interval regression of pulmonary edema, residual in the lower lungs. Electronically Signed   By: Odessa Fleming M.D.   On: 08/26/2015 23:06   Dg Chest Portable 1 View  08/26/2015  CLINICAL DATA:  Shortness of breath. EXAM: PORTABLE CHEST 1 VIEW COMPARISON:  None. FINDINGS: Borderline cardiomegaly is noted. No pneumothorax or significant pleural effusion is noted. Diffuse patchy airspace opacities are noted throughout both lungs concerning for pneumonia or edema. Bony thorax is unremarkable. IMPRESSION: Diffuse patchy bilateral airspace opacities are noted concerning for pneumonia or edema. Electronically Signed   By: Lupita Raider, M.D.   On: 08/26/2015 13:24    CBC  Recent Labs Lab 08/30/15 0551 08/31/15 0319 09/02/15 0402 09/05/15 0312  WBC 9.8 8.7 7.9 7.8  HGB 10.2* 10.8* 9.6* 9.2*  HCT 31.3* 32.3* 30.0* 29.5*  PLT 227 266 246 310  MCV 72.5* 70.7* 71.8* 72.8*  MCH 23.6* 23.6* 23.0* 22.7*  MCHC 32.6 33.4 32.0 31.2  RDW 16.1* 15.7* 15.8* 15.8*  LYMPHSABS 1.8 0.9  --   --   MONOABS 0.6 0.4  --   --   EOSABS 0.3 0.2  --   --   BASOSABS 0.0 0.0  --   --     Chemistries   Recent Labs Lab  08/30/15 0551 08/31/15 0319 09/01/15 0313 09/02/15 0402 09/03/15 0320 09/04/15 0310 09/05/15 0312  NA 139 140 140 137 136 142 140  K 3.8 3.6 3.8 3.8 3.7 3.4* 3.9  CL 104 105 108 102 102 105 105  CO2 25 26 24 26 27 29 27   GLUCOSE 113* 154* 111* 111* 108* 89 95  BUN 12 15 16 15 12 11 12   CREATININE 1.04* 1.19* 1.11* 1.05* 1.03* 1.14* 1.13*  CALCIUM 9.4 9.4 9.0 8.9 8.9 9.3 9.6  MG  --  2.1  --   --   --   --   --   AST 27 33  --   --   --   --   --   ALT 19 24  --   --   --   --   --   ALKPHOS 63 65  --   --   --   --   --   BILITOT 0.6 0.7  --   --   --   --   --    ------------------------------------------------------------------------------------------------------------------ estimated creatinine clearance is 28 mL/min (by C-G formula based on Cr of 1.13). ------------------------------------------------------------------------------------------------------------------ No results for input(s): HGBA1C in the last 72 hours. ------------------------------------------------------------------------------------------------------------------ No results for input(s): CHOL, HDL, LDLCALC, TRIG, CHOLHDL, LDLDIRECT in the last 72 hours. ------------------------------------------------------------------------------------------------------------------ No results for input(s): TSH, T4TOTAL, T3FREE, THYROIDAB in the last 72 hours.  Invalid input(s): FREET3 ------------------------------------------------------------------------------------------------------------------ No  results for input(s): VITAMINB12, FOLATE, FERRITIN, TIBC, IRON, RETICCTPCT in the last 72 hours.  Coagulation profile No results for input(s): INR, PROTIME in the last 168 hours.  No results for input(s): DDIMER in the last 72 hours.  Cardiac Enzymes  Recent Labs Lab 08/31/15 0319 08/31/15 1015 08/31/15 1509  TROPONINI 0.03 <0.03 0.03    ------------------------------------------------------------------------------------------------------------------ Invalid input(s): POCBNP    Julie Stein D.O. on 09/05/2015 at 10:47 AM  Between 7am to 7pm - Pager - [phone]  After 7pm go to [email or web address] - password [email or web address]  And look for the night coverage person covering for me after hours  [facility] Office  [phone]

## 2015-09-06 ENCOUNTER — Inpatient Hospital Stay (HOSPITAL_COMMUNITY)
Admission: RE | Admit: 2015-09-06 | Discharge: 2015-09-10 | DRG: 057 | Disposition: A | Discharge status: Home Health | Payer: Medicare Other | Source: Intra-hospital | Attending: Physical Medicine & Rehabilitation | Admitting: Physical Medicine & Rehabilitation

## 2015-09-06 ENCOUNTER — Encounter (HOSPITAL_COMMUNITY): Payer: Self-pay | Admitting: *Deleted

## 2015-09-06 DIAGNOSIS — I69393 Ataxia following cerebral infarction: Secondary | ICD-10-CM

## 2015-09-06 DIAGNOSIS — E785 Hyperlipidemia, unspecified: Secondary | ICD-10-CM | POA: Diagnosis present

## 2015-09-06 DIAGNOSIS — H538 Other visual disturbances: Secondary | ICD-10-CM | POA: Diagnosis present

## 2015-09-06 DIAGNOSIS — R269 Unspecified abnormalities of gait and mobility: Secondary | ICD-10-CM

## 2015-09-06 DIAGNOSIS — I69354 Hemiplegia and hemiparesis following cerebral infarction affecting left non-dominant side: Secondary | ICD-10-CM | POA: Diagnosis present

## 2015-09-06 DIAGNOSIS — I5022 Chronic systolic (congestive) heart failure: Secondary | ICD-10-CM | POA: Diagnosis present

## 2015-09-06 DIAGNOSIS — I471 Supraventricular tachycardia: Secondary | ICD-10-CM | POA: Diagnosis present

## 2015-09-06 DIAGNOSIS — I69398 Other sequelae of cerebral infarction: Secondary | ICD-10-CM

## 2015-09-06 DIAGNOSIS — I2510 Atherosclerotic heart disease of native coronary artery without angina pectoris: Secondary | ICD-10-CM | POA: Diagnosis present

## 2015-09-06 DIAGNOSIS — I638 Other cerebral infarction: Secondary | ICD-10-CM

## 2015-09-06 DIAGNOSIS — I428 Other cardiomyopathies: Secondary | ICD-10-CM | POA: Diagnosis present

## 2015-09-06 DIAGNOSIS — I110 Hypertensive heart disease with heart failure: Secondary | ICD-10-CM | POA: Diagnosis present

## 2015-09-06 DIAGNOSIS — I6389 Other cerebral infarction: Secondary | ICD-10-CM | POA: Diagnosis present

## 2015-09-06 LAB — CBC
HCT: 31.4 % — ABNORMAL LOW (ref 36.0–46.0)
HEMOGLOBIN: 10.5 g/dL — AB (ref 12.0–15.0)
MCH: 24.1 pg — ABNORMAL LOW (ref 26.0–34.0)
MCHC: 33.4 g/dL (ref 30.0–36.0)
MCV: 72 fL — ABNORMAL LOW (ref 78.0–100.0)
Platelets: 274 10*3/uL (ref 150–400)
RBC: 4.36 MIL/uL (ref 3.87–5.11)
RDW: 16.2 % — ABNORMAL HIGH (ref 11.5–15.5)
WBC: 10.2 10*3/uL (ref 4.0–10.5)

## 2015-09-06 LAB — BASIC METABOLIC PANEL
Anion gap: 9 (ref 5–15)
BUN: 13 mg/dL (ref 6–20)
CHLORIDE: 107 mmol/L (ref 101–111)
CO2: 24 mmol/L (ref 22–32)
Calcium: 9.5 mg/dL (ref 8.9–10.3)
Creatinine, Ser: 1.12 mg/dL — ABNORMAL HIGH (ref 0.44–1.00)
GFR, EST AFRICAN AMERICAN: 53 mL/min — AB (ref >60)
GFR, EST NON AFRICAN AMERICAN: 46 mL/min — AB (ref >60)
Glucose, Bld: 87 mg/dL (ref 65–99)
POTASSIUM: 4.1 mmol/L (ref 3.5–5.1)
SODIUM: 140 mmol/L (ref 135–145)

## 2015-09-06 MED ORDER — CARVEDILOL 12.5 MG PO TABS: 12.5000 mg | ORAL_TABLET | Freq: Two times a day (BID) | ORAL | Status: DC

## 2015-09-06 MED ORDER — ONDANSETRON HCL 4 MG/2ML IJ SOLN: 4.0000 mg | Freq: Four times a day (QID) | INTRAMUSCULAR | Status: DC | PRN

## 2015-09-06 MED ORDER — ASPIRIN EC 81 MG PO TBEC
81.0000 mg | DELAYED_RELEASE_TABLET | Freq: Every day | ORAL | Status: DC
  Administered 2015-09-07 – 2015-09-10 (×4): 81 mg via ORAL
  Filled 2015-09-06 (×4): qty 1

## 2015-09-06 MED ORDER — SORBITOL 70 % SOLN: 30.0000 mL | Freq: Every day | Status: DC | PRN

## 2015-09-06 MED ORDER — LOSARTAN POTASSIUM 25 MG PO TABS
25.0000 mg | ORAL_TABLET | Freq: Every day | ORAL | Status: DC
  Administered 2015-09-07 – 2015-09-10 (×4): 25 mg via ORAL
  Filled 2015-09-06 (×4): qty 1

## 2015-09-06 MED ORDER — SPIRONOLACTONE 25 MG PO TABS
25.0000 mg | ORAL_TABLET | Freq: Every day | ORAL | Status: DC
  Administered 2015-09-07 – 2015-09-10 (×4): 25 mg via ORAL
  Filled 2015-09-06 (×4): qty 1

## 2015-09-06 MED ORDER — PNEUMOCOCCAL VAC POLYVALENT 25 MCG/0.5ML IJ INJ
0.5000 mL | INJECTION | INTRAMUSCULAR | Status: AC
  Administered 2015-09-07: 0.5 mL via INTRAMUSCULAR
  Filled 2015-09-06: qty 0.5

## 2015-09-06 MED ORDER — ACETAMINOPHEN 325 MG PO TABS
650.0000 mg | ORAL_TABLET | ORAL | Status: DC | PRN
  Administered 2015-09-09: 650 mg via ORAL
  Filled 2015-09-06: qty 2

## 2015-09-06 MED ORDER — ISOSORB DINITRATE-HYDRALAZINE 20-37.5 MG PO TABS
0.5000 | ORAL_TABLET | Freq: Three times a day (TID) | ORAL | Status: DC
  Administered 2015-09-06 – 2015-09-10 (×12): 0.5 via ORAL
  Filled 2015-09-06 (×15): qty 0.5

## 2015-09-06 MED ORDER — ASPIRIN 81 MG PO TBEC: 81.0000 mg | DELAYED_RELEASE_TABLET | Freq: Every day | ORAL | Status: DC

## 2015-09-06 MED ORDER — HEPARIN SODIUM (PORCINE) 5000 UNIT/ML IJ SOLN: 5000.0000 [IU] | Freq: Three times a day (TID) | INTRAMUSCULAR | Status: DC

## 2015-09-06 MED ORDER — INFLUENZA VAC SPLIT QUAD 0.5 ML IM SUSY
0.5000 mL | PREFILLED_SYRINGE | INTRAMUSCULAR | Status: AC
  Administered 2015-09-07: 0.5 mL via INTRAMUSCULAR
  Filled 2015-09-06: qty 0.5

## 2015-09-06 MED ORDER — ONDANSETRON HCL 4 MG PO TABS: 4.0000 mg | ORAL_TABLET | Freq: Four times a day (QID) | ORAL | Status: DC | PRN

## 2015-09-06 MED ORDER — ATORVASTATIN CALCIUM 80 MG PO TABS
80.0000 mg | ORAL_TABLET | Freq: Every day | ORAL | Status: DC
  Administered 2015-09-06 – 2015-09-09 (×4): 80 mg via ORAL
  Filled 2015-09-06 (×4): qty 1

## 2015-09-06 MED ORDER — NITROGLYCERIN 0.4 MG SL SUBL: 0.4000 mg | SUBLINGUAL_TABLET | SUBLINGUAL | Status: DC | PRN

## 2015-09-06 MED ORDER — HEPARIN SODIUM (PORCINE) 5000 UNIT/ML IJ SOLN
5000.0000 [IU] | Freq: Three times a day (TID) | INTRAMUSCULAR | Status: DC
  Administered 2015-09-06 – 2015-09-10 (×12): 5000 [IU] via SUBCUTANEOUS
  Filled 2015-09-06 (×12): qty 1

## 2015-09-06 MED ORDER — ISOSORB DINITRATE-HYDRALAZINE 20-37.5 MG PO TABS: 0.5000 | ORAL_TABLET | Freq: Three times a day (TID) | ORAL | Status: DC

## 2015-09-06 NOTE — Interval H&P Note (Signed)
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  The patient's history has been reviewed, patient examined, and there is no change in status.  Patient continues to be appropriate for intensive inpatient rehabilitation.  I have reviewed the patient's chart and labs.  Questions were answered to the patient's satisfaction. The PAPE has been reviewed and assessment remains appropriate.  Julie Stein Juba 09/06/2015, 10:07 PM

## 2015-09-06 NOTE — H&P (View-Only) (Signed)
Physical Medicine and Rehabilitation Admission H&P    Chief complaint: Weakness  HPI: Julie Stein is a 80 y.o. right handed female history of hypertension. Patient lives with grandson and his wife large extended supportive family. Independent prior to admission and active. One level home 6 steps to entry. Presented 08/26/2015 with sudden onset of shortness of breath noted recent bouts of diarrhea, congestion and cough. Chest x-ray shows significant pulmonary edema. EKG with frequent PVCs bigeminy 2 runs of SVT heart rate 200 bpm. She became hypotensive placed on amiodarone drip. Bedside echocardiogram with ejection fraction 20-25% diffuse hypokinesis, RV functioning well. Placed on broad-spectrum antibiotic suspect viral illness. Troponin mildly elevated 0.22 days 0.29. Cardiology service is consulted and underwent cardiac catheterization showing severe distal LAD stenosis moderate ostial large OMI stenosis. Advised medical management. Post catheterization noted left-sided weakness. MRI of the brain showed small volume patchy cortical and subcortical ischemic infarcts within the left frontal and right frontoparietal region with additional tiny cortical infarcts within the right occipital lobe. MRA of the head with no major vessel occlusion or stenosis. Placed on aspirin for CVA prophylaxis. Subcutaneous heparin for DVT prophylaxis. Physical therapy evaluation completed 08/29/2015 with recommendations of physical medicine rehabilitation consult. Patient was admitted for comprehensive rehabilitation program 08/29/2015. On 08/31/2015 developed some nonspecific chest pressure lasting perhaps an hour and resolve spontaneously. Later she was up to the bathroom urinating had a syncopal event noted asystole lasted approximately less than a minute. CODE BLUE was called was given at least 2-3 minutes at bedtime chest compressions and regained pulse became more alert and awake. She did not recall the incident. She  was discharged to acute care services for ongoing monitoring and workup. Troponin was 0.05. Cardiology follow-up. Chest x-ray showed questionable infiltrate right lower lobe probable right small pleural effusion similar to prior. EKG SVT with frequent PVCs. Question vagal episode associated with micturition versus arrhythmia versus orthostasis. Ongoing bouts of tachycardia SVT heart rate had been in the 180s and had been maintained on intravenous Cardizem.Lottie Rater EP study 09/05/2015 unable to trigger an arrhythmia. Rate has been controlled and advised to continue Coreg for now. Therapies have been reinitiated with progressive gains. Patient is admitted for comprehensive rehabilitation program.  ROS ROS Review of Systems  Constitutional: Negative for fever and chills.  HENT: Negative for hearing loss.  Eyes: Positive for blurred vision. Negative for double vision.  Respiratory: Positive for shortness of breath.  Cardiovascular: Negative for chest pain and palpitations.  Gastrointestinal: Negative for vomiting.  Genitourinary: Negative for dysuria and hematuria.  Skin: Negative for rash.  Neurological: Negative for seizures, loss of consciousness and headaches.  All other systems reviewed and are negative Past Medical History  Diagnosis Date  . Hypertension   . CHF (congestive heart failure) (HCC)   . Stroke [facility])    Past Surgical History  Procedure Laterality Date  . Cardiac catheterization N/A 08/27/2015    Procedure: Right/Left Heart Cath and Coronary Angiography;  Surgeon: Laurey Morale, MD;  Location: [facility] INVASIVE CV LAB;  Service: Cardiovascular;  Laterality: N/A;  . Knee surgery     Family History  Problem Relation Age of Onset  . Hypertension Daughter    Social History:  reports that she has never smoked. She does not have any smokeless tobacco history on file. She reports that she does not drink alcohol or use illicit drugs. Allergies:  Allergies  Allergen  Reactions  . Demerol [Meperidine]     intolerance   Medications  Prior to Admission  Medication Sig Dispense Refill  . aspirin EC 325 MG EC tablet Take 1 tablet (325 mg total) by mouth daily. 30 tablet 6  . atorvastatin (LIPITOR) 80 MG tablet Take 1 tablet (80 mg total) by mouth daily at 6 PM. 30 tablet 6  . azithromycin (ZITHROMAX) 250 MG tablet Take 1 tablet (250 mg total) by mouth daily. 3 each 0  . carvedilol (COREG) 12.5 MG tablet Take 1 tablet (12.5 mg total) by mouth 2 (two) times daily with a meal. 60 tablet 6  . COLLAGEN PO Take 1 capsule by mouth daily.    . isosorbide-hydrALAZINE (BIDIL) 20-37.5 MG tablet Take 1 tablet by mouth 3 (three) times daily. 90 tablet 6  . Multiple Vitamins-Minerals (MULTIVITAMIN) tablet Take 1 tablet by mouth daily.    . sacubitril-valsartan (ENTRESTO) 24-26 MG Take 1 tablet by mouth 2 (two) times daily. 60 tablet 6    Home: Home Living Family/patient expects to be discharged to:: Inpatient rehab Living Arrangements: Spouse/significant other, Children Available Help at Discharge: Family, Available 24 hours/day (grandson and his wife in school during day, but other family members able to assist ) Type of Home: House Home Access: Stairs to enter [employer] of Steps: 6 Entrance Stairs-Rails: Right, Left, Can reach both Home Layout: One level Bathroom Shower/Tub: Gaffer, Architectural technologist: Standard Home Equipment: None Additional Comments: Pt was gardening, doing yard work, cleaned house - was fully independent PTA.    Functional History: Prior Function Level of Independence: Independent (prior to initial admission) Comments: enjoys gardening  Functional Status:  Mobility: Bed Mobility Overal bed mobility: Needs Assistance Bed Mobility: Supine to Sit Supine to sit: Supervision General bed mobility comments: Cues to initiate; supervision for safety Transfers Overall transfer level: Needs assistance Equipment used:  2 person hand held assist Transfers: Sit to/from Stand Sit to Stand: Mod assist, +2 physical assistance General transfer comment: Noted adequate strenght for power-up, but decr coordination, trunk instability, and difficulty controlling center of mass over base of support Ambulation/Gait Ambulation/Gait assistance: +2 physical assistance, Mod assist Ambulation Distance (Feet): 12 Feet Assistive device: 2 person hand held assist Gait Pattern/deviations: Ataxic General Gait Details: Needing bil UE support for stability in upright standing and walking activities    ADL:    Cognition: Cognition Overall Cognitive Status: Within Functional Limits for tasks assessed Orientation Level: Oriented X4 Cognition Arousal/Alertness: Awake/alert Behavior During Therapy: WFL for tasks assessed/performed Overall Cognitive Status: Within Functional Limits for tasks assessed  Physical Exam: Blood pressure 126/67, pulse 75, temperature 98.8 F (37.1 C), temperature source Oral, resp. rate 16, height $RemoveBe'4\' 11"'[id number]$  (1.499 m), weight 48.399 kg (106 lb 11.2 oz), SpO2 99 %. Physical Exam Constitutional: She is oriented to person, place, and time. She appears well-developed and well-nourished.  HENT:  Head: Normocephalic and atraumatic.  Eyes: Conjunctivae and EOM are normal.  Negative nystagmus  Neck: Normal range of motion. Neck supple. No thyromegaly present.  Cardiovascular: Normal rate and regular rhythm.  Respiratory: Effort normal and breath sounds normal. No respiratory distress.  GI: Soft. Bowel sounds are normal. She exhibits no distension. There is no tenderness.  Musculoskeletal: She exhibits no edema.  PROM WNL B/l UE 4+/5 grossly RLE 4+/5 grossly LLE hip flex 4/5, ankle dorsi/plantar flexion 4+/5  Neurological: She is alert and oriented to person, place, and time. She has normal reflexes.  Makes good eye contact with examiner and follows full commands. Fair awareness of deficits Left  trunk lean Sensation to  light touch grossly intact throughout  Skin: Skin is warm and dry.  Psychiatric: She has a normal mood and affect. Her behavior is normal   Results for orders placed or performed during the hospital encounter of 08/31/15 (from the past 48 hour(s))  Basic metabolic panel     Status: Abnormal   Collection Time: 09/02/15  4:02 AM  Result Value Ref Range   Sodium 137 135 - 145 mmol/L   Potassium 3.8 3.5 - 5.1 mmol/L   Chloride 102 101 - 111 mmol/L   CO2 26 22 - 32 mmol/L   Glucose, Bld 111 (H) 65 - 99 mg/dL   BUN 15 6 - 20 mg/dL   Creatinine, Ser 1.05 (H) 0.44 - 1.00 mg/dL   Calcium 8.9 8.9 - 10.3 mg/dL   GFR calc non Af Amer 50 (L) >60 mL/min   GFR calc Af Amer 57 (L) >60 mL/min    Comment: (NOTE) The eGFR has been calculated using the CKD EPI equation. This calculation has not been validated in all clinical situations. eGFR's persistently <60 mL/min signify possible Chronic Kidney Disease.    Anion gap 9 5 - 15  CBC     Status: Abnormal   Collection Time: 09/02/15  4:02 AM  Result Value Ref Range   WBC 7.9 4.0 - 10.5 K/uL   RBC 4.18 3.87 - 5.11 MIL/uL   Hemoglobin 9.6 (L) 12.0 - 15.0 g/dL   HCT 30.0 (L) 36.0 - 46.0 %   MCV 71.8 (L) 78.0 - 100.0 fL   MCH 23.0 (L) 26.0 - 34.0 pg   MCHC 32.0 30.0 - 36.0 g/dL   RDW 15.8 (H) 11.5 - 15.5 %   Platelets 246 150 - 400 K/uL  Basic metabolic panel     Status: Abnormal   Collection Time: 09/03/15  3:20 AM  Result Value Ref Range   Sodium 136 135 - 145 mmol/L   Potassium 3.7 3.5 - 5.1 mmol/L   Chloride 102 101 - 111 mmol/L   CO2 27 22 - 32 mmol/L   Glucose, Bld 108 (H) 65 - 99 mg/dL   BUN 12 6 - 20 mg/dL   Creatinine, Ser 1.03 (H) 0.44 - 1.00 mg/dL   Calcium 8.9 8.9 - 10.3 mg/dL   GFR calc non Af Amer 51 (L) >60 mL/min   GFR calc Af Amer 59 (L) >60 mL/min    Comment: (NOTE) The eGFR has been calculated using the CKD EPI equation. This calculation has not been validated in all clinical  situations. eGFR's persistently <60 mL/min signify possible Chronic Kidney Disease.    Anion gap 7 5 - 15   No results found.     Medical Problem List and Plan: 1. Functional deficits secondary to watershed pattern infarct likely due to hypoperfusion after initial cardiac catheterization 2.  DVT Prophylaxis/Anticoagulation: Subcutaneous heparin. Monitor platelet counts in any signs of bleeding 3. Pain Management: Tylenol as needed 4. CAD/syncope. Continue aspirin therapy 5. Neuropsych: This patient is capable of making decisions on her own behalf. 6. Skin/Wound Care: Routine skin checks 7. Fluids/Electrolytes/Nutrition: Routine I&O with follow-up chemistries 8. Hypertension/SVT. EP study 09/05/2015 unable to trigger arrhythmia. Coreg 12.5 mg twice a day, Bidil 20-37.5 mg 3 times a day, Cozaar 25 mg daily, Aldactone 12.5 mg daily. Monitor of increased mobility 9. Hyperlipidemia. Lipitor    Post Admission Physician Evaluation: 1. 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 . 2. Patient is admitted to receive collaborative, interdisciplinary care between  the physiatrist, rehab nursing staff, and therapy team. 3. Patient's level of medical complexity and substantial therapy needs in context of that medical necessity cannot be provided at a lesser intensity of care such as a SNF. 4. Patient has experienced substantial functional loss from his/her baseline which was documented above under the "Functional History" and "Functional Status" headings.  Judging by the patient's diagnosis, physical exam, and functional history, the patient has potential for functional progress which will result in measurable gains while on inpatient rehab.  These gains will be of substantial and practical use upon discharge  in facilitating mobility and self-care at the household level. 5. Physiatrist will provide 24 hour management of medical needs as well as oversight of the therapy  plan/treatment and provide guidance as appropriate regarding the interaction of the two. 6. 24 hour rehab nursing will assist with safety, disease management, medication administration, pain management and patient education, and help integrate therapy concepts, techniques,education, etc. 1. PT will assess and treat for/with: Lower extremity strength, range of motion, stamina, balance, functional mobility, safety, adaptive techniques and equipment, woundcare, coping skills, pain control, and stroke education. Goals are: Supervision. 7. OT will assess and treat for/with: ADL's, functional mobility, safety, upper extremity strength, adaptive techniques and equipment, wound mgt, ego support, and, stroke education, and community reintegration. Goals are: Supervision/Mod I. Therapy may proceed with showering this patient. 8. Case Management and Social Worker will assess and treat for psychological issues and discharge planning. 9. Team conference will be held weekly to assess progress toward goals and to determine barriers to discharge. 10. Patient will receive at least 3 hours of therapy per day at least 5 days per week. 11. ELOS: ~7 days.       12. Prognosis:  good  Delice Lesch, MD  09/03/2015

## 2015-09-06 NOTE — Progress Notes (Signed)
1738 09/06/15 nsg Patient admitted to inpatient rehab per wheelchair accompanied by RN's and daughter alert and oriented patient. Oriented to unit set up. Fall contract explained and signed by patient.

## 2015-09-06 NOTE — Progress Notes (Signed)
Ankit Karis Juba, MD Physician Signed Physical Medicine and Rehabilitation Consult Note 08/29/2015 12:48 PM  Related encounter: ED to Hosp-Admission (Discharged) from 08/26/2015 in [facility]    Expand All Collapse All        Physical Medicine and Rehabilitation Consult Reason for Consult: Watershed pattern infarct Referring Physician: [facility]   HPI: Julie Stein is a 80 y.o. right handed female history of hypertension. Patient lives with grandson and his wife large extended supportive family. Independent prior to admission and active. One level home 6 steps to entry. Presented 08/26/2015 with sudden onset of shortness of breath noted recent bouts of diarrhea, congestion and cough. Chest x-ray shows significant pulmonary edema. EKG with frequent PVCs bigeminy 2 runs of SVT heart rate 200 bpm. She became hypotensive placed on amiodarone drip. Bedside echocardiogram with ejection fraction 20-25% diffuse hypokinesis, RV functioning well. Placed on broad-spectrum antibiotic suspect viral illness. Troponin mildly elevated 0.22 days 0.29. Cardiology service is consulted and underwent cardiac catheterization showing severe distal LAD stenosis moderate ostial large OMI stenosis. Advised medical management. Post catheterization noted left-sided weakness. MRI of the brain showed small volume patchy cortical and subcortical ischemic infarcts within the left frontal and right frontoparietal region with additional tiny cortical infarcts within the right occipital lobe. MRA of the head with no major vessel occlusion or stenosis. Placed on aspirin for CVA prophylaxis. Maintained on contact droplet precautions. Physical therapy evaluation completed 08/29/2015 with recommendations of physical medicine rehabilitation consult.   Review of Systems  Constitutional: Negative for fever and chills.  HENT: Negative for hearing loss.  Eyes: Positive for blurred vision. Negative for  double vision.  Respiratory: Positive for shortness of breath.  Cardiovascular: Negative for chest pain and palpitations.  Gastrointestinal: Negative for vomiting.  Genitourinary: Negative for dysuria and hematuria.  Skin: Negative for rash.  Neurological: Negative for seizures, loss of consciousness and headaches.  All other systems reviewed and are negative.  Past Medical History  Diagnosis Date  . Hypertension    Past Surgical History  Procedure Laterality Date  . Cardiac catheterization N/A 08/27/2015    Procedure: Right/Left Heart Cath and Coronary Angiography; Surgeon: Laurey Morale, MD; Location: [facility] INVASIVE CV LAB; Service: Cardiovascular; Laterality: N/A;   History reviewed. No pertinent family history. Social History:  reports that she has never smoked. She does not have any smokeless tobacco history on file. She reports that she does not drink alcohol or use illicit drugs. Allergies:  Allergies  Allergen Reactions  . Demerol [Meperidine]     intolerance   Medications Prior to Admission  Medication Sig Dispense Refill  . COLLAGEN PO Take 1 capsule by mouth daily.    . Multiple Vitamins-Minerals (MULTIVITAMIN) tablet Take 1 tablet by mouth daily.      Home: Home Living Family/patient expects to be discharged to:: Private residence Living Arrangements: Other relatives (lives with grandson and his wife, has a large family) Available Help at Discharge: Family, Available 24 hours/day Type of Home: House Home Access: Stairs to enter [employer] of Steps: 6 Entrance Stairs-Rails: Right, Left, Can reach both Home Layout: One level Bathroom Shower/Tub: Health visitor: Standard Bathroom Accessibility: Yes Home Equipment: None Additional Comments: Pt was gardening, doing yard work, cleaned house - was fully independent PTA.   Functional History: Prior Function Level of Independence:  Independent Functional Status:  Mobility: Bed Mobility General bed mobility comments: Pt in chair on arrival.  Transfers Overall transfer level: Needs assistance  Equipment used: Rolling walker (2 wheeled) Transfers: Sit to/from Stand Sit to Stand: Mod assist, +2 physical assistance General transfer comment: Pt able to perform sit to stand with mod assist and cues as pt leans to left and has difficulty with coordination of movement on left hemibody.  Ambulation/Gait Ambulation/Gait assistance: Mod assist, +2 physical assistance Ambulation Distance (Feet): 18 Feet Assistive device: Rolling walker (2 wheeled), 2 person hand held assist Gait Pattern/deviations: Step-to pattern, Decreased stride length, Decreased step length - left, Decreased stance time - left, Decreased stance time - right, Decreased weight shift to right, Decreased weight shift to left, Ataxic, Staggering left, Staggering right, Trunk flexed, Narrow base of support General Gait Details: Pt with very poor postural stability overall. Pt unable to maintain upright trunk with left lateral lean throughout. Pt needed cues and assist to move left LE as pt could not coordinate the LE. Also needed assist to weight shift to right to unweight left hemibody. Question whether pt had decr awareness of right hemibody as well with questionable visual changes in right visual field. With ambulation, PT giving heavy assist at times to keep pt upright. Initially pt was ambulating without device first 6 steps and then used RW. Even with RW, pt still required extensive assist. Pt's family in room and PT had family assist pt with ambulation so they were aware of the assist given as intially they were convinced pt could go home, however once they tried to assist pt, they were agreeable that pt needs further therapy prior to d/c home.  Gait velocity interpretation: Below normal speed for age/gender    ADL:    Cognition: Cognition Overall  Cognitive Status: Impaired/Different from baseline Orientation Level: Oriented X4 Cognition Arousal/Alertness: Awake/alert Behavior During Therapy: Flat affect Overall Cognitive Status: Impaired/Different from baseline Area of Impairment: Following commands, Safety/judgement, Awareness, Problem solving Following Commands: Follows one step commands inconsistently Safety/Judgement: Decreased awareness of safety, Decreased awareness of deficits Awareness: Intellectual Problem Solving: Difficulty sequencing, Requires verbal cues, Requires tactile cues General Comments: Pt had difficulty following commands with delayed processing of information at times. Pt had trouble following commands for eye movements.   Blood pressure 140/94, pulse 113, temperature 97.7 F (36.5 C), temperature source Oral, resp. rate 15, height  (1.499 m), weight 49.6 kg (109 lb 5.6 oz), SpO2 94 %. Physical Exam  Vitals reviewed. Constitutional: She is oriented to person, place, and time. She appears well-developed and well-nourished.  HENT:  Head: Normocephalic and atraumatic.  Eyes: Conjunctivae and EOM are normal.  Negative nystagmus  Neck: Normal range of motion. Neck supple. No thyromegaly present.  Cardiovascular: Normal rate and regular rhythm.  Respiratory: Effort normal and breath sounds normal. No respiratory distress.  GI: Soft. Bowel sounds are normal. She exhibits no distension. There is no tenderness.  Musculoskeletal: She exhibits no edema.  PROM WNL B/l UE 4+/5 grossly RLE 4+/5 grossly LLE hip flex 4/5, ankle dorsi/plantar flexion 4+/5  Neurological: She is alert and oriented to person, place, and time. She has normal reflexes.  Makes good eye contact with examiner and follows full commands. Fair awareness of deficits Left trunk lean Sensation to light touch grossly intact throughout  Skin: Skin is warm and dry.  Psychiatric: She has a normal mood and affect. Her behavior is normal.      Lab Results Last 24 Hours    Results for orders placed or performed during the hospital encounter of 08/26/15 (from the past 24 hour(s))  Glucose, capillary Status: Abnormal  Collection Time: 08/28/15 4:30 PM  Result Value Ref Range   Glucose-Capillary 138 (H) 65 - 99 mg/dL   Comment 1 Capillary Specimen   Glucose, capillary Status: Abnormal   Collection Time: 08/28/15 8:39 PM  Result Value Ref Range   Glucose-Capillary 130 (H) 65 - 99 mg/dL   Comment 1 Capillary Specimen   Glucose, capillary Status: None   Collection Time: 08/28/15 11:42 PM  Result Value Ref Range   Glucose-Capillary 95 65 - 99 mg/dL   Comment 1 Capillary Specimen   Glucose, capillary Status: Abnormal   Collection Time: 08/29/15 4:28 AM  Result Value Ref Range   Glucose-Capillary 123 (H) 65 - 99 mg/dL   Comment 1 Venous Specimen   CBC Status: Abnormal   Collection Time: 08/29/15 4:42 AM  Result Value Ref Range   WBC 12.2 (H) 4.0 - 10.5 K/uL   RBC 4.05 3.87 - 5.11 MIL/uL   Hemoglobin 9.5 (L) 12.0 - 15.0 g/dL   HCT 16.1 (L) 09.6 - 04.5 %   MCV 72.6 (L) 78.0 - 100.0 fL   MCH 23.5 (L) 26.0 - 34.0 pg   MCHC 32.3 30.0 - 36.0 g/dL   RDW 40.9 (H) 81.1 - 91.4 %   Platelets 225 150 - 400 K/uL  Basic metabolic panel Status: Abnormal   Collection Time: 08/29/15 4:42 AM  Result Value Ref Range   Sodium 139 135 - 145 mmol/L   Potassium 3.7 3.5 - 5.1 mmol/L   Chloride 103 101 - 111 mmol/L   CO2 26 22 - 32 mmol/L   Glucose, Bld 128 (H) 65 - 99 mg/dL   BUN 14 6 - 20 mg/dL   Creatinine, Ser 7.82 (H) 0.44 - 1.00 mg/dL   Calcium 9.2 8.9 - 95.6 mg/dL   GFR calc non Af Amer 46 (L) >60 mL/min   GFR calc Af Amer 53 (L) >60 mL/min   Anion gap 10 5 - 15  Glucose, capillary Status: None   Collection Time: 08/29/15 7:37 AM  Result  Value Ref Range   Glucose-Capillary 75 65 - 99 mg/dL   Comment 1 Capillary Specimen   Glucose, capillary Status: Abnormal   Collection Time: 08/29/15 11:12 AM  Result Value Ref Range   Glucose-Capillary 112 (H) 65 - 99 mg/dL   Comment 1 Capillary Specimen       Imaging Results (Last 48 hours)    [name] Wo Contrast  08/28/2015 CLINICAL DATA: Initial evaluation for transient left-sided weakness following an episode of unresponsiveness and hypotension. EXAM: MRI HEAD WITHOUT CONTRAST MRA HEAD WITHOUT CONTRAST TECHNIQUE: Multiplanar, multiecho pulse sequences of the brain and surrounding structures were obtained without intravenous contrast. Angiographic images of the head were obtained using MRA technique without contrast. COMPARISON: Prior CT from 08/27/2015. FINDINGS: MRI HEAD FINDINGS Diffuse prominence of the CSF containing spaces is compatible with generalized age-related cerebral atrophy. Patchy and confluent T2/FLAIR hyperintensity within the periventricular and deep white matter both cerebral hemispheres most consistent with chronic small vessel ischemic disease. Small remote lacunar infarct within the periventricular white matter of the left corona radiata. There is a few small foci of patchy restricted diffusion within the high left frontal lobe and right frontoparietal regions bilaterally (series 4, image 33, 35). Additional tiny punctate cortical infarct more inferiorly within the right occipital lobe (series 4, image 26). Associated signal loss seen on corresponding ADC map. No associated hemorrhage or mass effect. Bodies consistent with small acute ischemic infarcts. Given the distribution of these findings,  underlying watershed etiology is favored. Normal intravascular flow voids are maintained. Additional scattered subcentimeter foci of susceptibility artifact seen in within the peripheral cortices of the bilateral cerebral hemispheres on gradient echo  sequence, consistent with small chronic micro hemorrhages. While these could potentially be related underlying hypertension, possible amyloid angiopathy could be considered given their somewhat peripheral distribution. No mass lesion, midline shift, or mass effect. No hydrocephalus. No extra-axial fluid collection. Craniocervical junction within normal limits. Mild degenerative spondylolysis noted within the visualized upper cervical spine. Pituitary gland normal. Thinning of the anterior body of the corpus callosum noted, which may related to remote ischemia. No acute abnormality about the orbits. Mild right-sided exophthalmos. Paranasal sinuses are clear. No mastoid effusion. Inner ear structures within normal limits. Bone marrow signal intensity within normal limits. No scalp soft tissue abnormality. MRA HEAD FINDINGS ANTERIOR CIRCULATION: Visualized distal cervical segments of the internal carotid arteries are patent with antegrade flow. Petrous segments widely patent. Scattered multi focal atheromatous irregularity within the cavernous and supraclinoid left ICA. There is a short-segment moderate stenosis at the proximal supraclinoid left ICA (series 5, image 72). Cavernous and supraclinoid right ICA widely patent. Right A1 segment widely patent. Left A1 segment slightly hypoplastic. Anterior communicating artery patent. Atheromatous irregularity within the anterior cerebral arteries bilaterally. M1 segments widely patent without stenosis or occlusion. Atheromatous irregularity within the left M1 segment. MCA bifurcations within normal limits. MCA branches symmetric bilaterally. Distal small vessel disease present within the MCA branches bilaterally. POSTERIOR CIRCULATION: Vertebral arteries patent to the vertebrobasilar junction. Right vertebral artery slightly diminutive. Posterior inferior cerebral arteries not well evaluated on this exam. Basilar artery widely patent. Small left anterior inferior cerebral  artery noted. Superior cerebellar arteries patent. Both sub posterior cerebral arteries arise from the basilar artery and are well opacified to their distal aspects. Distal small vessel disease within the PCA branches bilaterally. There is a more focal short-segment moderate stenosis within the proximal right P2 segment (series 505, image 15). No aneurysm or vascular malformation. IMPRESSION: MRI HEAD IMPRESSION: 1. Small volume patchy cortical and subcortical ischemic infarcts within the left frontal and right frontoparietal region, with additional tiny cortical infarct within the right occipital lobe. Given the distribution of these infarcts, an underlying watershed etiology is favored. No associated hemorrhage or mass effect. 2. Scattered small chronic micro hemorrhages as above. While these might be related to chronic underlying hypertension, possible amyloid angiopathy could also be considered given their somewhat peripheral distribution. 3. Atrophy with chronic microvascular ischemic disease. MRA HEAD IMPRESSION: 1. No large vessel or proximal arterial branch occlusion identified. 2. Short-segment moderate stenosis within the supraclinoid left ICA. 3. Short-segment moderate stenosis within the proximal right P2 segment. 4. Distal small vessel atheromatous disease within the MCA, ACA, and PCA branches bilaterally Electronically Signed By: Rise Mu M.D. On: 08/28/2015 02:26   Mr Brain Wo Contrast  08/28/2015 CLINICAL DATA: Initial evaluation for transient left-sided weakness following an episode of unresponsiveness and hypotension. EXAM: MRI HEAD WITHOUT CONTRAST MRA HEAD WITHOUT CONTRAST TECHNIQUE: Multiplanar, multiecho pulse sequences of the brain and surrounding structures were obtained without intravenous contrast. Angiographic images of the head were obtained using MRA technique without contrast. COMPARISON: Prior CT from 08/27/2015. FINDINGS: MRI HEAD FINDINGS Diffuse prominence of  the CSF containing spaces is compatible with generalized age-related cerebral atrophy. Patchy and confluent T2/FLAIR hyperintensity within the periventricular and deep white matter both cerebral hemispheres most consistent with chronic small vessel ischemic disease. Small remote lacunar infarct within the periventricular white  matter of the left corona radiata. There is a few small foci of patchy restricted diffusion within the high left frontal lobe and right frontoparietal regions bilaterally (series 4, image 33, 35). Additional tiny punctate cortical infarct more inferiorly within the right occipital lobe (series 4, image 26). Associated signal loss seen on corresponding ADC map. No associated hemorrhage or mass effect. Bodies consistent with small acute ischemic infarcts. Given the distribution of these findings, underlying watershed etiology is favored. Normal intravascular flow voids are maintained. Additional scattered subcentimeter foci of susceptibility artifact seen in within the peripheral cortices of the bilateral cerebral hemispheres on gradient echo sequence, consistent with small chronic micro hemorrhages. While these could potentially be related underlying hypertension, possible amyloid angiopathy could be considered given their somewhat peripheral distribution. No mass lesion, midline shift, or mass effect. No hydrocephalus. No extra-axial fluid collection. Craniocervical junction within normal limits. Mild degenerative spondylolysis noted within the visualized upper cervical spine. Pituitary gland normal. Thinning of the anterior body of the corpus callosum noted, which may related to remote ischemia. No acute abnormality about the orbits. Mild right-sided exophthalmos. Paranasal sinuses are clear. No mastoid effusion. Inner ear structures within normal limits. Bone marrow signal intensity within normal limits. No scalp soft tissue abnormality. MRA HEAD FINDINGS ANTERIOR CIRCULATION: Visualized  distal cervical segments of the internal carotid arteries are patent with antegrade flow. Petrous segments widely patent. Scattered multi focal atheromatous irregularity within the cavernous and supraclinoid left ICA. There is a short-segment moderate stenosis at the proximal supraclinoid left ICA (series 5, image 72). Cavernous and supraclinoid right ICA widely patent. Right A1 segment widely patent. Left A1 segment slightly hypoplastic. Anterior communicating artery patent. Atheromatous irregularity within the anterior cerebral arteries bilaterally. M1 segments widely patent without stenosis or occlusion. Atheromatous irregularity within the left M1 segment. MCA bifurcations within normal limits. MCA branches symmetric bilaterally. Distal small vessel disease present within the MCA branches bilaterally. POSTERIOR CIRCULATION: Vertebral arteries patent to the vertebrobasilar junction. Right vertebral artery slightly diminutive. Posterior inferior cerebral arteries not well evaluated on this exam. Basilar artery widely patent. Small left anterior inferior cerebral artery noted. Superior cerebellar arteries patent. Both sub posterior cerebral arteries arise from the basilar artery and are well opacified to their distal aspects. Distal small vessel disease within the PCA branches bilaterally. There is a more focal short-segment moderate stenosis within the proximal right P2 segment (series 505, image 15). No aneurysm or vascular malformation. IMPRESSION: MRI HEAD IMPRESSION: 1. Small volume patchy cortical and subcortical ischemic infarcts within the left frontal and right frontoparietal region, with additional tiny cortical infarct within the right occipital lobe. Given the distribution of these infarcts, an underlying watershed etiology is favored. No associated hemorrhage or mass effect. 2. Scattered small chronic micro hemorrhages as above. While these might be related to chronic underlying hypertension, possible  amyloid angiopathy could also be considered given their somewhat peripheral distribution. 3. Atrophy with chronic microvascular ischemic disease. MRA HEAD IMPRESSION: 1. No large vessel or proximal arterial branch occlusion identified. 2. Short-segment moderate stenosis within the supraclinoid left ICA. 3. Short-segment moderate stenosis within the proximal right P2 segment. 4. Distal small vessel atheromatous disease within the MCA, ACA, and PCA branches bilaterally Electronically Signed By: Rise Mu M.D. On: 08/28/2015 02:26   Dg Chest Port 1 View  08/28/2015 CLINICAL DATA: Shortness of breath. Pulmonary edema. EXAM: PORTABLE CHEST 1 VIEW COMPARISON: 08/26/2015. FINDINGS: Left IJ line in stable position. Cardiomegaly with bilateral pulmonary alveolar infiltrates. Slight improvement. Persistent small  pleural effusions. No pneumothorax. IMPRESSION: 1. Left IJ line in stable position. 2. Cardiomegaly with persistent bilateral pulmonary infiltrates, partial clearing from prior exam. Persistent small pleural effusions. Electronically Signed By: Maisie Fus Register On: 08/28/2015 07:18     Assessment/Plan: Pt meets criteria for admission to inpatient rehab today. Pt currently Mod Assist +2 with PT for most tasks. Would anticipate pt would reach supervision level within 7-10 days. Will admit today.    Maryla Morrow, MD 08/29/2015       Revision History     Date/Time User Provider Type Action   08/29/2015 3:00 PM Ankit Karis Juba, MD Physician Sign   08/29/2015 1:14 PM Charlton Amor, PA-C Physician Assistant Pend   View Details Report       Routing History     Date/Time From To Method   08/29/2015 3:00 PM Ankit Karis Juba, MD Ankit Karis Juba, MD In [facility]   08/29/2015 3:00 PM Ankit Karis Juba, MD No Pcp Per Patient In Basket

## 2015-09-06 NOTE — Progress Notes (Signed)
Rehab admissions - I have talked with patient and her daughter.  Patient doing well but daughters worried about cardiac status.  They would like patient to return to inpatient rehab at least for a short length of stay to be sure patient's rhythm stays stable with progressed activity.  Bed available and will admit to acute inpatient rehab today.  Call me for questions.  #[phone]

## 2015-09-06 NOTE — Progress Notes (Signed)
1844 09/06/15 nursing  BP 180/101 machine ; manual recheck 178/100. Pam PA notified. New orders noted.

## 2015-09-06 NOTE — Progress Notes (Signed)
Report given to inpatient rehab. Patient belongings packed by family.

## 2015-09-06 NOTE — Discharge Summary (Signed)
Physician Discharge Summary  Julie Stein VHQ:[id number] DOB: Jun 28, 1935 DOA: 08/31/2015  PCP: No PCP Per Patient  Admit date: 08/31/2015 Discharge date: 09/06/2015  Time spent: 45 minutes  Recommendations for Outpatient Follow-up:  Patient will be discharged to inpatient rehab.  Continue physical and occupational therapy as recommended by rehab. Patient will need to follow up with primary care provider within one week of discharge.  Patient should continue medications as prescribed.  Patient should follow a low sodium diet.   Discharge Diagnoses:  Syncope SVT Chronic systolic heart failure Microcytic anemia Chest pain History of CVA Essential hypertension Chronic kidney disease, stage III  Discharge Condition: Stable  Diet recommendation: Low sodium   Filed Weights   09/04/15 0603 09/05/15 0533 09/06/15 0505  Weight: 48.172 kg (106 lb 3.2 oz) 48.036 kg (105 lb 14.4 oz) 48.172 kg (106 lb 3.2 oz)    History of present illness:  on 08/31/2015 by Dr. Delrae Sawyers [name] is a 80 y.o. female who was recently admitted for acute pulmonary edema with 2-D echo showing EF of 20-25% and cardiac cath was done and was planned to be managed medically and during hospitalization patient also had left-sided weakness MRI showed stroke and patient was eventually discharged to a rehabilitation 2 days ago. In the rehabilitation yesterday patient started developing some chest pressure which lasted for 1 hour and resolved spontaneously initially but later the chest pressure again developed late in the evening which also resolved. Later when patient was going to the bathroom and urinating patient had a syncopal episode which lasted for less than a minute. CODE BLUE was called and patient was given at least 2-3 at bedtime compression following which patient regained pulse and became more alert and awake. Patient does not recall the incident. Patient is currently chest pain-free and on exam nonfocal.  Patient is admitted for syncopal episode. Reviewand is found to be mildly hypotensive early in the day. Presently is normotensive. Patient denies any palpitations or shortness of breath. Patient's repeat chest x-ray and labs are pending. EKG shows normal sinus rhythm with PVCs. Patient during last admission was briefly in SVT and required amiodarone infusion.  Hospital Course:  Syncope -Possibly secondary to SVT versus vasovagal as episode did occur on bedside commode -Cardiology consulted and appreciated. Patient had EP study on 10/19, however unable to trigger arrhythmia  SVT -Patient had EP study however unable to trigger arrhythmia -Cardiology recommended Coreg  Chronic systolic heart failure -Currently euvolemic -Echocardiogram 08/26/2015 showed EF of 20-25% with LVH -Continue Coreg, losartan, spironolactone, Bidil -Continue monitor intake and output, daily weights  Microcytic anemia -Hemoglobin appears to be at baseline, continue to monitor CBC  Chest pain  -Likely musculoskeletal, patient did have chest compressions -No further chest pain today -Continue aspirin, statin, Coreg, Cozaar  History of CVA -Continue aspirin and statin, blood pressure control -Patient will likely need to be discharged back to inpatient rehabilitation once stable  Essential hypertension -Continue Coreg, Cozaar, bidil, spironolactone  Chronic kidney disease, stage III -Creatinine currently stable, continue to monitor BMP  Procedures  EP study  Consults  Cardiology/EP  Discharge Exam: Filed Vitals:   09/06/15 0933  BP: 143/96  Pulse: 96  Temp:   Resp:    Exam  General: Well developed, well nourished, NAD, appears stated age  HEENT: NCAT, mucous membranes moist.   Cardiovascular: S1 S2 auscultated, no rubs, murmurs or gallops. Regular rate and rhythm.  Respiratory: Clear to auscultation bilaterally  Abdomen: Soft, nontender, nondistended, + bowel sounds  Extremities: warm  dry without cyanosis clubbing or edema  Neuro: AAOx3, cranial nerves grossly intact. Right sided weakness.  Psych: Normal affect and demeanor  Discharge Instructions      Discharge Instructions    Call MD for:  difficulty breathing, headache or visual disturbances    Complete by:  As directed      Call MD for:  temperature >100.4    Complete by:  As directed      Diet - low sodium heart healthy    Complete by:  As directed      Discharge instructions    Complete by:  As directed   Pt will need telemetry at CIR or life vest per cardiology recommendations.     Discharge instructions    Complete by:  As directed   Patient will be discharged to inpatient rehab.  Continue physical and occupational therapy as recommended by rehab. Patient will need to follow up with primary care provider within one week of discharge.  Patient should continue medications as prescribed.  Patient should follow a low sodium diet.     Increase activity slowly    Complete by:  As directed             Medication List    STOP taking these medications        azithromycin 250 MG tablet  Commonly known as:  ZITHROMAX     COLLAGEN PO     sacubitril-valsartan 24-26 MG  Commonly known as:  ENTRESTO      TAKE these medications        aspirin 81 MG EC tablet  Take 1 tablet (81 mg total) by mouth daily.     atorvastatin 80 MG tablet  Commonly known as:  LIPITOR  Take 1 tablet (80 mg total) by mouth daily at 6 PM.     carvedilol 12.5 MG tablet  Commonly known as:  COREG  Take 1 tablet (12.5 mg total) by mouth 2 (two) times daily with a meal.     isosorbide-hydrALAZINE 20-37.5 MG tablet  Commonly known as:  BIDIL  Take 0.5 tablets by mouth 3 (three) times daily.     losartan 25 MG tablet  Commonly known as:  COZAAR  Take 1 tablet (25 mg total) by mouth daily.     multivitamin tablet  Take 1 tablet by mouth daily.       Allergies  Allergen Reactions  . Demerol [Meperidine]     intolerance       The results of significant diagnostics from this hospitalization (including imaging, microbiology, ancillary and laboratory) are listed below for reference.    Significant Diagnostic Studies: Dg Chest 2 View  08/31/2015  CLINICAL DATA:  Dyspnea. EXAM: CHEST  2 VIEW COMPARISON:  August 31, 2015. FINDINGS: Stable cardiomediastinal silhouette. No pneumothorax is noted. Stable right basilar opacity is noted concerning for pneumonia or atelectasis with associated pleural effusion. Mild left basilar subsegmental atelectasis is noted. Bony thorax is unremarkable. IMPRESSION: Stable right basilar opacity is noted concerning for pneumonia or atelectasis with associated pleural effusion. Mild left basilar subsegmental atelectasis is noted as well. Electronically Signed   By: Lupita Raider, M.D.   On: 08/31/2015 11:00   Ct Head Wo Contrast  08/27/2015  CLINICAL DATA:  Initial evaluation for acute unresponsiveness. , left-sided weakness, now resolved. EXAM: CT HEAD WITHOUT CONTRAST TECHNIQUE: Contiguous axial images were obtained from the base of the skull through the vertex without intravenous contrast. COMPARISON:  None. FINDINGS:  Age-related cerebral volume loss present. Patchy and confluent hypodensity within the periventricular and deep white matter both cerebral hemispheres most consistent with chronic small vessel ischemic disease. Scattered vascular calcifications within the carotid siphons and distal right vertebral artery. No acute large vessel territory infarct. Gray-white matter differentiation grossly maintained. Deep gray nuclei are symmetric. No acute intracranial hemorrhage. No mass lesion, midline shift, or mass effect. No hydrocephalus. No extra-axial fluid collection. Scalp soft tissues within normal limits. No acute abnormality about the orbits. Paranasal sinuses mastoid air cells are clear. Calvarium intact. IMPRESSION: 1. No acute intracranial process. 2. Age-related cerebral atrophy  with chronic small vessel ischemic disease. Electronically Signed   By: Rise Mu M.D.   On: 08/27/2015 02:31   [name] Wo Contrast  08/28/2015  CLINICAL DATA:  Initial evaluation for transient left-sided weakness following an episode of unresponsiveness and hypotension. EXAM: MRI HEAD WITHOUT CONTRAST MRA HEAD WITHOUT CONTRAST TECHNIQUE: Multiplanar, multiecho pulse sequences of the brain and surrounding structures were obtained without intravenous contrast. Angiographic images of the head were obtained using MRA technique without contrast. COMPARISON:  Prior CT from 08/27/2015. FINDINGS: MRI HEAD FINDINGS Diffuse prominence of the CSF containing spaces is compatible with generalized age-related cerebral atrophy. Patchy and confluent T2/FLAIR hyperintensity within the periventricular and deep white matter both cerebral hemispheres most consistent with chronic small vessel ischemic disease. Small remote lacunar infarct within the periventricular white matter of the left corona radiata. There is a few small foci of patchy restricted diffusion within the high left frontal lobe and right frontoparietal regions bilaterally (series 4, image 33, 35). Additional tiny punctate cortical infarct more inferiorly within the right occipital lobe (series 4, image 26). Associated signal loss seen on corresponding ADC map. No associated hemorrhage or mass effect. Bodies consistent with small acute ischemic infarcts. Given the distribution of these findings, underlying watershed etiology is favored. Normal intravascular flow voids are maintained. Additional scattered subcentimeter foci of susceptibility artifact seen in within the peripheral cortices of the bilateral cerebral hemispheres on gradient echo sequence, consistent with small chronic micro hemorrhages. While these could potentially be related underlying hypertension, possible amyloid angiopathy could be considered given their somewhat peripheral  distribution. No mass lesion, midline shift, or mass effect. No hydrocephalus. No extra-axial fluid collection. Craniocervical junction within normal limits. Mild degenerative spondylolysis noted within the visualized upper cervical spine. Pituitary gland normal. Thinning of the anterior body of the corpus callosum noted, which may related to remote ischemia. No acute abnormality about the orbits. Mild right-sided exophthalmos. Paranasal sinuses are clear. No mastoid effusion. Inner ear structures within normal limits. Bone marrow signal intensity within normal limits. No scalp soft tissue abnormality. MRA HEAD FINDINGS ANTERIOR CIRCULATION: Visualized distal cervical segments of the internal carotid arteries are patent with antegrade flow. Petrous segments widely patent. Scattered multi focal atheromatous irregularity within the cavernous and supraclinoid left ICA. There is a short-segment moderate stenosis at the proximal supraclinoid left ICA (series 5, image 72). Cavernous and supraclinoid right ICA widely patent. Right A1 segment widely patent. Left A1 segment slightly hypoplastic. Anterior communicating artery patent. Atheromatous irregularity within the anterior cerebral arteries bilaterally. M1 segments widely patent without stenosis or occlusion. Atheromatous irregularity within the left M1 segment. MCA bifurcations within normal limits. MCA branches symmetric bilaterally. Distal small vessel disease present within the MCA branches bilaterally. POSTERIOR CIRCULATION: Vertebral arteries patent to the vertebrobasilar junction. Right vertebral artery slightly diminutive. Posterior inferior cerebral arteries not well evaluated on this exam. Basilar artery widely  patent. Small left anterior inferior cerebral artery noted. Superior cerebellar arteries patent. Both sub posterior cerebral arteries arise from the basilar artery and are well opacified to their distal aspects. Distal small vessel disease within the  PCA branches bilaterally. There is a more focal short-segment moderate stenosis within the proximal right P2 segment (series 505, image 15). No aneurysm or vascular malformation. IMPRESSION: MRI HEAD IMPRESSION: 1. Small volume patchy cortical and subcortical ischemic infarcts within the left frontal and right frontoparietal region, with additional tiny cortical infarct within the right occipital lobe. Given the distribution of these infarcts, an underlying watershed etiology is favored. No associated hemorrhage or mass effect. 2. Scattered small chronic micro hemorrhages as above. While these might be related to chronic underlying hypertension, possible amyloid angiopathy could also be considered given their somewhat peripheral distribution. 3. Atrophy with chronic microvascular ischemic disease. MRA HEAD IMPRESSION: 1. No large vessel or proximal arterial branch occlusion identified. 2. Short-segment moderate stenosis within the supraclinoid left ICA. 3. Short-segment moderate stenosis within the proximal right P2 segment. 4. Distal small vessel atheromatous disease within the MCA, ACA, and PCA branches bilaterally Electronically Signed   By: Rise Mu M.D.   On: 08/28/2015 02:26   Mr Brain Wo Contrast  08/28/2015  CLINICAL DATA:  Initial evaluation for transient left-sided weakness following an episode of unresponsiveness and hypotension. EXAM: MRI HEAD WITHOUT CONTRAST MRA HEAD WITHOUT CONTRAST TECHNIQUE: Multiplanar, multiecho pulse sequences of the brain and surrounding structures were obtained without intravenous contrast. Angiographic images of the head were obtained using MRA technique without contrast. COMPARISON:  Prior CT from 08/27/2015. FINDINGS: MRI HEAD FINDINGS Diffuse prominence of the CSF containing spaces is compatible with generalized age-related cerebral atrophy. Patchy and confluent T2/FLAIR hyperintensity within the periventricular and deep white matter both cerebral  hemispheres most consistent with chronic small vessel ischemic disease. Small remote lacunar infarct within the periventricular white matter of the left corona radiata. There is a few small foci of patchy restricted diffusion within the high left frontal lobe and right frontoparietal regions bilaterally (series 4, image 33, 35). Additional tiny punctate cortical infarct more inferiorly within the right occipital lobe (series 4, image 26). Associated signal loss seen on corresponding ADC map. No associated hemorrhage or mass effect. Bodies consistent with small acute ischemic infarcts. Given the distribution of these findings, underlying watershed etiology is favored. Normal intravascular flow voids are maintained. Additional scattered subcentimeter foci of susceptibility artifact seen in within the peripheral cortices of the bilateral cerebral hemispheres on gradient echo sequence, consistent with small chronic micro hemorrhages. While these could potentially be related underlying hypertension, possible amyloid angiopathy could be considered given their somewhat peripheral distribution. No mass lesion, midline shift, or mass effect. No hydrocephalus. No extra-axial fluid collection. Craniocervical junction within normal limits. Mild degenerative spondylolysis noted within the visualized upper cervical spine. Pituitary gland normal. Thinning of the anterior body of the corpus callosum noted, which may related to remote ischemia. No acute abnormality about the orbits. Mild right-sided exophthalmos. Paranasal sinuses are clear. No mastoid effusion. Inner ear structures within normal limits. Bone marrow signal intensity within normal limits. No scalp soft tissue abnormality. MRA HEAD FINDINGS ANTERIOR CIRCULATION: Visualized distal cervical segments of the internal carotid arteries are patent with antegrade flow. Petrous segments widely patent. Scattered multi focal atheromatous irregularity within the cavernous and  supraclinoid left ICA. There is a short-segment moderate stenosis at the proximal supraclinoid left ICA (series 5, image 72). Cavernous and supraclinoid right ICA widely patent.  Right A1 segment widely patent. Left A1 segment slightly hypoplastic. Anterior communicating artery patent. Atheromatous irregularity within the anterior cerebral arteries bilaterally. M1 segments widely patent without stenosis or occlusion. Atheromatous irregularity within the left M1 segment. MCA bifurcations within normal limits. MCA branches symmetric bilaterally. Distal small vessel disease present within the MCA branches bilaterally. POSTERIOR CIRCULATION: Vertebral arteries patent to the vertebrobasilar junction. Right vertebral artery slightly diminutive. Posterior inferior cerebral arteries not well evaluated on this exam. Basilar artery widely patent. Small left anterior inferior cerebral artery noted. Superior cerebellar arteries patent. Both sub posterior cerebral arteries arise from the basilar artery and are well opacified to their distal aspects. Distal small vessel disease within the PCA branches bilaterally. There is a more focal short-segment moderate stenosis within the proximal right P2 segment (series 505, image 15). No aneurysm or vascular malformation. IMPRESSION: MRI HEAD IMPRESSION: 1. Small volume patchy cortical and subcortical ischemic infarcts within the left frontal and right frontoparietal region, with additional tiny cortical infarct within the right occipital lobe. Given the distribution of these infarcts, an underlying watershed etiology is favored. No associated hemorrhage or mass effect. 2. Scattered small chronic micro hemorrhages as above. While these might be related to chronic underlying hypertension, possible amyloid angiopathy could also be considered given their somewhat peripheral distribution. 3. Atrophy with chronic microvascular ischemic disease. MRA HEAD IMPRESSION: 1. No large vessel or  proximal arterial branch occlusion identified. 2. Short-segment moderate stenosis within the supraclinoid left ICA. 3. Short-segment moderate stenosis within the proximal right P2 segment. 4. Distal small vessel atheromatous disease within the MCA, ACA, and PCA branches bilaterally Electronically Signed   By: Rise Mu M.D.   On: 08/28/2015 02:26   Dg Chest Right Decubitus  08/31/2015  CLINICAL DATA:  Dyspnea. EXAM: CHEST - RIGHT DECUBITUS COMPARISON:  Same day. FINDINGS: Right lateral decubitus view of the chest demonstrates mild free flowing right pleural effusion. IMPRESSION: Mild free flowing right pleural effusion. Electronically Signed   By: Lupita Raider, M.D.   On: 08/31/2015 11:27   Dg Chest Port 1 View  08/31/2015  CLINICAL DATA:  Chest pain beginning after physical therapy yesterday. EXAM: PORTABLE CHEST 1 VIEW COMPARISON:  08/28/2015 FINDINGS: Borderline heart size without vascular congestion. Infiltration in the right lung base obscuring the right hemidiaphragm. Probable small right pleural effusion. Appearance suggest pneumonia. Atelectasis in the left lung base is improved since previous study. No pneumothorax. Calcified and tortuous aorta. Mild thoracic scoliosis convex towards the right. IMPRESSION: Infiltration in the right lung base with probable small right pleural effusion. Findings suggest pneumonia. Electronically Signed   By: Burman Nieves M.D.   On: 08/31/2015 03:21   Dg Chest Port 1 View  08/28/2015  CLINICAL DATA:  Shortness of breath.  Pulmonary edema. EXAM: PORTABLE CHEST 1 VIEW COMPARISON:  08/26/2015. FINDINGS: Left IJ line in stable position. Cardiomegaly with bilateral pulmonary alveolar infiltrates. Slight improvement. Persistent small pleural effusions. No pneumothorax. IMPRESSION: 1. Left IJ line in stable position. 2. Cardiomegaly with persistent bilateral pulmonary infiltrates, partial clearing from prior exam. Persistent small pleural effusions.  Electronically Signed   By: Maisie Fus  Register   On: 08/28/2015 07:18   Dg Chest Port 1 View  08/26/2015  CLINICAL DATA:  80 year old female central line placement. Initial encounter. EXAM: PORTABLE CHEST 1 VIEW COMPARISON:  1306 hours today. FINDINGS: Portable AP semi upright view at 2259 hours. Left IJ central line has been placed. Tip is at the lower SVC level. No pneumothorax. Stable cardiac  size and mediastinal contours. Interval mild regression of widespread patchy in perihilar opacity, improvement is greater in the upper lobes. Residual in the lower lobes. No large pleural effusion. IMPRESSION: 1. Left IJ central line placed, tip at the lower SVC level with no adverse features. 2. Some interval regression of pulmonary edema, residual in the lower lungs. Electronically Signed   By: Odessa Fleming M.D.   On: 08/26/2015 23:06   Dg Chest Portable 1 View  08/26/2015  CLINICAL DATA:  Shortness of breath. EXAM: PORTABLE CHEST 1 VIEW COMPARISON:  None. FINDINGS: Borderline cardiomegaly is noted. No pneumothorax or significant pleural effusion is noted. Diffuse patchy airspace opacities are noted throughout both lungs concerning for pneumonia or edema. Bony thorax is unremarkable. IMPRESSION: Diffuse patchy bilateral airspace opacities are noted concerning for pneumonia or edema. Electronically Signed   By: Lupita Raider, M.D.   On: 08/26/2015 13:24    Microbiology: Recent Results (from the past 240 hour(s))  Respiratory virus panel     Status: None   Collection Time: 08/27/15 12:38 PM  Result Value Ref Range Status   Source - [facility] NASAL SWAB  Corrected   Respiratory Syncytial Virus A Negative Negative Final   Respiratory Syncytial Virus B Negative Negative Final   Influenza A Negative Negative Final   Influenza B Negative Negative Final   Parainfluenza 1 Negative Negative Final   Parainfluenza 2 Negative Negative Final   Parainfluenza 3 Negative Negative Final   Metapneumovirus Negative Negative Final    Rhinovirus Negative Negative Final   Adenovirus Negative Negative Final    Comment: (NOTE) Performed At: [facility] [address] [place], [place] [id number] Mila Homer MD VF:[id number]      Labs: Basic Metabolic Panel:  Recent Labs Lab 08/31/15 0319  09/02/15 0402 09/03/15 0320 09/04/15 0310 09/05/15 0312 09/05/15 1435 09/06/15 0442  NA 140  < > 137 136 142 140  --  140  K 3.6  < > 3.8 3.7 3.4* 3.9  --  4.1  CL 105  < > 102 102 105 105  --  107  CO2 26  < > 26 27 29 27   --  24  GLUCOSE 154*  < > 111* 108* 89 95  --  87  BUN 15  < > 15 12 11 12   --  13  CREATININE 1.19*  < > 1.05* 1.03* 1.14* 1.13* 1.10* 1.12*  CALCIUM 9.4  < > 8.9 8.9 9.3 9.6  --  9.5  MG 2.1  --   --   --   --   --   --   --   < > = values in this interval not displayed. Liver Function Tests:  Recent Labs Lab 08/31/15 0319  AST 33  ALT 24  ALKPHOS 65  BILITOT 0.7  PROT 6.6  ALBUMIN 3.0*   No results for input(s): LIPASE, AMYLASE in the last 168 hours. No results for input(s): AMMONIA in the last 168 hours. CBC:  Recent Labs Lab 08/31/15 0319 09/02/15 0402 09/05/15 0312 09/05/15 1435 09/06/15 0442  WBC 8.7 7.9 7.8 7.2 10.2  NEUTROABS 7.2  --   --   --   --   HGB 10.8* 9.6* 9.2* 9.6* 10.5*  HCT 32.3* 30.0* 29.5* 29.8* 31.4*  MCV 70.7* 71.8* 72.8* 73.0* 72.0*  PLT 266 246 310 230 274   Cardiac Enzymes:  Recent Labs Lab 08/30/15 2215 08/31/15 0319 08/31/15 1015 08/31/15 1509  TROPONINI 0.05* 0.03 <0.03 0.03  BNP: BNP (last 3 results)  Recent Labs  08/26/15 1307  BNP 451.8*    ProBNP (last 3 results) No results for input(s): PROBNP in the last 8760 hours.  CBG: No results for input(s): GLUCAP in the last 168 hours.     SignedEdsel Petrin  [facility] 09/06/2015, 10:26 AM

## 2015-09-06 NOTE — Progress Notes (Signed)
Rehab admissions - I spoke with patient.  She says she has been getting up going to the bathroom, doing some ADLs in her room.  She feels she may be able to discharge directly home.  I have talked with Dr. Catha Gosselin and she will speak with patient.  I will hold on re admit to acute inpatient rehab until MD speaks with patient.  Call me for questions.  #[phone]

## 2015-09-06 NOTE — Progress Notes (Signed)
Patient ID: Julie Stein, female   DOB: 1935-09-11, 80 y.o.   MRN: [id number]   SUBJECTIVE:  No further SVT.  EP study yesterday was unable to trigger the arrhythmia.  BP fluctuates considerably.  No lightheadedness, feeling good today.  Getting stronger, walking in room with walker.   Denies SOB/Orthopnea.    Scheduled Meds: . aspirin EC  81 mg Oral Daily  . atorvastatin  80 mg Oral q1800  . carvedilol  12.5 mg Oral BID WC  . heparin subcutaneous  5,000 Units Subcutaneous 3 times per day  . isosorbide-hydrALAZINE  0.5 tablet Oral TID  . losartan  25 mg Oral Daily  . sodium chloride  3 mL Intravenous Q12H  . sodium chloride  3 mL Intravenous Q12H  . spironolactone  25 mg Oral Daily   Continuous Infusions:   PRN Meds:.sodium chloride, [DISCONTINUED] acetaminophen **OR** acetaminophen, acetaminophen, nitroGLYCERIN, ondansetron (ZOFRAN) IV, ondansetron **OR** [DISCONTINUED] ondansetron (ZOFRAN) IV, sodium chloride    Filed Vitals:   09/05/15 1715 09/05/15 1815 09/05/15 2023 09/06/15 0505  BP: 109/59 125/75 136/64 155/72  Pulse: 85 87 84 87  Temp:   98.2 F (36.8 C) 97.9 F (36.6 C)  TempSrc:   Oral Oral  Resp:  Height:      Weight:    106 lb 3.2 oz (48.172 kg)  SpO2: 96% 96% 98% 98%    Intake/Output Summary (Last 24 hours) at 09/06/15 0826 Last data filed at 09/06/15 0600  Gross per 24 hour  Intake 1413.87 ml  Output   1700 ml  Net -286.13 ml    LABS: Basic Metabolic Panel:  Recent Labs  40/98/11 0312 09/05/15 1435 09/06/15 0442  NA 140  --  140  K 3.9  --  4.1  CL 105  --  107  CO2 27  --  24  GLUCOSE 95  --  87  BUN 12  --  13  CREATININE 1.13* 1.10* 1.12*  CALCIUM 9.6  --  9.5   Liver Function Tests: No results for input(s): AST, ALT, ALKPHOS, BILITOT, PROT, ALBUMIN in the last 72 hours. No results for input(s): LIPASE, AMYLASE in the last 72 hours. CBC:  Recent Labs  09/05/15 1435 09/06/15 0442  WBC 7.2 10.2  HGB 9.6* 10.5*  HCT 29.8*  31.4*  MCV 73.0* 72.0*  PLT 230 274   Cardiac Enzymes: No results for input(s): CKTOTAL, CKMB, CKMBINDEX, TROPONINI in the last 72 hours. BNP: Invalid input(s): POCBNP D-Dimer: No results for input(s): DDIMER in the last 72 hours. Hemoglobin A1C: No results for input(s): HGBA1C in the last 72 hours. Fasting Lipid Panel: No results for input(s): CHOL, HDL, LDLCALC, TRIG, CHOLHDL, LDLDIRECT in the last 72 hours. Thyroid Function Tests: No results for input(s): TSH, T4TOTAL, T3FREE, THYROIDAB in the last 72 hours.  Invalid input(s): FREET3 Anemia Panel: No results for input(s): VITAMINB12, FOLATE, FERRITIN, TIBC, IRON, RETICCTPCT in the last 72 hours.  RADIOLOGY: Dg Chest 2 View  08/31/2015  CLINICAL DATA:  Dyspnea. EXAM: CHEST  2 VIEW COMPARISON:  August 31, 2015. FINDINGS: Stable cardiomediastinal silhouette. No pneumothorax is noted. Stable right basilar opacity is noted concerning for pneumonia or atelectasis with associated pleural effusion. Mild left basilar subsegmental atelectasis is noted. Bony thorax is unremarkable. IMPRESSION: Stable right basilar opacity is noted concerning for pneumonia or atelectasis with associated pleural effusion. Mild left basilar subsegmental atelectasis is noted as well. Electronically Signed   By: Lupita Raider, M.D.   On:  08/31/2015 11:00   Ct Head Wo Contrast  08/27/2015  CLINICAL DATA:  Initial evaluation for acute unresponsiveness. , left-sided weakness, now resolved. EXAM: CT HEAD WITHOUT CONTRAST TECHNIQUE: Contiguous axial images were obtained from the base of the skull through the vertex without intravenous contrast. COMPARISON:  None. FINDINGS: Age-related cerebral volume loss present. Patchy and confluent hypodensity within the periventricular and deep white matter both cerebral hemispheres most consistent with chronic small vessel ischemic disease. Scattered vascular calcifications within the carotid siphons and distal right vertebral  artery. No acute large vessel territory infarct. Gray-white matter differentiation grossly maintained. Deep gray nuclei are symmetric. No acute intracranial hemorrhage. No mass lesion, midline shift, or mass effect. No hydrocephalus. No extra-axial fluid collection. Scalp soft tissues within normal limits. No acute abnormality about the orbits. Paranasal sinuses mastoid air cells are clear. Calvarium intact. IMPRESSION: 1. No acute intracranial process. 2. Age-related cerebral atrophy with chronic small vessel ischemic disease. Electronically Signed   By: Rise Mu M.D.   On: 08/27/2015 02:31   [name] Wo Contrast  08/28/2015  CLINICAL DATA:  Initial evaluation for transient left-sided weakness following an episode of unresponsiveness and hypotension. EXAM: MRI HEAD WITHOUT CONTRAST MRA HEAD WITHOUT CONTRAST TECHNIQUE: Multiplanar, multiecho pulse sequences of the brain and surrounding structures were obtained without intravenous contrast. Angiographic images of the head were obtained using MRA technique without contrast. COMPARISON:  Prior CT from 08/27/2015. FINDINGS: MRI HEAD FINDINGS Diffuse prominence of the CSF containing spaces is compatible with generalized age-related cerebral atrophy. Patchy and confluent T2/FLAIR hyperintensity within the periventricular and deep white matter both cerebral hemispheres most consistent with chronic small vessel ischemic disease. Small remote lacunar infarct within the periventricular white matter of the left corona radiata. There is a few small foci of patchy restricted diffusion within the high left frontal lobe and right frontoparietal regions bilaterally (series 4, image 33, 35). Additional tiny punctate cortical infarct more inferiorly within the right occipital lobe (series 4, image 26). Associated signal loss seen on corresponding ADC map. No associated hemorrhage or mass effect. Bodies consistent with small acute ischemic infarcts. Given the  distribution of these findings, underlying watershed etiology is favored. Normal intravascular flow voids are maintained. Additional scattered subcentimeter foci of susceptibility artifact seen in within the peripheral cortices of the bilateral cerebral hemispheres on gradient echo sequence, consistent with small chronic micro hemorrhages. While these could potentially be related underlying hypertension, possible amyloid angiopathy could be considered given their somewhat peripheral distribution. No mass lesion, midline shift, or mass effect. No hydrocephalus. No extra-axial fluid collection. Craniocervical junction within normal limits. Mild degenerative spondylolysis noted within the visualized upper cervical spine. Pituitary gland normal. Thinning of the anterior body of the corpus callosum noted, which may related to remote ischemia. No acute abnormality about the orbits. Mild right-sided exophthalmos. Paranasal sinuses are clear. No mastoid effusion. Inner ear structures within normal limits. Bone marrow signal intensity within normal limits. No scalp soft tissue abnormality. MRA HEAD FINDINGS ANTERIOR CIRCULATION: Visualized distal cervical segments of the internal carotid arteries are patent with antegrade flow. Petrous segments widely patent. Scattered multi focal atheromatous irregularity within the cavernous and supraclinoid left ICA. There is a short-segment moderate stenosis at the proximal supraclinoid left ICA (series 5, image 72). Cavernous and supraclinoid right ICA widely patent. Right A1 segment widely patent. Left A1 segment slightly hypoplastic. Anterior communicating artery patent. Atheromatous irregularity within the anterior cerebral arteries bilaterally. M1 segments widely patent without stenosis or occlusion. Atheromatous irregularity  within the left M1 segment. MCA bifurcations within normal limits. MCA branches symmetric bilaterally. Distal small vessel disease present within the MCA  branches bilaterally. POSTERIOR CIRCULATION: Vertebral arteries patent to the vertebrobasilar junction. Right vertebral artery slightly diminutive. Posterior inferior cerebral arteries not well evaluated on this exam. Basilar artery widely patent. Small left anterior inferior cerebral artery noted. Superior cerebellar arteries patent. Both sub posterior cerebral arteries arise from the basilar artery and are well opacified to their distal aspects. Distal small vessel disease within the PCA branches bilaterally. There is a more focal short-segment moderate stenosis within the proximal right P2 segment (series 505, image 15). No aneurysm or vascular malformation. IMPRESSION: MRI HEAD IMPRESSION: 1. Small volume patchy cortical and subcortical ischemic infarcts within the left frontal and right frontoparietal region, with additional tiny cortical infarct within the right occipital lobe. Given the distribution of these infarcts, an underlying watershed etiology is favored. No associated hemorrhage or mass effect. 2. Scattered small chronic micro hemorrhages as above. While these might be related to chronic underlying hypertension, possible amyloid angiopathy could also be considered given their somewhat peripheral distribution. 3. Atrophy with chronic microvascular ischemic disease. MRA HEAD IMPRESSION: 1. No large vessel or proximal arterial branch occlusion identified. 2. Short-segment moderate stenosis within the supraclinoid left ICA. 3. Short-segment moderate stenosis within the proximal right P2 segment. 4. Distal small vessel atheromatous disease within the MCA, ACA, and PCA branches bilaterally Electronically Signed   By: Rise Mu M.D.   On: 08/28/2015 02:26   Mr Brain Wo Contrast  08/28/2015  CLINICAL DATA:  Initial evaluation for transient left-sided weakness following an episode of unresponsiveness and hypotension. EXAM: MRI HEAD WITHOUT CONTRAST MRA HEAD WITHOUT CONTRAST TECHNIQUE:  Multiplanar, multiecho pulse sequences of the brain and surrounding structures were obtained without intravenous contrast. Angiographic images of the head were obtained using MRA technique without contrast. COMPARISON:  Prior CT from 08/27/2015. FINDINGS: MRI HEAD FINDINGS Diffuse prominence of the CSF containing spaces is compatible with generalized age-related cerebral atrophy. Patchy and confluent T2/FLAIR hyperintensity within the periventricular and deep white matter both cerebral hemispheres most consistent with chronic small vessel ischemic disease. Small remote lacunar infarct within the periventricular white matter of the left corona radiata. There is a few small foci of patchy restricted diffusion within the high left frontal lobe and right frontoparietal regions bilaterally (series 4, image 33, 35). Additional tiny punctate cortical infarct more inferiorly within the right occipital lobe (series 4, image 26). Associated signal loss seen on corresponding ADC map. No associated hemorrhage or mass effect. Bodies consistent with small acute ischemic infarcts. Given the distribution of these findings, underlying watershed etiology is favored. Normal intravascular flow voids are maintained. Additional scattered subcentimeter foci of susceptibility artifact seen in within the peripheral cortices of the bilateral cerebral hemispheres on gradient echo sequence, consistent with small chronic micro hemorrhages. While these could potentially be related underlying hypertension, possible amyloid angiopathy could be considered given their somewhat peripheral distribution. No mass lesion, midline shift, or mass effect. No hydrocephalus. No extra-axial fluid collection. Craniocervical junction within normal limits. Mild degenerative spondylolysis noted within the visualized upper cervical spine. Pituitary gland normal. Thinning of the anterior body of the corpus callosum noted, which may related to remote ischemia. No  acute abnormality about the orbits. Mild right-sided exophthalmos. Paranasal sinuses are clear. No mastoid effusion. Inner ear structures within normal limits. Bone marrow signal intensity within normal limits. No scalp soft tissue abnormality. MRA HEAD FINDINGS ANTERIOR CIRCULATION: Visualized distal  cervical segments of the internal carotid arteries are patent with antegrade flow. Petrous segments widely patent. Scattered multi focal atheromatous irregularity within the cavernous and supraclinoid left ICA. There is a short-segment moderate stenosis at the proximal supraclinoid left ICA (series 5, image 72). Cavernous and supraclinoid right ICA widely patent. Right A1 segment widely patent. Left A1 segment slightly hypoplastic. Anterior communicating artery patent. Atheromatous irregularity within the anterior cerebral arteries bilaterally. M1 segments widely patent without stenosis or occlusion. Atheromatous irregularity within the left M1 segment. MCA bifurcations within normal limits. MCA branches symmetric bilaterally. Distal small vessel disease present within the MCA branches bilaterally. POSTERIOR CIRCULATION: Vertebral arteries patent to the vertebrobasilar junction. Right vertebral artery slightly diminutive. Posterior inferior cerebral arteries not well evaluated on this exam. Basilar artery widely patent. Small left anterior inferior cerebral artery noted. Superior cerebellar arteries patent. Both sub posterior cerebral arteries arise from the basilar artery and are well opacified to their distal aspects. Distal small vessel disease within the PCA branches bilaterally. There is a more focal short-segment moderate stenosis within the proximal right P2 segment (series 505, image 15). No aneurysm or vascular malformation. IMPRESSION: MRI HEAD IMPRESSION: 1. Small volume patchy cortical and subcortical ischemic infarcts within the left frontal and right frontoparietal region, with additional tiny cortical  infarct within the right occipital lobe. Given the distribution of these infarcts, an underlying watershed etiology is favored. No associated hemorrhage or mass effect. 2. Scattered small chronic micro hemorrhages as above. While these might be related to chronic underlying hypertension, possible amyloid angiopathy could also be considered given their somewhat peripheral distribution. 3. Atrophy with chronic microvascular ischemic disease. MRA HEAD IMPRESSION: 1. No large vessel or proximal arterial branch occlusion identified. 2. Short-segment moderate stenosis within the supraclinoid left ICA. 3. Short-segment moderate stenosis within the proximal right P2 segment. 4. Distal small vessel atheromatous disease within the MCA, ACA, and PCA branches bilaterally Electronically Signed   By: Rise Mu M.D.   On: 08/28/2015 02:26   Dg Chest Right Decubitus  08/31/2015  CLINICAL DATA:  Dyspnea. EXAM: CHEST - RIGHT DECUBITUS COMPARISON:  Same day. FINDINGS: Right lateral decubitus view of the chest demonstrates mild free flowing right pleural effusion. IMPRESSION: Mild free flowing right pleural effusion. Electronically Signed   By: Lupita Raider, M.D.   On: 08/31/2015 11:27   Dg Chest Port 1 View  08/31/2015  CLINICAL DATA:  Chest pain beginning after physical therapy yesterday. EXAM: PORTABLE CHEST 1 VIEW COMPARISON:  08/28/2015 FINDINGS: Borderline heart size without vascular congestion. Infiltration in the right lung base obscuring the right hemidiaphragm. Probable small right pleural effusion. Appearance suggest pneumonia. Atelectasis in the left lung base is improved since previous study. No pneumothorax. Calcified and tortuous aorta. Mild thoracic scoliosis convex towards the right. IMPRESSION: Infiltration in the right lung base with probable small right pleural effusion. Findings suggest pneumonia. Electronically Signed   By: Burman Nieves M.D.   On: 08/31/2015 03:21   Dg Chest Port 1  View  08/28/2015  CLINICAL DATA:  Shortness of breath.  Pulmonary edema. EXAM: PORTABLE CHEST 1 VIEW COMPARISON:  08/26/2015. FINDINGS: Left IJ line in stable position. Cardiomegaly with bilateral pulmonary alveolar infiltrates. Slight improvement. Persistent small pleural effusions. No pneumothorax. IMPRESSION: 1. Left IJ line in stable position. 2. Cardiomegaly with persistent bilateral pulmonary infiltrates, partial clearing from prior exam. Persistent small pleural effusions. Electronically Signed   By: Maisie Fus  Register   On: 08/28/2015 07:18   Dg Chest Port 1  View  08/26/2015  CLINICAL DATA:  80 year old female central line placement. Initial encounter. EXAM: PORTABLE CHEST 1 VIEW COMPARISON:  1306 hours today. FINDINGS: Portable AP semi upright view at 2259 hours. Left IJ central line has been placed. Tip is at the lower SVC level. No pneumothorax. Stable cardiac size and mediastinal contours. Interval mild regression of widespread patchy in perihilar opacity, improvement is greater in the upper lobes. Residual in the lower lobes. No large pleural effusion. IMPRESSION: 1. Left IJ central line placed, tip at the lower SVC level with no adverse features. 2. Some interval regression of pulmonary edema, residual in the lower lungs. Electronically Signed   By: Odessa Fleming M.D.   On: 08/26/2015 23:06   Dg Chest Portable 1 View  08/26/2015  CLINICAL DATA:  Shortness of breath. EXAM: PORTABLE CHEST 1 VIEW COMPARISON:  None. FINDINGS: Borderline cardiomegaly is noted. No pneumothorax or significant pleural effusion is noted. Diffuse patchy airspace opacities are noted throughout both lungs concerning for pneumonia or edema. Bony thorax is unremarkable. IMPRESSION: Diffuse patchy bilateral airspace opacities are noted concerning for pneumonia or edema. Electronically Signed   By: Lupita Raider, M.D.   On: 08/26/2015 13:24    PHYSICAL EXAM General: NAD. In Bed.  Neck: JVP 7-8 cm, no thyromegaly or thyroid  nodule.  Lungs: Clear to auscultation bilaterally with normal respiratory effort. CV: Nondisplaced PMI.  Heart regular S1/S2 with S3 gallop, no murmur.  No peripheral edema.   Abdomen: Soft, nontender, no hepatosplenomegaly, no distention.  Neurologic: Alert and oriented x 3.  Psych: Normal affect. Extremities: No clubbing or cyanosis.   TELEMETRY: NSR   ASSESSMENT AND PLAN: Ms Marks was readmitted 10/14 after syncopal episode requiring brief CPR.  1. Syncope: Occurred while on bedside commode. Required brief CPR. No meds required. ? Vagal episode associated with micturation versus arrhythmia.  Also with soft BP, we cut back her cardiac meds.  Concerned now regarding recurrent episodes of SVT with rate around 180 which may have been culprit => unfortunately, she was not on telemetry when event occurred.  - She has not been orthostatic when measured. - We cut back on her cardiac meds, BP has been stable with this change.  - She had EP study yesterday => unable to trigger an arrhythmia.  Will continue Coreg, can use amiodarone if necessary but would hold off for now.  If BP remains stable, will increase Coreg (but let's keep the same for now as SBP in 100s at times).   - With Rapid SVT in setting of cardiomyopathy as possible culprit of syncope, think I would hold off on Lifevest.  2. Chronic Systolic HF: Primarily nonischemic cardiomyopathy. ECHO 10/9 EF 20-25% with LVH. Volume status ok. No diuretics.  - Continue Coreg 12.5 mg bid.  - Continue current doses of losartan, spironolactone, and Bidil.      3. CAD: LHC 10/10 with 20% proximal LAD, 40% mid LAD, 90% distal, large high OM1 with 80% ostial stenosis. Medically managed. Troponin normal this admission.  - Contine ASA and statin.  4. CVA: MRI head last admission showed small infarcts in watershed region. She has left leg weakness that pre-dated her cardiac cath, ?related to uncontrolled HTN.  5. H/o SVT: Required amiodarone  briefly last admission. 10/16 back in SVT and briefly on diltiazem gtt with conversion to NSR.  No further SVT.  ?rapid SVT in setting of low EF as contributor to syncopal event (along with iatrogenic low BP). EP study yesterday,  unable to trigger arrhythmia.  Continue Coreg. 6. HTN: Stable today. Renal artery dopplers did not show RAS.  7. Pulmonary: Persistent right-sided infiltrate on CXR, decreased BS right base. Does not appear changed from prior. Normal WBCs, no fever. Had course of azithromycin. ?atelectasis with small effusion.   8. Needs ongoing PT/OT. CIR today.  9. DVT prophylaxis: Subcutaneous heparin.   Marca Ancona 09/06/2015 8:26 AM

## 2015-09-06 NOTE — Progress Notes (Signed)
Julie Mage, RN Rehab Admission Coordinator Signed Physical Medicine and Rehabilitation PMR Pre-admission 09/03/2015 11:18 AM  Related encounter: Admission (Current) from 08/31/2015 in [facility] 3E CHF    Expand All Collapse All   PMR Admission Coordinator Pre-Admission Assessment  Patient: Julie Stein is an 80 y.o., female MRN: [id number] DOB: 1935-07-12 Height: 4\' 11"  (149.9 cm) Weight: 48.172 kg (106 lb 3.2 oz) (scale b)  Insurance Information HMO: No PPO: PCP: IPA: 80/20: OTHER:  PRIMARY: Medicare part A only Policy#: [id number] Subscriber: Lyn Henri CM Name: Phone#: Fax#:  Pre-Cert#: Employer: Not employed Benefits: Phone #: Name: Checked in [place]. Date: 04/17/02 part A only Deduct: $1288 Out of Pocket Max: none Life Max: unlimited CIR: 100% SNF: 100 days Outpatient: Co-Pay:  Home Health: 100% Co-Pay: none DME: Co-Pay:  Providers: patient's choice  Medicaid Application Date: Case Manager:  Disability Application Date: Case Worker:   Emergency Contact Information Contact Information    Name Relation Home Work Mobile   Dejoseph,James Spouse [phone]  [phone]   Pallo,Wanda Daughter   [phone]   Puello,Rasheedah Daughter [phone]  [phone]     Current Medical History  Patient Admitting Diagnosis: B infarcts watershed pattern  History of Present Illness: A 80 y.o. right handed female history of hypertension. Patient lives with grandson and his wife large extended supportive family. Independent prior to admission and active. One level home 6 steps to entry. Presented 08/26/2015 with sudden onset of shortness of breath noted recent bouts of diarrhea,  congestion and cough. Chest x-ray shows significant pulmonary edema. EKG with frequent PVCs bigeminy 2 runs of SVT heart rate 200 bpm. She became hypotensive placed on amiodarone drip. Bedside echocardiogram with ejection fraction 20-25% diffuse hypokinesis, RV functioning well. Placed on broad-spectrum antibiotic suspect viral illness. Troponin mildly elevated 0.22 days 0.29. Cardiology service is consulted and underwent cardiac catheterization showing severe distal LAD stenosis moderate ostial large OMI stenosis. Advised medical management. Post catheterization noted left-sided weakness. MRI of the brain showed small volume patchy cortical and subcortical ischemic infarcts within the left frontal and right frontoparietal region with additional tiny cortical infarcts within the right occipital lobe. MRA of the head with no major vessel occlusion or stenosis. Placed on aspirin for CVA prophylaxis. Subcutaneous heparin for DVT prophylaxis. Physical therapy evaluation completed 08/29/2015 with recommendations of physical medicine rehabilitation consult. Patient was admitted for comprehensive rehabilitation program 08/29/2015. On 08/31/2015 developed some nonspecific chest pressure lasting perhaps an hour and resolve spontaneously. Later she was up to the bathroom urinating had a syncopal event noted asystole lasted approximately less than a minute. CODE BLUE was called was given at least 2-3 minutes at bedtime chest compressions and regained pulse became more alert and awake. She did not recall the incident. She was discharged to acute care services for ongoing monitoring and workup. Troponin was 0.05. Cardiology follow-up. Chest x-ray showed questionable infiltrate right lower lobe probable right small pleural effusion similar to prior. EKG SVT with frequent PVCs. Question vagal episode associated with micturition versus arrhythmia versus orthostasis. Ongoing bouts of tachycardia SVT heart rate had been in the 180s  and had been maintained on intravenous Cardizem.Lottie Rater EP study 09/05/2015 unable to trigger an arrhythmia. Rate has been controlled and advised to continue Coreg for now. Therapies have been reinitiated with progressive gains. Patient to be admitted for comprehensive inpatient rehabilitation program.   Past Medical History  Past Medical History  Diagnosis Date  . Hypertension   . CHF (congestive heart failure) (  HCC)   . Stroke (HCC)   . SVT (supraventricular tachycardia) (HCC)   . CAD (coronary artery disease)   . Cardiomyopathy (HCC)     Family History  family history includes Hypertension in her daughter.  Prior Rehab/Hospitalizations: Admitted in CIR on 08/29/15 and transferred back to acute hospital on 08/31/15  Has the patient had major surgery during 100 days prior to admission? No  Current Medications   Current facility-administered medications:  . 0.9 % sodium chloride infusion, 250 mL, Intravenous, PRN, Will Jorja Loa, MD . [DISCONTINUED] acetaminophen (TYLENOL) tablet 650 mg, 650 mg, Oral, Q6H PRN, 650 mg at 09/04/15 2226 **OR** acetaminophen (TYLENOL) suppository 650 mg, 650 mg, Rectal, Q6H PRN, Eduard Clos, MD . acetaminophen (TYLENOL) tablet 650 mg, 650 mg, Oral, Q4H PRN, Will Jorja Loa, MD, 650 mg at 09/05/15 1314 . aspirin EC tablet 81 mg, 81 mg, Oral, Daily, Penny Pia, MD, 81 mg at 09/06/15 0935 . atorvastatin (LIPITOR) tablet 80 mg, 80 mg, Oral, q1800, Eduard Clos, MD, 80 mg at 09/05/15 1736 . carvedilol (COREG) tablet 12.5 mg, 12.5 mg, Oral, BID WC, Penny Pia, MD, 12.5 mg at 09/06/15 0802 . heparin injection 5,000 Units, 5,000 Units, Subcutaneous, 3 times per day, Will Jorja Loa, MD, 5,000 Units at 09/06/15 0529 . isosorbide-hydrALAZINE (BIDIL) 20-37.5 MG per tablet 0.5 tablet, 0.5 tablet, Oral, TID, Graciella Freer, PA-C, 0.5 tablet at 09/06/15 0935 . losartan (COZAAR) tablet 25 mg,  25 mg, Oral, Daily, Amy D Clegg, NP, 25 mg at 09/06/15 0935 . nitroGLYCERIN (NITROSTAT) SL tablet 0.4 mg, 0.4 mg, Sublingual, Q5 min PRN, Eduard Clos, MD . ondansetron [facility]) injection 4 mg, 4 mg, Intravenous, Q6H PRN, Will Jorja Loa, MD . ondansetron (ZOFRAN) tablet 4 mg, 4 mg, Oral, Q6H PRN **OR** [DISCONTINUED] ondansetron (ZOFRAN) injection 4 mg, 4 mg, Intravenous, Q6H PRN, Eduard Clos, MD . sodium chloride 0.9 % injection 3 mL, 3 mL, Intravenous, Q12H, Eduard Clos, MD, 3 mL at 09/06/15 1000 . sodium chloride 0.9 % injection 3 mL, 3 mL, Intravenous, Q12H, Will Jorja Loa, MD, 3 mL at 09/05/15 1115 . sodium chloride 0.9 % injection 3 mL, 3 mL, Intravenous, PRN, Will Jorja Loa, MD . spironolactone (ALDACTONE) tablet 25 mg, 25 mg, Oral, Daily, Laurey Morale, MD, 25 mg at 09/06/15 0935  Patients Current Diet: Diet - low sodium heart healthy Diet 2 gram sodium Room service appropriate?: Yes; Fluid consistency:: Thin  Precautions / Restrictions Precautions Precautions: Fall Restrictions Weight Bearing Restrictions: No   Has the patient had 2 or more falls or a fall with injury in the past year?No  Prior Activity Level Community (5-7x/wk): Goes out 3-4 X a week. Was driving  Journalist, newspaper / Equipment Home Assistive Devices/Equipment: None Home Equipment: None  Prior Device Use: Indicate devices/aids used by the patient prior to current illness, exacerbation or injury? None  Prior Functional Level Prior Function Level of Independence: Independent (prior to initial admission) Comments: enjoys gardening  Self Care: Did the patient need help bathing, dressing, using the toilet or eating? Independent  Indoor Mobility: Did the patient need assistance with walking from room to room (with or without device)? Independent  Stairs: Did the patient need assistance with internal or external stairs (with or without device)?  Independent  Functional Cognition: Did the patient need help planning regular tasks such as shopping or remembering to take medications? Independent. Patient says the only thing she needed help with was gardening.  Current Functional  Level Cognition  Arousal/Alertness: Awake/alert Overall Cognitive Status: Within Functional Limits for tasks assessed Orientation Level: Oriented X4   Extremity Assessment (includes Sensation/Coordination)  Upper Extremity Assessment: Generalized weakness  Lower Extremity Assessment: Generalized weakness    ADLs  Overall ADL's : Needs assistance/impaired Toilet Transfer: Minimal assistance, RW Toileting- Clothing Manipulation and Hygiene: Minimal assistance General ADL Comments: REquires general set up with UB ADL and min A with LB ADL for balance.     Mobility  Overal bed mobility: Needs Assistance Bed Mobility: Supine to Sit Supine to sit: Supervision General bed mobility comments: Cues to initiate; supervision for safety    Transfers  Overall transfer level: Needs assistance Equipment used: 1 person hand held assist Transfers: Sit to/from Stand Sit to Stand: Min assist, +2 safety/equipment General transfer comment: 2nd person for equipment. Pt with posterior sway.    Ambulation / Gait / Stairs / Wheelchair Mobility  Ambulation/Gait Ambulation/Gait assistance: +2 physical assistance Ambulation Distance (Feet): 5 Feet Assistive device: 2 person hand held assist Gait Pattern/deviations: Ataxic General Gait Details: Pt took a few steps forward and back but gait deferred due to posterior and L lateral lean as well as L LE buckling.    Posture / Balance Balance Overall balance assessment: Needs assistance Sitting-balance support: No upper extremity supported, Feet supported Sitting balance-Leahy Scale: Good Standing balance support: Bilateral upper extremity supported Standing balance-Leahy Scale: Poor Standing balance  comment: pt with posterior and L lateral lean    Special needs/care consideration BiPAP/CPAP No CPM No Continuous Drip IV No Dialysis No  Life Vest No Oxygen Yes, currently with 02 in acute hospital, but not at home Special Bed No Trach SizeNo Wound Vac (area) No  Skin No  Bowel mgmt: Last BM 09/02/15 Bladder mgmt: WDL Diabetic mgmt No    Previous Home Environment Living Arrangements: Spouse/significant other, Children Available Help at Discharge: Family, Available 24 hours/day (grandson and his wife in school during day, but other family members able to assist ) Type of Home: House Home Layout: One level Home Access: Stairs to enter Entrance Stairs-Rails: Right, Left, Can reach both Entrance Stairs-Number of Steps: 6 Bathroom Shower/Tub: Psychologist, counselling, Engineer, building services: Standard Bathroom Accessibility: Yes How Accessible: Accessible via walker Home Care Services: No Additional Comments: Pt was gardening, doing yard work, cleaned house - was fully independent PTA.   Discharge Living Setting Plans for Discharge Living Setting: Patient's home, Lives with (comment) (Grandson and his wife live with patient.) Type of Home at Discharge: House Discharge Home Layout: One level Discharge Home Access: Stairs to enter Entrance Stairs-Number of Steps: 3-4 steps at back and 7 steps at the front Does the patient have any problems obtaining your medications?: No  Social/Family/Support Systems Patient Roles: Parent, Adult nurse Information: See emergency contacts Anticipated Caregiver: Daughter's and grandchildren Ability/Limitations of Caregiver: Lucila Maine and his wife live with patient but attend school during the morning. Granddaughter-in-law in home in the afternoons. Caregiver Availability: 24/7 (Can make arrangements for 24/7 care.) Discharge Plan Discussed with Primary Caregiver: Yes Is Caregiver In Agreement with Plan?:  Yes Does Caregiver/Family have Issues with Lodging/Transportation while Pt is in Rehab?: No  Goals/Additional Needs Patient/Family Goal for Rehab: PT/OT supervision goals Expected length of stay: 7-10 days Cultural Considerations: Muslim, does not eat port. Dietary Needs: Heart diet, thin liquids Equipment Needs: TBD Pt/Family Agrees to Admission and willing to participate: Yes Program Orientation Provided & Reviewed with Pt/Caregiver Including Roles & Responsibilities: Yes  Decrease burden of Care through IP  rehab admission: N/A  Possible need for SNF placement upon discharge: Not anticipated  Patient Condition: This patient's medical and functional status has changed since the consult dated: 08/29/15 in which the Rehabilitation Physician determined and documented that the patient's condition is appropriate for intensive rehabilitative care in an inpatient rehabilitation facility. See "History of Present Illness" (above) for medical update. Functional changes are: Currently requiring min assist to ambulate 5 ft +2 HHA. Patient's medical and functional status update has been discussed with the Rehabilitation physician and patient remains appropriate for inpatient rehabilitation. Will admit to inpatient rehab today.  Preadmission Screen Completed By: Julie Stein, 09/06/2015 11:09 AM ______________________________________________________________________  Discussed status with Dr. Allena Katz on 09/06/15 at 1041 and received telephone approval for admission today.  Admission Coordinator: Julie Stein, time1041/Date10/20/16          Cosigned by: Ankit Karis Juba, MD at 09/06/2015 11:11 AM  Revision History     Date/Time User Provider Type Action   09/06/2015 11:11 AM Ankit Karis Juba, MD Physician Cosign   09/06/2015 11:09 AM Julie Mage, RN Rehab Admission Coordinator Sign   09/06/2015 10:41 AM Julie Mage, RN Rehab Admission Coordinator Sign   09/03/2015 3:34 PM  Julie Mage, RN Rehab Admission Coordinator Share   View Details Report

## 2015-09-06 NOTE — Discharge Instructions (Signed)

## 2015-09-07 ENCOUNTER — Inpatient Hospital Stay (HOSPITAL_COMMUNITY): Payer: Medicare Other | Admitting: Occupational Therapy

## 2015-09-07 ENCOUNTER — Inpatient Hospital Stay (HOSPITAL_COMMUNITY): Payer: Medicare Other | Admitting: Physical Therapy

## 2015-09-07 DIAGNOSIS — I471 Supraventricular tachycardia: Secondary | ICD-10-CM

## 2015-09-07 DIAGNOSIS — I5022 Chronic systolic (congestive) heart failure: Secondary | ICD-10-CM

## 2015-09-07 DIAGNOSIS — I69354 Hemiplegia and hemiparesis following cerebral infarction affecting left non-dominant side: Secondary | ICD-10-CM | POA: Diagnosis not present

## 2015-09-07 LAB — CBC WITH DIFFERENTIAL/PLATELET
BASOS PCT: 1 %
Basophils Absolute: 0.1 10*3/uL (ref 0.0–0.1)
Eosinophils Absolute: 0.3 10*3/uL (ref 0.0–0.7)
Eosinophils Relative: 3 %
HEMATOCRIT: 30.1 % — AB (ref 36.0–46.0)
Hemoglobin: 9.6 g/dL — ABNORMAL LOW (ref 12.0–15.0)
LYMPHS ABS: 1.8 10*3/uL (ref 0.7–4.0)
Lymphocytes Relative: 20 %
MCH: 23.1 pg — AB (ref 26.0–34.0)
MCHC: 31.9 g/dL (ref 30.0–36.0)
MCV: 72.5 fL — AB (ref 78.0–100.0)
MONO ABS: 0.5 10*3/uL (ref 0.1–1.0)
MONOS PCT: 6 %
NEUTROS ABS: 6.4 10*3/uL (ref 1.7–7.7)
Neutrophils Relative %: 70 %
Platelets: 300 10*3/uL (ref 150–400)
RBC: 4.15 MIL/uL (ref 3.87–5.11)
RDW: 16.3 % — AB (ref 11.5–15.5)
WBC: 9.1 10*3/uL (ref 4.0–10.5)

## 2015-09-07 LAB — COMPREHENSIVE METABOLIC PANEL
ALBUMIN: 2.8 g/dL — AB (ref 3.5–5.0)
ALK PHOS: 78 U/L (ref 38–126)
ALT: 23 U/L (ref 14–54)
ANION GAP: 10 (ref 5–15)
AST: 35 U/L (ref 15–41)
BILIRUBIN TOTAL: 0.3 mg/dL (ref 0.3–1.2)
BUN: 7 mg/dL (ref 6–20)
CALCIUM: 9.4 mg/dL (ref 8.9–10.3)
CO2: 25 mmol/L (ref 22–32)
Chloride: 102 mmol/L (ref 101–111)
Creatinine, Ser: 0.98 mg/dL (ref 0.44–1.00)
GFR, EST NON AFRICAN AMERICAN: 54 mL/min — AB (ref >60)
Glucose, Bld: 96 mg/dL (ref 65–99)
POTASSIUM: 3.6 mmol/L (ref 3.5–5.1)
Sodium: 137 mmol/L (ref 135–145)
TOTAL PROTEIN: 6.2 g/dL — AB (ref 6.5–8.1)

## 2015-09-07 LAB — COXSACKIE A VIRUS ANTIBODIES

## 2015-09-07 MED ORDER — METOPROLOL TARTRATE 1 MG/ML IV SOLN
5.0000 mg | Freq: Once | INTRAVENOUS | Status: AC
  Administered 2015-09-07: 5 mg via INTRAVENOUS
  Filled 2015-09-07: qty 5

## 2015-09-07 MED ORDER — CARVEDILOL 6.25 MG PO TABS
18.7500 mg | ORAL_TABLET | Freq: Two times a day (BID) | ORAL | Status: DC
  Administered 2015-09-07 – 2015-09-10 (×7): 18.75 mg via ORAL
  Filled 2015-09-07 (×14): qty 1

## 2015-09-07 MED ORDER — AMIODARONE HCL 200 MG PO TABS
200.0000 mg | ORAL_TABLET | Freq: Two times a day (BID) | ORAL | Status: DC
  Administered 2015-09-07 – 2015-09-10 (×7): 200 mg via ORAL
  Filled 2015-09-07 (×7): qty 1

## 2015-09-07 MED ORDER — DILTIAZEM HCL 25 MG/5ML IV SOLN
10.0000 mg | Freq: Once | INTRAVENOUS | Status: AC
  Administered 2015-09-07: 10 mg via INTRAVENOUS
  Filled 2015-09-07: qty 5

## 2015-09-07 MED ORDER — CARVEDILOL 6.25 MG PO TABS: 18.7500 mg | ORAL_TABLET | Freq: Two times a day (BID) | ORAL | Status: DC

## 2015-09-07 NOTE — Progress Notes (Signed)
Physical Therapy Note  Patient Details  Name: Julie Stein MRN: [id number] Date of Birth: 06-Nov-1935 Today's Date: 09/07/2015    Attempted to see pt for evaluation at 0900; unable to perform eval due to medical complications and therapist alerted to pt hold by PA at this time. Will attempt to see pt this afternoon if cleared for therapy.    Vista Lawman 09/07/2015, 9:33 AM

## 2015-09-07 NOTE — Significant Event (Signed)
Rapid Response Event Note  Overview: Time Called: 0937 Arrival Time: 0840 Event Type: Cardiac  Initial Focused Assessment: Dr Shirlee Latch at bedside for morning rounds assessing patient.  Discovered HR 180s Patient alert and orient, no distress, no complaints, no SOB or CP.  She does not feel her heart racing. Lung sounds clear, Heart tones regular. BP 150/120 manual, SVT 180s,  RR 22 O2 sat 98%  Interventions: 12 lead EKG done (Dr Shirlee Latch reviewed) Left PIV started, good blood return good flush 0914:  5mg  Lopressor given IV ST 163  BP 190/115  RR 24. Patient without complaint. 7939:  10mg  Cardizem given IV Converted to SR 80-90s while pushing Cardizem BP 150-80  SR 89,  occ brief, inc HR to 110s  1020  SR 80s with occ PVC, BP 150/90 Scheduled AM meds given PO (see MAR) Patient feeling well, 2 daughters at bedside. 12 lead EKG done with patient in SR  RN to call if assistance needed.  Event Summary: Name of Physician Notified: Dr Shirlee Latch at bedside at      at    Outcome: Stayed in room and stabalized  Event End Time: 1028  Marcellina Millin

## 2015-09-07 NOTE — Evaluation (Signed)
Occupational Therapy Assessment and Plan  Patient Details  Name: Julie Stein MRN: [id number] Date of Birth: [birth year]  OT Diagnosis: muscle weakness (generalized) Rehab Potential: Rehab Potential (ACUTE ONLY): Good ELOS: 5-7 days   Today's Date: 09/07/2015 OT Individual Time: 4098-1191 OT Individual Time Calculation (min): 65 min     Problem List:  Patient Active Problem List   Diagnosis Date Noted  . Pain in the chest   . H/O: CVA (cerebrovascular accident)   . Syncope and collapse 08/31/2015  . Syncope 08/31/2015  . Chronic systolic heart failure ([place]) 08/31/2015  . Microcytic hypochromic anemia 08/31/2015  . Chest pain   . Hypertension   . Acute on chronic systolic CHF (congestive heart failure) ([place])   . Acute bilat watershed infarction [facility]) 08/29/2015  . Weakness   . Encounter for central line placement   . Acute respiratory failure with hypercapnia ([place]) 08/26/2015  . Essential hypertension 08/26/2015  . Acute systolic CHF (congestive heart failure), NYHA class 4 ([place]) 08/26/2015  . Pulmonary edema 08/26/2015  . Paroxysmal SVT (supraventricular tachycardia) ([place]) 08/26/2015  . Respiratory failure with hypoxia ([place]) 08/26/2015  . Acute heart failure ([place])   . Acute respiratory failure with hypoxia ([place])   . SVT (supraventricular tachycardia) ([facility])     Past Medical History:  Past Medical History  Diagnosis Date  . Hypertension   . CHF (congestive heart failure) ([place])   . Stroke ([place])   . SVT (supraventricular tachycardia) ([place])   . CAD (coronary artery disease)   . Cardiomyopathy [facility])    Past Surgical History:  Past Surgical History  Procedure Laterality Date  . Cardiac catheterization N/A 08/27/2015    Procedure: Right/Left Heart Cath and Coronary Angiography;  Surgeon: Larey Dresser, MD;  Location: [place] [facility] CV LAB;  Service: Cardiovascular;  Laterality: N/A;  . Knee surgery    . Electrophysiologic study N/A 09/05/2015    Procedure: SVT Ablation;   Surgeon: Will Meredith Leeds, MD;  Location: [place] CV LAB;  Service: Cardiovascular;  Laterality: N/A;    Assessment & Plan Clinical Impression: Patient is a 80 y.o. right handed female history of hypertension. Patient lives with grandson and his wife large extended supportive family. Independent prior to admission and active. One level home 6 steps to entry. Presented 08/26/2015 with sudden onset of shortness of breath noted recent bouts of diarrhea, congestion and cough. Chest x-ray shows significant pulmonary edema. EKG with frequent PVCs bigeminy 2 runs of SVT heart rate 200 bpm. She became hypotensive placed on amiodarone drip. Bedside echocardiogram with ejection fraction 20-25% diffuse hypokinesis, RV functioning well. Placed on broad-spectrum antibiotic suspect viral illness. Troponin mildly elevated 0.22 days 0.29. Cardiology service is consulted and underwent cardiac catheterization showing severe distal LAD stenosis moderate ostial large OMI stenosis. Advised medical management. Post catheterization noted left-sided weakness. MRI of the brain showed small volume patchy cortical and subcortical ischemic infarcts within the left frontal and right frontoparietal region with additional tiny cortical infarcts within the right occipital lobe. MRA of the head with no major vessel occlusion or stenosis. Placed on aspirin for CVA prophylaxis. Subcutaneous heparin for DVT prophylaxis. Physical therapy evaluation completed 08/29/2015 with recommendations of physical medicine rehabilitation consult. Patient was admitted for comprehensive rehabilitation program 08/29/2015. On 08/31/2015 developed some nonspecific chest pressure lasting perhaps an hour and resolve spontaneously. Later she was up to the bathroom urinating had a syncopal event noted asystole lasted approximately less than a minute. CODE BLUE was called was given at least  2-3 minutes at bedtime chest compressions and regained pulse became more  alert and awake. She did not recall the incident. She was discharged to acute care services for ongoing monitoring and workup. Troponin was 0.05. Cardiology follow-up. Chest x-ray showed questionable infiltrate right lower lobe probable right small pleural effusion similar to prior. EKG SVT with frequent PVCs. Question vagal episode associated with micturition versus arrhythmia versus orthostasis. Ongoing bouts of tachycardia SVT heart rate had been in the 180s and had been maintained on intravenous Cardizem.Synthia Innocent EP study 09/05/2015 unable to trigger an arrhythmia. Rate has been controlled and advised to continue Coreg for now. Therapies have been reinitiated with progressive gains.   Patient transferred to CIR on 09/06/2015 .    Patient currently requires min with basic self-care skills secondary to muscle weakness, decreased cardiorespiratoy endurance and unbalanced muscle activation.  Prior to hospitalization, patient could complete ADLs with independent .  Patient will benefit from skilled intervention to increase independence with basic self-care skills and increase activity tolerance with minimal increase in HR prior to discharge home with care partner.  Anticipate patient will require intermittent supervision and follow up home health.  OT - End of Session Activity Tolerance: Tolerates 30+ min activity with multiple rests Endurance Deficit: Yes Endurance Deficit Description: requires rest breaks following mobility tasks with elevated HR OT Assessment Rehab Potential (ACUTE ONLY): Good OT Patient demonstrates impairments in the following area(s): Balance;Endurance;Motor;Safety OT Basic ADL's Functional Problem(s): Grooming;Bathing;Dressing;Toileting OT Advanced ADL's Functional Problem(s): Simple Meal Preparation;Laundry OT Transfers Functional Problem(s): Toilet;Tub/Shower OT Additional Impairment(s): None OT Plan OT Intensity: Minimum of 1-2 x/day, 45 to 90 minutes OT Frequency: 5  out of 7 days OT Duration/Estimated Length of Stay: 5-7 days OT Treatment/Interventions: Balance/vestibular training;Community reintegration;Discharge planning;Disease mangement/prevention;DME/adaptive equipment instruction;Functional mobility training;Neuromuscular re-education;Pain management;Patient/family education;Psychosocial support;Self Care/advanced ADL retraining;Therapeutic Activities;Therapeutic Exercise;UE/LE Strength taining/ROM;UE/LE Coordination activities;Visual/perceptual remediation/compensation OT Self Feeding Anticipated Outcome(s): No goal OT Basic Self-Care Anticipated Outcome(s): supervision/setup- Mod I OT Toileting Anticipated Outcome(s): supervision/setup OT Bathroom Transfers Anticipated Outcome(s): supervision/setup OT Recommendation Recommendations for Other Services: Neuropsych consult Patient destination: Home Follow Up Recommendations: Home health OT Equipment Recommended: None recommended by OT   Skilled Therapeutic Intervention OT eval completed with discussion of rehab process, OT purpose, POC, goals, and ELOS.  ADL assessment completed at EOB with setup for supplies and min/steady assist with LB bathing and dressing.  Pt ambulated to sink and completed oral hygiene in standing.  Upon return to recliner pt BP and HR elevated, however asymptomatic.  Cardiac MD present and ordered EKG.  Will follow as able.  OT Evaluation Precautions/Restrictions  Precautions Precautions: Fall Restrictions Weight Bearing Restrictions: No General   Vital Signs Therapy Vitals Temp: 98.2 F (36.8 C) Temp Source: Oral Pulse Rate: (!) 160 Resp: 17 BP: (!) 152/117 mmHg Patient Position (if appropriate): Sitting Oxygen Therapy SpO2: 100 % O2 Device: Not Delivered Pain Pain Assessment Pain Assessment: No/denies pain Home Living/Prior Functioning Home Living Family/patient expects to be discharged to:: Private residence Living Arrangements: Other  relatives Available Help at Discharge: Family, Available 24 hours/day (grandson and his wife in school during the day MWF, but other family members can assist) Type of Home: House Home Access: Stairs to enter Technical brewer of Steps: 6 Entrance Stairs-Rails: Right, Left, Can reach both Home Layout: One level Bathroom Shower/Tub: Gaffer, Architectural technologist: Standard Bathroom Accessibility: Yes Additional Comments: Pt was gardening, doing yard work, cleaned house - was fully independent PTA.   Lives With: Other (Comment) (grandson  and his wife) IADL History Homemaking Responsibilities: Yes Meal Prep Responsibility: Primary Laundry Responsibility: Primary Cleaning Responsibility: Primary Shopping Responsibility: Primary Current License: Yes Mode of Transportation: Car Occupation: Retired Prior Function Level of Independence: Independent with basic ADLs, Independent with homemaking with ambulation, Independent with gait, Independent with transfers  Able to Take Stairs?: Yes Driving: Yes Vocation: Retired Leisure: Hobbies-yes (Comment) Comments: enjoys gardening ADL   Vision/Perception  Vision- History Baseline Vision/History: Wears glasses Wears Glasses: Reading only Patient Visual Report: No change from baseline Vision- Assessment Vision Assessment?: Yes Eye Alignment: Within Functional Limits Ocular Range of Motion: Within Functional Limits Alignment/Gaze Preference: Within Defined Limits Tracking/Visual Pursuits: Decreased smoothness of horizontal tracking;Requires cues, head turns, or add eye shifts to track Visual Fields: No apparent deficits Depth Perception:  (WNL) Perception Comments: WFL  Cognition Overall Cognitive Status: Within Functional Limits for tasks assessed Arousal/Alertness: Awake/alert Orientation Level: Person;Place;Situation Person: Oriented Place: Oriented Situation: Oriented Year: 2016 Month: October Day of Week:  Correct Memory: Appears intact Immediate Memory Recall: Sock;Blue;Bed Memory Recall: Sock;Blue;Bed Memory Recall Sock: Without Cue Memory Recall Blue: Without Cue Memory Recall Bed: Without Cue Attention: Sustained;Alternating Sustained Attention: Appears intact Sensation Sensation Light Touch: Appears Intact Stereognosis: Not tested Hot/Cold: Not tested Proprioception: Appears Intact Coordination Gross Motor Movements are Fluid and Coordinated: Yes Fine Motor Movements are Fluid and Coordinated: Yes Finger Nose Finger Test: [facility] Extremity/Trunk Assessment RUE Assessment RUE Assessment: Within Functional Limits LUE Assessment LUE Assessment: Within Functional Limits   See Function Navigator for Current Functional Status.   Refer to Care Plan for Long Term Goals  Recommendations for other services: None  Discharge Criteria: Patient will be discharged from OT if patient refuses treatment 3 consecutive times without medical reason, if treatment goals not met, if there is a change in medical status, if patient makes no progress towards goals or if patient is discharged from hospital.  The above assessment, treatment plan, treatment alternatives and goals were discussed and mutually agreed upon: by patient and by family  Ellwood Dense [facility] 09/07/2015, 9:14 AM

## 2015-09-07 NOTE — Progress Notes (Signed)
Social Work  Social Work Assessment and Plan  Patient Details  Name: Julie Stein MRN: [id number] Date of Birth: March 13, 1935  Today's Date: 09/07/2015  Problem List:  Patient Active Problem List   Diagnosis Date Noted  . Pain in the chest   . H/O: CVA (cerebrovascular accident)   . Syncope and collapse 08/31/2015  . Syncope 08/31/2015  . Chronic systolic heart failure (HCC) 08/31/2015  . Microcytic hypochromic anemia 08/31/2015  . Chest pain   . Hypertension   . Acute on chronic systolic CHF (congestive heart failure) (H[facility])   . Acute bilat watershed infarction [facility]) 08/29/2015  . Weakness   . Encounter for central line placement   . Acute respiratory failure with hypercapnia (H[facility]) 08/26/2015  . Essential hypertension 08/26/2015  . Acute systolic CHF (congestive heart failure), NYHA class 4 (HCC) 08/26/2015  . Pulmonary edema 08/26/2015  . Paroxysmal SVT (supraventricular tachycardia) (HCC) 08/26/2015  . Respiratory failure with hypoxia ([facility]CC) 08/26/2015  . Acute heart failure (HCC)   . Acute respiratory failure with hypoxia (HCC)   . SVT (supraventricular tachycardia) ([facility])    Past Medical History:  Past Medical History  Diagnosis Date  . Hypertension   . CHF (congestive heart failure) (H[facility])   . Stroke (H[facility])   . SVT (supraventricular tachycardia) (HCC)   . CAD (coronary artery disease)   . Cardiomyopathy [facility])    Past Surgical History:  Past Surgical History  Procedure Laterality Date  . Cardiac catheterization N/A 08/27/2015    Procedure: Right/Left Heart Cath and Coronary Angiography;  Surgeon: Laurey Morale, MD;  Location: [facility] INVASIVE CV LAB;  Service: Cardiovascular;  Laterality: N/A;  . Knee surgery    . Electrophysiologic study N/A 09/05/2015    Procedure: SVT Ablation;  Surgeon: Will Jorja Loa, MD;  Location: [facility] INVASIVE CV LAB;  Service: Cardiovascular;  Laterality: N/A;   Social History:  reports that she has never smoked. She does not have any  smokeless tobacco history on file. She reports that she does not drink alcohol or use illicit drugs.  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 Caregiver Availability: 24/7 Family Dynamics: Very close knit family who are there for one another and will do whatever pt requires, she has always been there for them.  She has extended family who are supportive and involved. She is very pleased wiht how well she is doing and feels she will go home soon.  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 Family members are always here with her.   Abuse/Neglect Physical Abuse: Denies Verbal Abuse: Denies Sexual Abuse: Denies Exploitation of patient/patient's resources: Denies Self-Neglect: Denies  Emotional Status Pt's affect, behavior adn adjustment status: Pt is feeling she will go home soon, she is moving better it is just her heart issues now.  She had an episode this am and cardiac MD was involved and with medicines it has resolved. She has gotten stronger from when she was here before and feels her endurance is improved. Her family is here to  cheer her on. Recent Psychosocial Issues: health issues being discovered since admission to the hospital-but being managed now Pyschiatric History: No issues-deferred depression screen due to doing well  and felt it is not necessary. Will monitor and intervene if needed.  P tis bright and upbeat no concerns expressed. Substance Abuse History: No issues  Patient / Family Perceptions, Expectations & Goals Pt/Family understanding of illness & functional limitations: Pt and daughter's have a good understanding of Mom's stroke and cardiac issues.  They speak with the MD's rounding daily and feel their questions and concerns are being addressed.  Aware of episode this am and feel being addressed. Premorbid pt/family roles/activities: Mother, grandmother, retiree, church member, etc Anticipated changes in roles/activities/participation: plans to resume Pt/family expectations/goals: Pt states: " I am getting there, I'm walking without anything."  Daughter states: " We hope her cardiac issues are resolved this is what scares [place], just not knowing."  [employer]: None Premorbid Home Care/DME Agencies: None Transportation available at discharge: Family members Resource referrals recommended: Support group (specify)  Discharge Planning Living Arrangements: Other relatives Support Systems: Children, Other relatives, Manufacturing engineer, Psychologist, clinical community Type of Residence: Private residence Community education officer Resources: Armed forces operational officer, [employer] (specify county) (applying for [employer]) Surveyor, quantity Resources: Restaurant manager, fast food Screen Referred: Yes Living Expenses: Lives with family Money Management: Patient, Family Does the patient have any problems obtaining your medications?: Yes (Describe) (was not seeing a MD prior to admission to the hospital) Home Management: Pt and family members Patient/Family Preliminary Plans: Return home with grandson and his wife who go to school during the am and then granddaughter in-law is there in the afternoon. Between all of the family pt will have 24 hr supervision if necessary. Pt should pnly be here a short time due to high level. Social Work  Anticipated Follow Up Needs: HH/OP, Support Group, Other (comment) (PCP) DC Planning Additional Notes/Comments: Will set up with [facility] upon discharge  Clinical Impression Pleasant active patient who is willing to do whatever she needs to do to regain her independence here. She is doing very well functionally but medical issues are being addressed. Pt has applied for Medicaid to assist with the co-pays and deductibles of Medicare. Will await team's evaluations, all aware short length of stay on CIR.  Will set up with [facility] for PCP to follow upon discharge.  Lucy Chris 09/07/2015, 1:15 PM

## 2015-09-07 NOTE — Progress Notes (Signed)
Occupational Therapy Session Note  Patient Details  Name: Chee Sulek MRN: [id number] Date of Birth: July 13, 1935  Today's Date: 09/07/2015 OT Individual Time: 3435-6861 OT Individual Time Calculation (min): 30 min    Short Term Goals: Week 1:  OT Short Term Goal 1 (Week 1): STG = LTGs due to ELOS  Skilled Therapeutic Interventions/Progress Updates:    Treatment session with focus on functional mobility and energy conservation.  Monitored vitals prior to mobility and afterwards with BP and HR maintaining WNL.  Completed bed mobility, ambulated to toilet with min/steady assist to complete toilet transfer.  Pt with 1 instance of "stagger" stepping but able to correct with cues.  Educated on energy conservation strategies and pacing herself during self-care tasks and IADLs.  Therapy Documentation Precautions:  Precautions Precautions: Fall Restrictions Weight Bearing Restrictions: No General:   Vital Signs: Therapy Vitals Temp: 98 F (36.7 C) Temp Source: Oral Pulse Rate: 79 Resp: 20 BP: 126/70 mmHg Patient Position (if appropriate): Lying Oxygen Therapy SpO2: 92 % O2 Device: Not Delivered Pain:  Pt with no c/o pain  See Function Navigator for Current Functional Status.   Therapy/Group: Individual Therapy  Rosalio Loud 09/07/2015, 3:26 PM

## 2015-09-07 NOTE — Plan of Care (Addendum)
Overall Plan of Care [facility]) Patient Details Name: Julie Stein MRN: [id number] DOB: [birth year]  Admitting Diagnosis: Watershed infarction  Hospital Problems: Active Problems:   Acute bilat watershed infarction [facility])     Functional Problem List: Nursing Endurance, Pain, Safety, Skin Integrity, Bladder, Bowel  PT Balance, Safety, Endurance  OT Balance, Endurance, Motor, Safety  SLP    TR         Basic ADL's: OT Grooming, Bathing, Dressing, Toileting     Advanced  ADL's: OT Simple Meal Preparation, Laundry     Transfers: PT Bed Mobility, Bed to Chair, Car, [employer], Civil Service fast streamer, Research scientist (life sciences): PT Psychologist, prison and probation services, Stairs, Ambulation     Additional Impairments: OT None  SLP        TR      Anticipated Outcomes Item Anticipated Outcome  Self Feeding No goal  Swallowing      Basic self-care  supervision/setup- Mod I  Toileting  supervision/setup   Bathroom Transfers supervision/setup  Bowel/Bladder  mod I  Transfers  mod I  Locomotion  supervision with LRAD  Communication     Cognition     Pain  less than 3  Safety/Judgment  mod I   Therapy Plan: PT Intensity: Minimum of 1-2 x/day ,45 to 90 minutes PT Frequency: 5 out of 7 days PT Duration Estimated Length of Stay: 5-7 days OT Intensity: Minimum of 1-2 x/day, 45 to 90 minutes OT Frequency: 5 out of 7 days OT Duration/Estimated Length of Stay: 5-7 days         Team Interventions: Nursing Interventions Patient/Family Education, Disease Management/Prevention, Medication Management, Skin Care/Wound Management, Discharge Planning, Psychosocial Support  PT interventions Ambulation/gait training, Balance/vestibular training, Discharge planning, Disease management/prevention, Functional electrical stimulation, DME/adaptive equipment instruction, Functional mobility training, Neuromuscular re-education, Patient/family education, Psychosocial support, Stair training, Therapeutic Exercise,  Therapeutic Activities, UE/LE Strength taining/ROM, UE/LE Coordination activities, Wheelchair propulsion/positioning  OT Interventions Warden/ranger, Firefighter, Discharge planning, Disease mangement/prevention, Fish farm manager, Functional mobility training, Neuromuscular re-education, Pain management, Patient/family education, Psychosocial support, Self Care/advanced ADL retraining, Therapeutic Activities, Therapeutic Exercise, UE/LE Strength taining/ROM, UE/LE Coordination activities, Visual/perceptual remediation/compensation  SLP Interventions    TR Interventions    SW/CM Interventions Discharge Planning, Psychosocial Support, Patient/Family Education    Team Discharge Planning: Destination: PT-Home ,OT- Home , SLP-  Projected Follow-up: PT-Outpatient PT, OT-  Home health OT, SLP-  Projected Equipment Needs: PT-To be determined, OT- None recommended by OT, SLP-  Equipment Details: PT- , OT-  Patient/family involved in discharge planning: PT- Family member/caregiver, Patient,  OT-Patient, Family member/caregiver, SLP-   MD ELOS: ~7 days Medical Rehab Prognosis:  Good Assessment: 80 y.o. right handed female history of hypertension. Presented 08/26/2015 with sudden onset of shortness of breath noted recent bouts of diarrhea, congestion and cough. Chest x-ray shows significant pulmonary edema. EKG with frequent PVCs bigeminy 2 runs of SVT heart rate 200 bpm. She became hypotensive placed on amiodarone drip. Bedside echocardiogram with ejection fraction 20-25% diffuse hypokinesis, RV functioning well. Placed on broad-spectrum antibiotic suspect viral illness. Cardiology service was consulted and underwent cardiac catheterization showing severe distal LAD stenosis moderate ostial large OMI stenosis. Advised medical management. Post catheterization noted left-sided weakness. MRI of the brain showed small volume patchy cortical and subcortical ischemic  infarcts within the left frontal and right frontoparietal region with additional tiny cortical infarcts within the right occipital lobe. MRA of the head with no major vessel occlusion or stenosis. Patient was admitted for  comprehensive rehabilitation program 08/29/2015. On 08/31/2015 developed some nonspecific chest pressure lasting perhaps an hour and resolve spontaneously. Later she was up to the bathroom urinating had a syncopal event noted asystole lasted approximately less than a minute. CODE BLUE was called was given at least 2-3 minutes at bedtime chest compressions and regained pulse became more alert and awake. She did not recall the incident. She was discharged to acute care services for ongoing monitoring and workup. Troponin was 0.05. Cardiology follow-up. Chest x-ray showed questionable infiltrate right lower lobe probable right small pleural effusion similar to prior. EKG SVT with frequent PVCs. Question vagal episode associated with micturition versus arrhythmia versus orthostasis. Ongoing bouts of tachycardia SVT heart rate had been in the 180s and had been maintained on intravenous Cardizem.Lottie Rater EP study 09/05/2015 unable to trigger an arrhythmia    Now requiring 24/7 Rehab RN,MD, as well as CIR level PT, OT and SLP.  Treatment team will focus on ADLs and mobility with goals set at Sup See Team Conference Notes for weekly updates to the plan of care

## 2015-09-07 NOTE — Progress Notes (Signed)
Updated note:  Pt in SVT after 1 session of therapies.  Spoke with Cardiologist, who recommended medication adjustments and monitoring.  Should pt not return to SR quickly, per Cardiology, pt will be returned to acute hospital.  Cardiology PA to evaluate pt.    After 5mg  IV Lopressor given HR decreased from 180s to high 160s.

## 2015-09-07 NOTE — Evaluation (Signed)
Physical Therapy Assessment and Plan  Patient Details  Name: Julie Stein MRN: [id number] Date of Birth: January 24, 1935  PT Diagnosis: Abnormality of gait, Difficulty walking and Muscle weakness Rehab Potential: Good ELOS: 5-7 days   Today's Date: 09/07/2015 PT Individual Time: 1110-1150 PT Individual Time Calculation (min): 40 min    Problem List:  Patient Active Problem List   Diagnosis Date Noted  . Pain in the chest   . H/O: CVA (cerebrovascular accident)   . Syncope and collapse 08/31/2015  . Syncope 08/31/2015  . Chronic systolic heart failure ([place]) 08/31/2015  . Microcytic hypochromic anemia 08/31/2015  . Chest pain   . Hypertension   . Acute on chronic systolic CHF (congestive heart failure) ([place])   . Acute bilat watershed infarction [facility] 08/29/2015  . Weakness   . Encounter for central line placement   . Acute respiratory failure with hypercapnia ([place]) 08/26/2015  . Essential hypertension 08/26/2015  . Acute systolic CHF (congestive heart failure), NYHA class 4 ([place]) 08/26/2015  . Pulmonary edema 08/26/2015  . Paroxysmal SVT (supraventricular tachycardia) ([place]) 08/26/2015  . Respiratory failure with hypoxia ([place]) 08/26/2015  . Acute heart failure ([place])   . Acute respiratory failure with hypoxia ([place])   . SVT (supraventricular tachycardia) ([facility])     Past Medical History:  Past Medical History  Diagnosis Date  . Hypertension   . CHF (congestive heart failure) ([place])   . Stroke ([place])   . SVT (supraventricular tachycardia) ([place])   . CAD (coronary artery disease)   . Cardiomyopathy [facility])    Past Surgical History:  Past Surgical History  Procedure Laterality Date  . Cardiac catheterization N/A 08/27/2015    Procedure: Right/Left Heart Cath and Coronary Angiography;  Surgeon: Larey Dresser, MD;  Location: [place] CV LAB;  Service: Cardiovascular;  Laterality: N/A;  . Knee surgery    . Electrophysiologic study N/A 09/05/2015    Procedure: SVT Ablation;   Surgeon: Will Meredith Leeds, MD;  Location: [place] CV LAB;  Service: Cardiovascular;  Laterality: N/A;    Assessment & Plan Clinical Impression: A 80 y.o. right handed female history of hypertension. Patient lives with grandson and his wife large extended supportive family. Independent prior to admission and active. One level home 6 steps to entry. Presented 08/26/2015 with sudden onset of shortness of breath noted recent bouts of diarrhea, congestion and cough. Chest x-ray shows significant pulmonary edema. EKG with frequent PVCs bigeminy 2 runs of SVT heart rate 200 bpm. She became hypotensive placed on amiodarone drip. Bedside echocardiogram with ejection fraction 20-25% diffuse hypokinesis, RV functioning well. Placed on broad-spectrum antibiotic suspect viral illness. Troponin mildly elevated 0.22 days 0.29. Cardiology service is consulted and underwent cardiac catheterization showing severe distal LAD stenosis moderate ostial large OMI stenosis. Advised medical management. Post catheterization noted left-sided weakness. MRI of the brain showed small volume patchy cortical and subcortical ischemic infarcts within the left frontal and right frontoparietal region with additional tiny cortical infarcts within the right occipital lobe. MRA of the head with no major vessel occlusion or stenosis. Placed on aspirin for CVA prophylaxis. Subcutaneous heparin for DVT prophylaxis. Physical therapy evaluation completed 08/29/2015 with recommendations of physical medicine rehabilitation consult. Patient was admitted for comprehensive rehabilitation program 08/29/2015. On 08/31/2015 developed some nonspecific chest pressure lasting perhaps an hour and resolve spontaneously. Later she was up to the bathroom urinating had a syncopal event noted asystole lasted approximately less than a minute. CODE BLUE was called was given at least 2-3 minutes  at bedtime chest compressions and regained pulse became more alert and  awake. She did not recall the incident. She was discharged to acute care services for ongoing monitoring and workup. Troponin was 0.05. Cardiology follow-up. Chest x-ray showed questionable infiltrate right lower lobe probable right small pleural effusion similar to prior. EKG SVT with frequent PVCs. Question vagal episode associated with micturition versus arrhythmia versus orthostasis. Ongoing bouts of tachycardia SVT heart rate had been in the 180s and had been maintained on intravenous Cardizem.Synthia Innocent EP study 09/05/2015 unable to trigger an arrhythmia. Rate has been controlled and advised to continue Coreg for now. Therapies have been reinitiated with progressive gains. Patient to be admitted for comprehensive inpatient rehabilitation program. Patient transferred to CIR on 09/06/2015 .   Patient currently requires supervision with mobility secondary to decreased cardiorespiratoy endurance and decreased sitting balance, decreased standing balance, decreased postural control and decreased balance strategies.  Prior to hospitalization, patient was independent  with mobility and lived with Other (Comment) (grandson and wife) in a House home.  Home access is 2 (6 in front with two rails, 2 in back with R rail)Stairs to enter.  Patient will benefit from skilled PT intervention to maximize safe functional mobility, minimize fall risk and decrease caregiver burden for planned discharge home with 24 hour assist.  Anticipate patient will benefit from follow up OP at discharge.  PT - End of Session Activity Tolerance: Tolerates 30+ min activity with multiple rests Endurance Deficit: Yes Endurance Deficit Description: requires rest breaks following mobility tasks with elevated HR PT Assessment Rehab Potential (ACUTE/IP ONLY): Good PT Patient demonstrates impairments in the following area(s): Balance;Safety;Endurance PT Transfers Functional Problem(s): Bed Mobility;Bed to Chair;Car;Furniture;Floor PT  Locomotion Functional Problem(s): Wheelchair Mobility;Stairs;Ambulation PT Plan PT Intensity: Minimum of 1-2 x/day ,45 to 90 minutes PT Frequency: 5 out of 7 days PT Duration Estimated Length of Stay: 5-7 days PT Treatment/Interventions: Ambulation/gait training;Balance/vestibular training;Discharge planning;Disease management/prevention;Functional electrical stimulation;DME/adaptive equipment instruction;Functional mobility training;Neuromuscular re-education;Patient/family education;Psychosocial support;Stair training;Therapeutic Exercise;Therapeutic Activities;UE/LE Strength taining/ROM;UE/LE Coordination activities;Wheelchair propulsion/positioning PT Transfers Anticipated Outcome(s): mod I PT Locomotion Anticipated Outcome(s): supervision with LRAD PT Recommendation Follow Up Recommendations: Outpatient PT Patient destination: Home Equipment Recommended: To be determined  Skilled Therapeutic Intervention Spoke with MD prior to visit; cleared therapist to perform bed level evaluation with ambulation held at this time due to medical complications. Pt received supine in bed, no c/o pain and agreeable to treatment.  Initial PT evaluation performed; unable to assess transfers, gait, stairs due to medical hold. Supine vitals assessed; HR 80bpm, BP 131/81 mmHg, O2 98%. Performed bed mobility with supervision, including sit <>supine. Seated balance on EOB for MMT and seated balance assessment. Initial sitting vitals: 156/90 mmHg, HR 91 bpm, Performed Function in Sitting Test as described below. After test, vitals 149/95, HR 88bpm, O2 100%. Pt returned to supine position with supervision, vitals reassessed 141/92 mmHg, HR 81 bpm, O2 99%. Pt and family educated on rehab goals, daily schedule, use of call bell system for assistance, estimated length of stay. Pt remained supine in bed with daughter present at completion of session.   PT Evaluation Precautions/Restrictions Precautions Precautions:  Fall Restrictions Weight Bearing Restrictions: No General Chart Reviewed: Yes Vital SignsTherapy Vitals Temp: 98.7 F (37.1 C) Pulse Rate: 78 Resp: 18 BP: 135/78 mmHg Patient Position (if appropriate): Lying Oxygen Therapy SpO2: 100 % O2 Device: Not Delivered Pain Pain Assessment Pain Assessment: No/denies pain Pain Score: 0-No pain Home Living/Prior Functioning Home Living Available Help at Discharge: Family;Available 24  hours/day (Grandson and wife in school MWF, other family available during those times) Type of Home: House Home Access: Stairs to enter [employer] of Steps: 2 (6 in front with two rails, 2 in back with R rail) Entrance Stairs-Rails: Right;Left;Can reach both Home Layout: One level Additional Comments: Pt was gardening, doing yard work, cleaned house - was fully independent PTA.   Lives With: Other (Comment) (grandson and wife) Prior Function Level of Independence: Independent with basic ADLs;Independent with homemaking with ambulation;Independent with gait;Independent with transfers  Able to Take Stairs?: Yes Driving: Yes Vocation: Retired Leisure: Hobbies-yes (Comment) Comments: gardening, visit flea markets and yard Press photographer, reading Vision/Perception  Vision - Assessment Eye Alignment: Within Functional Limits Ocular Range of Motion: Within Functional Limits Alignment/Gaze Preference: Within Defined Limits Perception Comments: WFL  Cognition Overall Cognitive Status: Within Functional Limits for tasks assessed Arousal/Alertness: Awake/alert Orientation Level: Oriented X4 Attention: Sustained;Alternating Sustained Attention: Appears intact Sensation Sensation Light Touch: Appears Intact Proprioception: Appears Intact (accurate 10/10 BLE hallux) Coordination Gross Motor Movements are Fluid and Coordinated: Yes Fine Motor Movements are Fluid and Coordinated: Yes Heel Shin Test: [facility] Motor  Motor Motor - Skilled Clinical Observations:  mild L hemiparesis  Mobility Bed Mobility Bed Mobility: Rolling Right;Rolling Left;Supine to Sit;Sit to Supine Rolling Right: 5: Supervision Rolling Right Details: Verbal cues for precautions/safety Rolling Left: 5: Supervision Rolling Left Details: Verbal cues for precautions/safety Supine to Sit: 5: Supervision Supine to Sit Details: Verbal cues for precautions/safety Sit to Supine: 5: Supervision Sit to Supine - Details: Verbal cues for precautions/safety Transfers Transfers: No (held d/t medical complications) Locomotion  Ambulation Ambulation: No (held d/t medical complications)  Trunk/Postural Assessment  Cervical Assessment Cervical Assessment: Within Functional Limits Thoracic Assessment Thoracic Assessment: Within Functional Limits Lumbar Assessment Lumbar Assessment: Within Functional Limits Postural Control Postural Control: Within Functional Limits  Balance Balance Balance Assessed: Yes Static Sitting Balance Static Sitting - Balance Support: Feet supported;No upper extremity supported Static Sitting - Level of Assistance: 6: Modified independent (Device/Increase time) Static Sitting - Comment/# of Minutes: x2 min EOB while talking vitals Dynamic Sitting Balance Dynamic Sitting - Balance Support: Feet supported;During functional activity;No upper extremity supported Dynamic Sitting - Level of Assistance: 5: Stand by assistance Sitting balance - Comments: Function In Sitting Test- 52/56 with impairments in foward/lateral reaching, picking object off floor, anterior nudge Static Standing Balance Static Standing - Level of Assistance: Not tested (comment) (NT due to medical hold) Dynamic Standing Balance Dynamic Standing - Level of Assistance: Not tested (comment) (NT due to medical hold) Extremity Assessment  RUE Assessment RUE Assessment: Within Functional Limits LUE Assessment LUE Assessment: Within Functional Limits RLE Assessment RLE Assessment: Within  Functional Limits LLE Assessment LLE Assessment: Within Functional Limits   See Function Navigator for Current Functional Status.   Refer to Care Plan for Long Term Goals  Recommendations for other services: None  Discharge Criteria: Patient will be discharged from PT if patient refuses treatment 3 consecutive times without medical reason, if treatment goals not met, if there is a change in medical status, if patient makes no progress towards goals or if patient is discharged from hospital.  The above assessment, treatment plan, treatment alternatives and goals were discussed and mutually agreed upon: by patient and by family  Luberta Mutter 09/07/2015, 12:38 PM

## 2015-09-07 NOTE — Progress Notes (Signed)
Patient ID: Julie Stein, female   DOB: June 15, 1935, 80 y.o.   MRN: [id number]   SUBJECTIVE:  Feels good, BP running high still.  However, when I saw her this morning, HR was up to around 160 bpm on my count, likely back in SVT.     Scheduled Meds: . aspirin EC  81 mg Oral Daily  . atorvastatin  80 mg Oral q1800  . carvedilol  18.75 mg Oral BID WC  . heparin subcutaneous  5,000 Units Subcutaneous 3 times per day  . Influenza vac split quadrivalent PF  0.5 mL Intramuscular Tomorrow-1000  . isosorbide-hydrALAZINE  0.5 tablet Oral TID  . losartan  25 mg Oral Daily  . pneumococcal 23 valent vaccine  0.5 mL Intramuscular Tomorrow-1000  . spironolactone  25 mg Oral Daily   Continuous Infusions:   PRN Meds:.acetaminophen, nitroGLYCERIN, ondansetron **OR** ondansetron (ZOFRAN) IV, sorbitol    Filed Vitals:   09/06/15 1745 09/06/15 1838 09/06/15 1930 09/07/15 0620  BP: 171/96 178/100 160/84 157/88  Pulse: 93   87  Temp: 98.6 F (37 C)   98.2 F (36.8 C)  TempSrc: Oral   Oral  Resp: 18   17  Height: 4\' 11"  (1.499 m)     Weight: 110 lb 14.3 oz (50.3 kg)     SpO2: 100%   100%   No intake or output data in the 24 hours ending 09/07/15 0832  LABS: Basic Metabolic Panel:  Recent Labs  25/95/63 0442 09/07/15 0349  NA 140 137  K 4.1 3.6  CL 107 102  CO2 24 25  GLUCOSE 87 96  BUN 13 7  CREATININE 1.12* 0.98  CALCIUM 9.5 9.4   Liver Function Tests:  Recent Labs  09/07/15 0349  AST 35  ALT 23  ALKPHOS 78  BILITOT 0.3  PROT 6.2*  ALBUMIN 2.8*   No results for input(s): LIPASE, AMYLASE in the last 72 hours. CBC:  Recent Labs  09/06/15 0442 09/07/15 0349  WBC 10.2 9.1  NEUTROABS  --  6.4  HGB 10.5* 9.6*  HCT 31.4* 30.1*  MCV 72.0* 72.5*  PLT 274 300   Cardiac Enzymes: No results for input(s): CKTOTAL, CKMB, CKMBINDEX, TROPONINI in the last 72 hours. BNP: Invalid input(s): POCBNP D-Dimer: No results for input(s): DDIMER in the last 72 hours. Hemoglobin  A1C: No results for input(s): HGBA1C in the last 72 hours. Fasting Lipid Panel: No results for input(s): CHOL, HDL, LDLCALC, TRIG, CHOLHDL, LDLDIRECT in the last 72 hours. Thyroid Function Tests: No results for input(s): TSH, T4TOTAL, T3FREE, THYROIDAB in the last 72 hours.  Invalid input(s): FREET3 Anemia Panel: No results for input(s): VITAMINB12, FOLATE, FERRITIN, TIBC, IRON, RETICCTPCT in the last 72 hours.  RADIOLOGY: Dg Chest 2 View  08/31/2015  CLINICAL DATA:  Dyspnea. EXAM: CHEST  2 VIEW COMPARISON:  August 31, 2015. FINDINGS: Stable cardiomediastinal silhouette. No pneumothorax is noted. Stable right basilar opacity is noted concerning for pneumonia or atelectasis with associated pleural effusion. Mild left basilar subsegmental atelectasis is noted. Bony thorax is unremarkable. IMPRESSION: Stable right basilar opacity is noted concerning for pneumonia or atelectasis with associated pleural effusion. Mild left basilar subsegmental atelectasis is noted as well. Electronically Signed   By: Lupita Raider, M.D.   On: 08/31/2015 11:00   Ct Head Wo Contrast  08/27/2015  CLINICAL DATA:  Initial evaluation for acute unresponsiveness. , left-sided weakness, now resolved. EXAM: CT HEAD WITHOUT CONTRAST TECHNIQUE: Contiguous axial images were obtained from the base of the  skull through the vertex without intravenous contrast. COMPARISON:  None. FINDINGS: Age-related cerebral volume loss present. Patchy and confluent hypodensity within the periventricular and deep white matter both cerebral hemispheres most consistent with chronic small vessel ischemic disease. Scattered vascular calcifications within the carotid siphons and distal right vertebral artery. No acute large vessel territory infarct. Gray-white matter differentiation grossly maintained. Deep gray nuclei are symmetric. No acute intracranial hemorrhage. No mass lesion, midline shift, or mass effect. No hydrocephalus. No extra-axial fluid  collection. Scalp soft tissues within normal limits. No acute abnormality about the orbits. Paranasal sinuses mastoid air cells are clear. Calvarium intact. IMPRESSION: 1. No acute intracranial process. 2. Age-related cerebral atrophy with chronic small vessel ischemic disease. Electronically Signed   By: Rise Mu M.D.   On: 08/27/2015 02:31   [name] Wo Contrast  08/28/2015  CLINICAL DATA:  Initial evaluation for transient left-sided weakness following an episode of unresponsiveness and hypotension. EXAM: MRI HEAD WITHOUT CONTRAST MRA HEAD WITHOUT CONTRAST TECHNIQUE: Multiplanar, multiecho pulse sequences of the brain and surrounding structures were obtained without intravenous contrast. Angiographic images of the head were obtained using MRA technique without contrast. COMPARISON:  Prior CT from 08/27/2015. FINDINGS: MRI HEAD FINDINGS Diffuse prominence of the CSF containing spaces is compatible with generalized age-related cerebral atrophy. Patchy and confluent T2/FLAIR hyperintensity within the periventricular and deep white matter both cerebral hemispheres most consistent with chronic small vessel ischemic disease. Small remote lacunar infarct within the periventricular white matter of the left corona radiata. There is a few small foci of patchy restricted diffusion within the high left frontal lobe and right frontoparietal regions bilaterally (series 4, image 33, 35). Additional tiny punctate cortical infarct more inferiorly within the right occipital lobe (series 4, image 26). Associated signal loss seen on corresponding ADC map. No associated hemorrhage or mass effect. Bodies consistent with small acute ischemic infarcts. Given the distribution of these findings, underlying watershed etiology is favored. Normal intravascular flow voids are maintained. Additional scattered subcentimeter foci of susceptibility artifact seen in within the peripheral cortices of the bilateral cerebral  hemispheres on gradient echo sequence, consistent with small chronic micro hemorrhages. While these could potentially be related underlying hypertension, possible amyloid angiopathy could be considered given their somewhat peripheral distribution. No mass lesion, midline shift, or mass effect. No hydrocephalus. No extra-axial fluid collection. Craniocervical junction within normal limits. Mild degenerative spondylolysis noted within the visualized upper cervical spine. Pituitary gland normal. Thinning of the anterior body of the corpus callosum noted, which may related to remote ischemia. No acute abnormality about the orbits. Mild right-sided exophthalmos. Paranasal sinuses are clear. No mastoid effusion. Inner ear structures within normal limits. Bone marrow signal intensity within normal limits. No scalp soft tissue abnormality. MRA HEAD FINDINGS ANTERIOR CIRCULATION: Visualized distal cervical segments of the internal carotid arteries are patent with antegrade flow. Petrous segments widely patent. Scattered multi focal atheromatous irregularity within the cavernous and supraclinoid left ICA. There is a short-segment moderate stenosis at the proximal supraclinoid left ICA (series 5, image 72). Cavernous and supraclinoid right ICA widely patent. Right A1 segment widely patent. Left A1 segment slightly hypoplastic. Anterior communicating artery patent. Atheromatous irregularity within the anterior cerebral arteries bilaterally. M1 segments widely patent without stenosis or occlusion. Atheromatous irregularity within the left M1 segment. MCA bifurcations within normal limits. MCA branches symmetric bilaterally. Distal small vessel disease present within the MCA branches bilaterally. POSTERIOR CIRCULATION: Vertebral arteries patent to the vertebrobasilar junction. Right vertebral artery slightly diminutive. Posterior inferior  cerebral arteries not well evaluated on this exam. Basilar artery widely patent. Small left  anterior inferior cerebral artery noted. Superior cerebellar arteries patent. Both sub posterior cerebral arteries arise from the basilar artery and are well opacified to their distal aspects. Distal small vessel disease within the PCA branches bilaterally. There is a more focal short-segment moderate stenosis within the proximal right P2 segment (series 505, image 15). No aneurysm or vascular malformation. IMPRESSION: MRI HEAD IMPRESSION: 1. Small volume patchy cortical and subcortical ischemic infarcts within the left frontal and right frontoparietal region, with additional tiny cortical infarct within the right occipital lobe. Given the distribution of these infarcts, an underlying watershed etiology is favored. No associated hemorrhage or mass effect. 2. Scattered small chronic micro hemorrhages as above. While these might be related to chronic underlying hypertension, possible amyloid angiopathy could also be considered given their somewhat peripheral distribution. 3. Atrophy with chronic microvascular ischemic disease. MRA HEAD IMPRESSION: 1. No large vessel or proximal arterial branch occlusion identified. 2. Short-segment moderate stenosis within the supraclinoid left ICA. 3. Short-segment moderate stenosis within the proximal right P2 segment. 4. Distal small vessel atheromatous disease within the MCA, ACA, and PCA branches bilaterally Electronically Signed   By: Rise Mu M.D.   On: 08/28/2015 02:26   Mr Brain Wo Contrast  08/28/2015  CLINICAL DATA:  Initial evaluation for transient left-sided weakness following an episode of unresponsiveness and hypotension. EXAM: MRI HEAD WITHOUT CONTRAST MRA HEAD WITHOUT CONTRAST TECHNIQUE: Multiplanar, multiecho pulse sequences of the brain and surrounding structures were obtained without intravenous contrast. Angiographic images of the head were obtained using MRA technique without contrast. COMPARISON:  Prior CT from 08/27/2015. FINDINGS: MRI HEAD  FINDINGS Diffuse prominence of the CSF containing spaces is compatible with generalized age-related cerebral atrophy. Patchy and confluent T2/FLAIR hyperintensity within the periventricular and deep white matter both cerebral hemispheres most consistent with chronic small vessel ischemic disease. Small remote lacunar infarct within the periventricular white matter of the left corona radiata. There is a few small foci of patchy restricted diffusion within the high left frontal lobe and right frontoparietal regions bilaterally (series 4, image 33, 35). Additional tiny punctate cortical infarct more inferiorly within the right occipital lobe (series 4, image 26). Associated signal loss seen on corresponding ADC map. No associated hemorrhage or mass effect. Bodies consistent with small acute ischemic infarcts. Given the distribution of these findings, underlying watershed etiology is favored. Normal intravascular flow voids are maintained. Additional scattered subcentimeter foci of susceptibility artifact seen in within the peripheral cortices of the bilateral cerebral hemispheres on gradient echo sequence, consistent with small chronic micro hemorrhages. While these could potentially be related underlying hypertension, possible amyloid angiopathy could be considered given their somewhat peripheral distribution. No mass lesion, midline shift, or mass effect. No hydrocephalus. No extra-axial fluid collection. Craniocervical junction within normal limits. Mild degenerative spondylolysis noted within the visualized upper cervical spine. Pituitary gland normal. Thinning of the anterior body of the corpus callosum noted, which may related to remote ischemia. No acute abnormality about the orbits. Mild right-sided exophthalmos. Paranasal sinuses are clear. No mastoid effusion. Inner ear structures within normal limits. Bone marrow signal intensity within normal limits. No scalp soft tissue abnormality. MRA HEAD FINDINGS  ANTERIOR CIRCULATION: Visualized distal cervical segments of the internal carotid arteries are patent with antegrade flow. Petrous segments widely patent. Scattered multi focal atheromatous irregularity within the cavernous and supraclinoid left ICA. There is a short-segment moderate stenosis at the proximal supraclinoid left ICA (  series 5, image 72). Cavernous and supraclinoid right ICA widely patent. Right A1 segment widely patent. Left A1 segment slightly hypoplastic. Anterior communicating artery patent. Atheromatous irregularity within the anterior cerebral arteries bilaterally. M1 segments widely patent without stenosis or occlusion. Atheromatous irregularity within the left M1 segment. MCA bifurcations within normal limits. MCA branches symmetric bilaterally. Distal small vessel disease present within the MCA branches bilaterally. POSTERIOR CIRCULATION: Vertebral arteries patent to the vertebrobasilar junction. Right vertebral artery slightly diminutive. Posterior inferior cerebral arteries not well evaluated on this exam. Basilar artery widely patent. Small left anterior inferior cerebral artery noted. Superior cerebellar arteries patent. Both sub posterior cerebral arteries arise from the basilar artery and are well opacified to their distal aspects. Distal small vessel disease within the PCA branches bilaterally. There is a more focal short-segment moderate stenosis within the proximal right P2 segment (series 505, image 15). No aneurysm or vascular malformation. IMPRESSION: MRI HEAD IMPRESSION: 1. Small volume patchy cortical and subcortical ischemic infarcts within the left frontal and right frontoparietal region, with additional tiny cortical infarct within the right occipital lobe. Given the distribution of these infarcts, an underlying watershed etiology is favored. No associated hemorrhage or mass effect. 2. Scattered small chronic micro hemorrhages as above. While these might be related to chronic  underlying hypertension, possible amyloid angiopathy could also be considered given their somewhat peripheral distribution. 3. Atrophy with chronic microvascular ischemic disease. MRA HEAD IMPRESSION: 1. No large vessel or proximal arterial branch occlusion identified. 2. Short-segment moderate stenosis within the supraclinoid left ICA. 3. Short-segment moderate stenosis within the proximal right P2 segment. 4. Distal small vessel atheromatous disease within the MCA, ACA, and PCA branches bilaterally Electronically Signed   By: Rise Mu M.D.   On: 08/28/2015 02:26   Dg Chest Right Decubitus  08/31/2015  CLINICAL DATA:  Dyspnea. EXAM: CHEST - RIGHT DECUBITUS COMPARISON:  Same day. FINDINGS: Right lateral decubitus view of the chest demonstrates mild free flowing right pleural effusion. IMPRESSION: Mild free flowing right pleural effusion. Electronically Signed   By: Lupita Raider, M.D.   On: 08/31/2015 11:27   Dg Chest Port 1 View  08/31/2015  CLINICAL DATA:  Chest pain beginning after physical therapy yesterday. EXAM: PORTABLE CHEST 1 VIEW COMPARISON:  08/28/2015 FINDINGS: Borderline heart size without vascular congestion. Infiltration in the right lung base obscuring the right hemidiaphragm. Probable small right pleural effusion. Appearance suggest pneumonia. Atelectasis in the left lung base is improved since previous study. No pneumothorax. Calcified and tortuous aorta. Mild thoracic scoliosis convex towards the right. IMPRESSION: Infiltration in the right lung base with probable small right pleural effusion. Findings suggest pneumonia. Electronically Signed   By: Burman Nieves M.D.   On: 08/31/2015 03:21   Dg Chest Port 1 View  08/28/2015  CLINICAL DATA:  Shortness of breath.  Pulmonary edema. EXAM: PORTABLE CHEST 1 VIEW COMPARISON:  08/26/2015. FINDINGS: Left IJ line in stable position. Cardiomegaly with bilateral pulmonary alveolar infiltrates. Slight improvement. Persistent small  pleural effusions. No pneumothorax. IMPRESSION: 1. Left IJ line in stable position. 2. Cardiomegaly with persistent bilateral pulmonary infiltrates, partial clearing from prior exam. Persistent small pleural effusions. Electronically Signed   By: Maisie Fus  Register   On: 08/28/2015 07:18   Dg Chest Port 1 View  08/26/2015  CLINICAL DATA:  80 year old female central line placement. Initial encounter. EXAM: PORTABLE CHEST 1 VIEW COMPARISON:  1306 hours today. FINDINGS: Portable AP semi upright view at 2259 hours. Left IJ central line has been placed.  Tip is at the lower SVC level. No pneumothorax. Stable cardiac size and mediastinal contours. Interval mild regression of widespread patchy in perihilar opacity, improvement is greater in the upper lobes. Residual in the lower lobes. No large pleural effusion. IMPRESSION: 1. Left IJ central line placed, tip at the lower SVC level with no adverse features. 2. Some interval regression of pulmonary edema, residual in the lower lungs. Electronically Signed   By: Odessa Fleming M.D.   On: 08/26/2015 23:06   Dg Chest Portable 1 View  08/26/2015  CLINICAL DATA:  Shortness of breath. EXAM: PORTABLE CHEST 1 VIEW COMPARISON:  None. FINDINGS: Borderline cardiomegaly is noted. No pneumothorax or significant pleural effusion is noted. Diffuse patchy airspace opacities are noted throughout both lungs concerning for pneumonia or edema. Bony thorax is unremarkable. IMPRESSION: Diffuse patchy bilateral airspace opacities are noted concerning for pneumonia or edema. Electronically Signed   By: Lupita Raider, M.D.   On: 08/26/2015 13:24    PHYSICAL EXAM General: NAD. In Bed.  Neck: JVP 7 cm, no thyromegaly or thyroid nodule.  Lungs: Clear to auscultation bilaterally with normal respiratory effort. CV: Nondisplaced PMI.  Heart regular S1/S2 with S3 gallop, no murmur.  No peripheral edema.   Abdomen: Soft, nontender, no hepatosplenomegaly, no distention.  Neurologic: Alert and  oriented x 3.  Psych: Normal affect. Extremities: No clubbing or cyanosis.   TELEMETRY: NSR   ASSESSMENT AND PLAN: Ms Whitsitt was readmitted 10/14 after syncopal episode requiring brief CPR.  1. Syncope: Occurred while on bedside commode. Required brief CPR. No meds required. ? Vagal episode associated with micturation versus arrhythmia.  Also with soft BP, we cut back her cardiac meds.  Concerned now regarding recurrent episodes of SVT with rate around 180 which may have been culprit => unfortunately, she was not on telemetry when event occurred.  - She has not been orthostatic when measured. - We cut back on her cardiac meds, BP has been stable with this change but now running higher.  - She had EP study  => unable to trigger an arrhythmia.   - With Rapid SVT in setting of cardiomyopathy as possible culprit of syncope, think I would hold off on Lifevest.  2. Chronic Systolic HF: Primarily nonischemic cardiomyopathy. ECHO 10/9 EF 20-25% with LVH. Volume status ok. No diuretics.  - Increase Coreg to 18.75 mg bid with recurrent SVT.  - Continue current doses of losartan, spironolactone, and Bidil.      3. CAD: LHC 10/10 with 20% proximal LAD, 40% mid LAD, 90% distal, large high OM1 with 80% ostial stenosis. Medically managed. Troponin normal this admission.  - Contine ASA and statin.  4. CVA: MRI head last admission showed small infarcts in watershed region. She has left leg weakness that pre-dated her cardiac cath, ?related to uncontrolled HTN.  5. H/o SVT: Required amiodarone briefly last admission. 10/16 back in SVT and briefly on diltiazem gtt with conversion to NSR.  No further SVT.  ?rapid SVT in setting of low EF as contributor to syncopal event (along with iatrogenic low BP). Had EP study, unable to trigger arrhythmia.  She appears to be having recurrent SVT at this time with HR in 160s.  - Increase Coreg to 18.75 mg bid.  - Lopressor 5 mg IV now, can give IV diltiazem if  needed.  - Start amiodarone 200 mg bid, looks like we will need this to keep her out of SVT.  6. HTN: BP high but in setting of  SVT. Renal artery dopplers did not show RAS.  7. Pulmonary: Persistent right-sided infiltrate on CXR, decreased BS right base. Does not appear changed from prior. Normal WBCs, no fever. Had course of azithromycin. ?atelectasis with small effusion.   8. Needs ongoing PT/OT.  9. DVT prophylaxis: Subcutaneous heparin.   Marca Ancona 09/07/2015 8:32 AM

## 2015-09-07 NOTE — Care Management (Signed)
[facility] Individual Statement of Services  Patient Name:  Julie Stein  Date:  09/07/2015  Welcome [facility] the [facility].  Our goal is to provide you with an individualized program based on your diagnosis and situation, designed to meet your specific needs.  With this comprehensive rehabilitation program, you will be expected to participate in at least 3 hours of rehabilitation therapies Monday-Friday, with modified therapy programming on the weekends.  Your rehabilitation program will include the following services:  Physical Therapy (PT), Occupational Therapy (OT), Speech Therapy (ST), 24 hour per day rehabilitation nursing, Case Management (Social Worker), Rehabilitation Medicine, Nutrition Services and Pharmacy Services  Weekly team conferences will be held on Wednesday to discuss your progress.  Your Social Worker will talk with you frequently to get your input and to update you on team discussions.  Team conferences with you and your family in attendance may also be held.  Expected length of stay: 5-7 days  Overall anticipated outcome: mod/i level  Depending on your progress and recovery, your program may change. Your Social Worker will coordinate services and will keep you informed of any changes. Your Social Worker's name and contact numbers are listed  below.  The following services may also be recommended but are not provided by the [facility]:   Driving Evaluations  Home Health Rehabiltiation Services  Outpatient Rehabilitation Services    Arrangements will be made to provide these services after discharge if needed.  Arrangements include referral to agencies that provide these services.  Your insurance has been verified to be:  Medicare part A only Your primary doctor is:  None  Pertinent information will be shared with your doctor and your insurance company.  Social Worker:  Dossie Der, SW [phone] or [phone]  Information discussed with and copy given to patient by: Lucy Chris, 09/07/2015, 11:08 AM

## 2015-09-07 NOTE — Plan of Care (Signed)
Problem: RH PAIN MANAGEMENT Goal: RH STG PAIN MANAGED AT OR BELOW PT'S PAIN GOAL Pain <3 on a scale of 0-10  Outcome: Progressing No c/o pain

## 2015-09-07 NOTE — Progress Notes (Signed)
[place] PHYSICAL MEDICINE & REHABILITATION     PROGRESS NOTE    Subjective/Complaints: Patient states she slept well overnight. She says that she is glad to be back and rehabilitation and is looking forward to restarting her therapies.  ROS: Denies CP, SOB, N/B/D  Objective: Vital Signs: Blood pressure 157/88, pulse 87, temperature 98.2 F (36.8 C), temperature source Oral, resp. rate 17, height 4\' 11"  (1.499 m), weight 50.3 kg (110 lb 14.3 oz), SpO2 100 %. No results found.  Recent Labs  09/05/15 1435 09/06/15 0442  WBC 7.2 10.2  HGB 9.6* 10.5*  HCT 29.8* 31.4*  PLT 230 274    Recent Labs  09/06/15 0442 09/07/15 0349  NA 140 137  K 4.1 3.6  CL 107 102  GLUCOSE 87 96  BUN 13 7  CREATININE 1.12* 0.98  CALCIUM 9.5 9.4   CBG (last 3)  No results for input(s): GLUCAP in the last 72 hours.  Wt Readings from Last 3 Encounters:  09/06/15 50.3 kg (110 lb 14.3 oz)  09/06/15 48.172 kg (106 lb 3.2 oz)  08/26/15 49.6 kg (109 lb 5.6 oz)    Physical Exam:  BP 157/88 mmHg  Pulse 87  Temp(Src) 98.2 F (36.8 C) (Oral)  Resp 17  Ht 4\' 11"  (1.499 m)  Wt 50.3 kg (110 lb 14.3 oz)  BMI 22.39 kg/m2  SpO2 100% Constitutional: NAD. She appears well-developed and well-nourished.  HENT:  Head: Normocephalic and atraumatic.  Eyes: Conjunctivae and EOM are normal.  Neck: Normal range of motion. Neck supple. No thyromegaly present.  Cardiovascular: Normal rate and regular rhythm.  Respiratory: Effort normal and breath sounds normal. No respiratory distress.  GI: Soft. Bowel sounds are normal. She exhibits no distension. There is no tenderness.  Musculoskeletal: She exhibits no edema.  Antigravity strength throughout Neurological: She is alert and oriented.   Follows full commands. Fair awareness of deficits Left trunk lean Skin: Skin is warm and dry.  Psychiatric: She has a normal mood and affect. Her behavior is normal   Assessment/Plan: 1. 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 Physiatrist is providing close team supervision and 24 hour management of active medical problems listed below. Physiatrist and rehab team continue to assess barriers to discharge/monitor patient progress toward functional and medical goals.  Function:  Bathing Bathing position      Bathing parts      Bathing assist        Upper Body Dressing/Undressing Upper body dressing                    Upper body assist        Lower Body Dressing/Undressing Lower body dressing                                  Lower body assist        Toileting Toileting          Toileting assist     Transfers Chair/bed transfer             Locomotion Ambulation           Wheelchair          Cognition Comprehension Comprehension assist level: Follows basic conversation/direction with extra time/assistive device  Expression Expression assist level: Expresses basic needs/ideas: With extra time/assistive  device  Social Interaction Social Interaction assist level: Interacts appropriately with others - No medications needed.  Problem Solving Problem solving assist level: Solves basic 90% of the time/requires cueing < 10% of the time  Memory Memory assist level: Complete Independence: No helper    Medical Problem List and Plan: 1. Functional deficits secondary to watershed pattern infarct likely due to hypoperfusion after initial cardiac catheterization 2. DVT Prophylaxis/Anticoagulation: Subcutaneous heparin. Monitor platelet counts in any signs of bleeding 3. Pain Management: Tylenol as needed 4. CAD/syncope. Continue aspirin therapy 5. Neuropsych: This patient is capable of making decisions on her own behalf. 6. Skin/Wound Care: Routine skin checks 7. Fluids/Electrolytes/Nutrition: Routine I&O with  follow-up chemistries  Labs reviewed on 10/21, stable.  8. Hypertension/SVT. EP study 09/05/2015 unable to trigger arrhythmia. Coreg 12.5 mg twice a day, Bidil 20-37.5 mg 3 times a day, Cozaar 25 mg daily, Aldactone 12.5 mg daily. Monitor with increased mobility 9. Hyperlipidemia. Lipitor  LOS (Days) 1 A FACE TO FACE EVALUATION WAS PERFORMED  Julie Stein Julie Stein 09/07/2015 7:50 AM

## 2015-09-08 ENCOUNTER — Inpatient Hospital Stay (HOSPITAL_COMMUNITY): Payer: Medicare Other | Admitting: Occupational Therapy

## 2015-09-08 ENCOUNTER — Inpatient Hospital Stay (HOSPITAL_COMMUNITY): Payer: Medicare Other | Admitting: *Deleted

## 2015-09-08 LAB — BASIC METABOLIC PANEL
ANION GAP: 7 (ref 5–15)
BUN: 9 mg/dL (ref 6–20)
CHLORIDE: 102 mmol/L (ref 101–111)
CO2: 26 mmol/L (ref 22–32)
CREATININE: 1.19 mg/dL — AB (ref 0.44–1.00)
Calcium: 9.4 mg/dL (ref 8.9–10.3)
GFR calc non Af Amer: 43 mL/min — ABNORMAL LOW (ref >60)
GFR, EST AFRICAN AMERICAN: 49 mL/min — AB (ref >60)
Glucose, Bld: 105 mg/dL — ABNORMAL HIGH (ref 65–99)
POTASSIUM: 3.7 mmol/L (ref 3.5–5.1)
SODIUM: 135 mmol/L (ref 135–145)

## 2015-09-08 NOTE — Progress Notes (Signed)
Physical Therapy Session Note  Patient Details  Name: Julie Stein MRN: [id number] Date of Birth: 12/03/1934  Today's Date: 09/08/2015    Skilled Therapeutic Interventions/Progress Updates:   Session I 0900-1000 (60 min )   Patient in room , communicated with nurse prior to session to obtain clearance for therapy. Patient VS checked and as followed : BP 127/86 HR 78. Initiated session with w/c propulsion to the gym.  Training in gait w/o AD with SBA to min A for gait due to slight L side drift and occasional LOB. Training in maneuvering steps 2x3- 6 inch steps up and down, cued for step to pattern, B rails. Car transfer with increased need for tactile and verbal cues for technique-SBA to min A for safety.  Gait training with turns and maneuvering around obstacles with SBA. Vs monitored during tx,post activity patient with BP 161/92 HR 84.  Training in bed mobility w/o AD or rail -SBA Berg balance assessment performed with score of 33/ 56 indicating high risk for falls,but significant improvement from last test administered 5 days ago.  Patient left in room with all needs within reach and daughter present.   Session II 1345-1415 ( )  Patient in room , agrees to therapy session, BP checked by nursing [facility].  Gait Training with no AD, min to SBA for safety .  Balance training in standing on foam board and  Crossing mid line to reach for horse shoes and hang the on opposite side on basketball rim.Focus on weight shifting, reaching outside BOS and core strengthening. Kinetron 2 x 1 min in standing in order to improve weight shifting and balance.  Patient was able to participate in gait training back to room, with verbal cues for step length and width as well as to slightly increase velocity.  At the end of session patient left in room with all needs within reach and dtr present.    Therapy Documentation Precautions:  Precautions Precautions: Fall Restrictions Weight Bearing  Restrictions: No Vital Signs: Therapy Vitals Pulse Rate: 81 BP: 127/86 mmHg Patient Position (if appropriate): Sitting Oxygen Therapy SpO2: 98 % O2 Device: Not Delivered  Balance: Berg Balance Test Sit to Stand: Able to stand  independently using hands Standing Unsupported: Able to stand 2 minutes with supervision Sitting with Back Unsupported but Feet Supported on Floor or Stool: Able to sit safely and securely 2 minutes Stand to Sit: Sits safely with minimal use of hands Transfers: Able to transfer safely, definite need of hands Standing Unsupported with Eyes Closed: Able to stand 10 seconds with supervision Standing Ubsupported with Feet Together: Able to place feet together independently but unable to hold for 30 seconds From Standing, Reach Forward with Outstretched Arm: Can reach forward >5 cm safely (2") From Standing Position, Pick up Object from Floor: Able to pick up shoe, needs supervision From Standing Position, Turn to Look Behind Over each Shoulder: Needs supervision when turning Turn 360 Degrees: Able to turn 360 degrees safely but slowly Standing Unsupported, Alternately Place Feet on Step/Stool: Able to complete >2 steps/needs minimal assist Standing Unsupported, One Foot in Front: Needs help to step but can hold 15 seconds Standing on One Leg: Tries to lift leg/unable to hold 3 seconds but remains standing independently Total Score: 33   See Function Navigator for Current Functional Status.   Therapy/Group: Individual Therapy  Dorna Mai 09/08/2015, 10:53 AM

## 2015-09-08 NOTE — Progress Notes (Signed)
Occupational Therapy Session Note  Patient Details  Name: Julie Stein MRN: [id number] Date of Birth: [birth year]  Today's Date: 09/08/2015 OT Individual Time: 6803-2122 and 1300-1330 OT Individual Time Calculation (min): 54 min and 30 min    Short Term Goals: Week 1:  OT Short Term Goal 1 (Week 1): STG = LTGs due to ELOS  Skilled Therapeutic Interventions/Progress Updates:  Session 1: Upon entering the room, Pt supine in bed with daughter present in room. Pt with no c/o pain this session. BP and HR monitored throughout session and WNL during this time. Pt reporting, "They gave me my medication before you got her. It should help." Pt declined shower this morning but agreeable to bathing and dressing at sink side this session. Pt standing for majority of task with min verbal cues to sit for doffing of LB clothing items and LB bathing for safety. Pt requiring steady assist for balance while standing to complete tasks. Pt ambulating short distances in room without use of AD and required SBA - min hand held assist for safety. Pt dressing from recliner chair with sit <>stand for LB dressing. Pt seated with breakfast tray placed in front her with call bell and all needed items within reach. Daughter remains present in room.  Session 2: Upon entering the room, pt seated in recliner chair with daughter present in room and no c/o pain this session. BP taken multiple times with BP and HR WNL this session. Pt ambulated 100' to ADL apartment with no AD and steady assist for balance. Pt seated for rest break secondary to fatigue. Pt ambulating around bed to tidy comforter and pillow with 1 LOB requiring min A to correct. OT then demonstrated simulated shower transfer with shower chair. Pt returning demonstration with steady assist. Pt returning to room at end of session in same manner as above. Pt seated in recliner chair with call bell and all needed items within reach.   Therapy Documentation Precautions:   Precautions Precautions: Fall Restrictions Weight Bearing Restrictions: No Vital Signs: Therapy Vitals Temp: 98.6 F (37 C) Temp Source: Oral Pulse Rate: 83 Resp: 17 BP: 139/80 mmHg Patient Position (if appropriate): Lying Oxygen Therapy SpO2: 95 % O2 Device: Not Delivered  See Function Navigator for Current Functional Status.   Therapy/Group: Individual Therapy  Lowella Grip 09/08/2015, 7:54 AM

## 2015-09-08 NOTE — Progress Notes (Signed)
[place] PHYSICAL MEDICINE & REHABILITATION     PROGRESS NOTE    Subjective/Complaints: Patient states that she had a rough morning yesterday, but the remainder of her day went well. She slept well overnight and is looking forward to the day today.  ROS: Denies CP, SOB, N/B/D  Objective: Vital Signs: Blood pressure 139/80, pulse 83, temperature 98.6 F (37 C), temperature source Oral, resp. rate 17, height  (1.499 m), weight 50.3 kg (110 lb 14.3 oz), SpO2 95 %. No results found.  Recent Labs  09/06/15 0442 09/07/15 0349  WBC 10.2 9.1  HGB 10.5* 9.6*  HCT 31.4* 30.1*  PLT 274 300    Recent Labs  09/07/15 0349 09/08/15 0233  NA 137 135  K 3.6 3.7  CL 102 102  GLUCOSE 96 105*  BUN 7 9  CREATININE 0.98 1.19*  CALCIUM 9.4 9.4   CBG (last 3)  No results for input(s): GLUCAP in the last 72 hours.  Wt Readings from Last 3 Encounters:  09/06/15 50.3 kg (110 lb 14.3 oz)  09/06/15 48.172 kg (106 lb 3.2 oz)  08/26/15 49.6 kg (109 lb 5.6 oz)    Physical Exam:  BP 139/80 mmHg  Pulse 83  Temp(Src) 98.6 F (37 C) (Oral)  Resp 17  Ht  (1.499 m)  Wt 50.3 kg (110 lb 14.3 oz)  BMI 22.39 kg/m2  SpO2 95% Constitutional: NAD. She appears well-developed and well-nourished.  HENT:  Head: Normocephalic and atraumatic.  Eyes: Conjunctivae and EOM are normal.  Neck: Normal range of motion. Neck supple. No thyromegaly present.  Cardiovascular: Normal rate, regular rhythm.  Respiratory: Effort normal and breath sounds normal. No respiratory distress.  GI: Soft. Bowel sounds are normal. She exhibits no distension. There is no tenderness.  Musculoskeletal: She exhibits no edema.  Antigravity strength throughout Neurological: She is alert and oriented.   Follows full commands. Fair awareness of deficits Left trunk lean Skin: Skin is warm and dry.  Psychiatric: She has a normal mood and affect. Her behavior is normal   Assessment/Plan: 1. 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 Physiatrist is providing close team supervision and 24 hour management of active medical problems listed below. Physiatrist and rehab team continue to assess barriers to discharge/monitor patient progress toward functional and medical goals.  Function:  Bathing Bathing position   Position: Sitting EOB  Bathing parts Body parts bathed by patient: Right arm, Left arm, Chest, Abdomen, Right upper leg, Left upper leg, Right lower leg, Left lower leg Body parts bathed by helper: Back  Bathing assist Assist Level: Touching or steadying assistance(Pt > 75%)      Upper Body Dressing/Undressing Upper body dressing   What is the patient wearing?: Bra, Pull over shirt/dress Bra - Perfomed by patient: Thread/unthread right bra strap, Thread/unthread left bra strap, Hook/unhook bra (pull down sports bra)   Pull over shirt/dress - Perfomed by patient: Thread/unthread right sleeve, Thread/unthread left sleeve, Put head through opening, Pull shirt over trunk          Upper body assist Assist Level: Set up   Set up : To obtain clothing/put away  Lower Body Dressing/Undressing Lower body dressing   What is the patient wearing?: Underwear, Pants, Socks, Shoes Underwear - Performed by patient: Thread/unthread right underwear leg, Thread/unthread left underwear leg, Pull underwear up/down   Pants- Performed by patient: Thread/unthread right  pants leg, Thread/unthread left pants leg, Pull pants up/down       Socks - Performed by patient: Don/doff right sock, Don/doff left sock   Shoes - Performed by patient: Don/doff right shoe, Don/doff left shoe, Fasten right, Fasten left            Lower body assist Assist for lower body dressing: Set up, Touching or steadying assistance (Pt > 75%)   Set up : To obtain clothing/put away   Toileting Toileting          Toileting assist     Transfers Chair/bed transfer Chair/bed transfer activity did not occur: Safety/medical concerns           Locomotion Ambulation Ambulation activity did not occur: Safety/medical concerns         Wheelchair          Cognition Comprehension Comprehension assist level: Follows basic conversation/direction with no assist  Expression Expression assist level: Expresses basic needs/ideas: With extra time/assistive device  Social Interaction Social Interaction assist level: Interacts appropriately with others - No medications needed.  Problem Solving Problem solving assist level: Solves basic 90% of the time/requires cueing < 10% of the time  Memory Memory assist level: Complete Independence: No helper    Medical Problem List and Plan: 1. Functional deficits secondary to watershed pattern infarct likely due to hypoperfusion after initial cardiac catheterization 2. DVT Prophylaxis/Anticoagulation: Subcutaneous heparin. Monitor platelet counts in any signs of bleeding 3. Pain Management: Tylenol as needed 4. CAD/syncope. Continue aspirin therapy 5. Neuropsych: This patient is capable of making decisions on her own behalf. 6. Skin/Wound Care: Routine skin checks 7. Fluids/Electrolytes/Nutrition: Routine I&O with follow-up chemistries  Labs reviewed on 10/22, stable.  8. Hypertension/SVT. EP study 09/05/2015 unable to trigger arrhythmia. Coreg increased to 18.75 BID on 10/21, Bidil 20-37.5, 1/2 tab mg 3 times a day, Cozaar 25 mg daily, Aldactone increased to 24mg  10/21. Amio 200 BID added Monitor with increased mobility  - HR 83 this AM.   9. Hyperlipidemia. Lipitor  LOS (Days) 2 A FACE TO FACE EVALUATION WAS PERFORMED  Krishiv Sandler Karis Juba 09/08/2015 7:24 AM

## 2015-09-09 ENCOUNTER — Inpatient Hospital Stay (HOSPITAL_COMMUNITY): Payer: Medicare Other | Admitting: Occupational Therapy

## 2015-09-09 NOTE — Progress Notes (Signed)
[place] PHYSICAL MEDICINE & REHABILITATION     PROGRESS NOTE    Subjective/Complaints: Pt slept well overnight.  Daughter is at bedside.  She has questions regarding pt's ability to take vitamin supplements on discharge.  Informed daughter, it depends on the supplement, so she should check with her PCP.   ROS: Denies CP, SOB, N/B/D  Objective: Vital Signs: Blood pressure 137/75, pulse 73, temperature 98.3 F (36.8 C), temperature source Oral, resp. rate 17, height  (1.499 m), weight 50.3 kg (110 lb 14.3 oz), SpO2 100 %. No results found.  Recent Labs  09/07/15 0349  WBC 9.1  HGB 9.6*  HCT 30.1*  PLT 300    Recent Labs  09/07/15 0349 09/08/15 0233  NA 137 135  K 3.6 3.7  CL 102 102  GLUCOSE 96 105*  BUN 7 9  CREATININE 0.98 1.19*  CALCIUM 9.4 9.4   CBG (last 3)  No results for input(s): GLUCAP in the last 72 hours.  Wt Readings from Last 3 Encounters:  09/06/15 50.3 kg (110 lb 14.3 oz)  09/06/15 48.172 kg (106 lb 3.2 oz)  08/26/15 49.6 kg (109 lb 5.6 oz)    Physical Exam:  BP 137/75 mmHg  Pulse 73  Temp(Src) 98.3 F (36.8 C) (Oral)  Resp 17  Ht  (1.499 m)  Wt 50.3 kg (110 lb 14.3 oz)  BMI 22.39 kg/m2  SpO2 100% Constitutional: NAD. She appears well-developed and well-nourished.  HENT:  Head: Normocephalic and atraumatic.  Eyes: Conjunctivae and EOM are normal.  Neck: Normal range of motion. Neck supple. No thyromegaly present.  Cardiovascular: Normal rate, regular rhythm.  Respiratory: Effort normal and breath sounds normal. No respiratory distress.  GI: Soft. Bowel sounds are normal. She exhibits no distension. There is no tenderness.  Musculoskeletal: She exhibits no edema.  Antigravity strength throughout Neurological: She is alert and oriented.   Follows full commands. Fair awareness of deficits Left trunk lean Skin: Skin is warm and dry.  Psychiatric: She has a normal mood and affect. Her behavior is  normal   Assessment/Plan: 1. 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 Physiatrist is providing close team supervision and 24 hour management of active medical problems listed below. Physiatrist and rehab team continue to assess barriers to discharge/monitor patient progress toward functional and medical goals.  Function:  Bathing Bathing position   Position: Standing at sink  Bathing parts Body parts bathed by patient: Right arm, Left arm, Chest, Abdomen, Right upper leg, Left upper leg, Right lower leg, Left lower leg, Buttocks, Front perineal area Body parts bathed by helper: Back  Bathing assist Assist Level: Touching or steadying assistance(Pt > 75%)      Upper Body Dressing/Undressing Upper body dressing   What is the patient wearing?: Bra, Pull over shirt/dress Bra - Perfomed by patient: Thread/unthread right bra strap, Thread/unthread left bra strap, Hook/unhook bra (pull down sports bra)   Pull over shirt/dress - Perfomed by patient: Thread/unthread right sleeve, Thread/unthread left sleeve, Put head through opening, Pull shirt over trunk          Upper body assist Assist Level: Set up   Set up : To obtain clothing/put away  Lower Body Dressing/Undressing Lower body dressing   What is the patient wearing?: Underwear, Pants, Socks, Shoes Underwear - Performed by patient: Thread/unthread right underwear leg, Thread/unthread left underwear leg, Pull underwear up/down  Pants- Performed by patient: Thread/unthread right pants leg, Thread/unthread left pants leg, Pull pants up/down       Socks - Performed by patient: Don/doff right sock, Don/doff left sock   Shoes - Performed by patient: Don/doff right shoe, Don/doff left shoe, Fasten right, Fasten left            Lower body assist Assist for lower body dressing: Set up, Touching  or steadying assistance (Pt > 75%)   Set up : To obtain clothing/put away  Toileting Toileting          Toileting assist     Transfers Chair/bed transfer Chair/bed transfer activity did not occur: Safety/medical concerns Chair/bed transfer method: Stand pivot Chair/bed transfer assist level: Supervision or verbal cues Chair/bed transfer assistive device: Armrests     Locomotion Ambulation Ambulation activity did not occur: Safety/medical concerns   Max distance: 100 Assist level: Touching or steadying assistance (Pt > 75%)   Wheelchair   Type: Manual Max wheelchair distance: 120 Assist Level: Supervision or verbal cues  Cognition Comprehension Comprehension assist level: Follows basic conversation/direction with no assist  Expression Expression assist level: Expresses basic needs/ideas: With extra time/assistive device  Social Interaction Social Interaction assist level: Interacts appropriately with others - No medications needed.  Problem Solving Problem solving assist level: Solves basic 90% of the time/requires cueing < 10% of the time  Memory Memory assist level: Complete Independence: No helper    Medical Problem List and Plan: 1. Functional deficits secondary to watershed pattern infarct likely due to hypoperfusion after initial cardiac catheterization 2. DVT Prophylaxis/Anticoagulation: Subcutaneous heparin. Monitor platelet counts in any signs of bleeding 3. Pain Management: Tylenol as needed 4. CAD/syncope. Continue aspirin therapy 5. Neuropsych: This patient is capable of making decisions on her own behalf. 6. Skin/Wound Care: Routine skin checks 7. Fluids/Electrolytes/Nutrition: Routine I&O with follow-up chemistries  Labs reviewed on 10/22, stable.  8. Hypertension/SVT. EP study 09/05/2015 unable to trigger arrhythmia. Coreg increased to 18.75 BID on 10/21, Bidil 20-37.5, 1/2 tab mg 3 times a day, Cozaar 25 mg daily, Aldactone increased to 24mg  10/21. Amio 200  BID added Monitor with increased mobility  - HR 73 this AM.    -BP 137/75 this AM 9. Hyperlipidemia. Lipitor  LOS (Days) 3 A FACE TO FACE EVALUATION WAS PERFORMED  Ankit Karis Juba 09/09/2015 7:30 AM

## 2015-09-10 ENCOUNTER — Inpatient Hospital Stay (HOSPITAL_COMMUNITY): Payer: Medicare Other | Admitting: Physical Therapy

## 2015-09-10 ENCOUNTER — Inpatient Hospital Stay (HOSPITAL_COMMUNITY): Payer: Medicare Other | Admitting: Occupational Therapy

## 2015-09-10 DIAGNOSIS — R269 Unspecified abnormalities of gait and mobility: Secondary | ICD-10-CM

## 2015-09-10 DIAGNOSIS — I5023 Acute on chronic systolic (congestive) heart failure: Secondary | ICD-10-CM

## 2015-09-10 DIAGNOSIS — I638 Other cerebral infarction: Secondary | ICD-10-CM

## 2015-09-10 DIAGNOSIS — I69398 Other sequelae of cerebral infarction: Secondary | ICD-10-CM

## 2015-09-10 MED ORDER — LOSARTAN POTASSIUM 25 MG PO TABS
25.0000 mg | ORAL_TABLET | Freq: Once | ORAL | Status: AC
  Administered 2015-09-10: 25 mg via ORAL
  Filled 2015-09-10: qty 1

## 2015-09-10 MED ORDER — SPIRONOLACTONE 25 MG PO TABS: 25.0000 mg | ORAL_TABLET | Freq: Every day | ORAL | Status: DC

## 2015-09-10 MED ORDER — AMIODARONE HCL 200 MG PO TABS: ORAL_TABLET | ORAL | Status: DC

## 2015-09-10 MED ORDER — CARVEDILOL 6.25 MG PO TABS: 18.7500 mg | ORAL_TABLET | Freq: Two times a day (BID) | ORAL | Status: DC

## 2015-09-10 MED ORDER — ISOSORB DINITRATE-HYDRALAZINE 20-37.5 MG PO TABS: 0.5000 | ORAL_TABLET | Freq: Three times a day (TID) | ORAL | Status: DC

## 2015-09-10 MED ORDER — ATORVASTATIN CALCIUM 80 MG PO TABS: 80.0000 mg | ORAL_TABLET | Freq: Every day | ORAL | Status: DC

## 2015-09-10 MED ORDER — NITROGLYCERIN 0.4 MG SL SUBL: 0.4000 mg | SUBLINGUAL_TABLET | SUBLINGUAL | Status: DC | PRN

## 2015-09-10 MED ORDER — LOSARTAN POTASSIUM 50 MG PO TABS: 50.0000 mg | ORAL_TABLET | Freq: Every day | ORAL | Status: DC

## 2015-09-10 NOTE — Progress Notes (Signed)
Social Work  Discharge Note  The overall goal for the admission was met for:   Discharge location: [place]  Length of Stay: Yes-4 DAYS  Discharge activity level: Yes-SUPERVISION/MOD/I LEVEL  Home/community participation: Yes  Services provided included: MD, RD, PT, OT, RN, CM, Pharmacy and SW  Financial Services: Medicare  Follow-up services arranged: Home Health: [place]  CARE-PT,OT,RN and Patient/Family has no preference for HH/DME agencies  Comments (or additional information):FMAILY STAYING HERE WITH PT AND PREFER DISCHARGE END OF TODAY INSTEAD OF TOMORROW. [facility] PCP SET UP-10/27 @ 11:15 AM. MEDICAID APPLICATION PENDING  Patient/Family verbalized understanding of follow-up arrangements: Yes  Individual responsible for coordination of the follow-up plan: RASHEEDAH-DAUGHTER & SELF  Confirmed correct DME delivered: Elease Hashimoto 09/10/2015    Elease Hashimoto

## 2015-09-10 NOTE — Discharge Summary (Signed)
[name], Julie Stein                  ACCOUNT NO.:  [id number]  MEDICAL RECORD NO.:  [id number]  LOCATION:  4M01C                        FACILITY:  [facility]  PHYSICIAN:  Erick Colace, M.D.DATE OF BIRTH:  03/01/1935  DATE OF ADMISSION:  09/06/2015 DATE OF DISCHARGE:  09/10/2015                              DISCHARGE SUMMARY   DISCHARGE DIAGNOSES: 1. 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 2. Subcutaneous heparin for deep vein thrombosis prophylaxis. 3. Hypertension with supraventricular tachycardia. 4. Hyperlipidemia.  HISTORY OF PRESENT ILLNESS:  This is a 80 year old right-handed female with history of hypertension, lives with her grandson and his extended family.  Independent prior to admission.  One-level home, 6 steps to entry.  Presented on August 26, 2015, with sudden onset of shortness of breath, recent bouts of diarrhea, congestion, and cough.  Chest x-ray showed significant pulmonary edema.  EKG with frequent PVCs.  SVT, heart rate 200.  Became hypotensive, placed on amiodarone drip.  Bedside echocardiogram with ejection fraction 20-25%, diffuse hypokinesis. Placed on broad-spectrum antibiotics suspect viral illness.  Troponin mildly elevated 0.22-0.29.  Cardiology Service is consulted, underwent cardiac catheterization showing severe distal LAD stenosis, moderate ostial large OM-1 stenosis.  Advised medical management.  Postoperative catheterization left-sided weakness.  MRI of the brain showed small volume patchy cortical and subcortical ischemic infarct within the left frontal and right frontoparietal region with additional tiny cortical infarcts within the right occipital lobe.  MRA of the head with no major vessel occlusion or stenosis.  Placed on aspirin therapy.  Subcutaneous heparin for DVT prophylaxis.  Physical and occupational therapy ongoing. The patient was admitted for a comprehensive rehab program  on August 29, 2015,  On August 31, 2015, developed some nonspecific chest pressure lasting perhaps an hour, resolved spontaneously.  Later, while up to the bathroom, had a syncopal event, noted asystole lasting approximately less than a minute.  Code blue was called.  Given chest compression for 2-3 minutes, regained pulse.  She did not recall the incident.  She was discharged to Acute Care Services for ongoing monitoring and workup.  Troponin 0.05.  Chest x-ray, questionable infiltrate in right lower lobe.  EKG, SVT with frequent PVCs, question vagal episode associated with micturition versus arrhythmia versus orthostasis.  Ongoing bouts of SVT, maintained on intravenous Cardizem, underwent EP study on September 05, 2015, unable to trigger arrhythmia, rate controlled, advised to continue Coreg. The patient was readmitted to continue therapies.  PAST MEDICAL HISTORY:  See discharge diagnoses.  SOCIAL HISTORY:  Lives with family.  Independent prior to admission.  FUNCTIONAL STATUS UPON ADMISSION TO REHAB SERVICES:  Moderate assist to ambulate 12 feet two person handheld assistance, moderate assist sit to stand, min to mod assist activities of daily living.  PHYSICAL EXAMINATION:  VITAL SIGNS:  Blood pressure 126/67, pulse 75, temperature 98, respirations 18. GENERAL:  This was an alert female, made good eye contact with examiner, followed full commands, fair awareness of deficits. LUNGS:  Clear to auscultation without wheeze. CARDIAC:  Regular rate and rhythm without murmur. LUNGS:  Clear to auscultation. ABDOMEN:  Soft, nontender.  Good bowel sounds.  REHABILITATION HOSPITAL  COURSE:  The patient was admitted to inpatient Rehab Services with therapies initiated on a 3-hour daily basis consisting of physical therapy, occupational therapy, and 24-hour rehab nursing.  The following issues were addressed during the patient's rehabilitation stay.  Pertaining to Julie Stein' watershed  pattern infarct after cardiac catheterization, remained stable, maintained on aspirin therapy.  She would follow up outpatient Neurology Services.  She remained on subcutaneous heparin for DVT prophylaxis, no bleeding episodes.  Aspirin therapy for coronary artery disease, followed by Cardiology Services.  Noted episode of SVT x1 while maintained on rehab. She did receive intravenous Cardizem, amiodarone which was adjusted, heart rate rebounded nicely into the 70s, no chest pain or shortness of breath.  She will continue Coreg 18.75 mg twice daily, amiodarone 200 mg twice daily adjusted accordingly per Cardiology Services, Cozaar, BiDil, as well as Aldactone and monitored very closely for any orthostatic changes.  The patient received weekly collaborative interdisciplinary team conferences.  She was ambulating extended distances to the gym with standby assistance without an assistive device navigating 6 steps up and down with mere cuing  She was able to gather belongings for activities of daily living and homemaking, ambulating at supervision level while in the ADL apartment.  Engaged in dynamic standing activities and dressing balance.  Full family teaching was completed and plan discharged to home with ongoing therapies dictated per [employer].  DISCHARGE MEDICATIONS: 1. Amiodarone 200 mg p.o. b.i.d. 2 weeks then daily 2. Aspirin 81 mg p.o. daily. 3. Lipitor 80 mg p.o. daily. 4. Coreg 18.75 mg p.o. b.i.d. 5. BiDil 20-37.5 mg p.o. t.i.d. 6. Cozaar 50 mg p.o. daily. 7. Nitroglycerin as needed. 8. Aldactone 25 mg p.o. daily.  DIET:  Low salt.  FOLLOWUP:  The patient will follow up with Dr. Claudette Laws at the outpatient rehab center Office to call for appointment; Dr. Shirlee Latch  2 weeks call for appointment; Dr. Roda Shutters of Neurology Services in 1 month and call for appointment; and [facility] on September 13, 2015.     Julie Stein,  P.A.   ______________________________ Erick Colace, M.D.    DA/MEDQ  D:  09/10/2015  T:  09/10/2015  Job:  [id number]  cc:   Dr. Smitty Pluck, MD

## 2015-09-10 NOTE — Progress Notes (Signed)
Occupational Therapy Discharge Summary  Patient Details  Name: Julie Stein MRN: [id number] Date of Birth: Sep 16, 1935  Patient has met 41 of 11 long term goals due to improved activity tolerance, improved balance, postural control, improved awareness and improved coordination.  Patient to discharge at overall Supervision level, Modified Independent with bathing and dressing.  Patient's care partner is independent to provide the necessary assistance at discharge.  Pt will have 24 hr supervision to provide with setup and assistance for higher level IADLs at home.  Reasons goals not met: NA  Recommendation:  Patient will benefit from ongoing skilled OT services in home health setting to continue to advance functional skills in the area of BADL and Reduce care partner burden.  Equipment: No equipment provided  Reasons for discharge: treatment goals met and discharge from hospital  Patient/family agrees with progress made and goals achieved: Yes  OT Discharge Precautions/Restrictions  Precautions Precautions: Fall Restrictions Weight Bearing Restrictions: No General   Vital Signs Therapy Vitals Temp: 98 F (36.7 C) Temp Source: Oral Pulse Rate: 79 Resp: 17 BP: (!) 151/86 mmHg Patient Position (if appropriate): Sitting Oxygen Therapy SpO2: 100 % O2 Device: Not Delivered Pain Pain Assessment Pain Assessment: No/denies pain Pain Score: 0-No pain ADL  See Function Navigator Vision/Perception  Vision- History Baseline Vision/History: Wears glasses Wears Glasses: Reading only Patient Visual Report: No change from baseline Vision- Assessment Vision Assessment?: Yes Eye Alignment: Within Functional Limits Ocular Range of Motion: Within Functional Limits Alignment/Gaze Preference: Within Defined Limits Perception Comments: WFL  Cognition Overall Cognitive Status: Within Functional Limits for tasks assessed Arousal/Alertness: Awake/alert Orientation Level: Oriented  X4 Attention: Sustained;Alternating Sustained Attention: Appears intact Alternating Attention: Appears intact Memory: Appears intact Awareness: Appears intact Safety/Judgment: Appears intact Sensation Sensation Light Touch: Appears Intact Stereognosis: Not tested Hot/Cold: Not tested Proprioception: Appears Intact Coordination Gross Motor Movements are Fluid and Coordinated: Yes Fine Motor Movements are Fluid and Coordinated: Yes Heel Shin Test: [facility]  Balance Standardized Balance Assessment Standardized Balance Assessment: Berg Balance Test;Dynamic Gait Index Berg Balance Test Sit to Stand: Able to stand  independently using hands Standing Unsupported: Able to stand safely 2 minutes Sitting with Back Unsupported but Feet Supported on Floor or Stool: Able to sit safely and securely 2 minutes Stand to Sit: Sits safely with minimal use of hands Transfers: Able to transfer safely, definite need of hands Standing Unsupported with Eyes Closed: Able to stand 10 seconds with supervision Standing Ubsupported with Feet Together: Able to place feet together independently and stand for 1 minute with supervision From Standing, Reach Forward with Outstretched Arm: Can reach forward >5 cm safely (2") From Standing Position, Pick up Object from Floor: Able to pick up shoe, needs supervision From Standing Position, Turn to Look Behind Over each Shoulder: Looks behind one side only/other side shows less weight shift Turn 360 Degrees: Able to turn 360 degrees safely but slowly Standing Unsupported, Alternately Place Feet on Step/Stool: Able to complete >2 steps/needs minimal assist Standing Unsupported, One Foot in Front: Able to take small step independently and hold 30 seconds Standing on One Leg: Tries to lift leg/unable to hold 3 seconds but remains standing independently Total Score: 38 Dynamic Gait Index Level Surface: Mild Impairment Change in Gait Speed: Mild Impairment Gait with  Horizontal Head Turns: Mild Impairment Gait with Vertical Head Turns: Mild Impairment Gait and Pivot Turn: Mild Impairment Step Over Obstacle: Moderate Impairment Step Around Obstacles: Mild Impairment Steps: Mild Impairment Total Score: 15 Extremity/Trunk Assessment RUE Assessment RUE  Assessment: Within Functional Limits LUE Assessment LUE Assessment: Within Functional Limits   See Function Navigator for Current Functional Status.  Simonne Come 09/10/2015, 3:37 PM

## 2015-09-10 NOTE — Progress Notes (Signed)
Physical Therapy Discharge Summary  Patient Details  Name: Julie Stein MRN: [id number] Date of Birth: [birth year]  Today's Date: 09/10/2015 PT Individual Time: 0900-1000 and 1400-1500 PT Individual Time Calculation (min): 60 min and 60 min (total 120 min)    Patient has met 10 of 10 long term goals due to improved activity tolerance, improved balance, improved postural control, increased strength, ability to compensate for deficits, functional use of  left upper extremity and left lower extremity, improved attention, improved awareness and improved coordination.  Patient to discharge at an ambulatory level Supervision.   Patient's care partner is independent to provide the necessary physical assistance at discharge.  Reasons goals not met: All goals met  Recommendation:  Patient will benefit from ongoing skilled PT services in home health setting to continue to advance safe functional mobility, address ongoing impairments in activity tolerance, balance, strength, and minimize fall risk.  Equipment: No equipment provided  Reasons for discharge: treatment goals met and discharge from hospital  Patient/family agrees with progress made and goals achieved: Yes  PT Discharge Precautions/Restrictions Precautions Precautions: Fall Restrictions Weight Bearing Restrictions: No Pain Pain Assessment Pain Assessment: No/denies pain Pain Score: 0-No pain Vision/Perception  Vision - Assessment Eye Alignment: Within Functional Limits Ocular Range of Motion: Within Functional Limits Alignment/Gaze Preference: Within Defined Limits Perception Comments: WFL  Cognition Overall Cognitive Status: Within Functional Limits for tasks assessed Arousal/Alertness: Awake/alert Orientation Level: Oriented X4 Attention: Sustained;Alternating Sustained Attention: Appears intact Alternating Attention: Appears intact Memory: Appears intact Awareness: Appears intact Safety/Judgment: Appears  intact Sensation Sensation Light Touch: Appears Intact Stereognosis: Not tested Hot/Cold: Not tested Proprioception: Appears Intact Coordination Gross Motor Movements are Fluid and Coordinated: Yes Fine Motor Movements are Fluid and Coordinated: Yes Heel Shin Test: [facility] Motor  Motor Motor: Within Functional Limits Motor - Discharge Observations: slow, hesitant movements   Mobility Bed Mobility Bed Mobility: Rolling Right;Rolling Left;Supine to Sit;Sit to Supine Rolling Right: 6: Modified independent (Device/Increase time) Rolling Left: 6: Modified independent (Device/Increase time) Supine to Sit: 6: Modified independent (Device/Increase time) Sit to Supine: 6: Modified independent (Device/Increase time) Transfers Transfers: Yes Stand Pivot Transfers: 5: Supervision Stand Pivot Transfer Details: Verbal cues for precautions/safety;Verbal cues for technique Locomotion  Ambulation Ambulation: Yes Ambulation/Gait Assistance: 5: Supervision Ambulation Distance (Feet): 240 Feet Ambulation/Gait Assistance Details: Verbal cues for precautions/safety;Verbal cues for technique Gait Gait: Yes Gait Pattern: Impaired Gait Pattern: Decreased stride length;Poor foot clearance - left;Poor foot clearance - right;Lateral hip instability;Trunk flexed Gait velocity: 0.55 m/sec High Level Ambulation High Level Ambulation: Head turns;Direction changes;Sudden stops Direction Changes: supervision, slow speed Sudden Stops: supervision Head Turns: supervision Stairs / Additional Locomotion Stairs: Yes Stairs Assistance: 5: Supervision Stair Management Technique: Two rails;Forwards;Step to pattern Number of Stairs: 12 Height of Stairs: 6 Wheelchair Mobility Wheelchair Mobility: No  Trunk/Postural Assessment  Cervical Assessment Cervical Assessment: Within Functional Limits Thoracic Assessment Thoracic Assessment: Within Functional Limits Lumbar Assessment Lumbar Assessment: Within  Functional Limits Postural Control Postural Control: Within Functional Limits  Balance Standardized Balance Assessment Standardized Balance Assessment: Berg Balance Test;Dynamic Gait Index Berg Balance Test Sit to Stand: Able to stand  independently using hands Standing Unsupported: Able to stand safely 2 minutes Sitting with Back Unsupported but Feet Supported on Floor or Stool: Able to sit safely and securely 2 minutes Stand to Sit: Sits safely with minimal use of hands Transfers: Able to transfer safely, definite need of hands Standing Unsupported with Eyes Closed: Able to stand 10 seconds with supervision Standing Ubsupported with Feet Together:  Able to place feet together independently and stand for 1 minute with supervision From Standing, Reach Forward with Outstretched Arm: Can reach forward >5 cm safely (2") From Standing Position, Pick up Object from Floor: Able to pick up shoe, needs supervision From Standing Position, Turn to Look Behind Over each Shoulder: Looks behind one side only/other side shows less weight shift Turn 360 Degrees: Able to turn 360 degrees safely but slowly Standing Unsupported, Alternately Place Feet on Step/Stool: Able to complete >2 steps/needs minimal assist Standing Unsupported, One Foot in Front: Able to take small step independently and hold 30 seconds Standing on One Leg: Tries to lift leg/unable to hold 3 seconds but remains standing independently Total Score: 38 Dynamic Gait Index Level Surface: Mild Impairment Change in Gait Speed: Mild Impairment Gait with Horizontal Head Turns: Mild Impairment Gait with Vertical Head Turns: Mild Impairment Gait and Pivot Turn: Mild Impairment Step Over Obstacle: Moderate Impairment Step Around Obstacles: Mild Impairment Steps: Mild Impairment Total Score: 15 Static Sitting Balance Static Sitting - Balance Support: Feet supported;No upper extremity supported Static Sitting - Level of Assistance: 7:  Independent Dynamic Sitting Balance Dynamic Sitting - Balance Support: No upper extremity supported;Feet supported Dynamic Sitting - Level of Assistance: 6: Modified independent (Device/Increase time) Dynamic Sitting - Balance Activities: Reaching for objects;Reaching across midline Static Standing Balance Static Standing - Balance Support: No upper extremity supported Static Standing - Level of Assistance: 5: Stand by assistance Static Standing - Comment/# of Minutes: x2 min in Berg Static Stance: Eyes closed Static Stance: Eyes Closed: x30 sec supervision Dynamic Standing Balance Dynamic Standing - Balance Support: No upper extremity supported;During functional activity Dynamic Standing - Level of Assistance: 5: Stand by assistance Dynamic Standing - Balance Activities: Reaching across midline;Reaching for objects Extremity Assessment  RUE Assessment RUE Assessment: Within Functional Limits LUE Assessment LUE Assessment: Within Functional Limits RLE Assessment RLE Assessment: Within Functional Limits LLE Assessment LLE Assessment: Within Functional Limits   Skilled Therapeutic Intervnetion: 0900-1000: Pt received seated in recliner; no c/o pain and agreeable to treatment. Vitals monitored throughout session; BP and HR remained WNL. Gait with supervision x125' and min cues for upright posture, scapular retraction. Stairs performed in gym on set of 4 6-inch stairs, 3 reps prior to rest break for 12 stairs total; 2 handrails and supervision. Car transfer performed with supervision. Bed and furniture transfers in simulated apartment with supervision. Educated pt and family on floor transfers, safety after a fall. Performed floor transfer to mat with supervision, min cues for problem solving to get up from floor using furniture. Gait for 3 trials with seated rest breaks in between for 100', 240', and 1' with supervision and no LOB. Pt returned to room and remained seated in recliner with  family present and all needs within reach.  1400-1500: Pt received seated in recliner; no c/o pain and agreeable to treatment. Community ambulation x10 min with seated rest break at completion; included crowded environments, cognitive dual task, variable surfaces all performed with supervision. Berg Balance Scale and Dynamic Gait Index assessed as above. Evaluated strength/sensation as above in preparation for d/c. Stair training x1 trial of 12 stairs in stairwell to simulate community environment, supervision overall and min cues for hand placement. Gait to return to room x80' with supervision. Educated pt and family in follow-up services, home safety. Pt remained seated on EOB with family present at completion of session, all needs within reach.   See Function Navigator for Current Functional Status.  Benjiman Core  Tygielski 09/10/2015, 4:11 PM

## 2015-09-10 NOTE — Discharge Instructions (Signed)
Inpatient Rehab Discharge Instructions  Julie Stein Discharge date and time: No discharge date for patient encounter.   Activities/Precautions/ Functional Status: Activity: activity as tolerated Diet: cardiac diet Wound Care: none needed Functional status:  ___ No restrictions     ___ Walk up steps independently ___ 24/7 supervision/assistance   ___ Walk up steps with assistance ___ Intermittent supervision/assistance  ___ Bathe/dress independently ___ Walk with walker     ___ Bathe/dress with assistance ___ Walk Independently    ___ Shower independently _x STROKE/TIA DISCHARGE INSTRUCTIONS SMOKING Cigarette smoking nearly doubles your risk of having a stroke & is the single most alterable risk factor  If you smoke or have smoked in the last 12 months, you are advised to quit smoking for your health.  Most of the excess cardiovascular risk related to smoking disappears within a year of stopping.  Ask you doctor about anti-smoking medications  [place] Quit Line: 1-800-QUIT NOW  Free Smoking Cessation Classes [phone]  CHOLESTEROL Know your levels; limit fat & cholesterol in your diet  Lipid Panel     Component Value Date/Time   CHOL 267* 08/27/2015 0600   TRIG 46 08/27/2015 0600   HDL 106 08/27/2015 0600   CHOLHDL 2.5 08/27/2015 0600   VLDL 9 08/27/2015 0600   LDLCALC 152* 08/27/2015 0600      Many patients benefit from treatment even if their cholesterol is at goal.  Goal: Total Cholesterol (CHOL) less than 160  Goal:  Triglycerides (TRIG) less than 150  Goal:  HDL greater than 40  Goal:  LDL (LDLCALC) less than 100   BLOOD PRESSURE American Stroke Association blood pressure target is less that 120/80 mm/Hg  Your discharge blood pressure is:  BP: (!) 161/80 mmHg  Monitor your blood pressure  Limit your salt and alcohol intake  Many individuals will require more than one medication for high blood pressure  DIABETES (A1c is a blood sugar average for last 3 months)  Goal HGBA1c is under 7% (HBGA1c is blood sugar average for last 3 months)  Diabetes: No known diagnosis of diabetes    Lab Results  Component Value Date   HGBA1C 6.2* 08/27/2015     Your HGBA1c can be lowered with medications, healthy diet, and exercise.  Check your blood sugar as directed by your physician  Call your physician if you experience unexplained or low blood sugars.  PHYSICAL ACTIVITY/REHABILITATION Goal is 30 minutes at least 4 days per week  Activity: Increase activity slowly, Therapies: Physical Therapy: Home Health Return to work:   Activity decreases your risk of heart attack and stroke and makes your heart stronger.  It helps control your weight and blood pressure; helps you relax and can improve your mood.  Participate in a regular exercise program.  Talk with your doctor about the best form of exercise for you (dancing, walking, swimming, cycling).  DIET/WEIGHT Goal is to maintain a healthy weight  Your discharge diet is: Diet 2 gram sodium Room service appropriate?: Yes; Fluid consistency:: Thin  liquids Your height is:  Height: 4\' 11"  (149.9 cm) Your current weight is: Weight: 50.3 kg (110 lb 14.3 oz) Your Body Mass Index (BMI) is:  BMI (Calculated): 22.4  Following the type of diet specifically designed for you will help prevent another stroke.  Your goal weight range is:    Your goal Body Mass Index (BMI) is 19-24.  Healthy food habits can help reduce 3 risk factors for stroke:  High cholesterol, hypertension, and excess weight.  RESOURCES Stroke/Support Group:  Call [phone]   STROKE EDUCATION PROVIDED/REVIEWED AND GIVEN TO PATIENT Stroke warning signs and symptoms How to activate emergency medical system (call 911). Medications prescribed at discharge. Need for follow-up after discharge. Personal risk factors for stroke. Pneumonia vaccine given:  Flu vaccine given:  My questions have been answered, the writing is legible, and I understand  these instructions.  I will adhere to these goals & educational materials that have been provided to me after my discharge from the hospital.    __ Walk with assistance    ___ Shower with assistance ___ No alcohol     ___ Return to work/school ________  Special Instructions:    COMMUNITY REFERRALS UPON DISCHARGE:    Home Health:   PT, OT, RN     Agency:[facility] Phone:[phone]   Date of last service:09/11/2015  Medical Equipment/Items Ordered:NO NEEDS   Other:[facility] APPOINTMENT MADE FOR FOLLOW UP PCP MEDICAID APPLICATION PENDING  GENERAL COMMUNITY RESOURCES FOR PATIENT/FAMILY: Support Groups:[employer] [facility]PPORT GROUP  My questions have been answered and I understand these instructions. I will adhere to these goals and the provided educational materials after my discharge from the hospital.  Patient/Caregiver Signature _______________________________ Date __________  Clinician Signature _______________________________________ Date __________  Please bring this form and your medication list with you to all your follow-up doctor's appointments.

## 2015-09-10 NOTE — Progress Notes (Signed)
[facility] PHYSICAL MEDICINE & REHABILITATION     PROGRESS NOTE    Subjective/Complaints: .  No issues overnite Daughter in room   ROS: Denies CP, SOB, N/B/D  Objective: Vital Signs: Blood pressure 143/76, pulse 73, temperature 98.4 F (36.9 C), temperature source Oral, resp. rate 18, height 4\' 11"  (1.499 m), weight 50.3 kg (110 lb 14.3 oz), SpO2 100 %. No results found. No results for input(s): WBC, HGB, HCT, PLT in the last 72 hours.  Recent Labs  09/08/15 0233  NA 135  K 3.7  CL 102  GLUCOSE 105*  BUN 9  CREATININE 1.19*  CALCIUM 9.4   CBG (last 3)  No results for input(s): GLUCAP in the last 72 hours.  Wt Readings from Last 3 Encounters:  09/06/15 50.3 kg (110 lb 14.3 oz)  09/06/15 48.172 kg (106 lb 3.2 oz)  08/26/15 49.6 kg (109 lb 5.6 oz)    Physical Exam:  BP 143/76 mmHg  Pulse 73  Temp(Src) 98.4 F (36.9 C) (Oral)  Resp 18  Ht 4\' 11"  (1.499 m)  Wt 50.3 kg (110 lb 14.3 oz)  BMI 22.39 kg/m2  SpO2 100% Constitutional: NAD. She appears well-developed and well-nourished.  HENT:  Head: Normocephalic and atraumatic.  Eyes: Conjunctivae and EOM are normal.  Neck: Normal range of motion. Neck supple. No thyromegaly present.  Cardiovascular: Normal rate, regular rhythm.  Respiratory: Effort normal and breath sounds normal. No respiratory distress.  GI: Soft. Bowel sounds are normal. She exhibits no distension. There is no tenderness.  Musculoskeletal: She exhibits no edema.  Antigravity strength throughout Neurological: She is alert and oriented.   Follows full commands. Fair awareness of deficits Left trunk lean Skin: Skin is warm and dry.  Psychiatric: She has a normal mood and affect. Her behavior is normal   Assessment/Plan: 1. 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 Physiatrist is providing close team  supervision and 24 hour management of active medical problems listed below. Physiatrist and rehab team continue to assess barriers to discharge/monitor patient progress toward functional and medical goals.  Function:  Bathing Bathing position   Position: Standing at sink  Bathing parts Body parts bathed by patient: Right arm, Left arm, Chest, Abdomen, Right upper leg, Left upper leg, Right lower leg, Left lower leg, Buttocks, Front perineal area Body parts bathed by helper: Back  Bathing assist Assist Level: Touching or steadying assistance(Pt > 75%)      Upper Body Dressing/Undressing Upper body dressing   What is the patient wearing?: Bra, Pull over shirt/dress Bra - Perfomed by patient: Thread/unthread right bra strap, Thread/unthread left bra strap, Hook/unhook bra (pull down sports bra)   Pull over shirt/dress - Perfomed by patient: Thread/unthread right sleeve, Thread/unthread left sleeve, Put head through opening, Pull shirt over trunk          Upper body assist Assist Level: Set up   Set up : To obtain clothing/put away  Lower Body Dressing/Undressing Lower body dressing   What is the patient wearing?: Underwear, Pants, Socks, Shoes Underwear - Performed by patient: Thread/unthread right underwear leg, Thread/unthread left underwear leg, Pull underwear up/down   Pants- Performed by patient: Thread/unthread right pants leg, Thread/unthread left pants leg, Pull pants up/down       Socks - Performed by patient: Don/doff right sock, Don/doff left sock   Shoes - Performed by patient: Don/doff right shoe, Don/doff  left shoe, Fasten right, Fasten left            Lower body assist Assist for lower body dressing: Set up, Touching or steadying assistance (Pt > 75%)   Set up : To obtain clothing/put away  Toileting Toileting   Toileting steps completed by patient: Adjust clothing prior to toileting, Performs perineal hygiene, Adjust clothing after toileting       Toileting assist     Transfers Chair/bed transfer Chair/bed transfer activity did not occur: Safety/medical concerns Chair/bed transfer method: Stand pivot Chair/bed transfer assist level: Supervision or verbal cues Chair/bed transfer assistive device: Armrests     Locomotion Ambulation Ambulation activity did not occur: Safety/medical concerns   Max distance: 100 Assist level: Touching or steadying assistance (Pt > 75%)   Wheelchair   Type: Manual Max wheelchair distance: 120 Assist Level: Supervision or verbal cues  Cognition Comprehension Comprehension assist level: Follows basic conversation/direction with no assist  Expression Expression assist level: Expresses basic needs/ideas: With extra time/assistive device  Social Interaction Social Interaction assist level: Interacts appropriately 90% of the time - Needs monitoring or encouragement for participation or interaction.  Problem Solving Problem solving assist level: Solves basic 90% of the time/requires cueing < 10% of the time  Memory Memory assist level: Complete Independence: No helper    Medical Problem List and Plan: 1. Functional deficits secondary to watershed pattern infarct likely due to hypoperfusion after initial cardiac catheterization, min A level for ADL and mobility, team conf this week 2. DVT Prophylaxis/Anticoagulation: Subcutaneous heparin. Monitor platelet counts in any signs of bleeding, will stop this upon D/C 3. Pain Management: Tylenol as needed 4. CAD/syncope. Continue aspirin therapy 5. Neuropsych: This patient is capable of making decisions on her own behalf. 6. Skin/Wound Care: Routine skin checks 7. Fluids/Electrolytes/Nutrition: Routine I&O with follow-up chemistries  Labs reviewed on 10/22, stable, may d/c IV 8. Hypertension/SVT. EP study 09/05/2015 unable to trigger arrhythmia. Coreg increased to 18.75 BID on 10/21, Bidil 20-37.5, 1/2 tab mg 3 times a day, Cozaar 25 mg daily, Aldactone  increased to  10/21. Amio 200 BID added Monitor with increased mobility  - HR 73 this AM.    -BP 143/76 this AM, acceptable range 9. Hyperlipidemia. Lipitor  LOS (Days) 4 A FACE TO FACE EVALUATION WAS PERFORMED  Erick Colace 09/10/2015 6:52 AM

## 2015-09-10 NOTE — Discharge Summary (Signed)
Discharge summary job # [id number]

## 2015-09-10 NOTE — Progress Notes (Signed)
Occupational Therapy Session Note  Patient Details  Name: Julie Stein MRN: [id number] Date of Birth: 10-07-1935  Today's Date: 09/10/2015 OT Individual Time: 3267-1245 OT Individual Time Calculation (min): 60 min    Short Term Goals: Week 1:  OT Short Term Goal 1 (Week 1): STG = LTGs due to ELOS  Skilled Therapeutic Interventions/Progress Updates:    Treatment session with focus on activity tolerance during ADLs and IADLs.  Pt received upright finishing breakfast, reporting already completing bathing and dressing prior to session.  Pt reports daughter present and assisted with initial setup of items and supervision during bathing and dressing at sit > stand level.  Ambulated approx 100 feet without AD and supervision to ADL apt.  Engaged in functional mobility over various surfaces and completed furniture transfers all with supervision.  Simple meal prep in kitchen with supervision, educated on energy conservation strategies.  Engaged in dynamic standing activity addressing balance with reaching outside BOS and down to floor to obtain items, progressing to standing on compliant surface to increase balance challenge.  Close supervision with stooping to retrieve items from floor and when standing on compliant surface.  Monitored vitals throughout session with HR WNLs.  Therapy Documentation Precautions:  Precautions Precautions: Fall Restrictions Weight Bearing Restrictions: No General:   Vital Signs: Therapy Vitals Temp: 98.4 F (36.9 C) Temp Source: Oral Pulse Rate: 71 Resp: 18 BP: (!) 161/80 mmHg Patient Position (if appropriate): Sitting Oxygen Therapy SpO2: 100 % O2 Device: Not Delivered Pain:  Pt with no c/o pain  See Function Navigator for Current Functional Status.   Therapy/Group: Individual Therapy  Rosalio Loud 09/10/2015, 8:46 AM

## 2015-09-10 NOTE — Progress Notes (Signed)
Social Work Patient ID: Julie Stein, female   DOB: January 18, 1935, 80 y.o.   MRN: [id number] Team feels pt ready for discharge tomorrow and MD in agreement with.  Pt is very pleased and very happy to be going home. She will have someone with her and feels ready to go home.

## 2015-09-10 NOTE — Progress Notes (Signed)
Patient ID: Julie Stein, female   DOB: 1935/09/17, 80 y.o.   MRN: [id number]   SUBJECTIVE:  Julie Stein did well over the weekend, no further SVT.  Now on amiodarone.  SBP running 130s-140s, will increase losartan to 50 mg daily.      Scheduled Meds: . amiodarone  200 mg Oral BID  . aspirin EC  81 mg Oral Daily  . atorvastatin  80 mg Oral q1800  . carvedilol  18.75 mg Oral BID WC  . heparin subcutaneous  5,000 Units Subcutaneous 3 times per day  . isosorbide-hydrALAZINE  0.5 tablet Oral TID  . losartan  25 mg Oral Daily  . spironolactone  25 mg Oral Daily   Continuous Infusions:   PRN Meds:.acetaminophen, nitroGLYCERIN, ondansetron **OR** ondansetron (ZOFRAN) IV, sorbitol    Filed Vitals:   09/08/15 2036 09/09/15 0636 09/09/15 1327 09/10/15 0617  BP: 131/87 137/75 113/61 143/76  Pulse: 77 73 79 73  Temp:  98.3 F (36.8 C) 98.6 F (37 C) 98.4 F (36.9 C)  TempSrc:  Oral Oral Oral  Resp:  Height:      Weight:      SpO2: 96% 100% 100% 100%    Intake/Output Summary (Last 24 hours) at 09/10/15 5621 Last data filed at 09/10/15 0730  Gross per 24 hour  Intake   1080 ml  Output      0 ml  Net   1080 ml    LABS: Basic Metabolic Panel:  Recent Labs  30/86/57 0233  NA 135  K 3.7  CL 102  CO2 26  GLUCOSE 105*  BUN 9  CREATININE 1.19*  CALCIUM 9.4   Liver Function Tests: No results for input(s): AST, ALT, ALKPHOS, BILITOT, PROT, ALBUMIN in the last 72 hours. No results for input(s): LIPASE, AMYLASE in the last 72 hours. CBC: No results for input(s): WBC, NEUTROABS, HGB, HCT, MCV, PLT in the last 72 hours. Cardiac Enzymes: No results for input(s): CKTOTAL, CKMB, CKMBINDEX, TROPONINI in the last 72 hours. BNP: Invalid input(s): POCBNP D-Dimer: No results for input(s): DDIMER in the last 72 hours. Hemoglobin A1C: No results for input(s): HGBA1C in the last 72 hours. Fasting Lipid Panel: No results for input(s): CHOL, HDL, LDLCALC, TRIG, CHOLHDL,  LDLDIRECT in the last 72 hours. Thyroid Function Tests: No results for input(s): TSH, T4TOTAL, T3FREE, THYROIDAB in the last 72 hours.  Invalid input(s): FREET3 Anemia Panel: No results for input(s): VITAMINB12, FOLATE, FERRITIN, TIBC, IRON, RETICCTPCT in the last 72 hours.  RADIOLOGY: Dg Chest 2 View  08/31/2015  CLINICAL DATA:  Dyspnea. EXAM: CHEST  2 VIEW COMPARISON:  August 31, 2015. FINDINGS: Stable cardiomediastinal silhouette. No pneumothorax is noted. Stable right basilar opacity is noted concerning for pneumonia or atelectasis with associated pleural effusion. Mild left basilar subsegmental atelectasis is noted. Bony thorax is unremarkable. IMPRESSION: Stable right basilar opacity is noted concerning for pneumonia or atelectasis with associated pleural effusion. Mild left basilar subsegmental atelectasis is noted as well. Electronically Signed   By: Julie Stein, M.D.   On: 08/31/2015 11:00   Ct Head Wo Contrast  08/27/2015  CLINICAL DATA:  Initial evaluation for acute unresponsiveness. , left-sided weakness, now resolved. EXAM: CT HEAD WITHOUT CONTRAST TECHNIQUE: Contiguous axial images were obtained from the base of the skull through the vertex without intravenous contrast. COMPARISON:  None. FINDINGS: Age-related cerebral volume loss present. Patchy and confluent hypodensity within the periventricular and deep white matter both cerebral hemispheres most consistent with  chronic small vessel ischemic disease. Scattered vascular calcifications within the carotid siphons and distal right vertebral artery. No acute large vessel territory infarct. Gray-white matter differentiation grossly maintained. Deep gray nuclei are symmetric. No acute intracranial hemorrhage. No mass lesion, midline shift, or mass effect. No hydrocephalus. No extra-axial fluid collection. Scalp soft tissues within normal limits. No acute abnormality about the orbits. Paranasal sinuses mastoid air cells are clear.  Calvarium intact. IMPRESSION: 1. No acute intracranial process. 2. Age-related cerebral atrophy with chronic small vessel ischemic disease. Electronically Signed   By: Rise Mu M.D.   On: 08/27/2015 02:31   Mr Julie Stein Wo Contrast  08/28/2015  CLINICAL DATA:  Initial evaluation for transient left-sided weakness following an episode of unresponsiveness and hypotension. EXAM: MRI HEAD WITHOUT CONTRAST MRA HEAD WITHOUT CONTRAST TECHNIQUE: Multiplanar, multiecho pulse sequences of the brain and surrounding structures were obtained without intravenous contrast. Angiographic images of the head were obtained using MRA technique without contrast. COMPARISON:  Prior CT from 08/27/2015. FINDINGS: MRI HEAD FINDINGS Diffuse prominence of the CSF containing spaces is compatible with generalized age-related cerebral atrophy. Patchy and confluent T2/FLAIR hyperintensity within the periventricular and deep white matter both cerebral hemispheres most consistent with chronic small vessel ischemic disease. Small remote lacunar infarct within the periventricular white matter of the left corona radiata. There is a few small foci of patchy restricted diffusion within the high left frontal lobe and right frontoparietal regions bilaterally (series 4, image 33, 35). Additional tiny punctate cortical infarct more inferiorly within the right occipital lobe (series 4, image 26). Associated signal loss seen on corresponding ADC map. No associated hemorrhage or mass effect. Bodies consistent with small acute ischemic infarcts. Given the distribution of these findings, underlying watershed etiology is favored. Normal intravascular flow voids are maintained. Additional scattered subcentimeter foci of susceptibility artifact seen in within the peripheral cortices of the bilateral cerebral hemispheres on gradient echo sequence, consistent with small chronic micro hemorrhages. While these could potentially be related underlying  hypertension, possible amyloid angiopathy could be considered given their somewhat peripheral distribution. No mass lesion, midline shift, or mass effect. No hydrocephalus. No extra-axial fluid collection. Craniocervical junction within normal limits. Mild degenerative spondylolysis noted within the visualized upper cervical spine. Pituitary gland normal. Thinning of the anterior body of the corpus callosum noted, which may related to remote ischemia. No acute abnormality about the orbits. Mild right-sided exophthalmos. Paranasal sinuses are clear. No mastoid effusion. Inner ear structures within normal limits. Bone marrow signal intensity within normal limits. No scalp soft tissue abnormality. MRA HEAD FINDINGS ANTERIOR CIRCULATION: Visualized distal cervical segments of the internal carotid arteries are patent with antegrade flow. Petrous segments widely patent. Scattered multi focal atheromatous irregularity within the cavernous and supraclinoid left ICA. There is a short-segment moderate stenosis at the proximal supraclinoid left ICA (series 5, image 72). Cavernous and supraclinoid right ICA widely patent. Right A1 segment widely patent. Left A1 segment slightly hypoplastic. Anterior communicating artery patent. Atheromatous irregularity within the anterior cerebral arteries bilaterally. M1 segments widely patent without stenosis or occlusion. Atheromatous irregularity within the left M1 segment. MCA bifurcations within normal limits. MCA branches symmetric bilaterally. Distal small vessel disease present within the MCA branches bilaterally. POSTERIOR CIRCULATION: Vertebral arteries patent to the vertebrobasilar junction. Right vertebral artery slightly diminutive. Posterior inferior cerebral arteries not well evaluated on this exam. Basilar artery widely patent. Small left anterior inferior cerebral artery noted. Superior cerebellar arteries patent. Both sub posterior cerebral arteries arise from the basilar  artery and are well opacified to their distal aspects. Distal small vessel disease within the PCA branches bilaterally. There is a more focal short-segment moderate stenosis within the proximal right P2 segment (series 505, image 15). No aneurysm or vascular malformation. IMPRESSION: MRI HEAD IMPRESSION: 1. Small volume patchy cortical and subcortical ischemic infarcts within the left frontal and right frontoparietal region, with additional tiny cortical infarct within the right occipital lobe. Given the distribution of these infarcts, an underlying watershed etiology is favored. No associated hemorrhage or mass effect. 2. Scattered small chronic micro hemorrhages as above. While these might be related to chronic underlying hypertension, possible amyloid angiopathy could also be considered given their somewhat peripheral distribution. 3. Atrophy with chronic microvascular ischemic disease. MRA HEAD IMPRESSION: 1. No large vessel or proximal arterial branch occlusion identified. 2. Short-segment moderate stenosis within the supraclinoid left ICA. 3. Short-segment moderate stenosis within the proximal right P2 segment. 4. Distal small vessel atheromatous disease within the MCA, ACA, and PCA branches bilaterally Electronically Signed   By: Rise Mu M.D.   On: 08/28/2015 02:26   Mr Brain Wo Contrast  08/28/2015  CLINICAL DATA:  Initial evaluation for transient left-sided weakness following an episode of unresponsiveness and hypotension. EXAM: MRI HEAD WITHOUT CONTRAST MRA HEAD WITHOUT CONTRAST TECHNIQUE: Multiplanar, multiecho pulse sequences of the brain and surrounding structures were obtained without intravenous contrast. Angiographic images of the head were obtained using MRA technique without contrast. COMPARISON:  Prior CT from 08/27/2015. FINDINGS: MRI HEAD FINDINGS Diffuse prominence of the CSF containing spaces is compatible with generalized age-related cerebral atrophy. Patchy and confluent  T2/FLAIR hyperintensity within the periventricular and deep white matter both cerebral hemispheres most consistent with chronic small vessel ischemic disease. Small remote lacunar infarct within the periventricular white matter of the left corona radiata. There is a few small foci of patchy restricted diffusion within the high left frontal lobe and right frontoparietal regions bilaterally (series 4, image 33, 35). Additional tiny punctate cortical infarct more inferiorly within the right occipital lobe (series 4, image 26). Associated signal loss seen on corresponding ADC map. No associated hemorrhage or mass effect. Bodies consistent with small acute ischemic infarcts. Given the distribution of these findings, underlying watershed etiology is favored. Normal intravascular flow voids are maintained. Additional scattered subcentimeter foci of susceptibility artifact seen in within the peripheral cortices of the bilateral cerebral hemispheres on gradient echo sequence, consistent with small chronic micro hemorrhages. While these could potentially be related underlying hypertension, possible amyloid angiopathy could be considered given their somewhat peripheral distribution. No mass lesion, midline shift, or mass effect. No hydrocephalus. No extra-axial fluid collection. Craniocervical junction within normal limits. Mild degenerative spondylolysis noted within the visualized upper cervical spine. Pituitary gland normal. Thinning of the anterior body of the corpus callosum noted, which may related to remote ischemia. No acute abnormality about the orbits. Mild right-sided exophthalmos. Paranasal sinuses are clear. No mastoid effusion. Inner ear structures within normal limits. Bone marrow signal intensity within normal limits. No scalp soft tissue abnormality. MRA HEAD FINDINGS ANTERIOR CIRCULATION: Visualized distal cervical segments of the internal carotid arteries are patent with antegrade flow. Petrous segments  widely patent. Scattered multi focal atheromatous irregularity within the cavernous and supraclinoid left ICA. There is a short-segment moderate stenosis at the proximal supraclinoid left ICA (series 5, image 72). Cavernous and supraclinoid right ICA widely patent. Right A1 segment widely patent. Left A1 segment slightly hypoplastic. Anterior communicating artery patent. Atheromatous irregularity within the anterior cerebral arteries bilaterally.  M1 segments widely patent without stenosis or occlusion. Atheromatous irregularity within the left M1 segment. MCA bifurcations within normal limits. MCA branches symmetric bilaterally. Distal small vessel disease present within the MCA branches bilaterally. POSTERIOR CIRCULATION: Vertebral arteries patent to the vertebrobasilar junction. Right vertebral artery slightly diminutive. Posterior inferior cerebral arteries not well evaluated on this exam. Basilar artery widely patent. Small left anterior inferior cerebral artery noted. Superior cerebellar arteries patent. Both sub posterior cerebral arteries arise from the basilar artery and are well opacified to their distal aspects. Distal small vessel disease within the PCA branches bilaterally. There is a more focal short-segment moderate stenosis within the proximal right P2 segment (series 505, image 15). No aneurysm or vascular malformation. IMPRESSION: MRI HEAD IMPRESSION: 1. Small volume patchy cortical and subcortical ischemic infarcts within the left frontal and right frontoparietal region, with additional tiny cortical infarct within the right occipital lobe. Given the distribution of these infarcts, an underlying watershed etiology is favored. No associated hemorrhage or mass effect. 2. Scattered small chronic micro hemorrhages as above. While these might be related to chronic underlying hypertension, possible amyloid angiopathy could also be considered given their somewhat peripheral distribution. 3. Atrophy with  chronic microvascular ischemic disease. MRA HEAD IMPRESSION: 1. No large vessel or proximal arterial branch occlusion identified. 2. Short-segment moderate stenosis within the supraclinoid left ICA. 3. Short-segment moderate stenosis within the proximal right P2 segment. 4. Distal small vessel atheromatous disease within the MCA, ACA, and PCA branches bilaterally Electronically Signed   By: Rise Mu M.D.   On: 08/28/2015 02:26   Dg Chest Right Decubitus  08/31/2015  CLINICAL DATA:  Dyspnea. EXAM: CHEST - RIGHT DECUBITUS COMPARISON:  Same day. FINDINGS: Right lateral decubitus view of the chest demonstrates mild free flowing right pleural effusion. IMPRESSION: Mild free flowing right pleural effusion. Electronically Signed   By: Julie Stein, M.D.   On: 08/31/2015 11:27   Dg Chest Port 1 View  08/31/2015  CLINICAL DATA:  Chest pain beginning after physical therapy yesterday. EXAM: PORTABLE CHEST 1 VIEW COMPARISON:  08/28/2015 FINDINGS: Borderline heart size without vascular congestion. Infiltration in the right lung base obscuring the right hemidiaphragm. Probable small right pleural effusion. Appearance suggest pneumonia. Atelectasis in the left lung base is improved since previous study. No pneumothorax. Calcified and tortuous aorta. Mild thoracic scoliosis convex towards the right. IMPRESSION: Infiltration in the right lung base with probable small right pleural effusion. Findings suggest pneumonia. Electronically Signed   By: Burman Nieves M.D.   On: 08/31/2015 03:21   Dg Chest Port 1 View  08/28/2015  CLINICAL DATA:  Shortness of breath.  Pulmonary edema. EXAM: PORTABLE CHEST 1 VIEW COMPARISON:  08/26/2015. FINDINGS: Left IJ line in stable position. Cardiomegaly with bilateral pulmonary alveolar infiltrates. Slight improvement. Persistent small pleural effusions. No pneumothorax. IMPRESSION: 1. Left IJ line in stable position. 2. Cardiomegaly with persistent bilateral pulmonary  infiltrates, partial clearing from prior exam. Persistent small pleural effusions. Electronically Signed   By: Maisie Fus  Register   On: 08/28/2015 07:18   Dg Chest Port 1 View  08/26/2015  CLINICAL DATA:  80 year old female central line placement. Initial encounter. EXAM: PORTABLE CHEST 1 VIEW COMPARISON:  1306 hours today. FINDINGS: Portable AP semi upright view at 2259 hours. Left IJ central line has been placed. Tip is at the lower SVC level. No pneumothorax. Stable cardiac size and mediastinal contours. Interval mild regression of widespread patchy in perihilar opacity, improvement is greater in the upper lobes. Residual in  the lower lobes. No large pleural effusion. IMPRESSION: 1. Left IJ central line placed, tip at the lower SVC level with no adverse features. 2. Some interval regression of pulmonary edema, residual in the lower lungs. Electronically Signed   By: Odessa Fleming M.D.   On: 08/26/2015 23:06   Dg Chest Portable 1 View  08/26/2015  CLINICAL DATA:  Shortness of breath. EXAM: PORTABLE CHEST 1 VIEW COMPARISON:  None. FINDINGS: Borderline cardiomegaly is noted. No pneumothorax or significant pleural effusion is noted. Diffuse patchy airspace opacities are noted throughout both lungs concerning for pneumonia or edema. Bony thorax is unremarkable. IMPRESSION: Diffuse patchy bilateral airspace opacities are noted concerning for pneumonia or edema. Electronically Signed   By: Julie Stein, M.D.   On: 08/26/2015 13:24    PHYSICAL EXAM General: NAD. In Bed.  Neck: JVP 7 cm, no thyromegaly or thyroid nodule.  Lungs: Clear to auscultation bilaterally with normal respiratory effort. CV: Nondisplaced PMI.  Heart regular S1/S2 with S3 gallop, no murmur.  No peripheral edema.   Abdomen: Soft, nontender, no hepatosplenomegaly, no distention.  Neurologic: Alert and oriented x 3.  Psych: Normal affect. Extremities: No clubbing or cyanosis.   TELEMETRY: NSR   ASSESSMENT AND PLAN: Ms Dougan was  readmitted 10/14 after syncopal episode requiring brief CPR.  1. Syncope: Occurred while on bedside commode. Required brief CPR. No meds required. ? Vagal episode associated with micturation versus arrhythmia.  Also with soft BP, we cut back her cardiac meds.  Concerned now regarding recurrent episodes of SVT with rate around 180 which may have been culprit => unfortunately, she was not on telemetry when event occurred.  - She has not been orthostatic when measured. - We cut back on her cardiac meds, BP has been stable with this change but now running higher.  - She had EP study  => unable to trigger an arrhythmia.   - With Rapid SVT in setting of cardiomyopathy as possible culprit of syncope, think I would hold off on Lifevest.  2. Chronic Systolic HF: Primarily nonischemic cardiomyopathy. ECHO 10/9 EF 20-25% with LVH. Volume status ok. No diuretics.  - Increase losartan to 50 mg daily.  - Continue current doses of Coreg, spironolactone, and Bidil.      3. CAD: LHC 10/10 with 20% proximal LAD, 40% mid LAD, 90% distal, large high OM1 with 80% ostial stenosis. Medically managed. Troponin normal.  - Contine ASA and statin.  4. CVA: MRI head last admission showed small infarcts in watershed region. She has left leg weakness that pre-dated her cardiac cath, ?related to uncontrolled HTN.  5. H/o SVT: Required amiodarone briefly last admission. 10/16 back in SVT and briefly on diltiazem gtt with conversion to NSR.  No further SVT.  ?rapid SVT in setting of low EF as contributor to syncopal event (along with iatrogenic low BP). Had EP study, unable to trigger arrhythmia.  Recurrent SVT last Friday, now on amiodarone and no SVT over weekend.  - Continue Coreg 18.75 mg bid.  - Continue amiodarone 200 mg bid x 2 weeks total then 200 mg daily.   6. HTN: BP running a bit high still, will increase losartan to 50 mg daily.  CMET tomorrow. Renal artery dopplers did not show RAS.  7. Pulmonary:  Persistent right-sided infiltrate on CXR, decreased BS right base. Does not appear changed from prior. Normal WBCs, no fever. Had course of azithromycin. ?atelectasis with small effusion.   8. Needs ongoing PT/OT.  9. DVT prophylaxis:  Subcutaneous heparin.   Marca Ancona 09/10/2015 8:22 AM

## 2015-09-10 NOTE — Progress Notes (Signed)
Social Work Patient ID: Julie Stein, female   DOB: 1935-01-19, 80 y.o.   MRN: [id number] Discussed follow up therapies, her Medicare only covers home health, since she does not have Medicare part B.  She is in agreement with this. Will make referral to [facility] since no preference.

## 2015-09-10 NOTE — Progress Notes (Signed)
Pt discharged home with family. Discharge instructions provided by Harvel Ricks, PA. All questions answered, pt family verbalized understanding. Pt escorted off unit in w/c with personal belonging by Lelon Mast, NT

## 2015-09-10 NOTE — Progress Notes (Signed)
Patient information reviewed and entered into [facility] by Becky Mariachristina Holle, covering PPS coordinator. Information including medical coding and functional independence measure will be reviewed and updated through discharge.  Per nursing, patient was given "Data Collection Information Summary for Patients in Inpatient Rehabilitation Facilities with attached Privacy Act Statement Health Care Records" upon admission.     

## 2015-09-11 LAB — COXSACKIE B VIRUS ANTIBODIES
Coxsackie B1 Ab: <1:8 {titer}
Coxsackie B3 Ab: <1:8 {titer}
Coxsackie B4 Ab: <1:8 {titer}

## 2015-09-11 NOTE — Patient Care Conference (Signed)
Inpatient RehabilitationTeam Conference and Plan of Care Update Date: 09/11/2015   Time: 9:37 AM    Patient Name: Julie Stein      Medical Record Number: [id number]  Date of Birth: 1935/01/11 Sex: Female         Room/Bed: 4M01C/4M01C-01 Payor Info: Payor: MEDICARE / Plan: MEDICARE PART A / Product Type: *No Product type* /    Admitting Diagnosis: Watershed infarction  Admit Date/Time:  09/06/2015  5:40 PM Admission Comments: No comment available   Primary Diagnosis:  <principal problem not specified> Principal Problem: <principal problem not specified>  Patient Active Problem List   Diagnosis Date Noted  . Gait disturbance, post-stroke 09/10/2015  . Pain in the chest   . H/O: CVA (cerebrovascular accident)   . Syncope and collapse 08/31/2015  . Syncope 08/31/2015  . Chronic systolic heart failure (HCC) 08/31/2015  . Microcytic hypochromic anemia 08/31/2015  . Chest pain   . Hypertension   . Acute on chronic systolic CHF (congestive heart failure) (HCC)   . Acute bilat watershed infarction [facility]) 08/29/2015  . Weakness   . Encounter for central line placement   . Acute respiratory failure with hypercapnia (HCC) 08/26/2015  . Essential hypertension 08/26/2015  . Acute systolic CHF (congestive heart failure), NYHA class 4 (HCC) 08/26/2015  . Pulmonary edema 08/26/2015  . Paroxysmal SVT (supraventricular tachycardia) (HCC) 08/26/2015  . Respiratory failure with hypoxia (HCC) 08/26/2015  . Acute heart failure (HCC)   . Acute respiratory failure with hypoxia (HCC)   . SVT (supraventricular tachycardia) [facility])     Expected Discharge Date: Expected Discharge Date: 09/10/15  Team Members Present: Physician leading conference: Dr. Claudette Laws Social Worker Present: Dossie Der, LCSW Nurse Present: Carmie End, RN PT Present: Alyson Reedy, PT OT Present: Rosalio Loud, OT SLP Present: Jackalyn Lombard, SLP PPS Coordinator present : Tora Duck, RN, CRRN     Current  Status/Progress Goal Weekly Team Focus  Medical     stable medically from cardiac standpoint   medical stability     Bowel/Bladder     cont B & B   cont B & B     Swallow/Nutrition/ Hydration     na        ADL's     bathing, dressing and kitchen tasks   mod/i-independent level   activity tolerance-monitoring heart rate  Mobility     supervision/mod/i level. High level balance and activity tolerance   mod/i -independent level   short length of stay  Communication     na        Safety/Cognition/ Behavioral Observations    no unsafe behaviors        Pain     no pain issues        Skin     no skin issues           *See Care Plan and progress notes for long and short-term goals.  Barriers to Discharge:   decreased heart rate   Possible Resolutions to Barriers:    adjusted medicines   Discharge Planning/Teaching Needs:    Home with family members providing 24 hr care initially-then granddaughter in-law there in the afternoons. Pt high level and will be short length of stay     Team Discussion:  Pt at supervision-mod/i level-only concern is heart rate.  MD adjusted her meds and cardiologist involved, both cleared for dc Monday-per pt and family request.  Revisions to Treatment Plan:  None      [name], Lurena Joiner  G 09/11/2015, 9:37 AM

## 2015-09-11 NOTE — Progress Notes (Signed)
Social Work Lucy Chris, LCSW Social Worker Signed  Patient Care Conference 09/10/2015  3:29 PM    Expand All Collapse All   Inpatient RehabilitationTeam Conference and Plan of Care Update Date: 09/11/2015   Time: 9:37 AM    Patient Name: Julie Stein      Medical Record Number: [id number]  Date of Birth: [birth year] Sex: Female         Room/Bed: 4M01C/4M01C-01 Payor Info: Payor: MEDICARE / Plan: MEDICARE PART A / Product Type: *No Product type* /    Admitting Diagnosis: Watershed infarction   Admit Date/Time:  09/06/2015  5:40 PM Admission Comments: No comment available   Primary Diagnosis:  <principal problem not specified> Principal Problem: <principal problem not specified>    Patient Active Problem List     Diagnosis  Date Noted   .  Gait disturbance, post-stroke  09/10/2015   .  Pain in the chest     .  H/O: CVA (cerebrovascular accident)     .  Syncope and collapse  08/31/2015   .  Syncope  08/31/2015   .  Chronic systolic heart failure (HCC)  94/58/5929   .  Microcytic hypochromic anemia  08/31/2015   .  Chest pain     .  Hypertension     .  Acute on chronic systolic CHF (congestive heart failure) (HCC)     .  Acute bilat watershed infarction [facility])  08/29/2015   .  Weakness     .  Encounter for central line placement     .  Acute respiratory failure with hypercapnia (HCC)  08/26/2015   .  Essential hypertension  08/26/2015   .  Acute systolic CHF (congestive heart failure), NYHA class 4 (HCC)  08/26/2015   .  Pulmonary edema  08/26/2015   .  Paroxysmal SVT (supraventricular tachycardia) (HCC)  08/26/2015   .  Respiratory failure with hypoxia (HCC)  08/26/2015   .  Acute heart failure (HCC)     .  Acute respiratory failure with hypoxia (HCC)     .  SVT (supraventricular tachycardia) [facility])       Expected Discharge Date: Expected Discharge Date: 09/10/15  Team Members Present: Physician leading conference: Dr. Claudette Laws Social Worker Present: Dossie Der, LCSW Nurse Present: Carmie End, RN PT Present: Alyson Reedy, PT OT Present: Rosalio Loud, OT SLP Present: Jackalyn Lombard, SLP PPS Coordinator present : Tora Duck, RN, CRRN        Current Status/Progress  Goal  Weekly Team Focus   Medical       stable medically from cardiac standpoint    medical stability      Bowel/Bladder       cont B & B    cont B & B      Swallow/Nutrition/ Hydration       na         ADL's       bathing, dressing and kitchen tasks    mod/i-independent level    activity tolerance-monitoring heart rate   Mobility       supervision/mod/i level. High level balance and activity tolerance    mod/i -independent level    short length of stay   Communication       na         Safety/Cognition/ Behavioral Observations      no unsafe behaviors         Pain       no pain issues  Skin       no skin issues            *See Care Plan and progress notes for long and short-term goals.    Barriers to Discharge:    decreased heart rate    Possible Resolutions to Barriers:     adjusted medicines    Discharge Planning/Teaching Needs:     Home with family members providing 24 hr care initially-then granddaughter in-law there in the afternoons. Pt high level and will be short length of stay      Team Discussion:    Pt at supervision-mod/i level-only concern is heart rate.  MD adjusted her meds and cardiologist involved, both cleared for dc Monday-per pt and family request.   Revisions to Treatment Plan:    None       Lucy Chris 09/11/2015, 9:37 AM                  Patient ID: Lyn Henri, female   DOB: [birth year], 80 y.o.   MRN: [id number]

## 2015-09-12 ENCOUNTER — Inpatient Hospital Stay (HOSPITAL_COMMUNITY): Payer: Medicare Other

## 2015-09-12 LAB — COXSACKIE A VIRUS ANTIBODIES

## 2015-09-13 ENCOUNTER — Inpatient Hospital Stay: Payer: Medicare Other | Admitting: Family Medicine

## 2015-09-14 ENCOUNTER — Inpatient Hospital Stay: Payer: Medicare Other | Admitting: Family Medicine

## 2015-09-25 ENCOUNTER — Encounter: Payer: Medicare Other | Admitting: Cardiology

## 2015-09-28 ENCOUNTER — Ambulatory Visit (HOSPITAL_COMMUNITY)
Admission: RE | Admit: 2015-09-28 | Discharge: 2015-09-28 | Disposition: A | Discharge status: Home / Self Care | Payer: Medicare Other | Source: Ambulatory Visit | Attending: Cardiology | Admitting: Cardiology

## 2015-09-28 VITALS — BP 190/82 | HR 60 | Wt 106.8 lb

## 2015-09-28 DIAGNOSIS — Z8673 Personal history of transient ischemic attack (TIA), and cerebral infarction without residual deficits: Secondary | ICD-10-CM | POA: Insufficient documentation

## 2015-09-28 DIAGNOSIS — I10 Essential (primary) hypertension: Secondary | ICD-10-CM | POA: Diagnosis not present

## 2015-09-28 DIAGNOSIS — E785 Hyperlipidemia, unspecified: Secondary | ICD-10-CM | POA: Diagnosis not present

## 2015-09-28 DIAGNOSIS — I471 Supraventricular tachycardia: Secondary | ICD-10-CM

## 2015-09-28 DIAGNOSIS — I5022 Chronic systolic (congestive) heart failure: Secondary | ICD-10-CM | POA: Insufficient documentation

## 2015-09-28 DIAGNOSIS — I2510 Atherosclerotic heart disease of native coronary artery without angina pectoris: Secondary | ICD-10-CM | POA: Insufficient documentation

## 2015-09-28 LAB — COMPREHENSIVE METABOLIC PANEL
ALBUMIN: 3.7 g/dL (ref 3.5–5.0)
ALT: 14 U/L (ref 14–54)
AST: 20 U/L (ref 15–41)
Alkaline Phosphatase: 74 U/L (ref 38–126)
Anion gap: 7 (ref 5–15)
BUN: 13 mg/dL (ref 6–20)
CHLORIDE: 107 mmol/L (ref 101–111)
CO2: 25 mmol/L (ref 22–32)
CREATININE: 1.35 mg/dL — AB (ref 0.44–1.00)
Calcium: 9.8 mg/dL (ref 8.9–10.3)
GFR calc Af Amer: 42 mL/min — ABNORMAL LOW (ref >60)
GFR, EST NON AFRICAN AMERICAN: 37 mL/min — AB (ref >60)
GLUCOSE: 111 mg/dL — AB (ref 65–99)
POTASSIUM: 3.8 mmol/L (ref 3.5–5.1)
Sodium: 139 mmol/L (ref 135–145)
Total Bilirubin: 0.5 mg/dL (ref 0.3–1.2)
Total Protein: 7.5 g/dL (ref 6.5–8.1)

## 2015-09-28 LAB — LIPID PANEL
CHOL/HDL RATIO: 2.3 ratio
Cholesterol: 157 mg/dL (ref 0–200)
HDL: 68 mg/dL (ref >40)
LDL CALC: 76 mg/dL (ref 0–99)
Triglycerides: 64 mg/dL (ref <150)
VLDL: 13 mg/dL (ref 0–40)

## 2015-09-28 LAB — TSH: TSH: 1.303 u[IU]/mL (ref 0.350–4.500)

## 2015-09-28 MED ORDER — ISOSORB DINITRATE-HYDRALAZINE 20-37.5 MG PO TABS: 1.0000 | ORAL_TABLET | Freq: Three times a day (TID) | ORAL | Status: DC

## 2015-09-28 NOTE — Patient Instructions (Signed)
Increase Bidil to 1 tab Three times a day   Labs today  Your physician has recommended that you have a pulmonary function test. Pulmonary Function Tests are a group of tests that measure how well air moves in and out of your lungs.  You have been referred to Cardiac Rehab, they will contact you to schedule  Your physician recommends that you schedule a follow-up appointment in: 3-4 weeks

## 2015-09-28 NOTE — Progress Notes (Signed)
Patient ID: Julie Stein, female   DOB: 1935-09-28, 80 y.o.   MRN: [id number] Primary Cardiologist: Dr Shirlee Latch  HPI: Hector Venne is a 80 y.o.  female with a history of SVT, HTN, CAD, mixed ischemic/nonischemic cardiomyopathy, CHF and stroke. She was admitted initially 08/23/15 with pulmonary edema. Echo on 10/9 revealed an EF of 20-25% with diffuse hypokinesis. It was thought she had a viral myocarditis or hypertensive cardiomyopathy. She developed transient weakness and was noted to have CVA by MRI.  She had R/LHC on 10/10 revealing normal to low filling pressures and normal cardiac output, moderate to severe ostial OM1 and severe distal LAD stenoses with plan for medical management. We thought her flash pulmonary edema and troponin elevation was likely from hypertensive crisis leading to demand ischemia. During this time, she was noted to have runs of symptomatic SVT.   Once stable, she was admitted to IP rehab, but then readmitted to hospital with syncope while on bedside commode requiring  CPR.  She had recurrent SVT noted when back on telemetry.  Syncope thought to be 2/2 rapid SVT versus vasovagal.  She had an EP study but SVT could not be triggered so no ablation was done.  Given further recurrences, she was started on amiodarone.  She returns today for post hospital follow up.  Has been doing well since leaving the hospital. Is at home with lots of help. Getting home PT but finishes soon. Does not think that she has residual from the CVA.  Slowly increasing activity.  Has been taking all of her meds. BP high today, but states she usually some White Coat Syndrome and was also death of a close friend this morning.  Recorded pressures at home SBPs in 130-140s. No dizziness, no lightheadedness, no SOB. No CP. Has been on all medications as directed. Watching salt and fluid intake.  No tachypalpitations.  ECG: NSR, LVH with repolarization abnormality  Labs (10/16): K 3.7, creatinine 1.19, HCT  30.1  PMH 1. Chronic systolic CHF: Mixed ischemic/nonischemic CMP.  Suspect long-standing HTN plays a major role.  Echo (10/16) with EF 20-25%, mild LVH, mild MR. 2. CAD:  LHC 10/16 with 20% proximal LAD, 40% mid LAD, 90% distal LAD, large high OM1 with 80% ostial stenosis => medically managed.  3. Hx of CVA: MRI head 08/28/15 showed small infarcts in watershed region.Pre-dated cardiac cath, likely related to HTN.  4. 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  Now on amiodarone given symptomatic frequent recurrences.   5. HTN:  Renal artery dopplers 08/30/15 did not show RAS.  6. Hyperlipidemia.   Current Outpatient Prescriptions  Medication Sig Dispense Refill  . amiodarone (PACERONE) 200 MG tablet Take 200 mg by mouth daily.    Marland Kitchen aspirin EC 81 MG EC tablet Take 1 tablet (81 mg total) by mouth daily. 30 tablet 0  . atorvastatin (LIPITOR) 80 MG tablet Take 1 tablet (80 mg total) by mouth daily at 6 PM. 30 tablet 6  . carvedilol (COREG) 6.25 MG tablet Take 3 tablets (18.75 mg total) by mouth 2 (two) times daily with a meal. 180 tablet 1  . co-enzyme Q-10 30 MG capsule Take 30 mg by mouth daily.    . isosorbide-hydrALAZINE (BIDIL) 20-37.5 MG tablet Take 0.5 tablets by mouth 3 (three) times daily. 90 tablet 0  . losartan (COZAAR) 50 MG tablet Take 1 tablet (50 mg total) by mouth daily. 30 tablet 1  .  Multiple Vitamins-Minerals (MULTIVITAMIN) tablet Take 1 tablet by mouth daily.    . Omega-3 Fatty Acids (FISH OIL) 1000 MG CAPS Take 1,000 mg by mouth daily.    Marland Kitchen spironolactone (ALDACTONE) 25 MG tablet Take 1 tablet (25 mg total) by mouth daily. 30 tablet 1  . nitroGLYCERIN (NITROSTAT) 0.4 MG SL tablet Place 1 tablet (0.4 mg total) under the tongue every 5 (five) minutes as needed for chest pain. (Patient not taking: Reported on 09/28/2015) 30 tablet 12   No current facility-administered medications for this encounter.    Allergies   Allergen Reactions  . Demerol [Meperidine]     intolerance      Social History   Social History  . Marital Status: Married    Spouse Name: N/A  . Number of Children: N/A  . Years of Education: N/A   Occupational History  . Not on file.   Social History Main Topics  . Smoking status: Never Smoker   . Smokeless tobacco: Not on file  . Alcohol Use: No  . Drug Use: No  . Sexual Activity: No   Other Topics Concern  . Not on file   Social History Narrative      Family History  Problem Relation Age of Onset  . Hypertension Daughter     Ceasar Mons Vitals:   09/28/15 1140  BP: 190/82  Pulse: 60  Weight: 106 lb 12.8 oz (48.444 kg)  SpO2: 98%    Wt Readings from Last 3 Encounters:  09/28/15 106 lb 12.8 oz (48.444 kg)  09/06/15 110 lb 14.3 oz (50.3 kg)  09/06/15 106 lb 3.2 oz (48.172 kg)     PHYSICAL EXAM: General:  Well appearing. No respiratory difficulty HEENT: normal Neck: supple. no JVD. Carotids 2+ bilat; no bruits. No lymphadenopathy or thryomegaly appreciated. Cor: PMI nondisplaced. Regular rate & rhythm. No S3/S4. No rubs or murmurs. Lungs: CTA, normal effort Abdomen: soft, nontender, nondistended. No hepatosplenomegaly. No bruits or masses. Good bowel sounds. Extremities: no cyanosis, clubbing, rash, edema Neuro: alert & oriented x 3, cranial nerves grossly intact. moves all 4 extremities w/o difficulty. Affect pleasant.  ASSESSMENT & PLAN: 1. Chronic Systolic HF: Mixed ischemic/nonischemic CMP, suspect long-standing HTN plays a large role. ECHO 10/16 EF 20-25% with mild LVH. Volume status ok. Not on Lasix.   - Continue current doses of Coreg, spironolactone, and losartan - Increase Bidil to 1 tab TID. - Repeat Echo in January - Refer to cardiac rehab. 2. CAD: LHC 08/27/15 with 20% proximal LAD, 40% mid LAD, 90% distal LAD, large high OM1 with 80% ostial stenosis, medically managed.  She has had no chest pain.   - Contine ASA and statin.  - Check  lipids today.  3. CVA: MRI head 08/28/15 showed small infarcts in watershed region.Suspect related to HTN, neurologic symptoms preceded cath.  No residual deficit. 4. H/o SVT with syncope: No symptomatic recurrence. Unable to reproduce with EP study.  - Continue amiodarone.  Will check LFTs and TSH today.  She will need regular eye exams and will get baseline PFTs.  5. HTN: Elevated this morning. Thinks part stress with neighbor being found dead this am, and some element of white coat syndrome. - As above, increase Bidil. Bring home cuff next visit.  - Renal artery dopplers 08/30/15 did not show RAS.   CMET, Lipids, TSH today. Order PFTs. Refer to cardiac rehab. Follow up 3-4 weeks.  Casimiro Needle [address] [place], PA-C 09/28/2015 11:57 AM   Patient seen with PA, agree with  the above note.  She has done well since discharge, soon finishes with home PT.  No residual neurologic deficit.  No chest pain or exertional dyspnea though not very active yet.  No symptomatic SVT recurrence.  - Increase Bidil to 1 tab tid (BP remains high).  - Repeat echo for EF in 1/17.  - Check LFTs, TSH today and check baseline PFTs given amiodarone use.  - Would like her to start cardiac rehab after home PT is done.   Marca Ancona 09/29/2015

## 2015-09-28 NOTE — Progress Notes (Signed)
Advanced Heart Failure Medication Review by a Pharmacist  Does the patient  feel that his/her medications are working for him/her?  yes  Has the patient been experiencing any side effects to the medications prescribed?  no  Does the patient measure his/her own blood pressure or blood glucose at home?  yes   Does the patient have any problems obtaining medications due to transportation or finances?   no  Understanding of regimen: good Understanding of indications: good Potential of compliance: good Patient understands to avoid NSAIDs. Patient understands to avoid decongestants.  Issues to address at subsequent visits: Medicare Part D enrollment status   Pharmacist comments:  Julie Stein is a pleasant 80 yo F presenting with some family members and an updated medication list. She reports excellent compliance with all of her medications. She did not have any specific medication-related questions or concerns for me at this time.   Tyler Deis. Bonnye Fava, PharmD, BCPS, CPP Clinical Pharmacist Pager: [phone] Phone: [phone] 09/28/2015 11:55 AM      Time with patient: 8 minutes Preparation and documentation time: 2 minutes Total time: 10 minutes

## 2015-09-29 ENCOUNTER — Other Ambulatory Visit: Payer: Self-pay | Admitting: Physician Assistant

## 2015-09-29 DIAGNOSIS — I5022 Chronic systolic (congestive) heart failure: Secondary | ICD-10-CM

## 2015-09-29 MED ORDER — ISOSORB DINITRATE-HYDRALAZINE 20-37.5 MG PO TABS
1.0000 | ORAL_TABLET | Freq: Three times a day (TID) | ORAL | Status: DC
Start: 2015-09-29 — End: 2015-10-26

## 2015-10-08 ENCOUNTER — Ambulatory Visit (HOSPITAL_COMMUNITY)
Admission: RE | Admit: 2015-10-08 | Discharge: 2015-10-08 | Disposition: A | Discharge status: Home / Self Care | Payer: Medicare Other | Source: Ambulatory Visit | Attending: Cardiology | Admitting: Cardiology

## 2015-10-08 DIAGNOSIS — I5022 Chronic systolic (congestive) heart failure: Secondary | ICD-10-CM | POA: Insufficient documentation

## 2015-10-08 LAB — PULMONARY FUNCTION TEST
DL/VA % pred: 130 %
DL/VA: 5.08 ml/min/mmHg/L
DLCO UNC % PRED: 84 %
DLCO UNC: 13.7 ml/min/mmHg
FEF 25-75 POST: 2.84 L/s
FEF 25-75 PRE: 2.44 L/s
FEF2575-%Change-Post: 16 %
FEF2575-%PRED-POST: 293 %
FEF2575-%Pred-Pre: 251 %
FEV1-%Change-Post: 6 %
FEV1-%PRED-PRE: 110 %
FEV1-%Pred-Post: 118 %
FEV1-POST: 1.29 L
FEV1-Pre: 1.21 L
FEV1FVC-%Change-Post: -16 %
FEV1FVC-%PRED-PRE: 131 %
FEV6-%Change-Post: 27 %
FEV6-%PRED-POST: 113 %
FEV6-%PRED-PRE: 89 %
FEV6-POST: 1.54 L
FEV6-Pre: 1.21 L
FEV6FVC-%PRED-POST: 106 %
FEV6FVC-%Pred-Pre: 106 %
FVC-%Change-Post: 27 %
FVC-%PRED-POST: 106 %
FVC-%PRED-PRE: 83 %
FVC-POST: 1.54 L
FVC-Pre: 1.21 L
POST FEV6/FVC RATIO: 100 %
PRE FEV1/FVC RATIO: 100 %
Post FEV1/FVC ratio: 84 %
Pre FEV6/FVC Ratio: 100 %
RV % PRED: 453 %
RV: 9.33 L
TLC % PRED: 264 %
TLC: 10.97 L

## 2015-10-08 MED ORDER — ALBUTEROL SULFATE (2.5 MG/3ML) 0.083% IN NEBU
2.5000 mg | INHALATION_SOLUTION | Freq: Once | RESPIRATORY_TRACT | Status: AC
  Administered 2015-10-08: 2.5 mg via RESPIRATORY_TRACT

## 2015-10-10 ENCOUNTER — Telehealth: Payer: Self-pay | Admitting: Licensed Clinical Social Worker

## 2015-10-10 NOTE — Telephone Encounter (Signed)
CSW contacted patient's daughter at her request to assist with information on Medicare. Daughter reports she applied for medicaid and is in need of information about Medicare B. Apparently patient never applied for Medicare B and now unsure. Daughter has done preliminary steps and will follow up. CSW recommended contacting [employer] for additional information and advice on pursuing options for B and extra help program. Daughter very appreciative of information and will follow up. CSW will continue to be available as needed. Lasandra Beech, LCSW [phone]

## 2015-10-18 DIAGNOSIS — I5022 Chronic systolic (congestive) heart failure: Secondary | ICD-10-CM

## 2015-10-18 DIAGNOSIS — N183 Chronic kidney disease, stage 3 (moderate): Secondary | ICD-10-CM

## 2015-10-18 DIAGNOSIS — R079 Chest pain, unspecified: Secondary | ICD-10-CM

## 2015-10-18 DIAGNOSIS — Z8673 Personal history of transient ischemic attack (TIA), and cerebral infarction without residual deficits: Secondary | ICD-10-CM

## 2015-10-18 DIAGNOSIS — I130 Hypertensive heart and chronic kidney disease with heart failure and stage 1 through stage 4 chronic kidney disease, or unspecified chronic kidney disease: Secondary | ICD-10-CM

## 2015-10-18 DIAGNOSIS — Z7982 Long term (current) use of aspirin: Secondary | ICD-10-CM

## 2015-10-18 DIAGNOSIS — I471 Supraventricular tachycardia: Secondary | ICD-10-CM

## 2015-10-26 ENCOUNTER — Encounter (HOSPITAL_COMMUNITY): Payer: Self-pay

## 2015-10-26 ENCOUNTER — Ambulatory Visit (HOSPITAL_COMMUNITY)
Admission: RE | Admit: 2015-10-26 | Discharge: 2015-10-26 | Disposition: A | Discharge status: Home / Self Care | Payer: Medicare Other | Source: Ambulatory Visit | Attending: Cardiology | Admitting: Cardiology

## 2015-10-26 VITALS — BP 194/88 | HR 77 | Resp 18 | Wt 106.5 lb

## 2015-10-26 DIAGNOSIS — E785 Hyperlipidemia, unspecified: Secondary | ICD-10-CM | POA: Insufficient documentation

## 2015-10-26 DIAGNOSIS — I110 Hypertensive heart disease with heart failure: Secondary | ICD-10-CM | POA: Insufficient documentation

## 2015-10-26 DIAGNOSIS — I2510 Atherosclerotic heart disease of native coronary artery without angina pectoris: Secondary | ICD-10-CM | POA: Insufficient documentation

## 2015-10-26 DIAGNOSIS — I471 Supraventricular tachycardia: Secondary | ICD-10-CM | POA: Insufficient documentation

## 2015-10-26 DIAGNOSIS — Z79899 Other long term (current) drug therapy: Secondary | ICD-10-CM | POA: Insufficient documentation

## 2015-10-26 DIAGNOSIS — Z8249 Family history of ischemic heart disease and other diseases of the circulatory system: Secondary | ICD-10-CM | POA: Insufficient documentation

## 2015-10-26 DIAGNOSIS — Z7982 Long term (current) use of aspirin: Secondary | ICD-10-CM | POA: Insufficient documentation

## 2015-10-26 DIAGNOSIS — I5022 Chronic systolic (congestive) heart failure: Secondary | ICD-10-CM | POA: Insufficient documentation

## 2015-10-26 DIAGNOSIS — Z8673 Personal history of transient ischemic attack (TIA), and cerebral infarction without residual deficits: Secondary | ICD-10-CM | POA: Insufficient documentation

## 2015-10-26 LAB — BASIC METABOLIC PANEL
ANION GAP: 7 (ref 5–15)
BUN: 16 mg/dL (ref 6–20)
CALCIUM: 9.6 mg/dL (ref 8.9–10.3)
CO2: 29 mmol/L (ref 22–32)
CREATININE: 1.47 mg/dL — AB (ref 0.44–1.00)
Chloride: 103 mmol/L (ref 101–111)
GFR calc Af Amer: 38 mL/min — ABNORMAL LOW (ref >60)
GFR, EST NON AFRICAN AMERICAN: 33 mL/min — AB (ref >60)
GLUCOSE: 123 mg/dL — AB (ref 65–99)
Potassium: 4.1 mmol/L (ref 3.5–5.1)
Sodium: 139 mmol/L (ref 135–145)

## 2015-10-26 LAB — BRAIN NATRIURETIC PEPTIDE: B Natriuretic Peptide: 59.4 pg/mL (ref 0.0–100.0)

## 2015-10-26 MED ORDER — ISOSORB DINITRATE-HYDRALAZINE 20-37.5 MG PO TABS: 1.5000 | ORAL_TABLET | Freq: Three times a day (TID) | ORAL | Status: DC

## 2015-10-26 NOTE — Patient Instructions (Signed)
INCREASE Bidil to one and one half tab, three times per day  Labs today   Your physician recommends that you schedule a follow-up appointment in: January 2017 with echocardiogram  Your physician has requested that you have an echocardiogram. Echocardiography is a painless test that uses sound waves to create images of your heart. It provides your doctor with information about the size and shape of your heart and how well your heart's chambers and valves are working. This procedure takes approximately one hour. There are no restrictions for this procedure.    Do the following things EVERYDAY: 1) Weigh yourself in the morning before breakfast. Write it down and keep it in a log. 2) Take your medicines as prescribed 3) Eat low salt foods-Limit salt (sodium) to 2000 mg per day.  4) Stay as active as you can everyday 5) Limit all fluids for the day to less than 2 liters 6)

## 2015-10-27 NOTE — Progress Notes (Signed)
Patient ID: Julie Stein, female   DOB: 21-Aug-1935, 80 y.o.   MRN: [id number] Primary Cardiologist: Dr Shirlee Latch  HPI: Julie Stein is a 80 y.o.  female with a history of SVT, HTN, CAD, mixed ischemic/nonischemic cardiomyopathy, CHF and stroke. She was admitted initially 08/23/15 with pulmonary edema. Echo on 10/9 revealed an EF of 20-25% with diffuse hypokinesis. It was thought she had a viral myocarditis or hypertensive cardiomyopathy. She developed transient weakness and was noted to have CVA by MRI.  She had R/LHC on 10/10 revealing normal to low filling pressures and normal cardiac output, moderate to severe ostial OM1 and severe distal LAD stenoses with plan for medical management. We thought her flash pulmonary edema and troponin elevation was likely from hypertensive crisis leading to demand ischemia. During this time, she was noted to have runs of symptomatic SVT.   Once stable, she was admitted to IP rehab, but then readmitted to hospital with syncope while on bedside commode requiring  CPR.  She had recurrent SVT noted when back on telemetry.  Syncope thought to be 2/2 rapid SVT versus vasovagal.  She had an EP study but SVT could not be triggered so no ablation was done.  Given further recurrences, she was started on amiodarone.  She seems to be doing well today.  Getting more active at home.  SBP 130s-140s when she checks at home (daily).  Not sure why so high today, took all meds. No dyspnea now walking on flat ground.  Ok with a flight of steps.  No orthopnea/PND.  No chest pain.  Weight is stable.   Labs (10/16): K 3.7, creatinine 1.19, HCT 30.1 Labs (11/16): K 3.8, creatinine 1.35, LDL 76, HDL 68, TSH normal, LFTs normal  PMH 1. Chronic systolic CHF: Mixed ischemic/nonischemic CMP.  Suspect long-standing HTN plays a major role.  Echo (10/16) with EF 20-25%, mild LVH, mild MR. 2. CAD:  LHC 10/16 with 20% proximal LAD, 40% mid LAD, 90% distal LAD, large high OM1 with 80% ostial stenosis  => medically managed.  3. Hx of CVA: MRI head 08/28/15 showed small infarcts in watershed region.Pre-dated cardiac cath, likely related to HTN.  4. 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  Now on amiodarone given symptomatic frequent recurrences.   5. HTN:  Renal artery dopplers 08/30/15 did not show RAS.  6. Hyperlipidemia. 7. PFTs (11/16): suggestive of reactive airways disease like asthma.    Current Outpatient Prescriptions  Medication Sig Dispense Refill  . amiodarone (PACERONE) 200 MG tablet Take 200 mg by mouth daily.    Marland Kitchen aspirin EC 81 MG EC tablet Take 1 tablet (81 mg total) by mouth daily. 30 tablet 0  . atorvastatin (LIPITOR) 80 MG tablet Take 1 tablet (80 mg total) by mouth daily at 6 PM. 30 tablet 6  . carvedilol (COREG) 6.25 MG tablet Take 3 tablets (18.75 mg total) by mouth 2 (two) times daily with a meal. 180 tablet 1  . co-enzyme Q-10 30 MG capsule Take 30 mg by mouth daily.    . isosorbide-hydrALAZINE (BIDIL) 20-37.5 MG tablet Take 1.5 tablets by mouth 3 (three) times daily. 180 tablet 1  . losartan (COZAAR) 50 MG tablet Take 1 tablet (50 mg total) by mouth daily. 30 tablet 1  . Multiple Vitamins-Minerals (MULTIVITAMIN) tablet Take 1 tablet by mouth daily.    . nitroGLYCERIN (NITROSTAT) 0.4 MG SL tablet Place 1 tablet (0.4 mg total) under  the tongue every 5 (five) minutes as needed for chest pain. 30 tablet 12  . Omega-3 Fatty Acids (FISH OIL) 1000 MG CAPS Take 1,000 mg by mouth daily.    Marland Kitchen spironolactone (ALDACTONE) 25 MG tablet Take 1 tablet (25 mg total) by mouth daily. 30 tablet 1   No current facility-administered medications for this encounter.    Allergies  Allergen Reactions  . Demerol [Meperidine]     intolerance      Social History   Social History  . Marital Status: Married    Spouse Name: N/A  . Number of Children: N/A  . Years of Education: N/A   Occupational History  . Not on file.    Social History Main Topics  . Smoking status: Never Smoker   . Smokeless tobacco: Not on file  . Alcohol Use: No  . Drug Use: No  . Sexual Activity: No   Other Topics Concern  . Not on file   Social History Narrative      Family History  Problem Relation Age of Onset  . Hypertension Daughter     Ceasar Mons Vitals:   10/26/15 1218  BP: 194/88  Pulse: 77  Resp: 18  Weight: 106 lb 8 oz (48.308 kg)  SpO2: 97%    Wt Readings from Last 3 Encounters:  10/26/15 106 lb 8 oz (48.308 kg)  09/28/15 106 lb 12.8 oz (48.444 kg)  09/06/15 110 lb 14.3 oz (50.3 kg)     PHYSICAL EXAM: General:  Well appearing. No respiratory difficulty HEENT: normal Neck: supple. no JVD. Carotids 2+ bilat; no bruits. No lymphadenopathy or thryomegaly appreciated. Cor: PMI nondisplaced. Regular rate & rhythm. No S3/S4. No rubs or murmurs. Lungs: CTA, normal effort Abdomen: soft, nontender, nondistended. No hepatosplenomegaly. No bruits or masses. Good bowel sounds. Extremities: no cyanosis, clubbing, rash, edema Neuro: alert & oriented x 3, cranial nerves grossly intact. moves all 4 extremities w/o difficulty. Affect pleasant.  ASSESSMENT & PLAN: 1. Chronic Systolic HF: Mixed ischemic/nonischemic CMP, suspect long-standing HTN plays a large role. ECHO 10/16 EF 20-25% with mild LVH. Volume status ok. Not on Lasix.   - Continue current doses of Coreg, spironolactone, and losartan - Increase Bidil to 1.5 tabs TID. - Repeat Echo in January 2. CAD: LHC 08/27/15 with 20% proximal LAD, 40% mid LAD, 90% distal LAD, large high OM1 with 80% ostial stenosis, medically managed.  She has had no chest pain.  Good lipids 11/16.  - Contine ASA and statin.  3. CVA: MRI head 08/28/15 showed small infarcts in watershed region.Suspect related to HTN, neurologic symptoms preceded cath.  No residual deficit. 4. H/o SVT with syncope: No symptomatic recurrence. Unable to reproduce with EP study.  - Continue  amiodarone.  Recent LFTs/TSH normal.  She will need regular eye exams and will get baseline PFTs.  5. HTN: Elevated this morning but has been 130s-140s systolic with home checks. - As above, increase Bidil.  - Renal artery dopplers 08/30/15 did not show RAS.   Followup in 1/17 with echo.  Marca Ancona 10/27/2015

## 2015-10-31 ENCOUNTER — Ambulatory Visit: Payer: Medicare Other | Attending: Family Medicine

## 2015-11-01 ENCOUNTER — Other Ambulatory Visit: Payer: Self-pay | Admitting: *Deleted

## 2015-11-01 NOTE — Telephone Encounter (Signed)
Pt needs all three of these medications filled, she has not been seen here @ [address] [facility] only at [facility], please fill for her under [facility]. Thanks  Carvedilol, Losartan & spironolactone

## 2015-11-02 ENCOUNTER — Other Ambulatory Visit (HOSPITAL_COMMUNITY): Payer: Self-pay

## 2015-11-02 ENCOUNTER — Telehealth (HOSPITAL_COMMUNITY): Payer: Self-pay | Admitting: Vascular Surgery

## 2015-11-02 MED ORDER — SPIRONOLACTONE 25 MG PO TABS: 25.0000 mg | ORAL_TABLET | Freq: Every day | ORAL | Status: DC

## 2015-11-02 MED ORDER — CARVEDILOL 6.25 MG PO TABS: 18.7500 mg | ORAL_TABLET | Freq: Two times a day (BID) | ORAL | Status: DC

## 2015-11-02 MED ORDER — LOSARTAN POTASSIUM 50 MG PO TABS: 50.0000 mg | ORAL_TABLET | Freq: Every day | ORAL | Status: DC

## 2015-11-02 NOTE — Telephone Encounter (Signed)
Pt called 3 times this morning she cant get anyone on the nurse kine she needs [facility] Losartan, spironlactone and Carvedilol

## 2015-11-02 NOTE — Telephone Encounter (Signed)
Refills sent electronically to preferred pharmacy.  [facility]

## 2015-11-06 ENCOUNTER — Ambulatory Visit: Payer: Medicare Other

## 2015-11-26 ENCOUNTER — Encounter (HOSPITAL_COMMUNITY): Payer: Medicare Other

## 2015-11-26 ENCOUNTER — Ambulatory Visit: Payer: Medicare Other | Admitting: Family Medicine

## 2015-11-26 ENCOUNTER — Ambulatory Visit (HOSPITAL_COMMUNITY): Admission: RE | Admit: 2015-11-26 | Payer: Medicare Other | Source: Ambulatory Visit

## 2015-11-28 MED FILL — !BIDIL TABLET: 20-37.5MG | 13 days supply | Qty: 60 | Fill #2

## 2015-11-29 ENCOUNTER — Ambulatory Visit (INDEPENDENT_AMBULATORY_CARE_PROVIDER_SITE_OTHER): Payer: Self-pay | Admitting: Family Medicine

## 2015-11-29 ENCOUNTER — Encounter: Payer: Self-pay | Admitting: Family Medicine

## 2015-11-29 VITALS — BP 160/70 | HR 61 | Temp 97.7°F | Ht 59.0 in | Wt 100.0 lb

## 2015-11-29 DIAGNOSIS — I5020 Unspecified systolic (congestive) heart failure: Secondary | ICD-10-CM

## 2015-11-29 DIAGNOSIS — I10 Essential (primary) hypertension: Secondary | ICD-10-CM

## 2015-11-29 DIAGNOSIS — M810 Age-related osteoporosis without current pathological fracture: Secondary | ICD-10-CM

## 2015-11-29 DIAGNOSIS — Z7189 Other specified counseling: Secondary | ICD-10-CM

## 2015-11-29 DIAGNOSIS — D649 Anemia, unspecified: Secondary | ICD-10-CM

## 2015-11-29 DIAGNOSIS — Z7689 Persons encountering health services in other specified circumstances: Secondary | ICD-10-CM

## 2015-11-29 DIAGNOSIS — Z1211 Encounter for screening for malignant neoplasm of colon: Secondary | ICD-10-CM

## 2015-11-29 LAB — COMPLETE METABOLIC PANEL WITH GFR
ALBUMIN: 4 g/dL (ref 3.6–5.1)
ALT: 30 U/L — ABNORMAL HIGH (ref 6–29)
AST: 32 U/L (ref 10–35)
Alkaline Phosphatase: 59 U/L (ref 33–130)
BILIRUBIN TOTAL: 0.4 mg/dL (ref 0.2–1.2)
BUN: 18 mg/dL (ref 7–25)
CHLORIDE: 102 mmol/L (ref 98–110)
CO2: 22 mmol/L (ref 20–31)
Calcium: 9.6 mg/dL (ref 8.6–10.4)
Creat: 1.35 mg/dL — ABNORMAL HIGH (ref 0.60–0.93)
GFR, EST AFRICAN AMERICAN: 43 mL/min — AB (ref >=60)
GFR, EST NON AFRICAN AMERICAN: 38 mL/min — AB (ref >=60)
Glucose, Bld: 94 mg/dL (ref 65–99)
POTASSIUM: 4.3 mmol/L (ref 3.5–5.3)
Sodium: 137 mmol/L (ref 135–146)
TOTAL PROTEIN: 6.8 g/dL (ref 6.1–8.1)

## 2015-11-29 LAB — CBC WITH DIFFERENTIAL/PLATELET
BASOS ABS: 0 10*3/uL (ref 0.0–0.1)
Basophils Relative: 0 % (ref 0–1)
EOS ABS: 0.2 10*3/uL (ref 0.0–0.7)
Eosinophils Relative: 3 % (ref 0–5)
HCT: 32.4 % — ABNORMAL LOW (ref 36.0–46.0)
HEMOGLOBIN: 10.3 g/dL — AB (ref 12.0–15.0)
LYMPHS ABS: 1.1 10*3/uL (ref 0.7–4.0)
LYMPHS PCT: 15 % (ref 12–46)
MCH: 24.1 pg — ABNORMAL LOW (ref 26.0–34.0)
MCHC: 31.8 g/dL (ref 30.0–36.0)
MCV: 75.7 fL — AB (ref 78.0–100.0)
MPV: 10.6 fL (ref 8.6–12.4)
Monocytes Absolute: 0.5 10*3/uL (ref 0.1–1.0)
Monocytes Relative: 6 % (ref 3–12)
NEUTROS ABS: 5.8 10*3/uL (ref 1.7–7.7)
NEUTROS PCT: 76 % (ref 43–77)
PLATELETS: 380 10*3/uL (ref 150–400)
RBC: 4.28 MIL/uL (ref 3.87–5.11)
RDW: 19 % — ABNORMAL HIGH (ref 11.5–15.5)
WBC: 7.6 10*3/uL (ref 4.0–10.5)

## 2015-11-29 NOTE — Progress Notes (Signed)
Patient ID: Julie Stein, female   DOB: 1935-03-10, 81 y.o.   MRN: [id number]   Julie Stein, is a 81 y.o. female  [id number]  [id number]  DOB - 12-04-1934  CC:  Chief Complaint  Patient presents with  . new patient get established    needs refill on all meds, no other complaints       HPI: Julie Stein is a 81 y.o. female here to establish care. She has a recent diagnosis of hypertension, CHF, SVT, CAD, cardiomyopathy and Stroke. She is  On several cardiac medications which are listed in her medication list. She apparently went to [facility] to fill her prescriptions from Dr. Shirlee Latch and was told she would need to come here to establish care and receive her prescriptions from here in order for them to be filled through [facility]. She was admitted to hospital on 08/26/15 with chest pain and acute CHF. She has had several cardiac procedures, including an attempt to ablate area of heart causing SVT. She was admitted again on 10/20 with a Watershed CVA.  She has been through cardiac rehab and PT and reports being essentally back to normal. She denies having any further chest pain, shortness of breath or CVA symptoms. She reports having a flu shot and pneumonia shot recently. She has not had colon cancer screening or a dexa scan.  Allergies  Allergen Reactions  . Demerol [Meperidine]     intolerance   Past Medical History  Diagnosis Date  . Hypertension   . CHF (congestive heart failure) (HCC)   . Stroke (HCC)   . SVT (supraventricular tachycardia) (HCC)   . CAD (coronary artery disease)   . Cardiomyopathy [facility])    Current Outpatient Prescriptions on File Prior to Visit  Medication Sig Dispense Refill  . amiodarone (PACERONE) 200 MG tablet Take 200 mg by mouth daily.    Marland Kitchen aspirin EC 81 MG EC tablet Take 1 tablet (81 mg total) by mouth daily. 30 tablet 0  . atorvastatin (LIPITOR) 80 MG tablet Take 1 tablet (80 mg total) by mouth daily at 6 PM. 30 tablet 6  . carvedilol (COREG) 6.25 MG tablet Take  3 tablets (18.75 mg total) by mouth 2 (two) times daily with a meal. 180 tablet 1  . co-enzyme Q-10 30 MG capsule Take 30 mg by mouth daily.    . isosorbide-hydrALAZINE (BIDIL) 20-37.5 MG tablet Take 1.5 tablets by mouth 3 (three) times daily. 180 tablet 1  . losartan (COZAAR) 50 MG tablet Take 1 tablet (50 mg total) by mouth daily. 30 tablet 1  . Multiple Vitamins-Minerals (MULTIVITAMIN) tablet Take 1 tablet by mouth daily.    . nitroGLYCERIN (NITROSTAT) 0.4 MG SL tablet Place 1 tablet (0.4 mg total) under the tongue every 5 (five) minutes as needed for chest pain. 30 tablet 12  . Omega-3 Fatty Acids (FISH OIL) 1000 MG CAPS Take 1,000 mg by mouth daily.    Marland Kitchen spironolactone (ALDACTONE) 25 MG tablet Take 1 tablet (25 mg total) by mouth daily. 30 tablet 1   No current facility-administered medications on file prior to visit.   Family History  Problem Relation Age of Onset  . Hypertension Daughter    Social History   Social History  . Marital Status: Married    Spouse Name: N/A  . Number of Children: N/A  . Years of Education: N/A   Occupational History  . Not on file.   Social History Main Topics  . Smoking status: Never Smoker   .  Smokeless tobacco: Not on file  . Alcohol Use: No  . Drug Use: No  . Sexual Activity: No   Other Topics Concern  . Not on file   Social History Narrative    Review of Systems: Constitutional: Negative for fever, chills, appetite change, weight loss,  Fatigue. Skin: Negative for rashes or lesions of concern. HENT: Negative for ear pain, ear discharge.nose bleeds Eyes: Negative for pain, discharge, redness, itching and visual disturbance.Wears reading glasses Neck: Negative for pain, stiffness Respiratory: Negative for cough, shortness of breath,   Cardiovascular: Negative for chest pain, palpitations and leg swelling. History of SVT but denies an recent palpitation.  Gastrointestinal: Negative for abdominal pain, nausea, vomiting, diarrhea,  constipations Genitourinary: Negative for dysuria, urgency, frequency, hematuria,  Musculoskeletal: Negative for back pain, joint pain, joint  swelling, and gait problem.Negative for weakness. Had left side weakness due to recent CTA but feels essentially back to normal Neurological: Negative for dizziness, tremors, seizures, syncope,   light-headedness, numbness and headaches.  Hematological: Negative for easy bruising or bleeding Psychiatric/Behavioral: Negative for depression, anxiety, decreased concentration, confusion   Objective:   Filed Vitals:   11/29/15 0922 11/29/15 1029  BP: 175/74 160/70  Pulse: 61   Temp: 97.7 F (36.5 C)     Physical Exam: Constitutional: Patient appears well-developed and well-nourished. No distress. HENT: Normocephalic, atraumatic, External right and left ear normal. Oropharynx is clear and moist.  Eyes: Conjunctivae and EOM are normal. PERRLA, no scleral icterus. Neck: Normal ROM. Neck supple. No lymphadenopathy, No thyromegaly. CVS: RRR, S1/S2 +, no murmurs, no gallops, no rubs Pulmonary: Effort and breath sounds normal, no stridor, rhonchi, wheezes, rales.  Abdominal: Soft. Normoactive BS,, no distension, tenderness, rebound or guarding.  Musculoskeletal: Normal range of motion. No edema and no tenderness.  Neuro: Alert.Normal muscle tone coordination. Non-focal Skin: Skin is warm and dry. No rash noted. Not diaphoretic. No erythema. No pallor. Psychiatric: Normal mood and affect. Behavior, judgment, thought content normal.  Lab Results  Component Value Date   WBC 9.1 09/07/2015   HGB 9.6* 09/07/2015   HCT 30.1* 09/07/2015   MCV 72.5* 09/07/2015   PLT 300 09/07/2015   Lab Results  Component Value Date   CREATININE 1.47* 10/26/2015   BUN 16 10/26/2015   NA 139 10/26/2015   K 4.1 10/26/2015   CL 103 10/26/2015   CO2 29 10/26/2015    Lab Results  Component Value Date   HGBA1C 6.2* 08/27/2015   Lipid Panel     Component Value  Date/Time   CHOL 157 09/28/2015 1245   TRIG 64 09/28/2015 1245   HDL 68 09/28/2015 1245   CHOLHDL 2.3 09/28/2015 1245   VLDL 13 09/28/2015 1245   LDLCALC 76 09/28/2015 1245       Assessment and plan:   1. Anemia, unspecified anemia type  - CBC w/Diff  2. Essential hypertension -Follow-up with Dr. Shirlee Latch.  Second reading was within acceptable limitis.   - COMPLETE METABOLIC PANEL WITH GFR - TSH  3. Encounter to establish care -I have reviewed information provided by the patient and pertinent information and labs in her chart.   4. Systolic congestive heart failure, unspecified congestive heart failure chronicity (HCC) Follow-up with Dr. Shirlee Latch as planned.  5. Need for colon cancer screening -referral to GI   Return in about 3 months (around 02/27/2016).  The patient was given clear instructions to go to ER or return to medical center if symptoms don't improve, worsen or new problems develop.  The patient verbalized understanding.    Henrietta Hoover FNP  11/29/2015, 1:00 PM

## 2015-11-29 NOTE — Patient Instructions (Signed)
Follow-up with Dr. Shirlee Latch as planned. Medications per Dr. Shirlee Latch We will find out what you need to do about your medications. Come back in 3 months and as needed for other health problems Careful of salt in diet.

## 2015-11-30 LAB — TSH: TSH: 1.392 u[IU]/mL (ref 0.350–4.500)

## 2015-12-04 ENCOUNTER — Telehealth: Payer: Self-pay

## 2015-12-04 NOTE — Telephone Encounter (Signed)
-----   Message from Henrietta Hoover, NP sent at 12/03/2015  7:58 AM EST ----- Creatine 1.35 down from 1.47 one month ago but up from 1.19 2 months. HBG 10.3 up from 9.6 one month ago.

## 2015-12-04 NOTE — Telephone Encounter (Signed)
CMA called patient, patient verified name and DOB. Patient was given lab results verbalized she understood with no further questions. 

## 2015-12-05 ENCOUNTER — Other Ambulatory Visit (HOSPITAL_COMMUNITY): Payer: Self-pay | Admitting: *Deleted

## 2015-12-05 MED ORDER — AMIODARONE HCL 200 MG PO TABS: 200.0000 mg | ORAL_TABLET | Freq: Every day | ORAL | Status: DC

## 2015-12-05 MED FILL — ATORVASTATIN 80 MG TABLET: 80 | 30 days supply | Qty: 30 | Fill #3

## 2015-12-05 MED FILL — AMIODARONE HCL 200 MG TAB: 200 | 30 days supply | Qty: 30 | Fill #0

## 2015-12-05 MED FILL — CARVEDILOL 6.25 MG TABLET: 6.25 | 30 days supply | Qty: 180 | Fill #1

## 2015-12-05 MED FILL — LOSARTAN POTASSIUM 50 MG TA: 50 | 30 days supply | Qty: 30 | Fill #1

## 2015-12-05 MED FILL — SPIRONOLACTONE 25 MG TABLET: 25 | 30 days supply | Qty: 30 | Fill #1

## 2015-12-11 MED FILL — !BIDIL TABLET: 20-37.5MG | 13 days supply | Qty: 60 | Fill #3

## 2015-12-17 ENCOUNTER — Ambulatory Visit (HOSPITAL_COMMUNITY)
Admission: RE | Admit: 2015-12-17 | Discharge: 2015-12-17 | Disposition: A | Discharge status: Home / Self Care | Payer: Self-pay | Source: Ambulatory Visit | Attending: Cardiology | Admitting: Cardiology

## 2015-12-17 ENCOUNTER — Ambulatory Visit (HOSPITAL_BASED_OUTPATIENT_CLINIC_OR_DEPARTMENT_OTHER)
Admission: RE | Admit: 2015-12-17 | Discharge: 2015-12-17 | Disposition: A | Discharge status: Home / Self Care | Payer: Self-pay | Source: Ambulatory Visit | Attending: Cardiology | Admitting: Cardiology

## 2015-12-17 ENCOUNTER — Encounter (HOSPITAL_COMMUNITY): Payer: Self-pay

## 2015-12-17 VITALS — BP 158/70 | HR 61 | Wt 100.5 lb

## 2015-12-17 DIAGNOSIS — I5022 Chronic systolic (congestive) heart failure: Secondary | ICD-10-CM

## 2015-12-17 DIAGNOSIS — I10 Essential (primary) hypertension: Secondary | ICD-10-CM | POA: Insufficient documentation

## 2015-12-17 DIAGNOSIS — I5021 Acute systolic (congestive) heart failure: Secondary | ICD-10-CM | POA: Insufficient documentation

## 2015-12-17 DIAGNOSIS — I071 Rheumatic tricuspid insufficiency: Secondary | ICD-10-CM | POA: Insufficient documentation

## 2015-12-17 DIAGNOSIS — I517 Cardiomegaly: Secondary | ICD-10-CM | POA: Insufficient documentation

## 2015-12-17 DIAGNOSIS — I471 Supraventricular tachycardia: Secondary | ICD-10-CM

## 2015-12-17 DIAGNOSIS — Z8673 Personal history of transient ischemic attack (TIA), and cerebral infarction without residual deficits: Secondary | ICD-10-CM

## 2015-12-17 MED ORDER — ROSUVASTATIN CALCIUM 20 MG PO TABS: 20.0000 mg | ORAL_TABLET | Freq: Every day | ORAL | Status: DC

## 2015-12-17 MED ORDER — AMIODARONE HCL 200 MG PO TABS: 100.0000 mg | ORAL_TABLET | Freq: Every day | ORAL | Status: DC

## 2015-12-17 MED FILL — ROSUVASTATIN CAL 20 MG TAB: 20 | 30 days supply | Qty: 30 | Fill #0

## 2015-12-17 NOTE — Progress Notes (Signed)
  Echocardiogram 2D Echocardiogram has been performed.  Arvil Chaco 12/17/2015, 12:13 PM

## 2015-12-17 NOTE — Patient Instructions (Signed)
Decrease Amiodarone to 100 mg (1/2 tab) daily  Stop Atorvastatin  Start Crestor 20 mg daily  We will contact you in 3 months to schedule your next appointment.

## 2015-12-18 NOTE — Progress Notes (Signed)
Patient ID: Julie Stein, female   DOB: 1935/10/01, 81 y.o.   MRN: [id number]  PCP: [facility] Primary Cardiologist: Dr Shirlee Latch  HPI: Julie Stein is a 81 y.o.  female with a history of SVT, HTN, CAD, mixed ischemic/nonischemic cardiomyopathy, CHF and stroke. She was admitted initially 08/23/15 with pulmonary edema. Echo on 10/9 revealed an EF of 20-25% with diffuse hypokinesis. It was thought she had a viral myocarditis or hypertensive cardiomyopathy. She developed transient weakness and was noted to have CVA by MRI.  She had R/LHC on 10/10 revealing normal to low filling pressures and normal cardiac output, moderate to severe ostial OM1 and severe distal LAD stenoses with plan for medical management. We thought her flash pulmonary edema and troponin elevation was likely from hypertensive crisis leading to demand ischemia. During this time, she was noted to have runs of symptomatic SVT.   Once stable, she was admitted to IP rehab, but then readmitted to hospital with syncope while on bedside commode requiring  CPR.  She had recurrent SVT noted when back on telemetry.  Syncope thought to be 2/2 rapid SVT versus vasovagal.  She had an EP study but SVT could not be triggered so no ablation was done.  Given further recurrences, she was started on amiodarone.  Echo was done today, showing EF improved to 60-65%.  She seems to be doing well today.  BP high today but has been running in the 120s-130s systolic at home. No dyspnea now walking on flat ground. Ok with a flight of steps.  No orthopnea/PND.  No chest pain.  Weight is down 6 lbs.  No lightheadedness. No stroke-like symptoms.   Labs (10/16): K 3.7, creatinine 1.19, HCT 30.1 Labs (11/16): K 3.8, creatinine 1.35, LDL 76, HDL 68, TSH normal, LFTs normal Labs (1/17): K 4.3, creatinine 1.35, TSH normal, AST 32, ALT 30, HCT 32.4.   PMH 1. Chronic systolic CHF: Mixed ischemic/nonischemic CMP.  Suspect long-standing HTN plays a major role.  Echo  (10/16) with EF 20-25%, mild LVH, mild MR. Echo (1/17) with EF 60-65%, mild TR, severe LAE.  2. CAD:  LHC 10/16 with 20% proximal LAD, 40% mid LAD, 90% distal LAD, large high OM1 with 80% ostial stenosis => medically managed.  3. Hx of CVA: MRI head 08/28/15 showed small infarcts in watershed region.Pre-dated cardiac cath, likely related to HTN.  4. 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  Now on amiodarone given symptomatic frequent recurrences.   5. HTN:  Renal artery dopplers 08/30/15 did not show RAS.  6. Hyperlipidemia. 7. PFTs (11/16): suggestive of reactive airways disease like asthma.    Current Outpatient Prescriptions  Medication Sig Dispense Refill  . amiodarone (PACERONE) 200 MG tablet Take 0.5 tablets (100 mg total) by mouth daily. 30 tablet 3  . aspirin EC 81 MG EC tablet Take 1 tablet (81 mg total) by mouth daily. 30 tablet 0  . carvedilol (COREG) 6.25 MG tablet Take 3 tablets (18.75 mg total) by mouth 2 (two) times daily with a meal. 180 tablet 1  . co-enzyme Q-10 30 MG capsule Take 30 mg by mouth daily.    . isosorbide-hydrALAZINE (BIDIL) 20-37.5 MG tablet Take 1.5 tablets by mouth 3 (three) times daily. 180 tablet 1  . losartan (COZAAR) 50 MG tablet Take 1 tablet (50 mg total) by mouth daily. 30 tablet 1  . Multiple Vitamins-Minerals (MULTIVITAMIN) tablet Take 1 tablet by mouth daily.    Marland Kitchen  Omega-3 Fatty Acids (FISH OIL) 1000 MG CAPS Take 1,000 mg by mouth daily.    Marland Kitchen spironolactone (ALDACTONE) 25 MG tablet Take 1 tablet (25 mg total) by mouth daily. 30 tablet 1  . nitroGLYCERIN (NITROSTAT) 0.4 MG SL tablet Place 1 tablet (0.4 mg total) under the tongue every 5 (five) minutes as needed for chest pain. (Patient not taking: Reported on 12/17/2015) 30 tablet 12  . rosuvastatin (CRESTOR) 20 MG tablet Take 1 tablet (20 mg total) by mouth daily. 30 tablet 6   No current facility-administered medications for this  encounter.    Allergies  Allergen Reactions  . Demerol [Meperidine]     intolerance      Social History   Social History  . Marital Status: Married    Spouse Name: N/A  . Number of Children: N/A  . Years of Education: N/A   Occupational History  . Not on file.   Social History Main Topics  . Smoking status: Never Smoker   . Smokeless tobacco: Not on file  . Alcohol Use: No  . Drug Use: No  . Sexual Activity: No   Other Topics Concern  . Not on file   Social History Narrative      Family History  Problem Relation Age of Onset  . Hypertension Daughter     Ceasar Mons Vitals:   12/17/15 1214  BP: 158/70  Pulse: 61  Weight: 100 lb 8 oz (45.587 kg)  SpO2: 99%    Wt Readings from Last 3 Encounters:  12/17/15 100 lb 8 oz (45.587 kg)  11/29/15 100 lb (45.36 kg)  10/26/15 106 lb 8 oz (48.308 kg)     PHYSICAL EXAM: General:  Well appearing. No respiratory difficulty HEENT: normal Neck: supple. no JVD. Carotids 2+ bilat; no bruits. No lymphadenopathy or thryomegaly appreciated. Cor: PMI nondisplaced. Regular rate & rhythm. No S3/S4. No rubs or murmurs. Lungs: CTA, normal effort Abdomen: soft, nontender, nondistended. No hepatosplenomegaly. No bruits or masses. Good bowel sounds. Extremities: no cyanosis, clubbing, rash, edema Neuro: alert & oriented x 3, cranial nerves grossly intact. moves all 4 extremities w/o difficulty. Affect pleasant.  ASSESSMENT & PLAN: 1. Chronic Systolic HF: Mixed ischemic/nonischemic CMP, suspect long-standing HTN played a large role. ECHO 10/16 EF 20-25% with mild LVH. Repeat echo today (1/17) was reviewed and showed EF improved back to normal range, 60-65%.  Euvolemic on exam.  NYHA class II.   - Continue current doses of Coreg, spironolactone, Bidil, and losartan 2. CAD: LHC 08/27/15 with 20% proximal LAD, 40% mid LAD, 90% distal LAD, large high OM1 with 80% ostial stenosis, medically managed.  She has had no chest pain.  Good lipids  11/16.  - Contine ASA and statin.  3. CVA: MRI head 08/28/15 showed small infarcts in watershed region.Suspect related to HTN, neurologic symptoms preceded cath.  No residual deficit. 4. H/o SVT with syncope: No symptomatic recurrence. Unable to reproduce with EP study.  - I will decrease amiodarone to 100 mg daily at this point.  Very slight AST elevation when recently checked.  TSH normal.  She will need regular eye exams.  5. HTN: Elevated this morning but has been 120s-130s systolic with home checks. Renal artery dopplers 08/30/15 did not show RAS.  - Continue current meds.  6. Hyperlipidemia: Having trouble swallowing atorvastatin even when cut in half.  I will stop atorvastatin and have her take Crestor 20 mg daily instead.  This is a smaller pill.  Lipids/LFTs in 2 months.  Marca Ancona 12/18/2015

## 2015-12-21 MED FILL — !BIDIL TABLET: 20-37.5MG | 13 days supply | Qty: 60 | Fill #4

## 2016-01-02 ENCOUNTER — Other Ambulatory Visit (HOSPITAL_COMMUNITY): Payer: Self-pay

## 2016-01-02 DIAGNOSIS — I5022 Chronic systolic (congestive) heart failure: Secondary | ICD-10-CM

## 2016-01-02 MED ORDER — ISOSORB DINITRATE-HYDRALAZINE 20-37.5 MG PO TABS: 1.5000 | ORAL_TABLET | Freq: Three times a day (TID) | ORAL | Status: DC

## 2016-01-02 MED ORDER — CARVEDILOL 6.25 MG PO TABS: 18.7500 mg | ORAL_TABLET | Freq: Two times a day (BID) | ORAL | Status: DC

## 2016-01-02 MED ORDER — SPIRONOLACTONE 25 MG PO TABS: 25.0000 mg | ORAL_TABLET | Freq: Every day | ORAL | Status: DC

## 2016-01-02 MED ORDER — LOSARTAN POTASSIUM 50 MG PO TABS: 50.0000 mg | ORAL_TABLET | Freq: Every day | ORAL | Status: DC

## 2016-01-02 MED FILL — CARVEDILOL 6.25 MG TABLET: 6.25 | 30 days supply | Qty: 180 | Fill #0

## 2016-01-02 MED FILL — SPIRONOLACTONE 25 MG TABLET: 25 | 30 days supply | Qty: 30 | Fill #0

## 2016-01-02 MED FILL — LOSARTAN POTASSIUM 50 MG TA: 50 | 30 days supply | Qty: 30 | Fill #0

## 2016-01-02 MED FILL — !BIDIL TABLET: 20-37.5MG | 20 days supply | Qty: 90 | Fill #0

## 2016-01-16 MED FILL — ROSUVASTATIN CAL 20 MG TAB: 20 | 30 days supply | Qty: 30 | Fill #1

## 2016-01-28 MED FILL — AMIODARONE HCL 200 MG TAB: 200 | 30 days supply | Qty: 30 | Fill #1

## 2016-01-28 MED FILL — BIDIL TABLET: 20-37.5 | 30 days supply | Qty: 90 | Fill #1

## 2016-01-28 MED FILL — LOSARTAN POTASSIUM 50 MG TA: 50 | 30 days supply | Qty: 30 | Fill #1

## 2016-01-28 MED FILL — CARVEDILOL 6.25 MG TABLET: 6.25 | 30 days supply | Qty: 180 | Fill #1

## 2016-01-28 MED FILL — SPIRONOLACTONE 25 MG TABLET: 25 | 30 days supply | Qty: 30 | Fill #1

## 2016-02-18 MED FILL — BIDIL TABLET: 20-37.5 | 30 days supply | Qty: 90 | Fill #2

## 2016-02-18 MED FILL — ROSUVASTATIN CAL 20 MG TAB: 20 | 30 days supply | Qty: 30 | Fill #2

## 2016-02-28 ENCOUNTER — Ambulatory Visit: Payer: Medicare Other | Admitting: Family Medicine

## 2016-03-05 MED FILL — CARVEDILOL 6.25 MG TABLET: 6.25 | 30 days supply | Qty: 180 | Fill #2

## 2016-03-05 MED FILL — LOSARTAN POTASSIUM 50 MG TA: 50 | 30 days supply | Qty: 30 | Fill #2

## 2016-03-05 MED FILL — SPIRONOLACTONE 25 MG TABLET: 25 | 30 days supply | Qty: 30 | Fill #2

## 2016-03-07 ENCOUNTER — Ambulatory Visit: Payer: Medicare Other | Admitting: Family Medicine

## 2016-03-10 MED FILL — BIDIL TABLET: 20-37.5 | 20 days supply | Qty: 90 | Fill #3

## 2016-03-18 MED FILL — ROSUVASTATIN CAL 20 MG TAB: 20 | 30 days supply | Qty: 30 | Fill #3

## 2016-03-21 ENCOUNTER — Ambulatory Visit: Payer: Medicare Other | Admitting: Family Medicine

## 2016-03-31 MED FILL — BIDIL TABLET: 20-37.5 | 30 days supply | Qty: 135 | Fill #4

## 2016-03-31 MED FILL — SPIRONOLACTONE 25 MG TABLET: 25 | 30 days supply | Qty: 30 | Fill #3

## 2016-04-07 ENCOUNTER — Ambulatory Visit (INDEPENDENT_AMBULATORY_CARE_PROVIDER_SITE_OTHER): Payer: Medicare Other | Admitting: Family Medicine

## 2016-04-07 ENCOUNTER — Encounter: Payer: Self-pay | Admitting: Family Medicine

## 2016-04-07 VITALS — BP 170/71 | HR 74 | Temp 98.1°F | Wt 86.0 lb

## 2016-04-07 DIAGNOSIS — I10 Essential (primary) hypertension: Secondary | ICD-10-CM | POA: Diagnosis not present

## 2016-04-07 DIAGNOSIS — Z23 Encounter for immunization: Secondary | ICD-10-CM

## 2016-04-07 DIAGNOSIS — Z1211 Encounter for screening for malignant neoplasm of colon: Secondary | ICD-10-CM | POA: Diagnosis not present

## 2016-04-07 LAB — COMPLETE METABOLIC PANEL WITH GFR
ALBUMIN: 3.2 g/dL — AB (ref 3.6–5.1)
ALK PHOS: 55 U/L (ref 33–130)
ALT: 197 U/L — ABNORMAL HIGH (ref 6–29)
AST: 123 U/L — ABNORMAL HIGH (ref 10–35)
BILIRUBIN TOTAL: 0.3 mg/dL (ref 0.2–1.2)
BUN: 16 mg/dL (ref 7–25)
CALCIUM: 8.9 mg/dL (ref 8.6–10.4)
CO2: 24 mmol/L (ref 20–31)
Chloride: 103 mmol/L (ref 98–110)
Creat: 1.34 mg/dL — ABNORMAL HIGH (ref 0.60–0.93)
GFR, EST AFRICAN AMERICAN: 44 mL/min — AB (ref >=60)
GFR, EST NON AFRICAN AMERICAN: 38 mL/min — AB (ref >=60)
Glucose, Bld: 98 mg/dL (ref 65–99)
Potassium: 3.7 mmol/L (ref 3.5–5.3)
Sodium: 139 mmol/L (ref 135–146)
TOTAL PROTEIN: 5.9 g/dL — AB (ref 6.1–8.1)

## 2016-04-07 LAB — CBC WITH DIFFERENTIAL/PLATELET
BASOS ABS: 0 {cells}/uL (ref 0–200)
Basophils Relative: 0 %
EOS PCT: 1 %
Eosinophils Absolute: 50 cells/uL (ref 15–500)
HCT: 32.8 % — ABNORMAL LOW (ref 35.0–45.0)
HEMOGLOBIN: 10 g/dL — AB (ref 11.7–15.5)
Lymphocytes Relative: 17 %
Lymphs Abs: 850 cells/uL (ref 850–3900)
MCH: 24.2 pg — AB (ref 27.0–33.0)
MCHC: 30.5 g/dL — AB (ref 32.0–36.0)
MCV: 79.4 fL — ABNORMAL LOW (ref 80.0–100.0)
MONOS PCT: 9 %
MPV: 11.9 fL (ref 7.5–12.5)
Monocytes Absolute: 450 cells/uL (ref 200–950)
NEUTROS ABS: 3650 {cells}/uL (ref 1500–7800)
Neutrophils Relative %: 73 %
Platelets: 356 10*3/uL (ref 140–400)
RBC: 4.13 MIL/uL (ref 3.80–5.10)
RDW: 16.8 % — AB (ref 11.0–15.0)
WBC: 5 10*3/uL (ref 3.8–10.8)

## 2016-04-07 LAB — LIPID PANEL
CHOLESTEROL: 130 mg/dL (ref 125–200)
HDL: 70 mg/dL (ref >=46)
LDL Cholesterol: 44 mg/dL (ref <130)
TRIGLYCERIDES: 82 mg/dL (ref <150)
Total CHOL/HDL Ratio: 1.9 Ratio (ref <=5.0)
VLDL: 16 mg/dL (ref <30)

## 2016-04-07 LAB — TSH: TSH: 1.58 mIU/L

## 2016-04-07 MED FILL — CARVEDILOL 6.25 MG TABLET: 6.25 | 30 days supply | Qty: 180 | Fill #3

## 2016-04-07 MED FILL — LOSARTAN POTASSIUM 50 MG TA: 50 | 30 days supply | Qty: 30 | Fill #3

## 2016-04-07 NOTE — Patient Instructions (Signed)
Add ensure to diet. In addition try to eat regularly of health fruits and vegetables and lean meat.  Come back to see me 3 months after seeing Dr. Shirlee Latch. Sooner if needed or if weight loss continues.

## 2016-04-07 NOTE — Progress Notes (Signed)
Patient ID: Julie. Stein, female   DOB: 09/21/1935, 81 y.o.   MRN: [id number]   Julie Stein, is a 81 y.o. female  [id number]  [id number]  DOB - 29-Aug-1935  CC:  Chief Complaint  Patient presents with  . follow up    has had some weight loss due to recent virus and decreased appetitie, also is a little weak from the virus but geels much better than she did. has seen Dr. Violeta Gelinas and is scheduled to go back to see him soon       HPI: Julie Stein is a 81 y.o. female here for routine follow-up. She has a complicated cardiovascular history and is followed Dr. Dr. Shirlee Latch who prescribes all her medications. She is due to see him in June. Today her only complaint is of feeling like she might have a mild viral URI and of being weak. She does report that over last few weeks to months, she has had less appetite and has lost weight. She has never weight over 106 except when pregnant. She is 4'11''. She currently weight 86 pounds. She has recently started drinking a protein supplement.  Allergies  Allergen Reactions  . Demerol [Meperidine]     intolerance   Past Medical History  Diagnosis Date  . Hypertension   . CHF (congestive heart failure) (HCC)   . Stroke (HCC)   . SVT (supraventricular tachycardia) (HCC)   . CAD (coronary artery disease)   . Cardiomyopathy [facility])    Current Outpatient Prescriptions on File Prior to Visit  Medication Sig Dispense Refill  . amiodarone (PACERONE) 200 MG tablet Take 0.5 tablets (100 mg total) by mouth daily. 30 tablet 3  . aspirin EC 81 MG EC tablet Take 1 tablet (81 mg total) by mouth daily. 30 tablet 0  . carvedilol (COREG) 6.25 MG tablet Take 3 tablets (18.75 mg total) by mouth 2 (two) times daily with a meal. 270 tablet 3  . co-enzyme Q-10 30 MG capsule Take 30 mg by mouth daily.    . isosorbide-hydrALAZINE (BIDIL) 20-37.5 MG tablet Take 1.5 tablets by mouth 3 (three) times daily. 405 tablet 3  . losartan (COZAAR) 50 MG tablet Take 1 tablet (50 mg  total) by mouth daily. 90 tablet 3  . Multiple Vitamins-Minerals (MULTIVITAMIN) tablet Take 1 tablet by mouth daily.    . Omega-3 Fatty Acids (FISH OIL) 1000 MG CAPS Take 1,000 mg by mouth daily.    . rosuvastatin (CRESTOR) 20 MG tablet Take 1 tablet (20 mg total) by mouth daily. 30 tablet 6  . spironolactone (ALDACTONE) 25 MG tablet Take 1 tablet (25 mg total) by mouth daily. 90 tablet 3  . nitroGLYCERIN (NITROSTAT) 0.4 MG SL tablet Place 1 tablet (0.4 mg total) under the tongue every 5 (five) minutes as needed for chest pain. (Patient not taking: Reported on 12/17/2015) 30 tablet 12   No current facility-administered medications on file prior to visit.   Family History  Problem Relation Age of Onset  . Hypertension Daughter    Social History   Social History  . Marital Status: Married    Spouse Name: N/A  . Number of Children: N/A  . Years of Education: N/A   Occupational History  . Not on file.   Social History Main Topics  . Smoking status: Never Smoker   . Smokeless tobacco: Not on file  . Alcohol Use: No  . Drug Use: No  . Sexual Activity: No   Other Topics Concern  .  Not on file   Social History Narrative    Review of Systems: Constitutional: Negative for fever, chills. Positive for appetite loss and weight loss, with some resultant fatigue and weakness. Skin: Negative for rashes or lesions of concern. HENT: Negative for ear pain, ear discharge.nose bleeds Eyes: Negative for pain, discharge, redness, itching and visual disturbance. Neck: Negative for pain, stiffness Respiratory: Positive for mild coughing for a few days. Cardiovascular: Negative for chest pain, palpitations and leg swelling. Gastrointestinal: Negative for abdominal pain, nausea, vomiting, diarrhea, constipations Genitourinary: Negative for dysuria, urgency, frequency, hematuria,  Musculoskeletal: Negative for back pain, joint pain, joint  swelling, and gait problem. Neurological: Negative for  dizziness, tremors, seizures, syncope,   light-headedness, numbness and headaches.  Hematological: Negative for easy bruising or bleeding Psychiatric/Behavioral: Negative for depression, anxiety, decreased concentration, confusion   Objective:   Filed Vitals:   04/07/16 1324  BP: 170/71  Pulse: 74  Temp: 98.1 F (36.7 C)    Physical Exam: Constitutional: Patient appears well-developed and well-nourished. No distress. Is thin but BMI is 17.36 HENT: Normocephalic, atraumatic, External right and left ear normal. Oropharynx is clear and moist.  Eyes: Conjunctivae and EOM are normal. PERRLA, no scleral icterus. Neck: Normal ROM. Neck supple. No lymphadenopathy, No thyromegaly. CVS: RRR, S1/S2 +, no murmurs, no gallops, no rubs Pulmonary: Effort and breath sounds normal, no stridor, rhonchi, wheezes, rales.  Abdominal: Soft. Normoactive BS,, no distension, tenderness, rebound or guarding.  Musculoskeletal: Normal range of motion. No edema and no tenderness.  Neuro: Alert.Normal muscle tone coordination. Non-focal Skin: Skin is warm and dry. No rash noted. Not diaphoretic. No erythema. No pallor. Psychiatric: Normal mood and affect. Behavior, judgment, thought content normal.  Lab Results  Component Value Date   WBC 7.6 11/29/2015   HGB 10.3* 11/29/2015   HCT 32.4* 11/29/2015   MCV 75.7* 11/29/2015   PLT 380 11/29/2015   Lab Results  Component Value Date   CREATININE 1.35* 11/29/2015   BUN 18 11/29/2015   NA 137 11/29/2015   K 4.3 11/29/2015   CL 102 11/29/2015   CO2 22 11/29/2015    Lab Results  Component Value Date   HGBA1C 6.2* 08/27/2015   Lipid Panel     Component Value Date/Time   CHOL 157 09/28/2015 1245   TRIG 64 09/28/2015 1245   HDL 68 09/28/2015 1245   CHOLHDL 2.3 09/28/2015 1245   VLDL 13 09/28/2015 1245   LDLCALC 76 09/28/2015 1245       Assessment and plan:   1. Essential hypertension  - COMPLETE METABOLIC PANEL WITH GFR - CBC with  Differential - Lipid panel   2. Colon cancer screening  - POC Hemoccult Bld/Stl (3-Cd Home Screen); Future  3. Need for Tdap vaccination - Tdap vaccine greater than or equal to 7yo IM  4. Weight Loss -ensure or other supplement between meals. -TSH     The patient was given clear instructions to go to ER or return to medical center if symptoms don't improve, worsen or new problems develop. The patient verbalized understanding.    Henrietta Hoover FNP  04/07/2016, 2:08 PM

## 2016-04-17 MED FILL — AMIODARONE HCL 200 MG TAB: 200 | 30 days supply | Qty: 30 | Fill #2

## 2016-04-17 MED FILL — ROSUVASTATIN CAL 20 MG TAB: 20 | 30 days supply | Qty: 30 | Fill #4

## 2016-04-21 ENCOUNTER — Telehealth (HOSPITAL_COMMUNITY): Payer: Self-pay

## 2016-04-21 NOTE — Telephone Encounter (Signed)
Patient's family member called CHF triage line concerned about labs from 5/22 ordered by [facility] that came back abnormal. Liver enzymes seem quite different from last set taken 4 months ago, and patient and family member are concerned. When advising patient to call office where labs were ordered from to address these abnormal results, patient's family member stated that they reached out to [facility] and were told to call CHF clinic to address this because nothing was prescribed to patient to cause changes in liver enzymes.  No mention of referral to GI or hepatologist from primary provider either. Patient and family feel as though this needs to be addressed but also feel that it is not being addressed at primary doctor's office and would like to see Dr. Gala Romney to follow up and address these lab values. Added on to next available opening next week.  [facility]

## 2016-04-24 ENCOUNTER — Other Ambulatory Visit (INDEPENDENT_AMBULATORY_CARE_PROVIDER_SITE_OTHER): Payer: Medicare Other

## 2016-04-24 DIAGNOSIS — Z1211 Encounter for screening for malignant neoplasm of colon: Secondary | ICD-10-CM | POA: Diagnosis not present

## 2016-04-24 LAB — POC HEMOCCULT BLD/STL (HOME/3-CARD/SCREEN)
CARD #2 DATE: 6052017
CARD #3 DATE: 6072017
Card #1 Date: 6042017
Card #2 Fecal Occult Blod, POC: NEGATIVE
FECAL OCCULT BLD: NEGATIVE
FECAL OCCULT BLD: NEGATIVE

## 2016-04-25 ENCOUNTER — Other Ambulatory Visit: Payer: Medicare Other

## 2016-04-29 ENCOUNTER — Ambulatory Visit (HOSPITAL_COMMUNITY)
Admission: RE | Admit: 2016-04-29 | Discharge: 2016-04-29 | Disposition: A | Discharge status: Home / Self Care | Payer: Medicare Other | Source: Ambulatory Visit | Attending: Internal Medicine | Admitting: Internal Medicine

## 2016-04-29 ENCOUNTER — Encounter (HOSPITAL_COMMUNITY): Payer: Self-pay | Admitting: Internal Medicine

## 2016-04-29 VITALS — BP 143/86 | HR 68 | Wt 88.8 lb

## 2016-04-29 DIAGNOSIS — I428 Other cardiomyopathies: Secondary | ICD-10-CM | POA: Insufficient documentation

## 2016-04-29 DIAGNOSIS — Z8249 Family history of ischemic heart disease and other diseases of the circulatory system: Secondary | ICD-10-CM | POA: Insufficient documentation

## 2016-04-29 DIAGNOSIS — I10 Essential (primary) hypertension: Secondary | ICD-10-CM

## 2016-04-29 DIAGNOSIS — Z8673 Personal history of transient ischemic attack (TIA), and cerebral infarction without residual deficits: Secondary | ICD-10-CM | POA: Insufficient documentation

## 2016-04-29 DIAGNOSIS — Z79899 Other long term (current) drug therapy: Secondary | ICD-10-CM | POA: Insufficient documentation

## 2016-04-29 DIAGNOSIS — R945 Abnormal results of liver function studies: Secondary | ICD-10-CM

## 2016-04-29 DIAGNOSIS — Z885 Allergy status to narcotic agent status: Secondary | ICD-10-CM | POA: Insufficient documentation

## 2016-04-29 DIAGNOSIS — I255 Ischemic cardiomyopathy: Secondary | ICD-10-CM | POA: Insufficient documentation

## 2016-04-29 DIAGNOSIS — I5022 Chronic systolic (congestive) heart failure: Secondary | ICD-10-CM | POA: Insufficient documentation

## 2016-04-29 DIAGNOSIS — I2510 Atherosclerotic heart disease of native coronary artery without angina pectoris: Secondary | ICD-10-CM | POA: Insufficient documentation

## 2016-04-29 DIAGNOSIS — Z7982 Long term (current) use of aspirin: Secondary | ICD-10-CM | POA: Insufficient documentation

## 2016-04-29 DIAGNOSIS — R7989 Other specified abnormal findings of blood chemistry: Secondary | ICD-10-CM

## 2016-04-29 DIAGNOSIS — E785 Hyperlipidemia, unspecified: Secondary | ICD-10-CM | POA: Insufficient documentation

## 2016-04-29 DIAGNOSIS — I471 Supraventricular tachycardia: Secondary | ICD-10-CM

## 2016-04-29 DIAGNOSIS — I110 Hypertensive heart disease with heart failure: Secondary | ICD-10-CM | POA: Insufficient documentation

## 2016-04-29 LAB — COMPREHENSIVE METABOLIC PANEL
ALT: 61 U/L — AB (ref 14–54)
ANION GAP: 7 (ref 5–15)
AST: 62 U/L — ABNORMAL HIGH (ref 15–41)
Albumin: 2.7 g/dL — ABNORMAL LOW (ref 3.5–5.0)
Alkaline Phosphatase: 43 U/L (ref 38–126)
BUN: 16 mg/dL (ref 6–20)
CALCIUM: 9.1 mg/dL (ref 8.9–10.3)
CHLORIDE: 106 mmol/L (ref 101–111)
CO2: 26 mmol/L (ref 22–32)
CREATININE: 1.28 mg/dL — AB (ref 0.44–1.00)
GFR, EST AFRICAN AMERICAN: 45 mL/min — AB (ref >60)
GFR, EST NON AFRICAN AMERICAN: 39 mL/min — AB (ref >60)
Glucose, Bld: 91 mg/dL (ref 65–99)
Potassium: 3.1 mmol/L — ABNORMAL LOW (ref 3.5–5.1)
Sodium: 139 mmol/L (ref 135–145)
Total Bilirubin: 0.5 mg/dL (ref 0.3–1.2)
Total Protein: 5.6 g/dL — ABNORMAL LOW (ref 6.5–8.1)

## 2016-04-29 MED FILL — BIDIL TABLET: 20-37.5 | 30 days supply | Qty: 135 | Fill #5

## 2016-04-29 NOTE — Progress Notes (Signed)
ADVANCED HF CLINIC NOTE  Patient ID: Julie Stein, female   DOB: 12-31-1934, 81 y.o.   MRN: [id number] Patient ID: Julie Stein, female   DOB: Mar 24, 1935, 81 y.o.   MRN: [id number]  PCP: [facility] Cardiologist: Dr Shirlee Latch  HPI: Julie Stein is a 81 y.o.  female with a history of SVT, HTN, CAD, mixed ischemic/nonischemic cardiomyopathy, CHF and stroke. She was admitted initially 08/23/15 with pulmonary edema. Echo on 10/9 revealed an EF of 20-25% with diffuse hypokinesis. It was thought she had a viral myocarditis or hypertensive cardiomyopathy. She developed transient weakness and was noted to have CVA by MRI.  She had R/LHC on 10/10 revealing normal to low filling pressures and normal cardiac output, moderate to severe ostial OM1 and severe distal LAD stenoses with plan for medical management. We thought her flash pulmonary edema and troponin elevation was likely from hypertensive crisis leading to demand ischemia. During this time, she was noted to have runs of symptomatic SVT.   Once stable, she was admitted to IP rehab, but then readmitted to hospital with syncope while on bedside commode requiring  CPR.  She had recurrent SVT noted when back on telemetry.  Syncope thought to be 2/2 rapid SVT versus vasovagal.  She had an EP study but SVT could not be triggered so no ablation was done.  Given further recurrences, she was started on amiodarone.  Echo 1/17, showing EF improved to 60-65%.    She seems to be doing well today.  Overall feels pretty well. No CP or SOB. Daughter is concerned about increases LFTs from 04/07/16 as well as lack of appetite.  AST 123 ALT 197. Bili normal. She did have a URI oat that time. BP ok.    Labs (10/16): K 3.7, creatinine 1.19, HCT 30.1 Labs (11/16): K 3.8, creatinine 1.35, LDL 76, HDL 68, TSH normal, LFTs normal Labs (1/17): K 4.3, creatinine 1.35, TSH normal, AST 32, ALT 30, HCT 32.4.  Labs (04/07/16):  AST 123 ALT 197. Bili normal   PMH 1.  Chronic systolic CHF: Mixed ischemic/nonischemic CMP.  Suspect long-standing HTN plays a major role.  Echo (10/16) with EF 20-25%, mild LVH, mild MR. Echo (1/17) with EF 60-65%, mild TR, severe LAE.  2. CAD:  LHC 10/16 with 20% proximal LAD, 40% mid LAD, 90% distal LAD, large high OM1 with 80% ostial stenosis => medically managed.  3. Hx of CVA: MRI head 08/28/15 showed small infarcts in watershed region.Pre-dated cardiac cath, likely related to HTN.  4. 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  Now on amiodarone given symptomatic frequent recurrences.   5. HTN:  Renal artery dopplers 08/30/15 did not show RAS.  6. Hyperlipidemia. 7. PFTs (11/16): suggestive of reactive airways disease like asthma.    Current Outpatient Prescriptions  Medication Sig Dispense Refill  . amiodarone (PACERONE) 200 MG tablet Take 0.5 tablets (100 mg total) by mouth daily. 30 tablet 3  . aspirin EC 81 MG EC tablet Take 1 tablet (81 mg total) by mouth daily. 30 tablet 0  . carvedilol (COREG) 6.25 MG tablet Take 3 tablets (18.75 mg total) by mouth 2 (two) times daily with a meal. 270 tablet 3  . co-enzyme Q-10 30 MG capsule Take 30 mg by mouth daily.    . isosorbide-hydrALAZINE (BIDIL) 20-37.5 MG tablet Take 1.5 tablets by mouth 3 (three) times daily. 405 tablet 3  . losartan (COZAAR) 50  MG tablet Take 1 tablet (50 mg total) by mouth daily. 90 tablet 3  . Multiple Vitamins-Minerals (MULTIVITAMIN) tablet Take 1 tablet by mouth daily.    . Omega-3 Fatty Acids (FISH OIL) 1000 MG CAPS Take 1,000 mg by mouth daily.    . rosuvastatin (CRESTOR) 20 MG tablet Take 1 tablet (20 mg total) by mouth daily. 30 tablet 6  . spironolactone (ALDACTONE) 25 MG tablet Take 1 tablet (25 mg total) by mouth daily. 90 tablet 3  . nitroGLYCERIN (NITROSTAT) 0.4 MG SL tablet Place 1 tablet (0.4 mg total) under the tongue every 5 (five) minutes as needed for chest pain. (Patient not  taking: Reported on 12/17/2015) 30 tablet 12   No current facility-administered medications for this encounter.    Allergies  Allergen Reactions  . Demerol [Meperidine]     intolerance      Social History   Social History  . Marital Status: Married    Spouse Name: N/A  . Number of Children: N/A  . Years of Education: N/A   Occupational History  . Not on file.   Social History Main Topics  . Smoking status: Never Smoker   . Smokeless tobacco: Not on file  . Alcohol Use: No  . Drug Use: No  . Sexual Activity: No   Other Topics Concern  . Not on file   Social History Narrative      Family History  Problem Relation Age of Onset  . Hypertension Daughter     Ceasar Mons Vitals:   04/29/16 1032  BP: 143/86  Pulse: 68  Weight: 88 lb 12 oz (40.257 kg)  SpO2: 100%    Wt Readings from Last 3 Encounters:  04/29/16 88 lb 12 oz (40.257 kg)  04/07/16 86 lb (39.009 kg)  12/17/15 100 lb 8 oz (45.587 kg)     PHYSICAL EXAM: General:  Well appearing. No respiratory difficulty HEENT: normal Neck: supple. no JVD. Carotids 2+ bilat; no bruits. No lymphadenopathy or thryomegaly appreciated. Cor: PMI nondisplaced. Regular rate & rhythm. No S3/S4. No rubs or murmurs. Lungs: CTA, normal effort Abdomen: soft, nontender, nondistended. No hepatosplenomegaly. No bruits or masses. Good bowel sounds. Extremities: no cyanosis, clubbing, rash, edema Neuro: alert & oriented x 3, cranial nerves grossly intact. moves all 4 extremities w/o difficulty. Affect pleasant.  ASSESSMENT & PLAN: 1. Chronic Systolic HF: Mixed ischemic/nonischemic CMP, suspect long-standing HTN played a large role. ECHO 10/16 EF 20-25% with mild LVH. Repeat echo t (1/17) was reviewed and showed EF improved back to normal range, 60-65%.  Euvolemic on exam.  NYHA class II.   - Continue current doses of Coreg, spironolactone, Bidil, and losartan 2. CAD: LHC 08/27/15 with 20% proximal LAD, 40% mid LAD, 90% distal LAD,  large high OM1 with 80% ostial stenosis, medically managed.  She has had no chest pain.  Good lipids 11/16.  - Contine ASA and statin.  3. CVA: MRI head 08/28/15 showed small infarcts in watershed region.Suspect related to HTN, neurologic symptoms preceded cath.  No residual deficit. 4. H/o SVT with syncope: No symptomatic recurrence. Unable to reproduce with EP study.  - Now on low dose amio 100 daily. LFTs recently elevatedecently.  TSH normal.  She will need regular eye exams.  - We discussed the options and the fact that the LFT elevation may or may not ne from meds (amio or statin) -- We discussed    1) Recheck LFTs if now normal continue current meds    2)  Stopping  amio completely and see if rhythm recurs    3) Stopping amio now and looking for AA medicine once amio is washed out    4) Consider lowering amio to 50 daily -At this point will recheck LFTS. If still up, will stop amio and have them come back in 2 weeks for recheck LFTS and discuss with Dr. Shirlee Latch. If normal, will continue amio and have them come back in 2 weeks to see if we can lower amio to 50 daily or change to another agent. They will hold amio for 3 days now and see if it improves her appetite 5. HTN: Mildly elevated here but 120s-130s systolic with home checks. Renal artery dopplers 08/30/15 did not show RAS.  - Continue current meds.  6. Hyperlipidemia: Continue statin. See discussion as above.   Arvilla Meres MD 04/29/2016

## 2016-04-29 NOTE — Addendum Note (Signed)
Encounter addended by: Modesta Messing, CMA on: 04/29/2016 11:45 AM<BR>     Documentation filed: [employer], Patient Instructions Section, Orders

## 2016-04-29 NOTE — Patient Instructions (Addendum)
Hold Amiodarone for 3 days (to see if appetite improves)  *resume after 3 days  Routine lab work today. Will notify you of abnormal results  Follow up in 2 weeks with Dr.McLean.

## 2016-05-05 ENCOUNTER — Telehealth (HOSPITAL_COMMUNITY): Payer: Self-pay | Admitting: *Deleted

## 2016-05-05 ENCOUNTER — Encounter (HOSPITAL_COMMUNITY): Payer: Self-pay | Admitting: *Deleted

## 2016-05-05 NOTE — Telephone Encounter (Signed)
Notes Recorded by Modesta Messing, CMA on 05/05/2016 at 4:31 PM Letter mailed. Notes Recorded by Modesta Messing, CMA on 05/01/2016 at 4:46 PM Tried pt again still no answer/no voicemail. Will try again tomorrow if no answer tomorrow letter will be mailed. Notes Recorded by Modesta Messing, CMA on 05/01/2016 at 2:18 PM Attempted to reach pt to give lab results... No answer/no voicemail. Will try patient again later Notes Recorded by Dolores Patty, MD on 04/30/2016 at 7:56 PM LFTs coming down. Please let them know. K is low. Give kcl 60 meq.

## 2016-05-06 MED FILL — LOSARTAN POTASSIUM 50 MG TA: 50 | 30 days supply | Qty: 30 | Fill #4

## 2016-05-06 MED FILL — CARVEDILOL 6.25 MG TABLET: 6.25 | 30 days supply | Qty: 180 | Fill #4

## 2016-05-06 MED FILL — SPIRONOLACTONE 25 MG TABLET: 25 | 30 days supply | Qty: 30 | Fill #4

## 2016-05-19 MED FILL — ROSUVASTATIN CAL 20 MG TAB: 20 | 30 days supply | Qty: 30 | Fill #5

## 2016-05-27 ENCOUNTER — Encounter (HOSPITAL_COMMUNITY): Payer: Self-pay

## 2016-05-27 ENCOUNTER — Ambulatory Visit (HOSPITAL_COMMUNITY)
Admission: RE | Admit: 2016-05-27 | Discharge: 2016-05-27 | Disposition: A | Discharge status: Home / Self Care | Payer: Medicare Other | Source: Ambulatory Visit | Attending: Cardiology | Admitting: Cardiology

## 2016-05-27 VITALS — BP 116/64 | HR 67 | Wt 87.8 lb

## 2016-05-27 DIAGNOSIS — I110 Hypertensive heart disease with heart failure: Secondary | ICD-10-CM | POA: Insufficient documentation

## 2016-05-27 DIAGNOSIS — I471 Supraventricular tachycardia: Secondary | ICD-10-CM

## 2016-05-27 DIAGNOSIS — Z7982 Long term (current) use of aspirin: Secondary | ICD-10-CM | POA: Insufficient documentation

## 2016-05-27 DIAGNOSIS — R55 Syncope and collapse: Secondary | ICD-10-CM | POA: Insufficient documentation

## 2016-05-27 DIAGNOSIS — I429 Cardiomyopathy, unspecified: Secondary | ICD-10-CM | POA: Insufficient documentation

## 2016-05-27 DIAGNOSIS — R740 Nonspecific elevation of levels of transaminase and lactic acid dehydrogenase [LDH]: Secondary | ICD-10-CM | POA: Insufficient documentation

## 2016-05-27 DIAGNOSIS — I2510 Atherosclerotic heart disease of native coronary artery without angina pectoris: Secondary | ICD-10-CM | POA: Insufficient documentation

## 2016-05-27 DIAGNOSIS — E785 Hyperlipidemia, unspecified: Secondary | ICD-10-CM | POA: Insufficient documentation

## 2016-05-27 DIAGNOSIS — R945 Abnormal results of liver function studies: Secondary | ICD-10-CM

## 2016-05-27 DIAGNOSIS — Z8673 Personal history of transient ischemic attack (TIA), and cerebral infarction without residual deficits: Secondary | ICD-10-CM | POA: Insufficient documentation

## 2016-05-27 DIAGNOSIS — I5022 Chronic systolic (congestive) heart failure: Secondary | ICD-10-CM | POA: Insufficient documentation

## 2016-05-27 DIAGNOSIS — R7989 Other specified abnormal findings of blood chemistry: Secondary | ICD-10-CM

## 2016-05-27 LAB — COMPREHENSIVE METABOLIC PANEL
ALBUMIN: 2.6 g/dL — AB (ref 3.5–5.0)
ALT: 34 U/L (ref 14–54)
ANION GAP: 7 (ref 5–15)
AST: 32 U/L (ref 15–41)
Alkaline Phosphatase: 47 U/L (ref 38–126)
BILIRUBIN TOTAL: 0.7 mg/dL (ref 0.3–1.2)
BUN: 17 mg/dL (ref 6–20)
CHLORIDE: 104 mmol/L (ref 101–111)
CO2: 26 mmol/L (ref 22–32)
Calcium: 9.2 mg/dL (ref 8.9–10.3)
Creatinine, Ser: 1.48 mg/dL — ABNORMAL HIGH (ref 0.44–1.00)
GFR calc Af Amer: 38 mL/min — ABNORMAL LOW (ref >60)
GFR, EST NON AFRICAN AMERICAN: 32 mL/min — AB (ref >60)
GLUCOSE: 93 mg/dL (ref 65–99)
POTASSIUM: 3.4 mmol/L — AB (ref 3.5–5.1)
Sodium: 137 mmol/L (ref 135–145)
Total Protein: 5.5 g/dL — ABNORMAL LOW (ref 6.5–8.1)

## 2016-05-27 LAB — TSH: TSH: 2.409 u[IU]/mL (ref 0.350–4.500)

## 2016-05-27 MED ORDER — AMIODARONE HCL 100 MG PO TABS: 50.0000 mg | ORAL_TABLET | Freq: Every day | ORAL | Status: DC

## 2016-05-27 MED ORDER — ISOSORB DINITRATE-HYDRALAZINE 20-37.5 MG PO TABS: 1.0000 | ORAL_TABLET | Freq: Three times a day (TID) | ORAL | Status: DC

## 2016-05-27 MED ORDER — POTASSIUM CHLORIDE ER 10 MEQ PO TBCR: 20.0000 meq | EXTENDED_RELEASE_TABLET | Freq: Every day | ORAL | Status: DC

## 2016-05-27 MED FILL — POTASSIUM CL 10 MEQ TAB SA: 10 | 30 days supply | Qty: 60 | Fill #0

## 2016-05-27 MED FILL — AMIODARONE HCL 100 MG TAB: 100 | 30 days supply | Qty: 15 | Fill #0

## 2016-05-27 NOTE — Patient Instructions (Signed)
Decrease Amiodarone to 50 mg daily, we have sent you in a prescription for 100 mg tablets cut those in half   Decrease Bidil to 1 tab Three times a day   Start Potassium 20 meq (2 tabs) daily  Labs today  We will contact you in 3 months to schedule your next appointment.

## 2016-05-27 NOTE — Progress Notes (Signed)
Patient ID: Julie Stein, female   DOB: 1935-10-17, 82 y.o.   MRN: [id number]   ADVANCED HF CLINIC NOTE  PCP: [facility] Primary Cardiologist: Dr Shirlee Latch  HPI: Julie Stein is a 81 y.o.  female with a history of SVT, HTN, CAD, mixed ischemic/nonischemic cardiomyopathy, CHF and stroke. She was admitted initially 08/23/15 with pulmonary edema. Echo on 10/9 revealed an EF of 20-25% with diffuse hypokinesis. It was thought she had a viral myocarditis or hypertensive cardiomyopathy. She developed transient weakness and was noted to have CVA by MRI.  She had R/LHC on 10/10 revealing normal to low filling pressures and normal cardiac output, moderate to severe ostial OM1 and severe distal LAD stenoses with plan for medical management. We thought her flash pulmonary edema and troponin elevation was likely from hypertensive crisis leading to demand ischemia. During this time, she was noted to have runs of symptomatic SVT.   Once stable, she was admitted to IP rehab, but then readmitted to hospital with syncope while on bedside commode requiring  CPR.  She had recurrent SVT noted when back on telemetry.  Syncope thought to be 2/2 rapid SVT versus vasovagal.  She had an EP study but SVT could not be triggered so no ablation was done.  Given further recurrences, she was started on amiodarone.  Echo 1/17 showed EF improved to 60-65%.    She had a significant rise in LFTs in 5/17, apparently had had a "bad cold" around this time.  Repeat LFTs in 6/17 still elevated but coming down.  Her weight has been generally trending down (1 lb down compared to prior).  Appetite is poor, she is drinking Ensure 2-3 times/day.  No tachypalpitations.  No lightheadedness.  No exertional dyspnea.  No chest pain.  She does fatigue easily.  SBP running in the 110s range.    Labs (10/16): K 3.7, creatinine 1.19, HCT 30.1 Labs (11/16): K 3.8, creatinine 1.35, LDL 76, HDL 68, TSH normal, LFTs normal Labs (1/17): K 4.3,  creatinine 1.35, TSH normal, AST 32, ALT 30, HCT 32.4.  Labs (04/07/16):  AST 123 ALT 197. Bili normal  Labs (6/17): AST 62, ALT 61, K 3.1, creatinine 1.28  PMH 1. Chronic systolic CHF: Mixed ischemic/nonischemic CMP.  Suspect long-standing HTN plays a major role.  Echo (10/16) with EF 20-25%, mild LVH, mild MR. Echo (1/17) with EF 60-65%, mild TR, severe LAE.  2. CAD:  LHC 10/16 with 20% proximal LAD, 40% mid LAD, 90% distal LAD, large high OM1 with 80% ostial stenosis => medically managed.  3. Hx of CVA: MRI head 08/28/15 showed small infarcts in watershed region.Pre-dated cardiac cath, likely related to HTN.  4. 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  Now on amiodarone given symptomatic frequent recurrences.   5. HTN:  Renal artery dopplers 08/30/15 did not show RAS.  6. Hyperlipidemia. 7. PFTs (11/16): suggestive of reactive airways disease like asthma.  8. Elevated LFTs: ?etiology.    Current Outpatient Prescriptions  Medication Sig Dispense Refill  . amiodarone (PACERONE) 100 MG tablet Take 0.5 tablets (50 mg total) by mouth daily. 15 tablet 3  . aspirin EC 81 MG EC tablet Take 1 tablet (81 mg total) by mouth daily. 30 tablet 0  . carvedilol (COREG) 6.25 MG tablet Take 3 tablets (18.75 mg total) by mouth 2 (two) times daily with a meal. 270 tablet 3  . co-enzyme Q-10 30 MG capsule Take  30 mg by mouth daily.    . isosorbide-hydrALAZINE (BIDIL) 20-37.5 MG tablet Take 1 tablet by mouth 3 (three) times daily. 405 tablet 3  . losartan (COZAAR) 50 MG tablet Take 1 tablet (50 mg total) by mouth daily. 90 tablet 3  . Multiple Vitamins-Minerals (MULTIVITAMIN) tablet Take 1 tablet by mouth daily.    . Omega-3 Fatty Acids (FISH OIL) 1000 MG CAPS Take 1,000 mg by mouth daily.    . rosuvastatin (CRESTOR) 20 MG tablet Take 1 tablet (20 mg total) by mouth daily. 30 tablet 6  . spironolactone (ALDACTONE) 25 MG tablet Take 1 tablet (25 mg  total) by mouth daily. 90 tablet 3  . nitroGLYCERIN (NITROSTAT) 0.4 MG SL tablet Place 1 tablet (0.4 mg total) under the tongue every 5 (five) minutes as needed for chest pain. (Patient not taking: Reported on 12/17/2015) 30 tablet 12  . potassium chloride (K-DUR) 10 MEQ tablet Take 2 tablets (20 mEq total) by mouth daily. 60 tablet 3   No current facility-administered medications for this encounter.    Allergies  Allergen Reactions  . Demerol [Meperidine]     intolerance      Social History   Social History  . Marital Status: Married    Spouse Name: N/A  . Number of Children: N/A  . Years of Education: N/A   Occupational History  . Not on file.   Social History Main Topics  . Smoking status: Never Smoker   . Smokeless tobacco: Not on file  . Alcohol Use: No  . Drug Use: No  . Sexual Activity: No   Other Topics Concern  . Not on file   Social History Narrative      Family History  Problem Relation Age of Onset  . Hypertension Daughter     Ceasar Mons Vitals:   05/27/16 1103  BP: 116/64  Pulse: 67  Weight: 87 lb 12 oz (39.803 kg)  SpO2: 100%    Wt Readings from Last 3 Encounters:  05/27/16 87 lb 12 oz (39.803 kg)  04/29/16 88 lb 12 oz (40.257 kg)  04/07/16 86 lb (39.009 kg)     PHYSICAL EXAM: General:  Thin, well appearing. No respiratory difficulty HEENT: normal Neck: supple. no JVD. Carotids 2+ bilat; no bruits. No lymphadenopathy or thryomegaly appreciated. Cor: PMI nondisplaced. Regular rate & rhythm. No S3/S4. No rubs or murmurs. Lungs: CTA, normal effort Abdomen: soft, nontender, nondistended. No hepatosplenomegaly. No bruits or masses. Good bowel sounds. Extremities: no cyanosis, clubbing, rash, edema Neuro: alert & oriented x 3, cranial nerves grossly intact. moves all 4 extremities w/o difficulty. Affect pleasant.  ASSESSMENT & PLAN: 1. Chronic Systolic HF: Mixed ischemic/nonischemic CMP, suspect long-standing HTN played a large role. ECHO 10/16  EF 20-25% with mild LVH. Repeat echo (1/17) showed EF improved back to normal range, 60-65%.  Euvolemic on exam.  NYHA class II.   - Continue current doses of Coreg, spironolactone, losartan. - Can drop Bidil dose to 1 tab tid.  - Add KCl 20 with low K.  2. CAD: LHC 08/27/15 with 20% proximal LAD, 40% mid LAD, 90% distal LAD, large high OM1 with 80% ostial stenosis, medically managed.  She has had no chest pain. Good lipids 11/16.  - Continue ASA 81 and statin.  3. CVA: MRI head 08/28/15 showed small infarcts in watershed region.Suspect related to HTN, neurologic symptoms preceded cath.  No residual deficit. 4. H/o SVT with syncope: No symptomatic recurrence. Unable to reproduce with EP study.  -  Now on low dose amio 100 daily. LFTs recently elevated recently.  TSH normal.  She will need regular eye exams. Will decrease amiodarone to 50 mg daily.  5. HTN: Well-controlled now. Renal artery dopplers 08/30/15 did not show RAS.  6. Elevated transaminases: Noted in 5/17, trending down in 6/17. ?Due to a viral syndrome as she reports a "bad cold" around the time when the LFTs were noted to have risen.  - I am decreasing amiodarone to 50 mg daily today.  - If LFTs remains elevated today, would stop amioadrone altogether and check abdominal US as well as hepatitis serologies.   7. Weight loss, fatigue: I asked her to check with her PCP to make sure she has had all her age-appropriate cancer screening.   Marca Ancona MD 05/27/2016

## 2016-05-30 ENCOUNTER — Telehealth (HOSPITAL_COMMUNITY): Payer: Self-pay | Admitting: *Deleted

## 2016-05-30 NOTE — Telephone Encounter (Signed)
Notes Recorded by Modesta Messing, CMA on 05/30/2016 at 11:46 AM Pt aware of results and medication change pt added to the lab schedule 7/28 Notes Recorded by Laurey Morale, MD on 05/30/2016 at 12:32 AM LFTs back to normal, good news. K low, would add KCl 20 mEq daily to her regimen. BMET in 2 wks. Notes Recorded by Noralee Space, RN on 05/27/2016 at 4:19 PM Labs reviewed by RN, will forward to MD for review

## 2016-06-02 MED FILL — ?SPIRONOLACTONE 25 MG TABLE: 25 | 30 days supply | Qty: 30 | Fill #5

## 2016-06-03 ENCOUNTER — Telehealth: Payer: Self-pay | Admitting: Cardiology

## 2016-06-03 ENCOUNTER — Other Ambulatory Visit: Payer: Self-pay | Admitting: *Deleted

## 2016-06-03 DIAGNOSIS — I5022 Chronic systolic (congestive) heart failure: Secondary | ICD-10-CM

## 2016-06-03 MED ORDER — ISOSORB DINITRATE-HYDRALAZINE 20-37.5 MG PO TABS: 1.0000 | ORAL_TABLET | Freq: Three times a day (TID) | ORAL | Status: DC

## 2016-06-03 NOTE — Telephone Encounter (Signed)
[facility] 

## 2016-06-03 NOTE — Telephone Encounter (Signed)
Can use hydralazine 37.5 mg tid + Imdur 60, then restart Bidil when it is available.

## 2016-06-03 NOTE — Telephone Encounter (Signed)
New Message:      Bidil is on back order,does he want to prescribe something else until the Bidil comes in?

## 2016-06-04 MED ORDER — HYDRALAZINE HCL 25 MG PO TABS: 37.5000 mg | ORAL_TABLET | Freq: Three times a day (TID) | ORAL | Status: DC

## 2016-06-04 MED ORDER — ISOSORBIDE MONONITRATE ER 60 MG PO TB24: 60.0000 mg | ORAL_TABLET | Freq: Every day | ORAL | Status: DC

## 2016-06-04 MED FILL — hydrALAZINE HCL 25 MG TABS: 25 | 30 days supply | Qty: 135 | Fill #0

## 2016-06-04 MED FILL — ISOSORBIDE MN ER 60 MG TAB: 60 | 30 days supply | Qty: 30 | Fill #0

## 2016-06-04 NOTE — Telephone Encounter (Signed)
Spoke w/pt's daughter Burna Mortimer, she is aware new rx for Hydralazine and Imdur have been sent in for pt

## 2016-06-04 NOTE — Telephone Encounter (Signed)
I haven't heard about a Bidil shortage, Cicero Duck do you want to f/u on this?

## 2016-06-04 NOTE — Telephone Encounter (Signed)
Called [facility] and [facility] who both verified that Bidil is on backorder and there is no information on when it might be able to get some. Have emailed our rep for more information. Have sent in new Rx's for the separate components for Julie Stein in the meantime.   Tyler Deis. Bonnye Fava, PharmD, BCPS, CPP Clinical Pharmacist Pager: [phone] Phone: [phone] 06/04/2016 10:59 AM

## 2016-06-05 MED FILL — LOSARTAN POTASSIUM 50 MG TA: 50 | 30 days supply | Qty: 30 | Fill #5

## 2016-06-05 MED FILL — CARVEDILOL 6.25 MG TABLET: 6.25 | 30 days supply | Qty: 180 | Fill #5

## 2016-06-13 ENCOUNTER — Ambulatory Visit (HOSPITAL_COMMUNITY)
Admission: RE | Admit: 2016-06-13 | Discharge: 2016-06-13 | Disposition: A | Discharge status: Home / Self Care | Payer: Self-pay | Source: Ambulatory Visit | Attending: Cardiology | Admitting: Cardiology

## 2016-06-13 DIAGNOSIS — I5022 Chronic systolic (congestive) heart failure: Secondary | ICD-10-CM | POA: Insufficient documentation

## 2016-06-13 LAB — BASIC METABOLIC PANEL
ANION GAP: 6 (ref 5–15)
BUN: 22 mg/dL — AB (ref 6–20)
CO2: 24 mmol/L (ref 22–32)
Calcium: 9.5 mg/dL (ref 8.9–10.3)
Chloride: 106 mmol/L (ref 101–111)
Creatinine, Ser: 1.71 mg/dL — ABNORMAL HIGH (ref 0.44–1.00)
GFR, EST AFRICAN AMERICAN: 32 mL/min — AB (ref >60)
GFR, EST NON AFRICAN AMERICAN: 27 mL/min — AB (ref >60)
Glucose, Bld: 83 mg/dL (ref 65–99)
POTASSIUM: 4.4 mmol/L (ref 3.5–5.1)
SODIUM: 136 mmol/L (ref 135–145)

## 2016-06-16 ENCOUNTER — Telehealth (HOSPITAL_COMMUNITY): Payer: Self-pay | Admitting: *Deleted

## 2016-06-16 NOTE — Telephone Encounter (Signed)
Notes Recorded by Modesta Messing, CMA on 06/16/2016 at 2:07 PM EDT Pt aware of lab results. She will call back to schedule lab visit when she checks with transportation. ------  Notes Recorded by Laurey Morale, MD on 06/14/2016 at 10:19 PM EDT Creatinine is up. Try to stay well-hydrated and repeat BMET in 10 days.    Ref Range & Units 3d ago 2wk ago 93mo ago   Sodium 135 - 145 mmol/L 136 137 139   Potassium 3.5 - 5.1 mmol/L 4.4 3.4  3.1    Chloride 101 - 111 mmol/L 106 104 106   CO2 22 - 32 mmol/L 24 26 26    Glucose, Bld 65 - 99 mg/dL 83 93 91   BUN 6 - 20 mg/dL 22  17 16    Creatinine, Ser 0.44 - 1.00 mg/dL 8.85  0.27  7.41    Calcium 8.9 - 10.3 mg/dL 9.5 9.2 9.1   GFR calc non Af Amer >60 mL/min 27  32  39    GFR calc Af Amer >60 mL/min 32  38CM  45CM

## 2016-06-23 ENCOUNTER — Ambulatory Visit (HOSPITAL_COMMUNITY)
Admission: RE | Admit: 2016-06-23 | Discharge: 2016-06-23 | Disposition: A | Discharge status: Home / Self Care | Payer: Self-pay | Source: Ambulatory Visit | Attending: Internal Medicine | Admitting: Internal Medicine

## 2016-06-23 VITALS — BP 128/70

## 2016-06-23 DIAGNOSIS — I5022 Chronic systolic (congestive) heart failure: Secondary | ICD-10-CM | POA: Insufficient documentation

## 2016-06-23 LAB — BASIC METABOLIC PANEL
Anion gap: 6 (ref 5–15)
BUN: 21 mg/dL — AB (ref 6–20)
CO2: 24 mmol/L (ref 22–32)
CREATININE: 1.88 mg/dL — AB (ref 0.44–1.00)
Calcium: 9.4 mg/dL (ref 8.9–10.3)
Chloride: 106 mmol/L (ref 101–111)
GFR calc Af Amer: 28 mL/min — ABNORMAL LOW (ref >60)
GFR, EST NON AFRICAN AMERICAN: 24 mL/min — AB (ref >60)
GLUCOSE: 95 mg/dL (ref 65–99)
Potassium: 3.8 mmol/L (ref 3.5–5.1)
Sodium: 136 mmol/L (ref 135–145)

## 2016-06-23 MED FILL — ROSUVASTATIN CAL 20 MG TAB: 20 | 30 days supply | Qty: 30 | Fill #6

## 2016-06-23 MED FILL — AMIODARONE HCL 100 MG TAB: 100 | 30 days supply | Qty: 15 | Fill #1

## 2016-06-23 NOTE — Progress Notes (Signed)
Patient requested a b/p during lab appt today B/p 128 /70 Patient reports b/p readings at home have been very low (unable to tell me actual readings). Advised b/p is within normal range today, should continue current medications, keep b/p log and bring with her to next appt to discuss further with provider. Also advised that some cardiology meds can cause b/p to drop lower- advised if she becomes symptomatic (light headed, dizzy, increased fatigue, HA, or blurry vision) in anyway should give office a call for further instructions. Pt and patient family member voiced understanding    ALSO, patient requests something to help increase appetite

## 2016-06-25 ENCOUNTER — Telehealth (HOSPITAL_COMMUNITY): Payer: Self-pay | Admitting: Cardiology

## 2016-06-25 DIAGNOSIS — I509 Heart failure, unspecified: Secondary | ICD-10-CM

## 2016-06-25 MED ORDER — LOSARTAN POTASSIUM 50 MG PO TABS: 25.0000 mg | ORAL_TABLET | Freq: Every day | ORAL | 3 refills | Status: DC

## 2016-06-25 NOTE — Telephone Encounter (Signed)
-----   Message from Laurey Morale, MD sent at 06/25/2016  8:47 AM EDT ----- Creatinine a little higher.  Have her decrease losartan to 25 mg daily.  Check BP daily after the change.  Repeat BMET in 10 days and have her turn in BP readings when she comes for labs in 10 days.

## 2016-06-25 NOTE — Telephone Encounter (Signed)
Patient aware. And voiced understanding. Repeat labs and b/p check 8/18

## 2016-07-02 MED FILL — ?SPIRONOLACTONE 25 MG TABLE: 25 | 30 days supply | Qty: 30 | Fill #6

## 2016-07-02 MED FILL — ISOSORBIDE MN ER 60 MG TAB: 60 | 30 days supply | Qty: 30 | Fill #1

## 2016-07-04 ENCOUNTER — Ambulatory Visit (HOSPITAL_COMMUNITY)
Admission: RE | Admit: 2016-07-04 | Discharge: 2016-07-04 | Disposition: A | Discharge status: Home / Self Care | Payer: Self-pay | Source: Ambulatory Visit | Attending: Cardiology | Admitting: Cardiology

## 2016-07-04 DIAGNOSIS — I509 Heart failure, unspecified: Secondary | ICD-10-CM

## 2016-07-04 LAB — BASIC METABOLIC PANEL
ANION GAP: 9 (ref 5–15)
BUN: 21 mg/dL — ABNORMAL HIGH (ref 6–20)
CALCIUM: 9.7 mg/dL (ref 8.9–10.3)
CO2: 20 mmol/L — AB (ref 22–32)
CREATININE: 1.5 mg/dL — AB (ref 0.44–1.00)
Chloride: 109 mmol/L (ref 101–111)
GFR, EST AFRICAN AMERICAN: 37 mL/min — AB (ref >60)
GFR, EST NON AFRICAN AMERICAN: 32 mL/min — AB (ref >60)
Glucose, Bld: 93 mg/dL (ref 65–99)
Potassium: 4.1 mmol/L (ref 3.5–5.1)
SODIUM: 138 mmol/L (ref 135–145)

## 2016-07-14 ENCOUNTER — Other Ambulatory Visit (HOSPITAL_COMMUNITY): Payer: Self-pay | Admitting: Cardiology

## 2016-07-14 MED FILL — CARVEDILOL 6.25 MG TABLET: 6.25 | 45 days supply | Qty: 270 | Fill #0

## 2016-07-15 ENCOUNTER — Other Ambulatory Visit (HOSPITAL_COMMUNITY): Payer: Self-pay | Admitting: *Deleted

## 2016-07-15 MED ORDER — CARVEDILOL 6.25 MG PO TABS: 18.7500 mg | ORAL_TABLET | Freq: Two times a day (BID) | ORAL | 3 refills | Status: DC

## 2016-07-16 ENCOUNTER — Other Ambulatory Visit: Payer: Self-pay | Admitting: Cardiology

## 2016-07-17 ENCOUNTER — Telehealth: Payer: Self-pay | Admitting: Cardiology

## 2016-07-17 ENCOUNTER — Other Ambulatory Visit: Payer: Self-pay | Admitting: Cardiology

## 2016-07-17 ENCOUNTER — Other Ambulatory Visit (HOSPITAL_COMMUNITY): Payer: Self-pay | Admitting: Cardiology

## 2016-07-17 MED ORDER — HYDRALAZINE HCL 25 MG PO TABS: 37.5000 mg | ORAL_TABLET | Freq: Three times a day (TID) | ORAL | 3 refills | Status: DC

## 2016-07-17 MED FILL — hydrALAZINE HCL 25 MG TABS: 25 | 30 days supply | Qty: 135 | Fill #0

## 2016-07-17 NOTE — Telephone Encounter (Signed)
PT SENT MY CHART MESSAGE TO SCHEDULING, TO GET PRESCRIPTION TO HELP WITH HER WEIGHT LOSS-PLS CALL

## 2016-07-22 NOTE — Telephone Encounter (Signed)
Have attempted to reach patient on several occassions - unsuccessfully.  Will close this encounter.

## 2016-07-22 NOTE — Telephone Encounter (Signed)
Attempted to reach patient one more time -- with success!!! She meant for this to go to her PCP whom she sees later this week. She thanks me for checking with her.

## 2016-07-28 ENCOUNTER — Ambulatory Visit: Payer: Medicare Other | Admitting: Family Medicine

## 2016-08-07 MED FILL — ISOSORBIDE MN ER 60 MG TAB: 60 | 30 days supply | Qty: 30 | Fill #2

## 2016-08-07 MED FILL — ?SPIRONOLACTONE 25 MG TABLE: 25 MG | 30 days supply | Qty: 30 | Fill #7

## 2016-08-14 ENCOUNTER — Ambulatory Visit (HOSPITAL_COMMUNITY)
Admission: RE | Admit: 2016-08-14 | Discharge: 2016-08-14 | Disposition: A | Discharge status: Home / Self Care | Payer: Self-pay | Source: Ambulatory Visit | Attending: Cardiology | Admitting: Cardiology

## 2016-08-14 ENCOUNTER — Encounter (HOSPITAL_COMMUNITY): Payer: Self-pay

## 2016-08-14 ENCOUNTER — Other Ambulatory Visit: Payer: Self-pay | Admitting: Family Medicine

## 2016-08-14 VITALS — BP 118/72 | HR 72 | Wt 79.2 lb

## 2016-08-14 DIAGNOSIS — Z1231 Encounter for screening mammogram for malignant neoplasm of breast: Secondary | ICD-10-CM

## 2016-08-14 DIAGNOSIS — R7989 Other specified abnormal findings of blood chemistry: Secondary | ICD-10-CM

## 2016-08-14 DIAGNOSIS — R945 Abnormal results of liver function studies: Secondary | ICD-10-CM

## 2016-08-14 DIAGNOSIS — I5022 Chronic systolic (congestive) heart failure: Secondary | ICD-10-CM | POA: Insufficient documentation

## 2016-08-14 DIAGNOSIS — Z8673 Personal history of transient ischemic attack (TIA), and cerebral infarction without residual deficits: Secondary | ICD-10-CM

## 2016-08-14 DIAGNOSIS — I471 Supraventricular tachycardia: Secondary | ICD-10-CM

## 2016-08-14 LAB — CBC
HCT: 26.9 % — ABNORMAL LOW (ref 36.0–46.0)
Hemoglobin: 8.2 g/dL — ABNORMAL LOW (ref 12.0–15.0)
MCH: 24.3 pg — ABNORMAL LOW (ref 26.0–34.0)
MCHC: 30.5 g/dL (ref 30.0–36.0)
MCV: 79.6 fL (ref 78.0–100.0)
PLATELETS: 279 10*3/uL (ref 150–400)
RBC: 3.38 MIL/uL — ABNORMAL LOW (ref 3.87–5.11)
RDW: 18.3 % — AB (ref 11.5–15.5)
WBC: 3.9 10*3/uL — AB (ref 4.0–10.5)

## 2016-08-14 LAB — COMPREHENSIVE METABOLIC PANEL
ALK PHOS: 31 U/L — AB (ref 38–126)
ALT: 36 U/L (ref 14–54)
AST: 30 U/L (ref 15–41)
Albumin: 2.8 g/dL — ABNORMAL LOW (ref 3.5–5.0)
Anion gap: 9 (ref 5–15)
BILIRUBIN TOTAL: 0.5 mg/dL (ref 0.3–1.2)
BUN: 17 mg/dL (ref 6–20)
CALCIUM: 9.7 mg/dL (ref 8.9–10.3)
CHLORIDE: 107 mmol/L (ref 101–111)
CO2: 24 mmol/L (ref 22–32)
CREATININE: 0.94 mg/dL (ref 0.44–1.00)
GFR calc Af Amer: >60 mL/min (ref >60)
GFR, EST NON AFRICAN AMERICAN: 56 mL/min — AB (ref >60)
Glucose, Bld: 94 mg/dL (ref 65–99)
Potassium: 3.4 mmol/L — ABNORMAL LOW (ref 3.5–5.1)
Sodium: 140 mmol/L (ref 135–145)
TOTAL PROTEIN: 5.1 g/dL — AB (ref 6.5–8.1)

## 2016-08-14 MED ORDER — HYDRALAZINE HCL 25 MG PO TABS: 25.0000 mg | ORAL_TABLET | Freq: Three times a day (TID) | ORAL | 3 refills | Status: DC

## 2016-08-14 NOTE — Patient Instructions (Signed)
STOP Amiodarone.  DECREASE Hydralazine to 25 mg (1 tablet) three times daily.  Routine lab work today. Will notify you of abnormal results, otherwise no news is good news!  Please schedule mammogram with your OBGYN.  Follow up 6 months with Dr. Shirlee Latch.  Do the following things EVERYDAY: 1) Weigh yourself in the morning before breakfast. Write it down and keep it in a log. 2) Take your medicines as prescribed 3) Eat low salt foods-Limit salt (sodium) to 2000 mg per day.  4) Stay as active as you can everyday 5) Limit all fluids for the day to less than 2 liters

## 2016-08-16 NOTE — Progress Notes (Signed)
Patient ID: Julie Stein, female   DOB: [birth year], 81 y.o.   MRN: [id number]   PCP: Concepcion LivingLinda Bernhardt Primary Cardiologist: Dr Shirlee LatchMcLean  HPI: Julie Stein is a 81 y.o.  female with a history of SVT, HTN, CAD, mixed ischemic/nonischemic cardiomyopathy, CHF and stroke. She was admitted initially 08/23/15 with pulmonary edema. Echo on 10/9 revealed an EF of 20-25% with diffuse hypokinesis. It was thought she had a viral myocarditis or hypertensive cardiomyopathy. She developed transient weakness and was noted to have CVA by MRI.  She had R/LHC on 10/10 revealing normal to low filling pressures and normal cardiac output, moderate to severe ostial OM1 and severe distal LAD stenoses with plan for medical management. We thought her flash pulmonary edema and troponin elevation was likely from hypertensive crisis leading to demand ischemia. During this time, she was noted to have runs of symptomatic SVT.   Once stable, she was admitted to IP rehab, but then readmitted to hospital with syncope while on bedside commode requiring  CPR.  She had recurrent SVT noted when back on telemetry.  Syncope thought to be 2/2 rapid SVT versus vasovagal.  She had an EP study but SVT could not be triggered so no ablation was done.  Given further recurrences, she was started on amiodarone.  Echo 1/17 showed EF improved to 60-65%.    She had a significant rise in LFTs in 5/17, apparently had had a "bad cold" around this time.  LFTs were normal by 7/17.  Her weight has continued to fall, down another 8 lbs . Generally weak with poor energy.  No dyspnea or chest pain.  She has been drinking Boost twice a day but is still losing weight with poor appetite.  No BRBPR or melena.  FOBT negative recently.  No abdominal pain, no nausea/vomiting.     Labs (10/16): K 3.7, creatinine 1.19, HCT 30.1Labs (11/16): K 3.8, creatinine 1.35, LDL 76, HDL 68, TSH normal, LFTs normal Labs (1/17): K 4.3, creatinine 1.35, TSH normal, AST 32, ALT 30,  HCT 32.4.  Labs (04/07/16):  AST 123 ALT 197. Bili normal  Labs (6/17): AST 62, ALT 61, K 3.1, creatinine 1.28 Labs (7/17): LFTs normal, K 3.4, creatinine 1.48, TSH normal Labs (8/17): K 4.1, creatinine 1.5  PMH 1. Chronic systolic CHF: Mixed ischemic/nonischemic CMP.  Suspect long-standing HTN plays a major role.  Echo (10/16) with EF 20-25%, mild LVH, mild MR. Echo (1/17) with EF 60-65%, mild TR, severe LAE.  2. CAD:  LHC 10/16 with 20% proximal LAD, 40% mid LAD, 90% distal LAD, large high OM1 with 80% ostial stenosis => medically managed.  3. Hx of CVA: MRI head 08/28/15 showed small infarcts in watershed region.Pre-dated cardiac cath, likely related to HTN.  4. 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  Now on amiodarone given symptomatic frequent recurrences.   5. HTN:  Renal artery dopplers 08/30/15 did not show RAS.  6. Hyperlipidemia. 7. PFTs (11/16): suggestive of reactive airways disease like asthma.  8. Elevated LFTs: ?etiology.    Current Outpatient Prescriptions  Medication Sig Dispense Refill  . aspirin EC 81 MG EC tablet Take 1 tablet (81 mg total) by mouth daily. 30 tablet 0  . carvedilol (COREG) 6.25 MG tablet Take 3 tablets (18.75 mg total) by mouth 2 (two) times daily with a meal. 270 tablet 3  . co-enzyme Q-10 30 MG capsule Take 30 mg by mouth daily.    .Marland Kitchen  hydrALAZINE (APRESOLINE) 25 MG tablet Take 1 tablet (25 mg total) by mouth 3 (three) times daily. 90 tablet 3  . isosorbide mononitrate (IMDUR) 60 MG 24 hr tablet Take 1 tablet (60 mg total) by mouth daily. 30 tablet 3  . losartan (COZAAR) 50 MG tablet Take 0.5 tablets (25 mg total) by mouth daily. 90 tablet 3  . Multiple Vitamins-Minerals (MULTIVITAMIN) tablet Take 1 tablet by mouth daily.    . Omega-3 Fatty Acids (FISH OIL) 1000 MG CAPS Take 1,000 mg by mouth daily.    . potassium chloride (K-DUR) 10 MEQ tablet Take 2 tablets (20 mEq total) by mouth daily.  60 tablet 3  . rosuvastatin (CRESTOR) 20 MG tablet Take 1 tablet (20 mg total) by mouth daily. 30 tablet 6  . spironolactone (ALDACTONE) 25 MG tablet Take 1 tablet (25 mg total) by mouth daily. 90 tablet 3  . nitroGLYCERIN (NITROSTAT) 0.4 MG SL tablet Place 1 tablet (0.4 mg total) under the tongue every 5 (five) minutes as needed for chest pain. (Patient not taking: Reported on 08/14/2016) 30 tablet 12   No current facility-administered medications for this encounter.     Allergies  Allergen Reactions  . Demerol [Meperidine]     intolerance      Social History   Social History  . Marital status: Married    Spouse name: N/A  . Number of children: N/A  . Years of education: N/A   Occupational History  . Not on file.   Social History Main Topics  . Smoking status: Never Smoker  . Smokeless tobacco: Not on file  . Alcohol use No  . Drug use: No  . Sexual activity: No   Other Topics Concern  . Not on file   Social History Narrative  . No narrative on file      Family History  Problem Relation Age of Onset  . Hypertension Daughter     Vitals:   08/14/16 1054  BP: 118/72  Pulse: 72  SpO2: 99%  Weight: 79 lb 4 oz (35.9 kg)    Wt Readings from Last 3 Encounters:  08/14/16 79 lb 4 oz (35.9 kg)  05/27/16 87 lb 12 oz (39.8 kg)  04/29/16 88 lb 12 oz (40.3 kg)     PHYSICAL EXAM: General:  Thin, NAD HEENT: normal Neck: supple. no JVD. Carotids 2+ bilat; no bruits. No lymphadenopathy or thryomegaly appreciated. Cor: PMI nondisplaced. Regular rate & rhythm. No S3/S4. No rubs or murmurs. Lungs: CTA, normal effort Abdomen: soft, nontender, nondistended. No hepatosplenomegaly. No bruits or masses. Good bowel sounds. Extremities: no cyanosis, clubbing, rash, edema Neuro: alert & oriented x 3, cranial nerves grossly intact. moves all 4 extremities w/o difficulty. Affect pleasant.  ASSESSMENT & PLAN: 1. Chronic Systolic HF: Mixed ischemic/nonischemic CMP, suspect  long-standing HTN played a large role. ECHO 10/16 EF 20-25% with mild LVH. Repeat echo (1/17) showed EF improved back to normal range, 60-65%.  Euvolemic on exam.  NYHA class II.   - Continue current doses of Coreg, spironolactone, losartan. - Decrease hydralazine to 25 mg tid, continue current Imdur.   2. CAD: LHC 08/27/15 with 20% proximal LAD, 40% mid LAD, 90% distal LAD, large high OM1 with 80% ostial stenosis, medically managed.  She has had no chest pain. Good lipids 11/16.  - Continue ASA 81 and statin.  3. CVA: MRI head 08/28/15 showed small infarcts in watershed region.Suspect related to HTN, neurologic symptoms preceded cath.  No residual deficit. 4. H/o SVT  with syncope: No symptomatic recurrence. Unable to reproduce with EP study.  - I am going to stop amiodarone to see if this helps her appetite.  5. HTN: Well-controlled now. Renal artery dopplers 08/30/15 did not show RAS.  6. Elevated LFTs: Resolved.  7. Weight loss, fatigue: Recent FOBT negative, no localizing symptoms to suggest cancer.  I think she should have a screening mammogram (has not had one for years).  Stopping amiodarone could help appetite.  Needs to followup with PCP.   Marca Ancona MD 08/16/2016

## 2016-08-20 ENCOUNTER — Ambulatory Visit: Payer: Medicare Other

## 2016-08-25 ENCOUNTER — Other Ambulatory Visit (HOSPITAL_COMMUNITY): Payer: Self-pay | Admitting: *Deleted

## 2016-08-25 MED ORDER — ROSUVASTATIN CALCIUM 20 MG PO TABS: 20.0000 mg | ORAL_TABLET | Freq: Every day | ORAL | 6 refills | Status: DC

## 2016-08-25 MED ORDER — LOSARTAN POTASSIUM 50 MG PO TABS: 25.0000 mg | ORAL_TABLET | Freq: Every day | ORAL | 3 refills | Status: DC

## 2016-08-25 MED FILL — ROSUVASTATIN CALCIUM 20 MG: 20 | 30 days supply | Qty: 30 | Fill #0

## 2016-08-25 MED FILL — LOSARTAN POTASSIUM 50 MG TA: 50 | 30 days supply | Qty: 15 | Fill #0

## 2016-09-09 ENCOUNTER — Ambulatory Visit (INDEPENDENT_AMBULATORY_CARE_PROVIDER_SITE_OTHER): Payer: Medicaid Other | Admitting: Family Medicine

## 2016-09-09 ENCOUNTER — Encounter: Payer: Self-pay | Admitting: Family Medicine

## 2016-09-09 VITALS — BP 113/66 | HR 74 | Temp 98.1°F | Resp 14 | Ht 59.0 in | Wt 80.0 lb

## 2016-09-09 DIAGNOSIS — Z131 Encounter for screening for diabetes mellitus: Secondary | ICD-10-CM | POA: Diagnosis not present

## 2016-09-09 DIAGNOSIS — R634 Abnormal weight loss: Secondary | ICD-10-CM | POA: Diagnosis not present

## 2016-09-09 DIAGNOSIS — Z13220 Encounter for screening for lipoid disorders: Secondary | ICD-10-CM | POA: Diagnosis not present

## 2016-09-09 DIAGNOSIS — N181 Chronic kidney disease, stage 1: Secondary | ICD-10-CM

## 2016-09-09 DIAGNOSIS — I25118 Atherosclerotic heart disease of native coronary artery with other forms of angina pectoris: Secondary | ICD-10-CM | POA: Diagnosis not present

## 2016-09-09 DIAGNOSIS — E440 Moderate protein-calorie malnutrition: Secondary | ICD-10-CM | POA: Diagnosis not present

## 2016-09-09 DIAGNOSIS — D649 Anemia, unspecified: Secondary | ICD-10-CM

## 2016-09-09 DIAGNOSIS — M8580 Other specified disorders of bone density and structure, unspecified site: Secondary | ICD-10-CM

## 2016-09-09 LAB — CBC WITH DIFFERENTIAL/PLATELET
BASOS ABS: 0 {cells}/uL (ref 0–200)
BASOS PCT: 0 %
EOS ABS: 96 {cells}/uL (ref 15–500)
Eosinophils Relative: 2 %
HEMATOCRIT: 27.1 % — AB (ref 35.0–45.0)
Hemoglobin: 8.3 g/dL — ABNORMAL LOW (ref 11.7–15.5)
LYMPHS PCT: 23 %
Lymphs Abs: 1104 cells/uL (ref 850–3900)
MCH: 25.1 pg — AB (ref 27.0–33.0)
MCHC: 30.6 g/dL — AB (ref 32.0–36.0)
MCV: 81.9 fL (ref 80.0–100.0)
MONO ABS: 432 {cells}/uL (ref 200–950)
MONOS PCT: 9 %
MPV: 10.7 fL (ref 7.5–12.5)
NEUTROS PCT: 66 %
Neutro Abs: 3168 cells/uL (ref 1500–7800)
Platelets: 342 10*3/uL (ref 140–400)
RBC: 3.31 MIL/uL — ABNORMAL LOW (ref 3.80–5.10)
RDW: 19.6 % — AB (ref 11.0–15.0)
WBC: 4.8 10*3/uL (ref 3.8–10.8)

## 2016-09-09 LAB — ANEMIA PANEL
%SAT: 26 % (ref 11–50)
ABS RETIC: 62890 {cells}/uL (ref 20000–80000)
Ferritin: 50 ng/mL (ref 20–288)
Folate: 6.4 ng/mL (ref >5.4)
IRON: 40 ug/dL — AB (ref 45–160)
RBC.: 3.31 MIL/uL — ABNORMAL LOW (ref 3.80–5.10)
Retic Ct Pct: 1.9 %
TIBC: 154 ug/dL — AB (ref 250–450)
UIBC: 114 ug/dL — AB (ref 125–400)
VITAMIN B 12: 699 pg/mL (ref 200–1100)

## 2016-09-09 LAB — COMPLETE METABOLIC PANEL WITH GFR
ALT: 39 U/L — AB (ref 6–29)
AST: 37 U/L — ABNORMAL HIGH (ref 10–35)
Albumin: 2.7 g/dL — ABNORMAL LOW (ref 3.6–5.1)
Alkaline Phosphatase: 33 U/L (ref 33–130)
BUN: 16 mg/dL (ref 7–25)
CHLORIDE: 108 mmol/L (ref 98–110)
CO2: 21 mmol/L (ref 20–31)
CREATININE: 0.94 mg/dL — AB (ref 0.60–0.93)
Calcium: 8.7 mg/dL (ref 8.6–10.4)
GFR, EST AFRICAN AMERICAN: 67 mL/min (ref >=60)
GFR, Est Non African American: 58 mL/min — ABNORMAL LOW (ref >=60)
Glucose, Bld: 73 mg/dL (ref 65–99)
Potassium: 3.4 mmol/L — ABNORMAL LOW (ref 3.5–5.3)
Sodium: 142 mmol/L (ref 135–146)
Total Bilirubin: 0.2 mg/dL (ref 0.2–1.2)
Total Protein: 4.9 g/dL — ABNORMAL LOW (ref 6.1–8.1)

## 2016-09-09 LAB — TSH: TSH: 1.95 mIU/L

## 2016-09-09 LAB — LIPID PANEL
CHOL/HDL RATIO: 2 ratio (ref <=5.0)
CHOLESTEROL: 136 mg/dL (ref 125–200)
HDL: 69 mg/dL (ref >=46)
LDL Cholesterol: 48 mg/dL (ref <130)
TRIGLYCERIDES: 94 mg/dL (ref <150)
VLDL: 19 mg/dL (ref <30)

## 2016-09-09 MED ORDER — MEGESTROL ACETATE 400 MG/10ML PO SUSP: 400.0000 mg | Freq: Every day | ORAL | 0 refills | Status: DC

## 2016-09-09 MED FILL — MEGESTROL ACET 40 MG/ML SUS: 40 | 24 days supply | Qty: 240 | Fill #0

## 2016-09-10 ENCOUNTER — Encounter: Payer: Self-pay | Admitting: Family Medicine

## 2016-09-10 MED FILL — ?SPIRONOLACTONE 25 MG TABLE: 25 MG | 30 days supply | Qty: 30 | Fill #8

## 2016-09-10 MED FILL — ISOSORBIDE MN ER 60 MG TAB: 60 | 30 days supply | Qty: 30 | Fill #3

## 2016-09-18 NOTE — Telephone Encounter (Signed)
Called and left a message on Daughter(Rasheedah)'s voicemail. Advised of Linda's recommendations that patient does not need to be on Iron Supplement, only needs treatment for weight gain. All other labs only need to be monitored. Bonita Quin did say if caregivers wish we could send to hematologist for further review. Asked if any other questions to call back to our office. Thanks!

## 2016-09-18 NOTE — Telephone Encounter (Signed)
-----   Message from Henrietta Hoover, NP sent at 09/18/2016  8:50 AM EDT ----- Chol. OK. TSH OK.K+ minorly low. Anemia panel shows what is considered anemia of chronic disease. Liver functions are very minorly elevated (nothing needs to be done). Kidney function very minorly decrease (also nothing to do but monitor. She does not need iron supplement. Did she get the mammogram. Only treatment is the Megace to hopefully help her gain weight.

## 2016-09-19 ENCOUNTER — Telehealth: Payer: Self-pay | Admitting: *Deleted

## 2016-09-19 NOTE — Telephone Encounter (Signed)
Pt's daughter called to ask if any way new patient appt could be moved up sooner? She is concerned about pt waiting 3 weeks to see Hematologist for her anemia.  Notified New Pt Coordinator, Elease Etienne, and she states this is the soonest available.

## 2016-09-19 NOTE — Telephone Encounter (Signed)
Notified pt's daughter Prentiss Bells Dr. Bertis Ruddy agreed to see pt on Tuesday 11/7 at 11:45 am.   Check in at 11:30 am for 11:45 am appt.   She verbalized understanding.

## 2016-09-23 ENCOUNTER — Ambulatory Visit (HOSPITAL_BASED_OUTPATIENT_CLINIC_OR_DEPARTMENT_OTHER): Payer: Medicaid Other | Admitting: Hematology and Oncology

## 2016-09-23 ENCOUNTER — Ambulatory Visit (HOSPITAL_BASED_OUTPATIENT_CLINIC_OR_DEPARTMENT_OTHER): Payer: Medicaid Other

## 2016-09-23 ENCOUNTER — Encounter: Payer: Self-pay | Admitting: Hematology and Oncology

## 2016-09-23 ENCOUNTER — Telehealth: Payer: Self-pay | Admitting: Hematology and Oncology

## 2016-09-23 DIAGNOSIS — Z8673 Personal history of transient ischemic attack (TIA), and cerebral infarction without residual deficits: Secondary | ICD-10-CM

## 2016-09-23 DIAGNOSIS — N184 Chronic kidney disease, stage 4 (severe): Secondary | ICD-10-CM | POA: Diagnosis not present

## 2016-09-23 DIAGNOSIS — D631 Anemia in chronic kidney disease: Secondary | ICD-10-CM

## 2016-09-23 DIAGNOSIS — I5022 Chronic systolic (congestive) heart failure: Secondary | ICD-10-CM

## 2016-09-23 DIAGNOSIS — E440 Moderate protein-calorie malnutrition: Secondary | ICD-10-CM | POA: Diagnosis not present

## 2016-09-23 DIAGNOSIS — R5381 Other malaise: Secondary | ICD-10-CM

## 2016-09-23 LAB — CBC WITH DIFFERENTIAL/PLATELET
BASO%: 0.2 % (ref 0.0–2.0)
Basophils Absolute: 0 10*3/uL (ref 0.0–0.1)
EOS%: 0.8 % (ref 0.0–7.0)
Eosinophils Absolute: 0.1 10*3/uL (ref 0.0–0.5)
HCT: 29.9 % — ABNORMAL LOW (ref 34.8–46.6)
HGB: 10 g/dL — ABNORMAL LOW (ref 11.6–15.9)
LYMPH#: 1.3 10*3/uL (ref 0.9–3.3)
LYMPH%: 21 % (ref 14.0–49.7)
MCH: 26.2 pg (ref 25.1–34.0)
MCHC: 33.4 g/dL (ref 31.5–36.0)
MCV: 78.3 fL — ABNORMAL LOW (ref 79.5–101.0)
MONO#: 0.4 10*3/uL (ref 0.1–0.9)
MONO%: 6.8 % (ref 0.0–14.0)
NEUT%: 71.2 % (ref 38.4–76.8)
NEUTROS ABS: 4.4 10*3/uL (ref 1.5–6.5)
PLATELETS: 294 10*3/uL (ref 145–400)
RBC: 3.82 10*6/uL (ref 3.70–5.45)
RDW: 19 % — ABNORMAL HIGH (ref 11.2–14.5)
WBC: 6.2 10*3/uL (ref 3.9–10.3)

## 2016-09-23 NOTE — Assessment & Plan Note (Signed)
Even though her serum creatinine is low, the patient has minimal muscle mass and I think the serum creatinine underestimated her creatinine clearance She has borderline chronic kidney disease stage IV The cause of her anemia is due to anemia related to chronic renal failure I recommend drawing serum EPO level I will start her on darbepoetin injection every 2 weeks If her hemoglobin is less than 8 g, I will proceed with 1 unit of blood transfusion I will start her on low-dose treatment every 2 weeks and reassess after 4 doses of treatment We discussed some of the risks, benefits, and alternatives of erythropoietin stimulating agents such as Procrit or Aranesp. The patient is symptomatic from anemia and the EPO level is low. Some of the side-effects to be expected including risks of allergic reactions, skin rashes, headaches, risk of blood clots including heart attack and stroke. There is rare risks of causing growth of cancers.The patient is willing to proceed and went ahead to sign consent today.

## 2016-09-23 NOTE — Assessment & Plan Note (Signed)
The patient has significant progressive weight loss and she looks cachectic She was started on Megace I encouraged her to increase oral intake as tolerated and give her some strategy to improve her oral intake

## 2016-09-23 NOTE — Assessment & Plan Note (Signed)
she will continue current medical management. She has no signs of congestive heart failure today I recommend close follow-up with primary care doctor and cardiologist for medication adjustment.

## 2016-09-23 NOTE — Assessment & Plan Note (Signed)
She has chronic debility since her prior history of stroke I will consult physical therapy once she started gaining weight and her hemoglobin improves

## 2016-09-23 NOTE — Progress Notes (Signed)
[facility] CONSULT NOTE  Patient Care Team: Henrietta Hoover, NP as PCP - General (Family Medicine)  CHIEF COMPLAINTS/PURPOSE OF CONSULTATION:  Progressive anemia  HISTORY OF PRESENTING ILLNESS:  Julie Stein 81 y.o. female is here because of severe, progressive anemia  She was found to have abnormal CBC from routine blood draw. Her most recent hemoglobin was 8.3. I have the opportunity to review her blood test from 2016 and she have progressive anemia from normal baseline to as low as 8.3 She denies recent chest pain on exertion, shortness of breath on minimal exertion, pre-syncopal episodes, or palpitations. However, she has profound fatigue. The patient has minimum physical activity and usually rest and is bedbound at home She had not noticed any recent bleeding such as epistaxis, hematuria or hematochezia The patient denies over the counter NSAID ingestion. She is on antiplatelets agents. Her last colonoscopy was many years ago She had no prior history or diagnosis of cancer. Her age appropriate screening programs are up-to-date. She denies any pica and eats a variety of diet. However, she has very poor oral intake and currently weighs 77 pounds She never donated blood but she had received blood transfusion over 40 years ago after childbirth The patient follows closely with her cardiologist due to diagnosis of congestive heart failure and history of stroke She is on dietary restriction because of that She had recent falls but denies major injuries  MEDICAL HISTORY:  Past Medical History:  Diagnosis Date  . CAD (coronary artery disease)   . Cardiomyopathy (HCC)   . CHF (congestive heart failure) (HCC)   . Hypertension   . Stroke (HCC)   . SVT (supraventricular tachycardia) (HCC)     SURGICAL HISTORY: Past Surgical History:  Procedure Laterality Date  . CARDIAC CATHETERIZATION N/A 08/27/2015   Procedure: Right/Left Heart Cath and Coronary Angiography;  Surgeon:  Laurey Morale, MD;  Location: [facility] INVASIVE CV LAB;  Service: Cardiovascular;  Laterality: N/A;  . ELECTROPHYSIOLOGIC STUDY N/A 09/05/2015   Procedure: SVT Ablation;  Surgeon: Will Jorja Loa, MD;  Location: [facility] INVASIVE CV LAB;  Service: Cardiovascular;  Laterality: N/A;  . KNEE SURGERY      SOCIAL HISTORY: Social History   Social History  . Marital status: Married    Spouse name: N/A  . Number of children: N/A  . Years of education: N/A   Occupational History  . Not on file.   Social History Main Topics  . Smoking status: Never Smoker  . Smokeless tobacco: Never Used  . Alcohol use No  . Drug use: No  . Sexual activity: No   Other Topics Concern  . Not on file   Social History Narrative  . No narrative on file    FAMILY HISTORY: Family History  Problem Relation Age of Onset  . Hypertension Daughter     ALLERGIES:  is allergic to demerol [meperidine].  MEDICATIONS:  Current Outpatient Prescriptions  Medication Sig Dispense Refill  . aspirin EC 81 MG EC tablet Take 1 tablet (81 mg total) by mouth daily. 30 tablet 0  . carvedilol (COREG) 6.25 MG tablet Take 3 tablets (18.75 mg total) by mouth 2 (two) times daily with a meal. 270 tablet 3  . co-enzyme Q-10 30 MG capsule Take 30 mg by mouth daily.    . hydrALAZINE (APRESOLINE) 25 MG tablet Take 1 tablet (25 mg total) by mouth 3 (three) times daily. 90 tablet 3  . isosorbide mononitrate (IMDUR) 60 MG 24 hr tablet  Take 1 tablet (60 mg total) by mouth daily. 30 tablet 3  . losartan (COZAAR) 50 MG tablet Take 0.5 tablets (25 mg total) by mouth daily. 90 tablet 3  . megestrol (MEGACE) 400 MG/10ML suspension Take 10 mLs (400 mg total) by mouth daily. 240 mL 0  . Multiple Vitamins-Minerals (MULTIVITAMIN) tablet Take 1 tablet by mouth daily.    . nitroGLYCERIN (NITROSTAT) 0.4 MG SL tablet Place 1 tablet (0.4 mg total) under the tongue every 5 (five) minutes as needed for chest pain. 30 tablet 12  . Omega-3 Fatty Acids  (FISH OIL) 1000 MG CAPS Take 1,000 mg by mouth daily.    . potassium chloride (K-DUR) 10 MEQ tablet Take 2 tablets (20 mEq total) by mouth daily. 60 tablet 3  . rosuvastatin (CRESTOR) 20 MG tablet Take 1 tablet (20 mg total) by mouth daily. 30 tablet 6  . spironolactone (ALDACTONE) 25 MG tablet Take 1 tablet (25 mg total) by mouth daily. 90 tablet 3   No current facility-administered medications for this visit.     REVIEW OF SYSTEMS:   Constitutional: Denies fevers, chills or abnormal night sweats Eyes: Denies blurriness of vision, double vision or watery eyes Ears, nose, mouth, throat, and face: Denies mucositis or sore throat Respiratory: Denies cough, dyspnea or wheezes Cardiovascular: Denies palpitation, chest discomfort or lower extremity swelling Gastrointestinal:  Denies nausea, heartburn or change in bowel habits Skin: Denies abnormal skin rashes Lymphatics: Denies new lymphadenopathy or easy bruising Neurological:Denies numbness, tingling or new weaknesses Behavioral/Psych: Mood is stable, no new changes  All other systems were reviewed with the patient and are negative.  PHYSICAL EXAMINATION: ECOG PERFORMANCE STATUS: 3 - Symptomatic, >50% confined to bed  Vitals:   09/23/16 1206  BP: 115/75  Pulse: 79  Resp: 16  Temp: 98.4 F (36.9 C)   Filed Weights   09/23/16 1206  Weight: 77 lb (34.9 kg)    GENERAL:alert, no distress and comfortable. She looks thin and cachectic SKIN: skin color, texture, turgor are normal, no rashes or significant lesions EYES: normal, conjunctiva are pink and non-injected, sclera clear OROPHARYNX:no exudate, no erythema and lips, buccal mucosa, and tongue normal  NECK: supple, thyroid normal size, non-tender, without nodularity LYMPH:  no palpable lymphadenopathy in the cervical, axillary or inguinal LUNGS: clear to auscultation and percussion with normal breathing effort HEART: regular rate & rhythm and no murmurs and no lower extremity  edema ABDOMEN:abdomen soft, non-tender and normal bowel sounds Musculoskeletal:no cyanosis of digits and no clubbing  PSYCH: alert & oriented x 3 with fluent speech NEURO: no focal motor/sensory deficits  ASSESSMENT & PLAN:  Anemia due to stage 4 chronic kidney disease treated with darbepoetin (HCC) Even though her serum creatinine is low, the patient has minimal muscle mass and I think the serum creatinine underestimated her creatinine clearance She has borderline chronic kidney disease stage IV The cause of her anemia is due to anemia related to chronic renal failure I recommend drawing serum EPO level I will start her on darbepoetin injection every 2 weeks If her hemoglobin is less than 8 g, I will proceed with 1 unit of blood transfusion I will start her on low-dose treatment every 2 weeks and reassess after 4 doses of treatment We discussed some of the risks, benefits, and alternatives of erythropoietin stimulating agents such as Procrit or Aranesp. The patient is symptomatic from anemia and the EPO level is low. Some of the side-effects to be expected including risks of allergic reactions, skin  rashes, headaches, risk of blood clots including heart attack and stroke. There is rare risks of causing growth of cancers.The patient is willing to proceed and went ahead to sign consent today.    Chronic systolic heart failure (HCC) she will continue current medical management. She has no signs of congestive heart failure today I recommend close follow-up with primary care doctor and cardiologist for medication adjustment.   Physical deconditioning She has chronic debility since her prior history of stroke I will consult physical therapy once she started gaining weight and her hemoglobin improves  Protein-calorie malnutrition, moderate (HCC) The patient has significant progressive weight loss and she looks cachectic She was started on Megace I encouraged her to increase oral intake as  tolerated and give her some strategy to improve her oral intake  Orders Placed This Encounter  Procedures  . CBC with Differential/Platelet    Standing Status:   Standing    Number of Occurrences:   33    Standing Expiration Date:   09/23/2017  . Erythropoietin    Standing Status:   Future    Standing Expiration Date:   10/28/2017  . Hold Tube, Blood Bank    Standing Status:   Standing    Number of Occurrences:   3    Standing Expiration Date:   09/23/2017     All questions were answered. The patient knows to call the clinic with any problems, questions or concerns. I spent 40 minutes counseling the patient face to face. The total time spent in the appointment was 55 minutes and more than 50% was on counseling.     Artis Delay, MD 09/23/16 1:47 PM

## 2016-09-23 NOTE — Telephone Encounter (Signed)
Labs added for today per 09/23/16 los. Labs, Injections and follow up appointments scheduled per 09/23/16 los.  AVS report and appointment schedule given to patient, per 09/23/16 los.

## 2016-09-24 LAB — ERYTHROPOIETIN: Erythropoietin: 20.3 m[IU]/mL — ABNORMAL HIGH (ref 2.6–18.5)

## 2016-09-30 ENCOUNTER — Ambulatory Visit (HOSPITAL_BASED_OUTPATIENT_CLINIC_OR_DEPARTMENT_OTHER): Payer: Medicaid Other

## 2016-09-30 ENCOUNTER — Other Ambulatory Visit (HOSPITAL_BASED_OUTPATIENT_CLINIC_OR_DEPARTMENT_OTHER): Payer: Medicaid Other

## 2016-09-30 VITALS — BP 127/76 | HR 78 | Temp 98.4°F

## 2016-09-30 DIAGNOSIS — N184 Chronic kidney disease, stage 4 (severe): Secondary | ICD-10-CM

## 2016-09-30 DIAGNOSIS — D631 Anemia in chronic kidney disease: Secondary | ICD-10-CM | POA: Diagnosis not present

## 2016-09-30 LAB — CBC WITH DIFFERENTIAL/PLATELET
BASO%: 0.3 % (ref 0.0–2.0)
Basophils Absolute: 0 10*3/uL (ref 0.0–0.1)
EOS%: 0.7 % (ref 0.0–7.0)
Eosinophils Absolute: 0.1 10*3/uL (ref 0.0–0.5)
HEMATOCRIT: 27.6 % — AB (ref 34.8–46.6)
HGB: 9.2 g/dL — ABNORMAL LOW (ref 11.6–15.9)
LYMPH%: 12.4 % — AB (ref 14.0–49.7)
MCH: 26 pg (ref 25.1–34.0)
MCHC: 33.3 g/dL (ref 31.5–36.0)
MCV: 78 fL — ABNORMAL LOW (ref 79.5–101.0)
MONO#: 0.5 10*3/uL (ref 0.1–0.9)
MONO%: 7.1 % (ref 0.0–14.0)
NEUT#: 5.9 10*3/uL (ref 1.5–6.5)
NEUT%: 79.5 % — AB (ref 38.4–76.8)
Platelets: 325 10*3/uL (ref 145–400)
RBC: 3.54 10*6/uL — ABNORMAL LOW (ref 3.70–5.45)
RDW: 19.1 % — ABNORMAL HIGH (ref 11.2–14.5)
WBC: 7.4 10*3/uL (ref 3.9–10.3)
lymph#: 0.9 10*3/uL (ref 0.9–3.3)
nRBC: 0 % (ref 0–0)

## 2016-09-30 MED ORDER — DARBEPOETIN ALFA 150 MCG/0.3ML IJ SOSY
150.0000 ug | PREFILLED_SYRINGE | Freq: Once | INTRAMUSCULAR | Status: AC
  Administered 2016-09-30: 150 ug via SUBCUTANEOUS
  Filled 2016-09-30: qty 0.3

## 2016-09-30 NOTE — Patient Instructions (Signed)

## 2016-10-06 MED FILL — CARVEDILOL 6.25 MG TABLET: 6.25 | 45 days supply | Qty: 270 | Fill #1

## 2016-10-08 ENCOUNTER — Telehealth: Payer: Self-pay | Admitting: *Deleted

## 2016-10-08 MED FILL — ?SPIRONOLACTONE 25 MG TABLE: 25 MG | 30 days supply | Qty: 30 | Fill #9

## 2016-10-08 MED FILL — hydrALAZINE HCL 25 MG TABS: 25 | 30 days supply | Qty: 135 | Fill #1

## 2016-10-08 NOTE — Telephone Encounter (Signed)
Pt's daughters, Arriyana Danker and Skyleigh Jovel, request a letter from Dr. Bertis Ruddy to cancel travel plans due to their mother's illness.  They daughters are booked to go on a cruise on [employer] from 12/23 through 12/31, but do not feel their mother should be left alone at this time.  They are both caregivers for pt and ask for letter to state pt's illness and need to have caregiver present.  They have travel insurance and need MD letter to get reimbursed for their travel expenses.  Prentiss Bells Dave will bring pt to her appt next Tuesday 11/28 and pick up letter then. Informed her Dr. Bertis Ruddy out of office until Tues but should be able to have letter ready.  She verbalized understanding.

## 2016-10-13 ENCOUNTER — Encounter: Payer: Self-pay | Admitting: *Deleted

## 2016-10-14 ENCOUNTER — Other Ambulatory Visit (HOSPITAL_BASED_OUTPATIENT_CLINIC_OR_DEPARTMENT_OTHER): Payer: Medicaid Other

## 2016-10-14 ENCOUNTER — Ambulatory Visit (HOSPITAL_BASED_OUTPATIENT_CLINIC_OR_DEPARTMENT_OTHER): Payer: Medicaid Other

## 2016-10-14 VITALS — BP 108/71 | HR 80 | Temp 98.2°F | Resp 16

## 2016-10-14 DIAGNOSIS — N184 Chronic kidney disease, stage 4 (severe): Secondary | ICD-10-CM

## 2016-10-14 DIAGNOSIS — D631 Anemia in chronic kidney disease: Secondary | ICD-10-CM

## 2016-10-14 LAB — CBC WITH DIFFERENTIAL/PLATELET
BASO%: 0.5 % (ref 0.0–2.0)
Basophils Absolute: 0 10*3/uL (ref 0.0–0.1)
EOS%: 1.2 % (ref 0.0–7.0)
Eosinophils Absolute: 0.1 10*3/uL (ref 0.0–0.5)
HCT: 29.7 % — ABNORMAL LOW (ref 34.8–46.6)
HEMOGLOBIN: 9.7 g/dL — AB (ref 11.6–15.9)
LYMPH#: 0.9 10*3/uL (ref 0.9–3.3)
LYMPH%: 19.7 % (ref 14.0–49.7)
MCH: 26.3 pg (ref 25.1–34.0)
MCHC: 32.7 g/dL (ref 31.5–36.0)
MCV: 80.5 fL (ref 79.5–101.0)
MONO#: 0.5 10*3/uL (ref 0.1–0.9)
MONO%: 11.6 % (ref 0.0–14.0)
NEUT#: 2.9 10*3/uL (ref 1.5–6.5)
NEUT%: 67 % (ref 38.4–76.8)
NRBC: 0 % (ref 0–0)
Platelets: 267 10*3/uL (ref 145–400)
RBC: 3.69 10*6/uL — AB (ref 3.70–5.45)
RDW: 21.2 % — AB (ref 11.2–14.5)
WBC: 4.3 10*3/uL (ref 3.9–10.3)

## 2016-10-14 MED ORDER — DARBEPOETIN ALFA 150 MCG/0.3ML IJ SOSY
150.0000 ug | PREFILLED_SYRINGE | Freq: Once | INTRAMUSCULAR | Status: AC
  Administered 2016-10-14: 150 ug via SUBCUTANEOUS
  Filled 2016-10-14: qty 0.3

## 2016-10-14 NOTE — Patient Instructions (Signed)

## 2016-10-28 ENCOUNTER — Ambulatory Visit: Payer: Medicare Other

## 2016-10-28 ENCOUNTER — Other Ambulatory Visit (HOSPITAL_BASED_OUTPATIENT_CLINIC_OR_DEPARTMENT_OTHER): Payer: Medicaid Other

## 2016-10-28 DIAGNOSIS — D631 Anemia in chronic kidney disease: Secondary | ICD-10-CM

## 2016-10-28 DIAGNOSIS — N184 Chronic kidney disease, stage 4 (severe): Secondary | ICD-10-CM

## 2016-10-28 LAB — CBC WITH DIFFERENTIAL/PLATELET
BASO%: 0.3 % (ref 0.0–2.0)
Basophils Absolute: 0 10*3/uL (ref 0.0–0.1)
EOS%: 0.6 % (ref 0.0–7.0)
Eosinophils Absolute: 0 10*3/uL (ref 0.0–0.5)
HEMATOCRIT: 35.4 % (ref 34.8–46.6)
HEMOGLOBIN: 11.2 g/dL — AB (ref 11.6–15.9)
LYMPH#: 1.2 10*3/uL (ref 0.9–3.3)
LYMPH%: 16.5 % (ref 14.0–49.7)
MCH: 26 pg (ref 25.1–34.0)
MCHC: 31.6 g/dL (ref 31.5–36.0)
MCV: 82.3 fL (ref 79.5–101.0)
MONO#: 0.8 10*3/uL (ref 0.1–0.9)
MONO%: 11.5 % (ref 0.0–14.0)
NEUT%: 71.1 % (ref 38.4–76.8)
NEUTROS ABS: 5 10*3/uL (ref 1.5–6.5)
NRBC: 0 % (ref 0–0)
Platelets: 301 10*3/uL (ref 145–400)
RBC: 4.3 10*6/uL (ref 3.70–5.45)
RDW: 20 % — ABNORMAL HIGH (ref 11.2–14.5)
WBC: 7.1 10*3/uL (ref 3.9–10.3)

## 2016-10-28 NOTE — Progress Notes (Signed)
Hemoglobin noted on labs today is 11.2, No injection needed per protocol.  Current copy of labs and schedule given to patient and family members. Instructed to keep on current schedule and to call if issues occur. Family members verbalized understanding of instructions.

## 2016-11-03 NOTE — Progress Notes (Signed)
Julie Stein, is a 81 y.o. female  RUE:[id number]:[id number]  JYN:[id number]  DOB - 12/29/1934  CC:  Chief Complaint  Patient presents with  . Follow-up    loss of appetite  . Hypertension  . Diarrhea       HPI: Julie Henrilsie Pippins is a 81 y.o. female here for routine follow-up. She has CAD, cardiomyopathy, CHF and SVT followed by cardiology. She has CKD with anemia and recent weight loss.She also has a history of CVA. She reports no major change in her condition since last visit. Had recent visit with cardiology.   Allergies  Allergen Reactions  . Demerol [Meperidine]     intolerance   Past Medical History:  Diagnosis Date  . CAD (coronary artery disease)   . Cardiomyopathy (HCC)   . CHF (congestive heart failure) (HCC)   . Hypertension   . Stroke (HCC)   . SVT (supraventricular tachycardia) (HCC)    Current Outpatient Prescriptions on File Prior to Visit  Medication Sig Dispense Refill  . aspirin EC 81 MG EC tablet Take 1 tablet (81 mg total) by mouth daily. 30 tablet 0  . carvedilol (COREG) 6.25 MG tablet Take 3 tablets (18.75 mg total) by mouth 2 (two) times daily with a meal. 270 tablet 3  . co-enzyme Q-10 30 MG capsule Take 30 mg by mouth daily.    . hydrALAZINE (APRESOLINE) 25 MG tablet Take 1 tablet (25 mg total) by mouth 3 (three) times daily. 90 tablet 3  . isosorbide mononitrate (IMDUR) 60 MG 24 hr tablet Take 1 tablet (60 mg total) by mouth daily. 30 tablet 3  . losartan (COZAAR) 50 MG tablet Take 0.5 tablets (25 mg total) by mouth daily. 90 tablet 3  . Multiple Vitamins-Minerals (MULTIVITAMIN) tablet Take 1 tablet by mouth daily.    . nitroGLYCERIN (NITROSTAT) 0.4 MG SL tablet Place 1 tablet (0.4 mg total) under the tongue every 5 (five) minutes as needed for chest pain. 30 tablet 12  . Omega-3 Fatty Acids (FISH OIL) 1000 MG CAPS Take 1,000 mg by mouth daily.    . potassium chloride (K-DUR) 10 MEQ tablet Take 2 tablets (20 mEq total) by mouth daily. 60 tablet 3  . rosuvastatin  (CRESTOR) 20 MG tablet Take 1 tablet (20 mg total) by mouth daily. 30 tablet 6  . spironolactone (ALDACTONE) 25 MG tablet Take 1 tablet (25 mg total) by mouth daily. 90 tablet 3   No current facility-administered medications on file prior to visit.    Family History  Problem Relation Age of Onset  . Hypertension Daughter    Social History   Social History  . Marital status: Married    Spouse name: N/A  . Number of children: N/A  . Years of education: N/A   Occupational History  . Not on file.   Social History Main Topics  . Smoking status: Never Smoker  . Smokeless tobacco: Never Used  . Alcohol use No  . Drug use: No  . Sexual activity: No   Other Topics Concern  . Not on file   Social History Narrative  . No narrative on file    Review of Systems: Constitutional: + fatigue, weight loss Skin: Negative HENT: Negative  Eyes: Negative  Neck: Negative Respiratory: Negative Cardiovascular: Negative Gastrointestinal: Negative Genitourinary: Negative  Musculoskeletal: Negative   Neurological: Negative for Hematological:+ for anemia Psychiatric/Behavioral: Negative    Objective:   Vitals:   09/09/16 1351  BP: 113/66  Pulse: 74  Resp: 14  Temp: 98.1 F (36.7 C)    Physical Exam: Constitutional: Patient appears well-developed and well-nourished. No distress. HENT: Normocephalic, atraumatic, External right and left ear normal. Oropharynx is clear and moist.  Eyes: Conjunctivae and EOM are normal. PERRLA, no scleral icterus. Neck: Normal ROM. Neck supple. No lymphadenopathy, No thyromegaly. CVS: RRR, S1/S2 +, no murmurs, no gallops, no rubs Pulmonary: Effort and breath sounds normal, no stridor, rhonchi, wheezes, rales.  Abdominal: Soft. Normoactive BS,, no distension, tenderness, rebound or guarding.  Musculoskeletal: Normal range of motion. No edema and no tenderness.  Neuro: Alert.Normal muscle tone coordination. Non-focal Skin: Skin is warm and dry. No  rash noted. Not diaphoretic. No erythema. No pallor. Psychiatric: Normal mood and affect. Behavior, judgment, thought content normal.  Lab Results  Component Value Date   WBC 7.1 10/28/2016   HGB 11.2 (L) 10/28/2016   HCT 35.4 10/28/2016   MCV 82.3 10/28/2016   PLT 301 10/28/2016   Lab Results  Component Value Date   CREATININE 0.94 (H) 09/09/2016   BUN 16 09/09/2016   NA 142 09/09/2016   K 3.4 (L) 09/09/2016   CL 108 09/09/2016   CO2 21 09/09/2016    Lab Results  Component Value Date   HGBA1C 6.2 (H) 08/27/2015   Lipid Panel     Component Value Date/Time   CHOL 136 09/09/2016 1603   TRIG 94 09/09/2016 1603   HDL 69 09/09/2016 1603   CHOLHDL 2.0 09/09/2016 1603   VLDL 19 09/09/2016 1603   LDLCALC 48 09/09/2016 1603        Assessment and plan:   1. Loss of weight  - Anemia panel - CBC with Differential - COMPLETE METABOLIC PANEL WITH GFR - TSH - Ambulatory referral to Hematology  2. Anemia, unspecified type  - Anemia panel - Ambulatory referral to Hematology       5. Coronary artery disease of native heart with stable angina pectoris, unspecified vessel or lesion type (HCC)  - Lipid panel  6. Moderate malnutrition (HCC) -Bloodwork today   No Follow-up on file.  The patient was given clear instructions to go to ER or return to medical center if symptoms don't improve, worsen or new problems develop. The patient verbalized understanding.    Henrietta Hoover FNP  11/03/2016, 4:05 PM

## 2016-11-11 ENCOUNTER — Other Ambulatory Visit (HOSPITAL_BASED_OUTPATIENT_CLINIC_OR_DEPARTMENT_OTHER): Payer: Medicaid Other

## 2016-11-11 ENCOUNTER — Ambulatory Visit: Payer: Medicare Other

## 2016-11-11 DIAGNOSIS — D631 Anemia in chronic kidney disease: Secondary | ICD-10-CM | POA: Diagnosis not present

## 2016-11-11 DIAGNOSIS — N184 Chronic kidney disease, stage 4 (severe): Secondary | ICD-10-CM

## 2016-11-11 LAB — CBC WITH DIFFERENTIAL/PLATELET
BASO%: 0.3 % (ref 0.0–2.0)
Basophils Absolute: 0 10*3/uL (ref 0.0–0.1)
EOS%: 1.2 % (ref 0.0–7.0)
Eosinophils Absolute: 0.1 10*3/uL (ref 0.0–0.5)
HCT: 35.1 % (ref 34.8–46.6)
HGB: 11.3 g/dL — ABNORMAL LOW (ref 11.6–15.9)
LYMPH%: 24.2 % (ref 14.0–49.7)
MCH: 26.7 pg (ref 25.1–34.0)
MCHC: 32.2 g/dL (ref 31.5–36.0)
MCV: 83 fL (ref 79.5–101.0)
MONO#: 0.6 10*3/uL (ref 0.1–0.9)
MONO%: 7.6 % (ref 0.0–14.0)
NEUT%: 66.7 % (ref 38.4–76.8)
NEUTROS ABS: 5.1 10*3/uL (ref 1.5–6.5)
Platelets: 271 10*3/uL (ref 145–400)
RBC: 4.23 10*6/uL (ref 3.70–5.45)
RDW: 17.9 % — AB (ref 11.2–14.5)
WBC: 7.6 10*3/uL (ref 3.9–10.3)
lymph#: 1.8 10*3/uL (ref 0.9–3.3)

## 2016-11-11 NOTE — Progress Notes (Signed)
Hgb= 11.3; hold Aranesp for today.

## 2016-11-24 MED FILL — ?SPIRONOLACTONE 25 MG TABLE: 25 MG | 30 days supply | Qty: 30 | Fill #10

## 2016-11-25 ENCOUNTER — Encounter: Payer: Self-pay | Admitting: Hematology and Oncology

## 2016-11-25 ENCOUNTER — Ambulatory Visit (HOSPITAL_BASED_OUTPATIENT_CLINIC_OR_DEPARTMENT_OTHER): Payer: Medicaid Other | Admitting: Hematology and Oncology

## 2016-11-25 ENCOUNTER — Other Ambulatory Visit (HOSPITAL_BASED_OUTPATIENT_CLINIC_OR_DEPARTMENT_OTHER): Payer: Medicaid Other

## 2016-11-25 ENCOUNTER — Telehealth: Payer: Self-pay | Admitting: Hematology and Oncology

## 2016-11-25 ENCOUNTER — Other Ambulatory Visit: Payer: Self-pay | Admitting: *Deleted

## 2016-11-25 ENCOUNTER — Ambulatory Visit: Payer: Medicare Other

## 2016-11-25 ENCOUNTER — Ambulatory Visit (HOSPITAL_BASED_OUTPATIENT_CLINIC_OR_DEPARTMENT_OTHER): Payer: Medicaid Other

## 2016-11-25 VITALS — BP 119/85 | HR 86 | Temp 98.0°F | Resp 16 | Ht 59.0 in | Wt 75.5 lb

## 2016-11-25 DIAGNOSIS — D631 Anemia in chronic kidney disease: Secondary | ICD-10-CM | POA: Diagnosis not present

## 2016-11-25 DIAGNOSIS — R42 Dizziness and giddiness: Secondary | ICD-10-CM | POA: Insufficient documentation

## 2016-11-25 DIAGNOSIS — R410 Disorientation, unspecified: Secondary | ICD-10-CM

## 2016-11-25 DIAGNOSIS — N184 Chronic kidney disease, stage 4 (severe): Secondary | ICD-10-CM

## 2016-11-25 DIAGNOSIS — R531 Weakness: Secondary | ICD-10-CM

## 2016-11-25 DIAGNOSIS — E876 Hypokalemia: Secondary | ICD-10-CM

## 2016-11-25 DIAGNOSIS — R4182 Altered mental status, unspecified: Secondary | ICD-10-CM | POA: Diagnosis not present

## 2016-11-25 DIAGNOSIS — E440 Moderate protein-calorie malnutrition: Secondary | ICD-10-CM

## 2016-11-25 LAB — COMPREHENSIVE METABOLIC PANEL
ALBUMIN: 2.3 g/dL — AB (ref 3.5–5.0)
ALK PHOS: 60 U/L (ref 40–150)
ALT: 11 U/L (ref 0–55)
ANION GAP: 8 meq/L (ref 3–11)
AST: 16 U/L (ref 5–34)
BUN: 18 mg/dL (ref 7.0–26.0)
CO2: 33 meq/L — AB (ref 22–29)
Calcium: 8.8 mg/dL (ref 8.4–10.4)
Chloride: 104 mEq/L (ref 98–109)
Creatinine: 0.7 mg/dL (ref 0.6–1.1)
EGFR: 89 mL/min/{1.73_m2} — AB (ref >90)
GLUCOSE: 94 mg/dL (ref 70–140)
POTASSIUM: 2.9 meq/L — AB (ref 3.5–5.1)
SODIUM: 146 meq/L — AB (ref 136–145)
Total Bilirubin: 0.32 mg/dL (ref 0.20–1.20)
Total Protein: 5.1 g/dL — ABNORMAL LOW (ref 6.4–8.3)

## 2016-11-25 LAB — CBC WITH DIFFERENTIAL/PLATELET
BASO%: 0.6 % (ref 0.0–2.0)
BASOS ABS: 0 10*3/uL (ref 0.0–0.1)
EOS ABS: 0.1 10*3/uL (ref 0.0–0.5)
EOS%: 1.8 % (ref 0.0–7.0)
HCT: 32 % — ABNORMAL LOW (ref 34.8–46.6)
HGB: 10.1 g/dL — ABNORMAL LOW (ref 11.6–15.9)
LYMPH%: 22.2 % (ref 14.0–49.7)
MCH: 25.8 pg (ref 25.1–34.0)
MCHC: 31.6 g/dL (ref 31.5–36.0)
MCV: 81.8 fL (ref 79.5–101.0)
MONO#: 0.4 10*3/uL (ref 0.1–0.9)
MONO%: 8.6 % (ref 0.0–14.0)
NEUT#: 3.4 10*3/uL (ref 1.5–6.5)
NEUT%: 66.8 % (ref 38.4–76.8)
PLATELETS: 257 10*3/uL (ref 145–400)
RBC: 3.91 10*6/uL (ref 3.70–5.45)
RDW: 16.5 % — ABNORMAL HIGH (ref 11.2–14.5)
WBC: 5.1 10*3/uL (ref 3.9–10.3)
lymph#: 1.1 10*3/uL (ref 0.9–3.3)

## 2016-11-25 MED ORDER — SODIUM CHLORIDE 0.9 % IV SOLN
Freq: Once | INTRAVENOUS | Status: AC
  Administered 2016-11-25: 13:00:00 via INTRAVENOUS
  Filled 2016-11-25: qty 1000

## 2016-11-25 MED ORDER — DARBEPOETIN ALFA 150 MCG/0.3ML IJ SOSY: 150.0000 ug | PREFILLED_SYRINGE | Freq: Once | INTRAMUSCULAR | Status: DC

## 2016-11-25 NOTE — Progress Notes (Signed)
Dr. Bertis Ruddy aware of Hgb 10.1. Advises Aranesp not warranted today. Patient aware.

## 2016-11-25 NOTE — Assessment & Plan Note (Signed)
The patient has significant progressive weight loss and she looks cachectic She was started on Megace I encouraged her to increase oral intake as tolerated and give her some strategy to improve her oral intake Despite our best effort, the patient is noncompliant We discussed placement of feeding tube but the patient refused

## 2016-11-25 NOTE — Assessment & Plan Note (Signed)
She complained of dizziness and appeared clinically dehydrated I recommend holding off some of her blood pressure medication and diuretic therapy I will give her gentle IV fluid hydration today and tomorrow

## 2016-11-25 NOTE — Patient Instructions (Signed)
Dehydration, Adult Dehydration is a condition in which there is not enough fluid or water in the body. This happens when you lose more fluids than you take in. Important organs, such as the kidneys, brain, and heart, cannot function without a proper amount of fluids. Any loss of fluids from the body can lead to dehydration. Dehydration can range from mild to severe. This condition should be treated right away to prevent it from becoming severe. What are the causes? This condition may be caused by:  Vomiting.  Diarrhea.  Excessive sweating, such as from heat exposure or exercise.  Not drinking enough fluid, especially:  When ill.  While doing activity that requires a lot of energy.  Excessive urination.  Fever.  Infection.  Certain medicines, such as medicines that cause the body to lose excess fluid (diuretics).  Inability to access safe drinking water.  Reduced physical ability to get adequate water and food. What increases the risk? This condition is more likely to develop in people:  Who have a poorly controlled long-term (chronic) illness, such as diabetes, heart disease, or kidney disease.  Who are age 65 or older.  Who are disabled.  Who live in a place with high altitude.  Who play endurance sports. What are the signs or symptoms? Symptoms of mild dehydration may include:  Thirst.  Dry lips.  Slightly dry mouth.  Dry, warm skin.  Dizziness. Symptoms of moderate dehydration may include:  Very dry mouth.  Muscle cramps.  Dark urine. Urine may be the color of tea.  Decreased urine production.  Decreased tear production.  Heartbeat that is irregular or faster than normal (palpitations).  Headache.  Light-headedness, especially when you stand up from a sitting position.  Fainting (syncope). Symptoms of severe dehydration may include:  Changes in skin, such as:  Cold and clammy skin.  Blotchy (mottled) or pale skin.  Skin that does not  quickly return to normal after being lightly pinched and released (poor skin turgor).  Changes in body fluids, such as:  Extreme thirst.  No tear production.  Inability to sweat when body temperature is high, such as in hot weather.  Very little urine production.  Changes in vital signs, such as:  Weak pulse.  Pulse that is more than 100 beats a minute when sitting still.  Rapid breathing.  Low blood pressure.  Other changes, such as:  Sunken eyes.  Cold hands and feet.  Confusion.  Lack of energy (lethargy).  Difficulty waking up from sleep.  Short-term weight loss.  Unconsciousness. How is this diagnosed? This condition is diagnosed based on your symptoms and a physical exam. Blood and urine tests may be done to help confirm the diagnosis. How is this treated? Treatment for this condition depends on the severity. Mild or moderate dehydration can often be treated at home. Treatment should be started right away. Do not wait until dehydration becomes severe. Severe dehydration is an emergency and it needs to be treated in a hospital. Treatment for mild dehydration may include:  Drinking more fluids.  Replacing salts and minerals in your blood (electrolytes) that you may have lost. Treatment for moderate dehydration may include:  Drinking an oral rehydration solution (ORS). This is a drink that helps you replace fluids and electrolytes (rehydrate). It can be found at pharmacies and retail stores. Treatment for severe dehydration may include:  Receiving fluids through an IV tube.  Receiving an electrolyte solution through a feeding tube that is passed through your nose and into   your stomach (nasogastric tube, or NG tube).  Correcting any abnormalities in electrolytes.  Treating the underlying cause of dehydration. Follow these instructions at home:  If directed by your health care provider, drink an ORS:  Make an ORS by following instructions on the  package.  Start by drinking small amounts, about  cup (120 mL) every 5-10 minutes.  Slowly increase how much you drink until you have taken the amount recommended by your health care provider.  Drink enough clear fluid to keep your urine clear or pale yellow. If you were told to drink an ORS, finish the ORS first, then start slowly drinking other clear fluids. Drink fluids such as:  Water. Do not drink only water. Doing that can lead to having too little salt (sodium) in the body (hyponatremia).  Ice chips.  Fruit juice that you have added water to (diluted fruit juice).  Low-calorie sports drinks.  Avoid:  Alcohol.  Drinks that contain a lot of sugar. These include high-calorie sports drinks, fruit juice that is not diluted, and soda.  Caffeine.  Foods that are greasy or contain a lot of fat or sugar.  Take over-the-counter and prescription medicines only as told by your health care provider.  Do not take sodium tablets. This can lead to having too much sodium in the body (hypernatremia).  Eat foods that contain a healthy balance of electrolytes, such as bananas, oranges, potatoes, tomatoes, and spinach.  Keep all follow-up visits as told by your health care provider. This is important. Contact a health care provider if:  You have abdominal pain that:  Gets worse.  Stays in one area (localizes).  You have a rash.  You have a stiff neck.  You are more irritable than usual.  You are sleepier or more difficult to wake up than usual.  You feel weak or dizzy.  You feel very thirsty.  You have urinated only a small amount of very dark urine over 6-8 hours. Get help right away if:  You have symptoms of severe dehydration.  You cannot drink fluids without vomiting.  Your symptoms get worse with treatment.  You have a fever.  You have a severe headache.  You have vomiting or diarrhea that:  Gets worse.  Does not go away.  You have blood or green matter  (bile) in your vomit.  You have blood in your stool. This may cause stool to look black and tarry.  You have not urinated in 6-8 hours.  You faint.  Your heart rate while sitting still is over 100 beats a minute.  You have trouble breathing. This information is not intended to replace advice given to you by your health care provider. Make sure you discuss any questions you have with your health care provider. Document Released: 11/03/2005 Document Revised: 05/30/2016 Document Reviewed: 12/28/2015 Elsevier Interactive Patient Education  2017 [employer]    Hypokalemia Hypokalemia means that the amount of potassium in the blood is lower than normal.Potassium is a chemical that helps regulate the amount of fluid in the body (electrolyte). It also stimulates muscle tightening (contraction) and helps nerves work properly.Normally, most of the body's potassium is inside of cells, and only a very small amount is in the blood. Because the amount in the blood is so small, minor changes to potassium levels in the blood can be life-threatening. What are the causes? This condition may be caused by:  Antibiotic medicine.  Diarrhea or vomiting. Taking too much of a medicine that helps   you have a bowel movement (laxative) can cause diarrhea and lead to hypokalemia.  Chronic kidney disease (CKD).  Medicines that help the body get rid of excess fluid (diuretics).  Eating disorders, such as bulimia.  Low magnesium levels in the body.  Sweating a lot. What are the signs or symptoms? Symptoms of this condition include:  Weakness.  Constipation.  Fatigue.  Muscle cramps.  Mental confusion.  Skipped heartbeats or irregular heartbeat (palpitations).  Tingling or numbness. How is this diagnosed? This condition is diagnosed with a blood test. How is this treated? Hypokalemia can be treated by taking potassium supplements by mouth or adjusting the medicines that you take. Treatment  may also include eating more foods that contain a lot of potassium. If your potassium level is very low, you may need to get potassium through an IV tube in one of your veins and be monitored in the hospital. Follow these instructions at home:  Take over-the-counter and prescription medicines only as told by your health care provider. This includes vitamins and supplements.  Eat a healthy diet. A healthy diet includes fresh fruits and vegetables, whole grains, healthy fats, and lean proteins.  If instructed, eat more foods that contain a lot of potassium, such as:  Nuts, such as peanuts and pistachios.  Seeds, such as sunflower seeds and pumpkin seeds.  Peas, lentils, and lima beans.  Whole grain and bran cereals and breads.  Fresh fruits and vegetables, such as apricots, avocado, bananas, cantaloupe, kiwi, oranges, tomatoes, asparagus, and potatoes.  Orange juice.  Tomato juice.  Red meats.  Yogurt.  Keep all follow-up visits as told by your health care provider. This is important. Contact a health care provider if:  You have weakness that gets worse.  You feel your heart pounding or racing.  You vomit.  You have diarrhea.  You have diabetes (diabetes mellitus) and you have trouble keeping your blood sugar (glucose) in your target range. Get help right away if:  You have chest pain.  You have shortness of breath.  You have vomiting or diarrhea that lasts for more than 2 days.  You faint. This information is not intended to replace advice given to you by your health care provider. Make sure you discuss any questions you have with your health care provider. Document Released: 11/03/2005 Document Revised: 06/21/2016 Document Reviewed: 06/21/2016 Elsevier Interactive Patient Education  2017 [employer]  

## 2016-11-25 NOTE — Telephone Encounter (Signed)
Appointments scheduled per 1/9 LOS. Patient given AVS report and calendars with future scheduled appointments. °

## 2016-11-25 NOTE — Progress Notes (Signed)
Patient scheduled for Potassium infusion.  Aranesp injection to be given in infusion.

## 2016-11-25 NOTE — Assessment & Plan Note (Signed)
Even though her serum creatinine is low, the patient has minimal muscle mass and I think the serum creatinine underestimated her creatinine clearance She has borderline chronic kidney disease stage IV The cause of her anemia is due to anemia related to chronic renal failure She appears to have responded well to treatment. I will space out her appointment to every 4 weeks

## 2016-11-25 NOTE — Assessment & Plan Note (Signed)
Family members noted that she has intermittent confusion and poor memory I am wondering whether she may have dementia I will order urinalysis and urine culture to exclude urinary tract infection I recommend neurology consultation and she agreed

## 2016-11-25 NOTE — Patient Instructions (Signed)
Dehydration, Adult Dehydration is when there is not enough fluid or water in your body. This happens when you lose more fluids than you take in. Dehydration can range from mild to very bad. It should be treated right away to keep it from getting very bad. Symptoms of mild dehydration may include:   Thirst.  Dry lips.  Slightly dry mouth.  Dry, warm skin.  Dizziness. Symptoms of moderate dehydration may include:   Very dry mouth.  Muscle cramps.  Dark pee (urine). Pee may be the color of tea.  Your body making less pee.  Your eyes making fewer tears.  Heartbeat that is uneven or faster than normal (palpitations).  Headache.  Light-headedness, especially when you stand up from sitting.  Fainting (syncope). Symptoms of very bad dehydration may include:   Changes in skin, such as:  Cold and clammy skin.  Blotchy (mottled) or pale skin.  Skin that does not quickly return to normal after being lightly pinched and let go (poor skin turgor).  Changes in body fluids, such as:  Feeling very thirsty.  Your eyes making fewer tears.  Not sweating when body temperature is high, such as in hot weather.  Your body making very little pee.  Changes in vital signs, such as:  Weak pulse.  Pulse that is more than 100 beats a minute when you are sitting still.  Fast breathing.  Low blood pressure.  Other changes, such as:  Sunken eyes.  Cold hands and feet.  Confusion.  Lack of energy (lethargy).  Trouble waking up from sleep.  Short-term weight loss.  Unconsciousness. Follow these instructions at home:  If told by your doctor, drink an ORS:  Make an ORS by using instructions on the package.  Start by drinking small amounts, about  cup (120 mL) every 5-10 minutes.  Slowly drink more until you have had the amount that your doctor said to have.  Drink enough clear fluid to keep your pee clear or pale yellow. If you were told to drink an ORS, finish the  ORS first, then start slowly drinking clear fluids. Drink fluids such as:  Water. Do not drink only water by itself. Doing that can make the salt (sodium) level in your body get too low (hyponatremia).  Ice chips.  Fruit juice that you have added water to (diluted).  Low-calorie sports drinks.  Avoid:  Alcohol.  Drinks that have a lot of sugar. These include high-calorie sports drinks, fruit juice that does not have water added, and soda.  Caffeine.  Foods that are greasy or have a lot of fat or sugar.  Take over-the-counter and prescription medicines only as told by your doctor.  Do not take salt tablets. Doing that can make the salt level in your body get too high (hypernatremia).  Eat foods that have minerals (electrolytes). Examples include bananas, oranges, potatoes, tomatoes, and spinach.  Keep all follow-up visits as told by your doctor. This is important. Contact a doctor if:  You have belly (abdominal) pain that:  Gets worse.  Stays in one area (localizes).  You have a rash.  You have a stiff neck.  You get angry or annoyed more easily than normal (irritability).  You are more sleepy than normal.  You have a harder time waking up than normal.  You feel:  Weak.  Dizzy.  Very thirsty.  You have peed (urinated) only a small amount of very dark pee during 6-8 hours. Get help right away if:  You   have symptoms of very bad dehydration.  You cannot drink fluids without throwing up (vomiting).  Your symptoms get worse with treatment.  You have a fever.  You have a very bad headache.  You are throwing up or having watery poop (diarrhea) and it:  Gets worse.  Does not go away.  You have blood or something green (bile) in your throw-up.  You have blood in your poop (stool). This may cause poop to look black and tarry.  You have not peed in 6-8 hours.  You Hargens out (faint).  Your heart rate when you are sitting still is more than 100 beats a  minute.  You have trouble breathing. This information is not intended to replace advice given to you by your health care provider. Make sure you discuss any questions you have with your health care provider. Document Released: 08/30/2009 Document Revised: 05/23/2016 Document Reviewed: 12/28/2015 Elsevier Interactive Patient Education  2017 [employer]  

## 2016-11-25 NOTE — Progress Notes (Signed)
Julie Stein OFFICE PROGRESS NOTE  BERNHARDT, LINDA, NP SUMMARY OF HEMATOLOGIC HISTORY:  Julie Stein 82 y.o. female is here because of severe, progressive anemia  She was found to have abnormal CBC from routine blood draw. Her most recent hemoglobin was 8.3. I have the opportunity to review her blood test from 2016 and she have progressive anemia from normal baseline to as low as 8.3 She denies recent chest pain on exertion, shortness of breath on minimal exertion, pre-syncopal episodes, or palpitations. However, she has profound fatigue. The patient has minimum physical activity and usually rest and is bedbound at home She had not noticed any recent bleeding such as epistaxis, hematuria or hematochezia The patient denies over the counter NSAID ingestion. She is on antiplatelets agents. Her last colonoscopy was many years ago She had no prior history or diagnosis of cancer. Her age appropriate screening programs are up-to-date. She denies any pica and eats a variety of diet. However, she has very poor oral intake and currently weighs 77 pounds She never donated blood but she had received blood transfusion over 40 years ago after childbirth The patient follows closely with her cardiologist due to diagnosis of congestive heart failure and history of stroke She is on dietary restriction because of that She had recent falls but denies major injuries Starting 09/30/2016, she received intermittent doses of darbepoetin INTERVAL HISTORY: Julie Stein 82 y.o. female returns for follow-up. Multiple family members are present She continues to have poor oral intake and has lost more weight Family members are concerned she may have progressive renal failure and dehydration due to minimal oral intake and refusal to eat and drink Family also noted intermittent altered mental status/confusion She denies dysuria, frequency or urgency Denies recent fever or cough She denies chest pain or  shortness breath  I have reviewed the past medical history, past surgical history, social history and family history with the patient and they are unchanged from previous note.  ALLERGIES:  is allergic to demerol [meperidine].  MEDICATIONS:  Current Outpatient Prescriptions  Medication Sig Dispense Refill  . aspirin EC 81 MG EC tablet Take 1 tablet (81 mg total) by mouth daily. 30 tablet 0  . carvedilol (COREG) 6.25 MG tablet Take 3 tablets (18.75 mg total) by mouth 2 (two) times daily with a meal. 270 tablet 3  . co-enzyme Q-10 30 MG capsule Take 30 mg by mouth daily.    . hydrALAZINE (APRESOLINE) 25 MG tablet Take 1 tablet (25 mg total) by mouth 3 (three) times daily. 90 tablet 3  . isosorbide mononitrate (IMDUR) 60 MG 24 hr tablet Take 1 tablet (60 mg total) by mouth daily. 30 tablet 3  . losartan (COZAAR) 50 MG tablet Take 0.5 tablets (25 mg total) by mouth daily. 90 tablet 3  . megestrol (MEGACE) 400 MG/10ML suspension Take 10 mLs (400 mg total) by mouth daily. 240 mL 0  . Multiple Vitamins-Minerals (MULTIVITAMIN) tablet Take 1 tablet by mouth daily.    . nitroGLYCERIN (NITROSTAT) 0.4 MG SL tablet Place 1 tablet (0.4 mg total) under the tongue every 5 (five) minutes as needed for chest pain. 30 tablet 12  . Omega-3 Fatty Acids (FISH OIL) 1000 MG CAPS Take 1,000 mg by mouth daily.    . potassium chloride (K-DUR) 10 MEQ tablet Take 2 tablets (20 mEq total) by mouth daily. 60 tablet 3  . rosuvastatin (CRESTOR) 20 MG tablet Take 1 tablet (20 mg total) by mouth daily. 30 tablet 6  .  spironolactone (ALDACTONE) 25 MG tablet Take 1 tablet (25 mg total) by mouth daily. 90 tablet 3   No current facility-administered medications for this visit.      REVIEW OF SYSTEMS:   Constitutional: Denies fevers, chills or night sweats Eyes: Denies blurriness of vision Ears, nose, mouth, throat, and face: Denies mucositis or sore throat Respiratory: Denies cough, dyspnea or wheezes Cardiovascular: Denies  palpitation, chest discomfort or lower extremity swelling Gastrointestinal:  Denies nausea, heartburn or change in bowel habits Skin: Denies abnormal skin rashes Lymphatics: Denies new lymphadenopathy or easy bruising Neurological:Denies numbness, tingling or new weaknesses All other systems were reviewed with the patient and are negative.  PHYSICAL EXAMINATION: ECOG PERFORMANCE STATUS: 3 - Symptomatic, >50% confined to bed  Vitals:   11/25/16 1145  BP: 119/85  Pulse: 86  Resp: 16  Temp: 98 F (36.7 C)   Filed Weights   11/25/16 1145  Weight: 75 lb 8 oz (34.2 kg)    GENERAL:alert, no distress and comfortable. She appears thin and cachectic SKIN: skin color, texture, turgor are normal, no rashes or significant lesions EYES: normal, Conjunctiva are pink and non-injected, sclera clear OROPHARYNX:no exudate, no erythema and lips, buccal mucosa, and tongue normal  NECK: supple, thyroid normal size, non-tender, without nodularity LYMPH:  no palpable lymphadenopathy in the cervical, axillary or inguinal LUNGS: clear to auscultation and percussion with normal breathing effort HEART: regular rate & rhythm and no murmurs and no lower extremity edema ABDOMEN:abdomen soft, non-tender and normal bowel sounds Musculoskeletal:no cyanosis of digits and no clubbing  NEURO: alert & oriented x 3 with fluent speech, no focal motor/sensory deficits  LABORATORY DATA:  I have reviewed the data as listed     Component Value Date/Time   NA 146 (H) 11/25/2016 1157   K 2.9 (LL) 11/25/2016 1157   CL 108 09/09/2016 1457   CO2 33 (H) 11/25/2016 1157   GLUCOSE 94 11/25/2016 1157   BUN 18.0 11/25/2016 1157   CREATININE 0.7 11/25/2016 1157   CALCIUM 8.8 11/25/2016 1157   PROT 5.1 (L) 11/25/2016 1157   ALBUMIN 2.3 (L) 11/25/2016 1157   AST 16 11/25/2016 1157   ALT 11 11/25/2016 1157   ALKPHOS 60 11/25/2016 1157   BILITOT 0.32 11/25/2016 1157   GFRNONAA 58 (L) 09/09/2016 1457   GFRAA 67  09/09/2016 1457    No results found for: SPEP, UPEP  Lab Results  Component Value Date   WBC 5.1 11/25/2016   NEUTROABS 3.4 11/25/2016   HGB 10.1 (L) 11/25/2016   HCT 32.0 (L) 11/25/2016   MCV 81.8 11/25/2016   PLT 257 11/25/2016      Chemistry      Component Value Date/Time   NA 146 (H) 11/25/2016 1157   K 2.9 (LL) 11/25/2016 1157   CL 108 09/09/2016 1457   CO2 33 (H) 11/25/2016 1157   BUN 18.0 11/25/2016 1157   CREATININE 0.7 11/25/2016 1157      Component Value Date/Time   CALCIUM 8.8 11/25/2016 1157   ALKPHOS 60 11/25/2016 1157   AST 16 11/25/2016 1157   ALT 11 11/25/2016 1157   BILITOT 0.32 11/25/2016 1157      ASSESSMENT & PLAN:  Anemia due to stage 4 chronic kidney disease treated with darbepoetin (HCC) Even though her serum creatinine is low, the patient has minimal muscle mass and I think the serum creatinine underestimated her creatinine clearance She has borderline chronic kidney disease stage IV The cause of her anemia is due to  anemia related to chronic renal failure She appears to have responded well to treatment. I will space out her appointment to every 4 weeks  Protein-calorie malnutrition, moderate (HCC) The patient has significant progressive weight loss and she looks cachectic She was started on Megace I encouraged her to increase oral intake as tolerated and give her some strategy to improve her oral intake Despite our best effort, the patient is noncompliant We discussed placement of feeding tube but the patient refused   Hypokalemia due to inadequate potassium intake Likely due to poor oral intake We discussed potassium rich diet I am doubtful the patient can swallow oral potassium replacement therapy I recommend IV replacement today and tomorrow   Dizziness She complained of dizziness and appeared clinically dehydrated I recommend holding off some of her blood pressure medication and diuretic therapy I will give her gentle IV fluid  hydration today and tomorrow  Altered mental status Family members noted that she has intermittent confusion and poor memory I am wondering whether she may have dementia I will order urinalysis and urine culture to exclude urinary tract infection I recommend neurology consultation and she agreed   Orders Placed This Encounter  Procedures  . Urine culture    Standing Status:   Future    Number of Occurrences:   1    Standing Expiration Date:   12/30/2017  . Comprehensive metabolic panel    Standing Status:   Future    Number of Occurrences:   1    Standing Expiration Date:   11/25/2017  . Urinalysis, Microscopic - [facility]    Standing Status:   Future    Number of Occurrences:   1    Standing Expiration Date:   12/30/2017  . Ambulatory referral to Neurology    Referral Priority:   Routine    Referral Type:   Consultation    Referral Reason:   Specialty Services Required    Referred to Provider:   Vladimir Faster, DO    Requested Specialty:   Neurology    Number of Visits Requested:   1    All questions were answered. The patient knows to call the clinic with any problems, questions or concerns. No barriers to learning was detected.  I spent 30 minutes counseling the patient face to face. The total time spent in the appointment was 40 minutes and more than 50% was on counseling.     Artis Delay, MD 1/9/20186:15 PM

## 2016-11-25 NOTE — Assessment & Plan Note (Signed)
Likely due to poor oral intake We discussed potassium rich diet I am doubtful the patient can swallow oral potassium replacement therapy I recommend IV replacement today and tomorrow

## 2016-11-26 ENCOUNTER — Ambulatory Visit (HOSPITAL_BASED_OUTPATIENT_CLINIC_OR_DEPARTMENT_OTHER): Payer: Medicaid Other

## 2016-11-26 VITALS — BP 125/95

## 2016-11-26 DIAGNOSIS — D631 Anemia in chronic kidney disease: Secondary | ICD-10-CM

## 2016-11-26 DIAGNOSIS — N184 Chronic kidney disease, stage 4 (severe): Secondary | ICD-10-CM | POA: Diagnosis not present

## 2016-11-26 LAB — URINALYSIS, MICROSCOPIC - CHCC
Bilirubin (Urine): NEGATIVE
Blood: NEGATIVE
Glucose: NEGATIVE mg/dL
Ketones: NEGATIVE mg/dL
Nitrite: NEGATIVE
Protein: NEGATIVE mg/dL
RBC / HPF: NEGATIVE (ref 0–2)
Specific Gravity, Urine: 1.01 (ref 1.003–1.035)
Urobilinogen, UR: 0.2 mg/dL (ref 0.2–1)
pH: 8 (ref 4.6–8.0)

## 2016-11-26 MED ORDER — SODIUM CHLORIDE 0.9 % IV SOLN
INTRAVENOUS | Status: DC
  Administered 2016-11-26: 13:00:00 via INTRAVENOUS
  Filled 2016-11-26 (×2): qty 1000

## 2016-11-26 NOTE — Patient Instructions (Signed)
Hypokalemia Hypokalemia means that the amount of potassium in the blood is lower than normal.Potassium is a chemical that helps regulate the amount of fluid in the body (electrolyte). It also stimulates muscle tightening (contraction) and helps nerves work properly.Normally, most of the body's potassium is inside of cells, and only a very small amount is in the blood. Because the amount in the blood is so small, minor changes to potassium levels in the blood can be life-threatening. What are the causes? This condition may be caused by:  Antibiotic medicine.  Diarrhea or vomiting. Taking too much of a medicine that helps you have a bowel movement (laxative) can cause diarrhea and lead to hypokalemia.  Chronic kidney disease (CKD).  Medicines that help the body get rid of excess fluid (diuretics).  Eating disorders, such as bulimia.  Low magnesium levels in the body.  Sweating a lot. What are the signs or symptoms? Symptoms of this condition include:  Weakness.  Constipation.  Fatigue.  Muscle cramps.  Mental confusion.  Skipped heartbeats or irregular heartbeat (palpitations).  Tingling or numbness. How is this diagnosed? This condition is diagnosed with a blood test. How is this treated? Hypokalemia can be treated by taking potassium supplements by mouth or adjusting the medicines that you take. Treatment may also include eating more foods that contain a lot of potassium. If your potassium level is very low, you may need to get potassium through an IV tube in one of your veins and be monitored in the hospital. Follow these instructions at home:  Take over-the-counter and prescription medicines only as told by your health care provider. This includes vitamins and supplements.  Eat a healthy diet. A healthy diet includes fresh fruits and vegetables, whole grains, healthy fats, and lean proteins.  If instructed, eat more foods that contain a lot of potassium, such  as:  Nuts, such as peanuts and pistachios.  Seeds, such as sunflower seeds and pumpkin seeds.  Peas, lentils, and lima beans.  Whole grain and bran cereals and breads.  Fresh fruits and vegetables, such as apricots, avocado, bananas, cantaloupe, kiwi, oranges, tomatoes, asparagus, and potatoes.  Orange juice.  Tomato juice.  Red meats.  Yogurt.  Keep all follow-up visits as told by your health care provider. This is important. Contact a health care provider if:  You have weakness that gets worse.  You feel your heart pounding or racing.  You vomit.  You have diarrhea.  You have diabetes (diabetes mellitus) and you have trouble keeping your blood sugar (glucose) in your target range. Get help right away if:  You have chest pain.  You have shortness of breath.  You have vomiting or diarrhea that lasts for more than 2 days.  You faint. This information is not intended to replace advice given to you by your health care provider. Make sure you discuss any questions you have with your health care provider. Document Released: 11/03/2005 Document Revised: 06/21/2016 Document Reviewed: 06/21/2016 Elsevier Interactive Patient Education  2017 [employer]  

## 2016-11-27 ENCOUNTER — Other Ambulatory Visit: Payer: Self-pay | Admitting: *Deleted

## 2016-11-27 DIAGNOSIS — Z8673 Personal history of transient ischemic attack (TIA), and cerebral infarction without residual deficits: Secondary | ICD-10-CM

## 2016-11-27 DIAGNOSIS — R42 Dizziness and giddiness: Secondary | ICD-10-CM

## 2016-11-27 DIAGNOSIS — R4182 Altered mental status, unspecified: Secondary | ICD-10-CM

## 2016-11-27 LAB — URINE CULTURE

## 2016-12-01 ENCOUNTER — Telehealth: Payer: Self-pay | Admitting: *Deleted

## 2016-12-01 DIAGNOSIS — R410 Disorientation, unspecified: Secondary | ICD-10-CM

## 2016-12-01 DIAGNOSIS — N184 Chronic kidney disease, stage 4 (severe): Principal | ICD-10-CM

## 2016-12-01 DIAGNOSIS — R42 Dizziness and giddiness: Secondary | ICD-10-CM

## 2016-12-01 DIAGNOSIS — R4182 Altered mental status, unspecified: Secondary | ICD-10-CM

## 2016-12-01 DIAGNOSIS — E876 Hypokalemia: Secondary | ICD-10-CM

## 2016-12-01 DIAGNOSIS — E440 Moderate protein-calorie malnutrition: Secondary | ICD-10-CM

## 2016-12-01 DIAGNOSIS — R5381 Other malaise: Secondary | ICD-10-CM

## 2016-12-01 DIAGNOSIS — D631 Anemia in chronic kidney disease: Secondary | ICD-10-CM

## 2016-12-01 NOTE — Telephone Encounter (Signed)
Please proceed.

## 2016-12-01 NOTE — Telephone Encounter (Signed)
Daughter asks if Dr. Bertis Ruddy will order Home Health for Nursing and Physical Therapy.   States [facility] did assess pt for home care services after Hospital d/c but pt is now weaker and has declined in mental status since she was last assessed.   Daughter feels pt would benefit from Nursing visits to monitor her condition and medications.  PHT to help increase strength and for safety.  Aide to help with ADLs.  Informed her Dr. Bertis Ruddy can order Home Health to assess for eligibility for these services.   I asked if they have gotten appt for Neuro yet and she says they did get a call to make appt and she has to call them back.

## 2016-12-01 NOTE — Telephone Encounter (Signed)
Order/Referral placed for [facility].  Notified Clydie Braun, home care coordinator with [facility] of referral.

## 2016-12-12 ENCOUNTER — Telehealth: Payer: Self-pay | Admitting: *Deleted

## 2016-12-12 NOTE — Telephone Encounter (Signed)
Call from Speech Therapist at [facility] requesting verbal order from Dr. Bertis Ruddy for Speech therapy for swallowing.  Order given and they will fax over orders to be signed.

## 2016-12-15 ENCOUNTER — Telehealth: Payer: Self-pay | Admitting: *Deleted

## 2016-12-15 ENCOUNTER — Ambulatory Visit: Payer: Medicare Other | Admitting: Family Medicine

## 2016-12-15 NOTE — Telephone Encounter (Addendum)
"  Angel with [facility] calling for orders.  This patient needs order for speech therapy evaluation.  Family reports struggling with difficulty swallowing.  Also needs a Presenter, broadcasting order."  Transferred to Higher education careers adviser.

## 2016-12-15 NOTE — Telephone Encounter (Signed)
Daughter called requesting a referral to a kidney specialist and to have a 24 hour urine for PPU. They feel like her confusion and memory problems might be related to her kidney function.  They have not seen neurologist yet, but are in the process of getting it scheduled.Marland KitchenMarland Kitchen

## 2016-12-15 NOTE — Telephone Encounter (Signed)
Hi Tammi, please assist on getting these order to [facility]

## 2016-12-16 ENCOUNTER — Other Ambulatory Visit: Payer: Self-pay | Admitting: *Deleted

## 2016-12-16 DIAGNOSIS — D631 Anemia in chronic kidney disease: Secondary | ICD-10-CM

## 2016-12-16 DIAGNOSIS — R4182 Altered mental status, unspecified: Secondary | ICD-10-CM

## 2016-12-16 DIAGNOSIS — N184 Chronic kidney disease, stage 4 (severe): Secondary | ICD-10-CM

## 2016-12-16 NOTE — Telephone Encounter (Signed)
Referral placed to nephrologist.  Family aware

## 2016-12-17 ENCOUNTER — Telehealth: Payer: Self-pay

## 2016-12-17 NOTE — Telephone Encounter (Signed)
Daughter, Jacquelyne Cun called regarding her mother Julie Stein to state that her mother is not doing well and is going down hill really fast. She stated that she is more confused, not eating and not going to bathroom.  Wanted appointment for labs and to see Dr. Bertis Ruddy in the morning. Instructed daughter to take her mother to the emergency room immediately.

## 2016-12-23 ENCOUNTER — Other Ambulatory Visit: Payer: Self-pay | Admitting: Hematology and Oncology

## 2016-12-23 ENCOUNTER — Ambulatory Visit (HOSPITAL_BASED_OUTPATIENT_CLINIC_OR_DEPARTMENT_OTHER): Payer: Medicaid Other

## 2016-12-23 ENCOUNTER — Other Ambulatory Visit (HOSPITAL_BASED_OUTPATIENT_CLINIC_OR_DEPARTMENT_OTHER): Payer: Medicaid Other

## 2016-12-23 DIAGNOSIS — N184 Chronic kidney disease, stage 4 (severe): Secondary | ICD-10-CM

## 2016-12-23 DIAGNOSIS — D631 Anemia in chronic kidney disease: Secondary | ICD-10-CM

## 2016-12-23 LAB — CBC WITH DIFFERENTIAL/PLATELET
BASO%: 0.8 % (ref 0.0–2.0)
Basophils Absolute: 0 10*3/uL (ref 0.0–0.1)
EOS ABS: 0.1 10*3/uL (ref 0.0–0.5)
EOS%: 2.3 % (ref 0.0–7.0)
HEMATOCRIT: 29.4 % — AB (ref 34.8–46.6)
HEMOGLOBIN: 9.4 g/dL — AB (ref 11.6–15.9)
LYMPH%: 12.3 % — AB (ref 14.0–49.7)
MCH: 26.7 pg (ref 25.1–34.0)
MCHC: 31.9 g/dL (ref 31.5–36.0)
MCV: 83.7 fL (ref 79.5–101.0)
MONO#: 0.4 10*3/uL (ref 0.1–0.9)
MONO%: 7.4 % (ref 0.0–14.0)
NEUT%: 77.2 % — ABNORMAL HIGH (ref 38.4–76.8)
NEUTROS ABS: 4.1 10*3/uL (ref 1.5–6.5)
PLATELETS: 288 10*3/uL (ref 145–400)
RBC: 3.51 10*6/uL — AB (ref 3.70–5.45)
RDW: 16 % — ABNORMAL HIGH (ref 11.2–14.5)
WBC: 5.4 10*3/uL (ref 3.9–10.3)
lymph#: 0.7 10*3/uL — ABNORMAL LOW (ref 0.9–3.3)

## 2016-12-23 MED ORDER — DARBEPOETIN ALFA 150 MCG/0.3ML IJ SOSY
150.0000 ug | PREFILLED_SYRINGE | Freq: Once | INTRAMUSCULAR | Status: AC
  Administered 2016-12-23: 150 ug via SUBCUTANEOUS
  Filled 2016-12-23: qty 0.3

## 2016-12-23 NOTE — Patient Instructions (Signed)

## 2016-12-30 ENCOUNTER — Telehealth: Payer: Self-pay

## 2016-12-30 ENCOUNTER — Telehealth: Payer: Self-pay | Admitting: *Deleted

## 2016-12-30 NOTE — Telephone Encounter (Signed)
Patty with [facility] called and left message. She obtained a urine specimen today. Daughter is not giving Losartan and Imdur because she was told to not crush medication by MD. She wanted to be aware that daughter was not given medication. She requested a refill on Nitroglycerin.

## 2016-12-30 NOTE — Telephone Encounter (Signed)
[facility]ed [facility], let them know that we do not prescribe imdur or losartan or nitroglycerin, they need to contact patient's cardiologist.

## 2016-12-30 NOTE — Telephone Encounter (Signed)
She needs to call her cardiology service for those medication refills and discuss with them whether to crush medication or not or alternatives

## 2016-12-31 ENCOUNTER — Telehealth (HOSPITAL_COMMUNITY): Payer: Self-pay

## 2016-12-31 NOTE — Telephone Encounter (Signed)
Patty with [facility] called to report patient has not had losartan, imdur, or crestor for at least a month per daughter report as these pills are not crushable. After speaking with [facility] clinical pharmacist Elizabeth Palau, advised [facility] that we could transition these medication as follows... Losartan can be crushed. Imdur switched to Dinitrate 20 mg TID crushed. Crestor switched to Lipitor 40 mg dissolved in water. Attempted to call patient to get daughter's phone number to discuss these changes, no answer. Also needs apt with Dr. Shirlee Latch. Will forward to [facility] schedulers to see if we can get her scheduled to possibly evaluate patient first and discuss changes to medication.  Ave Filter, RN

## 2017-01-01 ENCOUNTER — Other Ambulatory Visit (HOSPITAL_COMMUNITY): Payer: Self-pay | Admitting: Cardiology

## 2017-01-02 ENCOUNTER — Ambulatory Visit (HOSPITAL_COMMUNITY)
Admission: RE | Admit: 2017-01-02 | Discharge: 2017-01-02 | Disposition: A | Discharge status: Home / Self Care | Payer: Medicaid Other | Source: Ambulatory Visit | Attending: Internal Medicine | Admitting: Internal Medicine

## 2017-01-02 VITALS — BP 116/76 | HR 89 | Wt 78.0 lb

## 2017-01-02 DIAGNOSIS — R5383 Other fatigue: Secondary | ICD-10-CM | POA: Diagnosis not present

## 2017-01-02 DIAGNOSIS — I255 Ischemic cardiomyopathy: Secondary | ICD-10-CM | POA: Diagnosis not present

## 2017-01-02 DIAGNOSIS — E785 Hyperlipidemia, unspecified: Secondary | ICD-10-CM | POA: Diagnosis not present

## 2017-01-02 DIAGNOSIS — I110 Hypertensive heart disease with heart failure: Secondary | ICD-10-CM | POA: Diagnosis not present

## 2017-01-02 DIAGNOSIS — R4182 Altered mental status, unspecified: Secondary | ICD-10-CM | POA: Diagnosis not present

## 2017-01-02 DIAGNOSIS — Z8249 Family history of ischemic heart disease and other diseases of the circulatory system: Secondary | ICD-10-CM | POA: Insufficient documentation

## 2017-01-02 DIAGNOSIS — D649 Anemia, unspecified: Secondary | ICD-10-CM | POA: Diagnosis not present

## 2017-01-02 DIAGNOSIS — R413 Other amnesia: Secondary | ICD-10-CM | POA: Diagnosis not present

## 2017-01-02 DIAGNOSIS — Z885 Allergy status to narcotic agent status: Secondary | ICD-10-CM | POA: Insufficient documentation

## 2017-01-02 DIAGNOSIS — I471 Supraventricular tachycardia: Secondary | ICD-10-CM | POA: Diagnosis not present

## 2017-01-02 DIAGNOSIS — Z79899 Other long term (current) drug therapy: Secondary | ICD-10-CM | POA: Insufficient documentation

## 2017-01-02 DIAGNOSIS — I428 Other cardiomyopathies: Secondary | ICD-10-CM | POA: Diagnosis not present

## 2017-01-02 DIAGNOSIS — I5022 Chronic systolic (congestive) heart failure: Secondary | ICD-10-CM | POA: Insufficient documentation

## 2017-01-02 DIAGNOSIS — R634 Abnormal weight loss: Secondary | ICD-10-CM | POA: Insufficient documentation

## 2017-01-02 DIAGNOSIS — Z8673 Personal history of transient ischemic attack (TIA), and cerebral infarction without residual deficits: Secondary | ICD-10-CM | POA: Diagnosis not present

## 2017-01-02 DIAGNOSIS — Z681 Body mass index (BMI) 19 or less, adult: Secondary | ICD-10-CM | POA: Diagnosis not present

## 2017-01-02 DIAGNOSIS — I10 Essential (primary) hypertension: Secondary | ICD-10-CM

## 2017-01-02 DIAGNOSIS — I2510 Atherosclerotic heart disease of native coronary artery without angina pectoris: Secondary | ICD-10-CM | POA: Diagnosis not present

## 2017-01-02 LAB — COMPREHENSIVE METABOLIC PANEL
ALBUMIN: 2.2 g/dL — AB (ref 3.5–5.0)
ALT: 10 U/L — AB (ref 14–54)
AST: 18 U/L (ref 15–41)
Alkaline Phosphatase: 53 U/L (ref 38–126)
Anion gap: 7 (ref 5–15)
BUN: 14 mg/dL (ref 6–20)
CHLORIDE: 109 mmol/L (ref 101–111)
CO2: 31 mmol/L (ref 22–32)
CREATININE: 0.79 mg/dL (ref 0.44–1.00)
Calcium: 8.8 mg/dL — ABNORMAL LOW (ref 8.9–10.3)
GFR calc Af Amer: >60 mL/min (ref >60)
GFR calc non Af Amer: >60 mL/min (ref >60)
Glucose, Bld: 86 mg/dL (ref 65–99)
Potassium: 2.8 mmol/L — ABNORMAL LOW (ref 3.5–5.1)
SODIUM: 147 mmol/L — AB (ref 135–145)
Total Bilirubin: 0.7 mg/dL (ref 0.3–1.2)
Total Protein: 5.7 g/dL — ABNORMAL LOW (ref 6.5–8.1)

## 2017-01-02 LAB — SEDIMENTATION RATE: Sed Rate: 12 mm/hr (ref 0–22)

## 2017-01-02 LAB — VITAMIN B12: Vitamin B-12: 820 pg/mL (ref 180–914)

## 2017-01-02 MED ORDER — SPIRONOLACTONE 25 MG PO TABS: 25.0000 mg | ORAL_TABLET | Freq: Every day | ORAL | 3 refills | Status: DC

## 2017-01-02 MED ORDER — CARVEDILOL 12.5 MG PO TABS: 12.5000 mg | ORAL_TABLET | Freq: Two times a day (BID) | ORAL | 6 refills | Status: DC

## 2017-01-02 MED ORDER — HYDRALAZINE HCL 25 MG PO TABS: 12.5000 mg | ORAL_TABLET | Freq: Three times a day (TID) | ORAL | 6 refills | Status: DC

## 2017-01-02 MED ORDER — ISOSORBIDE DINITRATE 20 MG PO TABS: 20.0000 mg | ORAL_TABLET | Freq: Three times a day (TID) | ORAL | 6 refills | Status: DC

## 2017-01-02 MED FILL — SPIRONOLACTONE 25 MG TABLET: 25 | 30 days supply | Qty: 30 | Fill #0

## 2017-01-02 MED FILL — ISOSORBIDE DN 20 MG TABLET: 20 | 20 days supply | Qty: 60 | Fill #0

## 2017-01-02 MED FILL — CARVEDILOL 12.5 MG TABLET: 12.5 | 30 days supply | Qty: 60 | Fill #0

## 2017-01-02 MED FILL — hydrALAZINE HCL 25 MG TABS: 25 | 30 days supply | Qty: 45 | Fill #0

## 2017-01-02 NOTE — Patient Instructions (Addendum)
STOP Crestor.  STOP Losartan.  STOP Imdur.  DECREASE Coreg to 12.5 mg twice daily.  DECREASE Hydralazine to 12.5 mg (1/2 tablet) three times daily.  START isosorbide dinitrate 20 mg once daily. CAN be crushed.  Spironolactone has been refilled to [facility].  Routine lab work today. Will notify you of abnormal results, otherwise no news is good news!  Follow up 1 month with Dr. Shirlee Latch.  Do the following things EVERYDAY: 1) Weigh yourself in the morning before breakfast. Write it down and keep it in a log. 2) Take your medicines as prescribed 3) Eat low salt foods-Limit salt (sodium) to 2000 mg per day.  4) Stay as active as you can everyday 5) Limit all fluids for the day to less than 2 liters

## 2017-01-03 NOTE — Progress Notes (Signed)
Patient ID: Tanea Moga, female   DOB: 25-Sep-1935, 82 y.o.   MRN: [id number]   PCP: Sharon Seller Primary Cardiologist: Dr Aundra Dubin  HPI: Haroldine Redler is a 82 y.o.  female with a history of SVT, HTN, CAD, mixed ischemic/nonischemic cardiomyopathy, CHF and stroke. She was admitted initially 08/23/15 with pulmonary edema. Echo on 10/9 revealed an EF of 20-25% with diffuse hypokinesis. It was thought she had a viral myocarditis or hypertensive cardiomyopathy. She developed transient weakness and was noted to have CVA by MRI.  She had R/LHC on 10/10 revealing normal to low filling pressures and normal cardiac output, moderate to severe ostial OM1 and severe distal LAD stenoses with plan for medical management. We thought her flash pulmonary edema and troponin elevation was likely from hypertensive crisis leading to demand ischemia. During this time, she was noted to have runs of symptomatic SVT.   Once stable, she was admitted to IP rehab, but then readmitted to hospital with syncope while on bedside commode requiring  CPR.  She had recurrent SVT noted when back on telemetry.  Syncope thought to be 2/2 rapid SVT versus vasovagal.  She had an EP study but SVT could not be triggered so no ablation was done.  Given further recurrences, she was started on amiodarone.  Amiodarone was subsequently stopped.   Echo 1/17 showed EF improved to 60-65%.    She had a significant rise in LFTs in 5/17, apparently had had a "bad cold" around this time.  LFTs were normal by 7/17.  She has continued to do poorly since last appointment.  She has lost considerable weight overall. She has been intermittently confused, this is worsening.  Progressive anemia, followed by hematology who has been concerned for anemia of renal disease.  Profound fatigue, not very active.  Lately, she has developed difficulty swallowing and pills are having to be crushed.  Uses walker when she gets out of bed.  Not noting dyspnea, just  fatigue/weakness. Very poor appetite, Megace did not help. No rash, arthralgias, myalgias.  BP is controlled.      Labs (10/16): K 3.7, creatinine 1.19, HCT 30.1Labs (11/16): K 3.8, creatinine 1.35, LDL 76, HDL 68, TSH normal, LFTs normal Labs (1/17): K 4.3, creatinine 1.35, TSH normal, AST 32, ALT 30, HCT 32.4.  Labs (04/07/16):  AST 123 ALT 197. Bili normal  Labs (6/17): AST 62, ALT 61, K 3.1, creatinine 1.28 Labs (7/17): LFTs normal, K 3.4, creatinine 1.48, TSH normal Labs (8/17): K 4.1, creatinine 1.5 Las (1/18): K 2.9, creatinine 0.7, hgb 9.4  PMH 1. Chronic systolic CHF: Mixed ischemic/nonischemic CMP.  Suspect long-standing HTN plays a major role.  Echo (10/16) with EF 20-25%, mild LVH, mild MR. Echo (1/17) with EF 60-65%, mild TR, severe LAE.  2. CAD:  LHC 10/16 with 20% proximal LAD, 40% mid LAD, 90% distal LAD, large high OM1 with 80% ostial stenosis => medically managed.  3. Hx of CVA: MRI head 08/28/15 showed small infarcts in watershed region.Pre-dated cardiac cath, likely related to HTN.  4. 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  Now on amiodarone given symptomatic frequent recurrences.   5. HTN:  Renal artery dopplers 08/30/15 did not show RAS.  6. Hyperlipidemia. 7. PFTs (11/16): suggestive of reactive airways disease like asthma.  8. Elevated LFTs: ?etiology.  9. Anemia 10. Confusion 11. Weight loss/anorexia  Current Outpatient Prescriptions  Medication Sig Dispense Refill  . aspirin EC  81 MG EC tablet Take 1 tablet (81 mg total) by mouth daily. 30 tablet 0  . carvedilol (COREG) 12.5 MG tablet Take 1 tablet (12.5 mg total) by mouth 2 (two) times daily with a meal. 60 tablet 6  . co-enzyme Q-10 30 MG capsule Take 30 mg by mouth daily.    . hydrALAZINE (APRESOLINE) 25 MG tablet Take 0.5 tablets (12.5 mg total) by mouth 3 (three) times daily. 45 tablet 6  . megestrol (MEGACE) 400 MG/10ML suspension Take 10 mLs  (400 mg total) by mouth daily. 240 mL 0  . Multiple Vitamins-Minerals (MULTIVITAMIN) tablet Take 1 tablet by mouth daily.    . nitroGLYCERIN (NITROSTAT) 0.4 MG SL tablet Place 1 tablet (0.4 mg total) under the tongue every 5 (five) minutes as needed for chest pain. 30 tablet 12  . Omega-3 Fatty Acids (FISH OIL) 1000 MG CAPS Take 1,000 mg by mouth daily.    . potassium chloride (K-DUR) 10 MEQ tablet Take 2 tablets (20 mEq total) by mouth daily. 60 tablet 3  . spironolactone (ALDACTONE) 25 MG tablet Take 1 tablet (25 mg total) by mouth daily. 90 tablet 3  . isosorbide dinitrate (ISORDIL) 20 MG tablet Take 1 tablet (20 mg total) by mouth 3 (three) times daily. 60 tablet 6   No current facility-administered medications for this encounter.     Allergies  Allergen Reactions  . Demerol [Meperidine]     intolerance      Social History   Social History  . Marital status: Married    Spouse name: N/A  . Number of children: N/A  . Years of education: N/A   Occupational History  . Not on file.   Social History Main Topics  . Smoking status: Never Smoker  . Smokeless tobacco: Never Used  . Alcohol use No  . Drug use: No  . Sexual activity: No   Other Topics Concern  . Not on file   Social History Narrative  . No narrative on file      Family History  Problem Relation Age of Onset  . Hypertension Daughter     Vitals:   01/02/17 1108  BP: 116/76  Pulse: 89  Weight: 78 lb (35.4 kg)    Wt Readings from Last 3 Encounters:  01/02/17 78 lb (35.4 kg)  11/25/16 75 lb 8 oz (34.2 kg)  09/23/16 77 lb (34.9 kg)     PHYSICAL EXAM: General:  Thin, NAD HEENT: normal Neck: supple. no JVD. Carotids 2+ bilat; no bruits. No lymphadenopathy or thryomegaly appreciated. Cor: PMI nondisplaced. Regular rate & rhythm. No S3/S4. No rubs or murmurs. Lungs: CTA, normal effort Abdomen: soft, nontender, nondistended. No hepatosplenomegaly. No bruits or masses. Good bowel sounds. Extremities:  no cyanosis, clubbing, rash, edema Neuro: alert & oriented x 3, cranial nerves grossly intact. moves all 4 extremities w/o difficulty. Psych: Withdrawn, flat affect  ASSESSMENT & PLAN: 1. Chronic Systolic HF: Mixed ischemic/nonischemic CMP, suspect long-standing HTN played a large role. ECHO 10/16 EF 20-25% with mild LVH. Repeat echo (1/17) showed EF improved back to normal range, 60-65%.  Euvolemic on exam.  NYHA class III primarily due to marked fatigue.  BP now lower, think we can cut back on her meds especially with EF normalized.  - Stop losartan (cannot crush).   - Decrease Coreg to 12.5 mg bid.  - Decrease hydralazine to 12.5 mg tid and stop Imdur, replace with isordil 20 tid (unable to crush Imdur). Doubt drug-induced lupus from hydralazine given  lack of rash, arthralgias and myalgias.  2. CAD: LHC 08/27/15 with 20% proximal LAD, 40% mid LAD, 90% distal LAD, large high OM1 with 80% ostial stenosis, medically managed.  She has had no chest pain. Good lipids 11/16.  - Continue ASA 81. - Stop statin for now, very rarely statin may cause cognitive defects, will see if stopping makes a difference.   3. CVA: MRI head 08/28/15 showed small infarcts in watershed region.Suspect related to HTN, neurologic symptoms preceded cath.  No residual deficit. 4. H/o SVT with syncope: No symptomatic recurrence. Unable to reproduce with EP study.  - She is off amiodarone.   5. HTN: Well-controlled now. Renal artery dopplers 08/30/15 did not show RAS. Family will monitor BP on lower doses of meds.  6. Elevated LFTs: Resolved.  7. Weight loss, fatigue, confusion, anemia: No localizing symptoms to suggest cancer.  Has been followed by hematology, have thought anemia of renal disease with elevated creatinine relative to her size.  She is going to be seeing a nephrologist.  As above, cutting back on her medications.  Will check B12, ammonia level, ESR today.  Given confusion/memory difficulty, she will be seeing  a neurologist.  We do not have a unifying diagnosis for her condition at this time.   Loralie Champagne MD 01/03/2017

## 2017-01-06 ENCOUNTER — Emergency Department (HOSPITAL_COMMUNITY): Payer: Medicare Other

## 2017-01-06 ENCOUNTER — Observation Stay (HOSPITAL_COMMUNITY): Payer: Medicare Other

## 2017-01-06 ENCOUNTER — Encounter (HOSPITAL_COMMUNITY): Payer: Self-pay

## 2017-01-06 ENCOUNTER — Inpatient Hospital Stay (HOSPITAL_COMMUNITY)
Admission: EM | Admit: 2017-01-06 | Discharge: 2017-01-09 | DRG: 641 | Disposition: A | Discharge status: Home / Self Care | Payer: Medicare Other | Attending: Internal Medicine | Admitting: Internal Medicine

## 2017-01-06 DIAGNOSIS — Z681 Body mass index (BMI) 19 or less, adult: Secondary | ICD-10-CM

## 2017-01-06 DIAGNOSIS — R4182 Altered mental status, unspecified: Secondary | ICD-10-CM | POA: Diagnosis present

## 2017-01-06 DIAGNOSIS — E876 Hypokalemia: Secondary | ICD-10-CM | POA: Diagnosis present

## 2017-01-06 DIAGNOSIS — I5022 Chronic systolic (congestive) heart failure: Secondary | ICD-10-CM | POA: Diagnosis not present

## 2017-01-06 DIAGNOSIS — R2990 Unspecified symptoms and signs involving the nervous system: Secondary | ICD-10-CM

## 2017-01-06 DIAGNOSIS — R5381 Other malaise: Secondary | ICD-10-CM | POA: Diagnosis present

## 2017-01-06 DIAGNOSIS — R55 Syncope and collapse: Secondary | ICD-10-CM | POA: Diagnosis not present

## 2017-01-06 DIAGNOSIS — I2510 Atherosclerotic heart disease of native coronary artery without angina pectoris: Secondary | ICD-10-CM | POA: Diagnosis not present

## 2017-01-06 DIAGNOSIS — I10 Essential (primary) hypertension: Secondary | ICD-10-CM | POA: Diagnosis present

## 2017-01-06 DIAGNOSIS — R410 Disorientation, unspecified: Secondary | ICD-10-CM | POA: Diagnosis present

## 2017-01-06 DIAGNOSIS — I471 Supraventricular tachycardia, unspecified: Secondary | ICD-10-CM | POA: Diagnosis present

## 2017-01-06 DIAGNOSIS — L89152 Pressure ulcer of sacral region, stage 2: Secondary | ICD-10-CM | POA: Diagnosis present

## 2017-01-06 DIAGNOSIS — I639 Cerebral infarction, unspecified: Secondary | ICD-10-CM | POA: Diagnosis not present

## 2017-01-06 DIAGNOSIS — I429 Cardiomyopathy, unspecified: Secondary | ICD-10-CM | POA: Diagnosis present

## 2017-01-06 DIAGNOSIS — Z885 Allergy status to narcotic agent status: Secondary | ICD-10-CM

## 2017-01-06 DIAGNOSIS — I130 Hypertensive heart and chronic kidney disease with heart failure and stage 1 through stage 4 chronic kidney disease, or unspecified chronic kidney disease: Secondary | ICD-10-CM | POA: Diagnosis present

## 2017-01-06 DIAGNOSIS — R627 Adult failure to thrive: Secondary | ICD-10-CM | POA: Diagnosis not present

## 2017-01-06 DIAGNOSIS — I959 Hypotension, unspecified: Secondary | ICD-10-CM

## 2017-01-06 DIAGNOSIS — F0390 Unspecified dementia without behavioral disturbance: Secondary | ICD-10-CM | POA: Diagnosis present

## 2017-01-06 DIAGNOSIS — I5021 Acute systolic (congestive) heart failure: Secondary | ICD-10-CM | POA: Diagnosis present

## 2017-01-06 DIAGNOSIS — R64 Cachexia: Secondary | ICD-10-CM | POA: Diagnosis present

## 2017-01-06 DIAGNOSIS — R29898 Other symptoms and signs involving the musculoskeletal system: Secondary | ICD-10-CM

## 2017-01-06 DIAGNOSIS — D638 Anemia in other chronic diseases classified elsewhere: Secondary | ICD-10-CM | POA: Diagnosis present

## 2017-01-06 DIAGNOSIS — Z8673 Personal history of transient ischemic attack (TIA), and cerebral infarction without residual deficits: Secondary | ICD-10-CM

## 2017-01-06 DIAGNOSIS — E860 Dehydration: Principal | ICD-10-CM | POA: Diagnosis present

## 2017-01-06 DIAGNOSIS — N182 Chronic kidney disease, stage 2 (mild): Secondary | ICD-10-CM | POA: Diagnosis present

## 2017-01-06 DIAGNOSIS — E870 Hyperosmolality and hypernatremia: Secondary | ICD-10-CM | POA: Diagnosis present

## 2017-01-06 DIAGNOSIS — L8990 Pressure ulcer of unspecified site, unspecified stage: Secondary | ICD-10-CM | POA: Insufficient documentation

## 2017-01-06 DIAGNOSIS — E440 Moderate protein-calorie malnutrition: Secondary | ICD-10-CM | POA: Diagnosis present

## 2017-01-06 DIAGNOSIS — D631 Anemia in chronic kidney disease: Secondary | ICD-10-CM | POA: Diagnosis present

## 2017-01-06 DIAGNOSIS — F329 Major depressive disorder, single episode, unspecified: Secondary | ICD-10-CM | POA: Diagnosis present

## 2017-01-06 DIAGNOSIS — Z8249 Family history of ischemic heart disease and other diseases of the circulatory system: Secondary | ICD-10-CM

## 2017-01-06 DIAGNOSIS — Z7982 Long term (current) use of aspirin: Secondary | ICD-10-CM

## 2017-01-06 DIAGNOSIS — D509 Iron deficiency anemia, unspecified: Secondary | ICD-10-CM | POA: Diagnosis present

## 2017-01-06 DIAGNOSIS — N184 Chronic kidney disease, stage 4 (severe): Secondary | ICD-10-CM

## 2017-01-06 HISTORY — DX: Other malaise: R53.81

## 2017-01-06 HISTORY — DX: Chronic kidney disease, stage 4 (severe): N18.4

## 2017-01-06 HISTORY — DX: Iron deficiency anemia, unspecified: D50.9

## 2017-01-06 HISTORY — DX: Moderate protein-calorie malnutrition: E44.0

## 2017-01-06 HISTORY — DX: Anemia, unspecified: D64.9

## 2017-01-06 LAB — CBC
HCT: 29 % — ABNORMAL LOW (ref 36.0–46.0)
Hemoglobin: 9.3 g/dL — ABNORMAL LOW (ref 12.0–15.0)
MCH: 25.9 pg — ABNORMAL LOW (ref 26.0–34.0)
MCHC: 32.1 g/dL (ref 30.0–36.0)
MCV: 80.8 fL (ref 78.0–100.0)
Platelets: 270 10*3/uL (ref 150–400)
RBC: 3.59 MIL/uL — ABNORMAL LOW (ref 3.87–5.11)
RDW: 18 % — ABNORMAL HIGH (ref 11.5–15.5)
WBC: 4.4 10*3/uL (ref 4.0–10.5)

## 2017-01-06 LAB — BASIC METABOLIC PANEL
Anion gap: 9 (ref 5–15)
BUN: 17 mg/dL (ref 6–20)
CO2: 28 mmol/L (ref 22–32)
Calcium: 8.4 mg/dL — ABNORMAL LOW (ref 8.9–10.3)
Chloride: 110 mmol/L (ref 101–111)
Creatinine, Ser: 0.76 mg/dL (ref 0.44–1.00)
GFR calc Af Amer: >60 mL/min (ref >60)
GFR calc non Af Amer: >60 mL/min (ref >60)
Glucose, Bld: 81 mg/dL (ref 65–99)
Potassium: 2.9 mmol/L — ABNORMAL LOW (ref 3.5–5.1)
Sodium: 147 mmol/L — ABNORMAL HIGH (ref 135–145)

## 2017-01-06 LAB — URINALYSIS, ROUTINE W REFLEX MICROSCOPIC
Bilirubin Urine: NEGATIVE
Glucose, UA: NEGATIVE mg/dL
Hgb urine dipstick: NEGATIVE
Ketones, ur: NEGATIVE mg/dL
Leukocytes, UA: NEGATIVE
Nitrite: NEGATIVE
Protein, ur: 30 mg/dL — AB
Specific Gravity, Urine: 1.021 (ref 1.005–1.030)
Squamous Epithelial / LPF: NONE SEEN
pH: 6 (ref 5.0–8.0)

## 2017-01-06 LAB — CBG MONITORING, ED: Glucose-Capillary: 70 mg/dL (ref 65–99)

## 2017-01-06 MED ORDER — ACETAMINOPHEN 650 MG RE SUPP: 650.0000 mg | Freq: Four times a day (QID) | RECTAL | Status: DC | PRN

## 2017-01-06 MED ORDER — ASPIRIN EC 81 MG PO TBEC
81.0000 mg | DELAYED_RELEASE_TABLET | Freq: Every day | ORAL | Status: DC
  Administered 2017-01-07 – 2017-01-09 (×2): 81 mg via ORAL
  Filled 2017-01-06 (×3): qty 1

## 2017-01-06 MED ORDER — SODIUM CHLORIDE 0.9 % IV BOLUS (SEPSIS)
1000.0000 mL | Freq: Once | INTRAVENOUS | Status: AC
  Administered 2017-01-06: 1000 mL via INTRAVENOUS

## 2017-01-06 MED ORDER — HEPARIN SODIUM (PORCINE) 5000 UNIT/ML IJ SOLN
5000.0000 [IU] | Freq: Three times a day (TID) | INTRAMUSCULAR | Status: DC
  Administered 2017-01-06 – 2017-01-08 (×7): 5000 [IU] via SUBCUTANEOUS
  Filled 2017-01-06 (×8): qty 1

## 2017-01-06 MED ORDER — SODIUM CHLORIDE 0.9 % IV SOLN
Freq: Once | INTRAVENOUS | Status: AC
  Administered 2017-01-06: 16:00:00 via INTRAVENOUS
  Filled 2017-01-06: qty 1000

## 2017-01-06 MED ORDER — HYDRALAZINE HCL 20 MG/ML IJ SOLN
5.0000 mg | INTRAMUSCULAR | Status: DC | PRN
Start: 2017-01-06 — End: 2017-01-09

## 2017-01-06 MED ORDER — HYDRALAZINE HCL 25 MG PO TABS: 12.5000 mg | ORAL_TABLET | Freq: Three times a day (TID) | ORAL | Status: DC

## 2017-01-06 MED ORDER — SODIUM CHLORIDE 0.9% FLUSH
3.0000 mL | Freq: Two times a day (BID) | INTRAVENOUS | Status: DC
  Administered 2017-01-07 – 2017-01-08 (×3): 3 mL via INTRAVENOUS

## 2017-01-06 MED ORDER — KCL IN DEXTROSE-NACL 40-5-0.45 MEQ/L-%-% IV SOLN
INTRAVENOUS | Status: DC
  Administered 2017-01-06 – 2017-01-07 (×2): via INTRAVENOUS
  Filled 2017-01-06 (×2): qty 1000

## 2017-01-06 MED ORDER — ONDANSETRON HCL 4 MG PO TABS: 4.0000 mg | ORAL_TABLET | Freq: Four times a day (QID) | ORAL | Status: DC | PRN

## 2017-01-06 MED ORDER — CARVEDILOL 12.5 MG PO TABS: 12.5000 mg | ORAL_TABLET | Freq: Two times a day (BID) | ORAL | Status: DC

## 2017-01-06 MED ORDER — SPIRONOLACTONE 25 MG PO TABS: 25.0000 mg | ORAL_TABLET | Freq: Every day | ORAL | Status: DC

## 2017-01-06 MED ORDER — POTASSIUM CHLORIDE CRYS ER 20 MEQ PO TBCR
40.0000 meq | EXTENDED_RELEASE_TABLET | Freq: Once | ORAL | Status: DC
  Filled 2017-01-06: qty 2

## 2017-01-06 MED ORDER — SENNOSIDES-DOCUSATE SODIUM 8.6-50 MG PO TABS: 1.0000 | ORAL_TABLET | Freq: Every evening | ORAL | Status: DC | PRN

## 2017-01-06 MED ORDER — MAGNESIUM SULFATE 2 GM/50ML IV SOLN
2.0000 g | Freq: Once | INTRAVENOUS | Status: AC
  Administered 2017-01-06: 2 g via INTRAVENOUS
  Filled 2017-01-06: qty 50

## 2017-01-06 MED ORDER — ISOSORBIDE DINITRATE 20 MG PO TABS: 20.0000 mg | ORAL_TABLET | Freq: Three times a day (TID) | ORAL | Status: DC

## 2017-01-06 MED ORDER — BISACODYL 5 MG PO TBEC: 5.0000 mg | DELAYED_RELEASE_TABLET | Freq: Every day | ORAL | Status: DC | PRN

## 2017-01-06 MED ORDER — MEGESTROL ACETATE 400 MG/10ML PO SUSP
400.0000 mg | Freq: Every day | ORAL | Status: DC
  Administered 2017-01-06 – 2017-01-09 (×4): 400 mg via ORAL
  Filled 2017-01-06 (×4): qty 10

## 2017-01-06 MED ORDER — ACETAMINOPHEN 325 MG PO TABS: 650.0000 mg | ORAL_TABLET | Freq: Four times a day (QID) | ORAL | Status: DC | PRN

## 2017-01-06 MED ORDER — ONDANSETRON HCL 4 MG/2ML IJ SOLN: 4.0000 mg | Freq: Four times a day (QID) | INTRAMUSCULAR | Status: DC | PRN

## 2017-01-06 NOTE — ED Notes (Signed)
PT did not have to use RR at this time  

## 2017-01-06 NOTE — ED Provider Notes (Signed)
[facility]-EMERGENCY DEPT Provider Note   CSN: [id number] Arrival date & time: 01/06/17  1222     History   Chief Complaint Chief Complaint  Patient presents with  . Loss of Consciousness    HPI Julie Stein is a 82 y.o. female.  HPI Patient presents to the emergency department with syncope that occurred just prior to arrival.  The patient was sitting in a chair and family reported she had a syncopal episode.  EMS reports that they got on scene and she is breathing 2 times a minute and they place a nasal airway and bag.  The patient, after several minutes the patient aroused the patient is unable to give any history on history is obtained from the family.  Patient denies any chest pain, shortness of breath, nausea, vomiting, abdominal pain, dizziness, blurred vision, headache, back pain, neck pain, fever, cough, dysuria, incontinence.  Past Medical History:  Diagnosis Date  . CAD (coronary artery disease)   . Cardiomyopathy (HCC)   . CHF (congestive heart failure) (HCC)   . Hypertension   . Stroke (HCC)   . SVT (supraventricular tachycardia) [facility])     Patient Active Problem List   Diagnosis Date Noted  . Confusion 11/25/2016  . Altered mental status 11/25/2016  . Hypokalemia due to inadequate potassium intake 11/25/2016  . Dizziness 11/25/2016  . Anemia due to stage 4 chronic kidney disease treated with darbepoetin (HCC) 09/23/2016  . Protein-calorie malnutrition, moderate (HCC) 09/23/2016  . Physical deconditioning 09/23/2016  . Elevated LFTs 04/29/2016  . Pain in the chest   . H/O: CVA (cerebrovascular accident)   . Syncope and collapse 08/31/2015  . Syncope 08/31/2015  . Chronic systolic heart failure (HCC) 08/31/2015  . Microcytic hypochromic anemia 08/31/2015  . Chest pain   . Hypertension   . Acute on chronic systolic CHF (congestive heart failure) (HCC)   . Acute bilat watershed infarction [facility]) 08/29/2015  . Weakness   . Encounter for central line placement   .  Acute respiratory failure with hypercapnia (HCC) 08/26/2015  . Essential hypertension 08/26/2015  . Acute systolic CHF (congestive heart failure), NYHA class 4 (HCC) 08/26/2015  . Pulmonary edema 08/26/2015  . Paroxysmal SVT (supraventricular tachycardia) (HCC) 08/26/2015  . Respiratory failure with hypoxia (HCC) 08/26/2015  . Acute heart failure (HCC)   . Acute respiratory failure with hypoxia (HCC)   . SVT (supraventricular tachycardia) (HCC)     Past Surgical History:  Procedure Laterality Date  . CARDIAC CATHETERIZATION N/A 08/27/2015   Procedure: Right/Left Heart Cath and Coronary Angiography;  Surgeon: Laurey Morale, MD;  Location[facility] INVASIVE CV LAB;  Service: Cardiovascular;  Laterality: N/A;  . ELECTROPHYSIOLOGIC STUDY N/A 09/05/2015   Procedure: SVT Ablation;  Surgeon: Will Jorja Loa, MD;  Location: [facility] INVASIVE CV LAB;  Service: Cardiovascular;  Laterality: N/A;  . KNEE SURGERY      OB History    No data available       Home Medications    Prior to Admission medications   Medication Sig Start Date End Date Taking? Authorizing Provider  aspirin EC 81 MG EC tablet Take 1 tablet (81 mg total) by mouth daily. 09/06/15   Maryann Mikhail, DO  carvedilol (COREG) 12.5 MG tablet Take 1 tablet (12.5 mg total) by mouth 2 (two) times daily with a meal. 01/02/17   Laurey Morale, MD  co-enzyme Q-10 30 MG capsule Take 30 mg by mouth daily.    Historical Provider, MD  hydrALAZINE (APRESOLINE) 25 MG  tablet Take 0.5 tablets (12.5 mg total) by mouth 3 (three) times daily. 01/02/17   Laurey Morale, MD  isosorbide dinitrate (ISORDIL) 20 MG tablet Take 1 tablet (20 mg total) by mouth 3 (three) times daily. 01/02/17   Laurey Morale, MD  megestrol (MEGACE) 400 MG/10ML suspension Take 10 mLs (400 mg total) by mouth daily. 09/09/16   Henrietta Hoover, NP  Multiple Vitamins-Minerals (MULTIVITAMIN) tablet Take 1 tablet by mouth daily.    Historical Provider, MD  nitroGLYCERIN  (NITROSTAT) 0.4 MG SL tablet Place 1 tablet (0.4 mg total) under the tongue every 5 (five) minutes as needed for chest pain. 09/10/15   Mcarthur Rossetti Angiulli, PA-C  Omega-3 Fatty Acids (FISH OIL) 1000 MG CAPS Take 1,000 mg by mouth daily.    Historical Provider, MD  potassium chloride (K-DUR) 10 MEQ tablet Take 2 tablets (20 mEq total) by mouth daily. 05/27/16   Laurey Morale, MD  spironolactone (ALDACTONE) 25 MG tablet Take 1 tablet (25 mg total) by mouth daily. 01/02/17   Laurey Morale, MD    Family History Family History  Problem Relation Age of Onset  . Hypertension Daughter     Social History Social History  Substance Use Topics  . Smoking status: Never Smoker  . Smokeless tobacco: Never Used  . Alcohol use No     Allergies   Demerol [meperidine]   Review of Systems Review of Systems  Level V caveat applies due to dementia Physical Exam Updated Vital Signs BP 104/78   Pulse 72   Resp 13   Wt 35.4 kg   SpO2 100%   BMI 15.75 kg/m   Physical Exam  Constitutional: She appears well-developed and well-nourished. No distress.  HENT:  Head: Normocephalic and atraumatic.  Mouth/Throat: Oropharynx is clear and moist.  Eyes: Pupils are equal, round, and reactive to light.  Neck: Normal range of motion. Neck supple.  Cardiovascular: Normal rate, regular rhythm and normal heart sounds.  Exam reveals no gallop and no friction rub.   No murmur heard. Pulmonary/Chest: Effort normal and breath sounds normal. No respiratory distress. She has no wheezes.  Abdominal: Soft. Bowel sounds are normal. She exhibits no distension. There is no tenderness.  Musculoskeletal: She exhibits no edema.  Neurological: She is alert. She exhibits normal muscle tone. Coordination normal.  Skin: Skin is warm and dry. No rash noted. No erythema.  Psychiatric: She has a normal mood and affect. Her behavior is normal.  Nursing note and vitals reviewed.    ED Treatments / Results  Labs (all labs  ordered are listed, but only abnormal results are displayed) Labs Reviewed  BASIC METABOLIC PANEL - Abnormal; Notable for the following:       Result Value   Sodium 147 (*)    Potassium 2.9 (*)    Calcium 8.4 (*)    All other components within normal limits  CBC - Abnormal; Notable for the following:    RBC 3.59 (*)    Hemoglobin 9.3 (*)    HCT 29.0 (*)    MCH 25.9 (*)    RDW 18.0 (*)    All other components within normal limits  URINALYSIS, ROUTINE W REFLEX MICROSCOPIC - Abnormal; Notable for the following:    APPearance HAZY (*)    Protein, ur 30 (*)    Bacteria, UA RARE (*)    All other components within normal limits  URINE CULTURE  CBG MONITORING, ED    EKG  EKG Interpretation  Date/Time:  Tuesday January 06 2017 12:40:31 EST Ventricular Rate:  77 PR Interval:    QRS Duration: 76 QT Interval:  395 QTC Calculation: 447 R Axis:   20 Text Interpretation:  Sinus rhythm Probable anterior infarct, age indeterminate Confirmed by Juleen China  MD, STEPHEN [phone]) on 01/06/2017 1:29:02 PM       Radiology Dg Chest 2 View  Result Date: 01/06/2017 CLINICAL DATA:  82 year old female without chest pain. Confusion. Initial encounter. EXAM: CHEST  2 VIEW COMPARISON:  08/31/2015. FINDINGS: No infiltrate, congestive heart failure or pneumothorax. Blunting posterior sulci suggestive of small pleural effusions/pleural thickening. No plain film evidence of pulmonary malignancy. Scoliosis with distortion of the mediastinal and cardiac silhouette. Heart size within normal limits. Calcified tortuous aorta. Bones are osteopenic. IMPRESSION: Blunting posterior sulci may represent presence of small pleural effusions or pleural thickening. No segmental infiltrate or congestive heart failure. Aortic atherosclerosis. Electronically Signed   By: Lacy Duverney M.D.   On: 01/06/2017 14:12   Ct Head Wo Contrast  Result Date: 01/06/2017 CLINICAL DATA:  82 year old female syncope with fall and confusion.  Initial encounter. EXAM: CT HEAD WITHOUT CONTRAST TECHNIQUE: Contiguous axial images were obtained from the base of the skull through the vertex without intravenous contrast. COMPARISON:  08/28/2015 MR and 08/27/2015 CT. FINDINGS: Brain: No intracranial hemorrhage. Chronic microvascular changes without CT evidence of large acute infarct. Mild global atrophy without hydrocephalus. No intracranial mass lesion noted on this unenhanced exam. Vascular: Vascular calcifications. Skull: No skull fracture. Sinuses/Orbits: No acute orbital abnormality. Partial opacification right mastoid air cells without obstructing lesion of eustachian tube noted. Minimal mucosal thickening right maxillary sinus. Other: Negative IMPRESSION: No skull fracture or intracranial hemorrhage. Chronic microvascular changes without CT evidence of large acute infarct. Partial opacification right mastoid air cells. Minimal mucosal thickening right maxillary sinus. Electronically Signed   By: Lacy Duverney M.D.   On: 01/06/2017 14:28    Procedures Procedures (including critical care time)  Medications Ordered in ED Medications  sodium chloride 0.9 % 1,000 mL with potassium chloride 80 mEq infusion (not administered)  sodium chloride 0.9 % bolus 1,000 mL (1,000 mLs Intravenous New Bag/Given 01/06/17 1531)     Initial Impression / Assessment and Plan / ED Course  I have reviewed the triage vital signs and the nursing notes.  Pertinent labs & imaging results that were available during my care of the patient were reviewed by me and considered in my medical decision making (see chart for details).    Patient will need admission to the hospital for syncope and we will replete her potassium also gave her some gentle hydration as the patient does appear clinically dehydrated and has had this problem in the past.  I think the syncope is multifactorial.  Final Clinical Impressions(s) / ED Diagnoses   Final diagnoses:  None    New  Prescriptions New Prescriptions   No medications on file     Charlestine Night, PA-C 01/06/17 1607    Raeford Razor, MD 01/19/17 1118

## 2017-01-06 NOTE — ED Notes (Signed)
Patient transported to X-ray 

## 2017-01-06 NOTE — Consult Note (Signed)
CONSULT NOTE  Date: 01/06/2017               Patient Name:  Pricella Gaugh MRN: [id number]  DOB: 17-Jul-1935 Age / Sex: 82 y.o., female        PCP: Concepcion Living Primary Cardiologist: Shirlee Latch            Referring Physician: Teresa Coombs              Reason for Consult: Syncope            History of Present Illness: Patient is a 82 y.o. female with a PMHx of Chronic systolic congestive heart failure. She is well established in the advanced heart failure clinic., who was admitted to [facility] on 01/06/2017 for evaluation of syncope.Marland Kitchen   She was seen in the heart failure clinic. She has had syncopal episodes in the past. No specific etiology was found for her previous episodes of syncope. She has a history of supraventricular tachycardia.   She has had an EP study in the past but the SVT could not be triggered so no ablation was done.  She has done poorly for the past several months. She's had considerable weight loss. She's had progressive anemia. She is having difficulty swallowing and has to crush her pills. She was started on ISDN several days ago. Seemed to tolerate it fairly well Is on Megace   Is seen today with her daugher, Rasheedah.  Today she was seated,  Had started shaking,  She was out.   Probably for 20 minutes. No seizure activity. No post ictal period, was a little groggy No bowel or bladder incontenence  Has lost 35 lbs over the past 5 months . Eating a bit better according to patient and daughter    Medications: Outpatient medications:  (Not in a hospital admission)  Current medications: No current facility-administered medications for this encounter.    Current Outpatient Prescriptions  Medication Sig Dispense Refill  . aspirin EC 81 MG EC tablet Take 1 tablet (81 mg total) by mouth daily. 30 tablet 0  . carvedilol (COREG) 12.5 MG tablet Take 1 tablet (12.5 mg total) by mouth 2 (two) times daily with a meal. 60 tablet 6  . hydrALAZINE (APRESOLINE) 25 MG  tablet Take 0.5 tablets (12.5 mg total) by mouth 3 (three) times daily. 45 tablet 6  . isosorbide dinitrate (ISORDIL) 20 MG tablet Take 1 tablet (20 mg total) by mouth 3 (three) times daily. 60 tablet 6  . megestrol (MEGACE) 400 MG/10ML suspension Take 10 mLs (400 mg total) by mouth daily. (Patient taking differently: Take 200-400 mg by mouth daily. ) 240 mL 0  . nitroGLYCERIN (NITROSTAT) 0.4 MG SL tablet Place 1 tablet (0.4 mg total) under the tongue every 5 (five) minutes as needed for chest pain. 30 tablet 12  . spironolactone (ALDACTONE) 25 MG tablet Take 1 tablet (25 mg total) by mouth daily. 90 tablet 3     Allergies  Allergen Reactions  . Demerol [Meperidine] Other (See Comments)    Hallucinations, out of body experience     Past Medical History:  Diagnosis Date  . CAD (coronary artery disease)   . Cardiomyopathy (HCC)   . CHF (congestive heart failure) (HCC)   . Hypertension   . Stroke (HCC)   . SVT (supraventricular tachycardia) (HCC)     Past Surgical History:  Procedure Laterality Date  . CARDIAC CATHETERIZATION N/A 08/27/2015   Procedure: Right/Left Heart Cath and Coronary Angiography;  Surgeon: Laurey Morale, MD;  Location: [facility] INVASIVE CV LAB;  Service: Cardiovascular;  Laterality: N/A;  . ELECTROPHYSIOLOGIC STUDY N/A 09/05/2015   Procedure: SVT Ablation;  Surgeon: Will Jorja Loa, MD;  Location: [facility] INVASIVE CV LAB;  Service: Cardiovascular;  Laterality: N/A;  . KNEE SURGERY      Family History  Problem Relation Age of Onset  . Hypertension Daughter     Social History:  reports that she has never smoked. She has never used smokeless tobacco. She reports that she does not drink alcohol or use drugs.   Review of Systems: Constitutional:  denies fever, chills, diaphoresis, appetite change and fatigue.  HEENT: denies photophobia, eye pain, redness, hearing loss, ear pain, congestion, sore throat, rhinorrhea, sneezing, neck pain, neck stiffness and tinnitus.    Respiratory: denies SOB, DOE, cough, chest tightness, and wheezing.  Cardiovascular: denies chest pain, palpitations and leg swelling.  Gastrointestinal: denies nausea, vomiting, abdominal pain, diarrhea, constipation, blood in stool.  Genitourinary: denies dysuria, urgency, frequency, hematuria, flank pain and difficulty urinating.  Musculoskeletal: denies  myalgias, back pain, joint swelling, arthralgias and gait problem.   Skin: denies pallor, rash and wound.  Neurological: admits to  syncope,    Hematological: denies adenopathy, easy bruising, personal or family bleeding history.  Psychiatric/ Behavioral: denies suicidal ideation, mood changes, confusion, nervousness, sleep disturbance and agitation.    Physical Exam: BP 126/83   Pulse 81   Resp 15   Wt 78 lb (35.4 kg)   SpO2 100%   BMI 15.75 kg/m   Wt Readings from Last 3 Encounters:  01/06/17 78 lb (35.4 kg)  01/02/17 78 lb (35.4 kg)  11/25/16 75 lb 8 oz (34.2 kg)    General: Vital signs reviewed and noted. Well-developed, well-nourished, in no acute distress; alert,   Head: Normocephalic, atraumatic, sclera anicteric,   Neck: Supple. Negative for carotid bruits. No JVD   Lungs:  Clear bilaterally, no  wheezes, rales, or rhonchi. Breathing is normal   Heart: RRR with S1 S2. No murmurs, rubs, or gallops   Abdomen/ GI :  Soft, non-tender, non-distended with normoactive bowel sounds. No hepatomegaly. No rebound/guarding. No obvious abdominal masses   MSK: Strength and the appear normal for age.   Extremities: No clubbing or cyanosis. No edema.  Distal pedal pulses are 2+ and equal   Neurologic:  CN are grossly intact,  No obvious motor or sensory defect.  Alert and oriented X 3. Moves all extremities spontaneously.  Psych: Responds to questions appropriately with a normal affect.     Lab results: Basic Metabolic Panel:  Recent Labs Lab 01/02/17 1203 01/06/17 1309  NA 147* 147*  K 2.8* 2.9*  CL 109 110  CO2 31 28   GLUCOSE 86 81  BUN 14 17  CREATININE 0.79 0.76  CALCIUM 8.8* 8.4*    Liver Function Tests:  Recent Labs Lab 01/02/17 1203  AST 18  ALT 10*  ALKPHOS 53  BILITOT 0.7  PROT 5.7*  ALBUMIN 2.2*   No results for input(s): LIPASE, AMYLASE in the last 168 hours. No results for input(s): AMMONIA in the last 168 hours.  CBC:  Recent Labs Lab 01/06/17 1309  WBC 4.4  HGB 9.3*  HCT 29.0*  MCV 80.8  PLT 270    Cardiac Enzymes: No results for input(s): CKTOTAL, CKMB, CKMBINDEX, TROPONINI in the last 168 hours.  BNP: Invalid input(s): POCBNP  CBG:  Recent Labs Lab 01/06/17 1259  GLUCAP 70    Coagulation Studies:  No results for input(s): LABPROT, INR in the last 72 hours.   Other results:  Personal review of EKG shows :   NSR ,  Poor R wave progression.   NS ST abn.      Imaging: Dg Chest 2 View  Result Date: 01/06/2017 CLINICAL DATA:  82 year old female without chest pain. Confusion. Initial encounter. EXAM: CHEST  2 VIEW COMPARISON:  08/31/2015. FINDINGS: No infiltrate, congestive heart failure or pneumothorax. Blunting posterior sulci suggestive of small pleural effusions/pleural thickening. No plain film evidence of pulmonary malignancy. Scoliosis with distortion of the mediastinal and cardiac silhouette. Heart size within normal limits. Calcified tortuous aorta. Bones are osteopenic. IMPRESSION: Blunting posterior sulci may represent presence of small pleural effusions or pleural thickening. No segmental infiltrate or congestive heart failure. Aortic atherosclerosis. Electronically Signed   By: Lacy Duverney M.D.   On: 01/06/2017 14:12   Ct Head Wo Contrast  Result Date: 01/06/2017 CLINICAL DATA:  82 year old female syncope with fall and confusion. Initial encounter. EXAM: CT HEAD WITHOUT CONTRAST TECHNIQUE: Contiguous axial images were obtained from the base of the skull through the vertex without intravenous contrast. COMPARISON:  08/28/2015 MR and  08/27/2015 CT. FINDINGS: Brain: No intracranial hemorrhage. Chronic microvascular changes without CT evidence of large acute infarct. Mild global atrophy without hydrocephalus. No intracranial mass lesion noted on this unenhanced exam. Vascular: Vascular calcifications. Skull: No skull fracture. Sinuses/Orbits: No acute orbital abnormality. Partial opacification right mastoid air cells without obstructing lesion of eustachian tube noted. Minimal mucosal thickening right maxillary sinus. Other: Negative IMPRESSION: No skull fracture or intracranial hemorrhage. Chronic microvascular changes without CT evidence of large acute infarct. Partial opacification right mastoid air cells. Minimal mucosal thickening right maxillary sinus. Electronically Signed   By: Lacy Duverney M.D.   On: 01/06/2017 14:28        Assessment & Plan:  1. Syncope: Patient presents today with an episode of syncope. One of her daughter states that she was out for about 20 minutes although this cannot be completely verified. There was no bowel or bladder incontinence. There was no post ictal episode following. The patient has a history of syncope in the past and also has a history of SVT. She has had an EP study and the SVT could not be triggered.  At this point I would agree with admission to the hospitalist service. We will monitor her on telemetry. Heart rate and blood pressure are currently normal and I would not make any medication changes.  She saw Dr. Shirlee Latch 4 days ago and not office visit he decreased several medications so the likelihood that this is a medication related episode of syncope is very low.  She's lost about 35 pounds over the past 5 months and it could be that this represents just a general decline of her overall health. I discussed this fact with her daughter and she understands this issue very well.  2. Chronic systolic congestive heart failure: She has been seen by the advanced heart failure clinic. No  change in medications for now. Echocardiogram has been reordered.  3. Coronary artery disease: She has moderate coronary artery disease. She has a tight stenosis in the distal LAD and a tight stenosis in the first obtuse marginal artery. She has been managed medically. She does not have any episodes of ischemia.  4. Failure to thrive: She's had a 35 pound weight loss over the past 5 months.  She is on Megace but this has not really helped her appetite too  much.  Dr. Shirlee Latch will see her tomorrow in the hospital.    Alvia Grove., MD, [facility] 01/06/2017, 5:27 PM Office - [phone] Pager [phone]

## 2017-01-06 NOTE — ED Triage Notes (Signed)
Pt from home by [facility] for a spell of passing out. Pt was sitting in a chair and drinking juice and her right arm started shaking and pt then passed out and was only breathing about 2 times a min. EMS bagged the pt and placed an NPA. After about 2 mins pt started breathing on her own and pulled out her NPA.

## 2017-01-06 NOTE — H&P (Signed)
History and Physical    Julie Stein ZOX:[id number] DOB: [birth year] DOA: 01/06/2017   PCP: Concepcion Living, NP   Patient coming from:  Home    Chief Complaint: Syncope   HPI: Julie Stein is a 82 y.o. female with extensive cardiac history listed below including systolic heart failure, cardiomyopathy, CAD, hypertension, Stroke in 2016,  SVT, questionable brought by [facility] after having been found unresponsive by daughter after a syncopal episode lasting  about 15 minutes "she was breathing about 2 times a minute", then improved after nasal airway and bag were placed. History is obtained by her daughter, as the patient has dementia, unable to provide. No apaprentt infections, or sick contacts. The patient has been in a steady clinical decline over the last 2 to 3 months. She has not been able to eat, or drink as much as before, in fact, her cardiologist recently placed her on Megace to improve her appetite. No nausea or vomiting. No diarrhea. She has a history of anemia, and has been taking Epo injections at the cancer center. Of note, the patient has not been complaining of chest pain or palpitations. She has a history of vasovagal syndrome in Oct of 2017,.No respiratory complaints. No lower extremity swelling. No apparent unilateral weakness or decrease in sensation or headaches or blurred vision. She may have had left facial droop.  No fever chills or night sweats.She walks with a walker.   ED Course:  BP 126/83   Pulse 81   Resp 15   Wt 35.4 kg (78 lb)   SpO2 100%   BMI 15.75 kg/m    sodium 147 potassium 2.9  hemoglobin 9.3   UA  rare bacteria and hazy urine culture pending EKG sinus rhythm  Review of Systems: As per HPI otherwise 10 point review of systems negative.   Past Medical History:  Diagnosis Date  . CAD (coronary artery disease)   . Cardiomyopathy (HCC)   . CHF (congestive heart failure) (HCC)   . Hypertension   . Stroke (HCC)   . SVT (supraventricular tachycardia) (HCC)      Past Surgical History:  Procedure Laterality Date  . CARDIAC CATHETERIZATION N/A 08/27/2015   Procedure: Right/Left Heart Cath and Coronary Angiography;  Surgeon: Laurey Morale, MD;  Location: [facility] INVASIVE CV LAB;  Service: Cardiovascular;  Laterality: N/A;  . ELECTROPHYSIOLOGIC STUDY N/A 09/05/2015   Procedure: SVT Ablation;  Surgeon: Will Jorja Loa, MD;  Location: [facility] INVASIVE CV LAB;  Service: Cardiovascular;  Laterality: N/A;  . KNEE SURGERY      Social History Social History   Social History  . Marital status: Married    Spouse name: N/A  . Number of children: N/A  . Years of education: N/A   Occupational History  . Not on file.   Social History Main Topics  . Smoking status: Never Smoker  . Smokeless tobacco: Never Used  . Alcohol use No  . Drug use: No  . Sexual activity: No   Other Topics Concern  . Not on file   Social History Narrative  . No narrative on file     Allergies  Allergen Reactions  . Demerol [Meperidine] Other (See Comments)    Hallucinations, out of body experience    Family History  Problem Relation Age of Onset  . Hypertension Daughter       Prior to Admission medications   Medication Sig Start Date End Date Taking? Authorizing Provider  aspirin EC 81 MG EC tablet Take 1 tablet (  81 mg total) by mouth daily. 09/06/15  Yes Maryann Mikhail, DO  carvedilol (COREG) 12.5 MG tablet Take 1 tablet (12.5 mg total) by mouth 2 (two) times daily with a meal. 01/02/17  Yes Laurey Morale, MD  hydrALAZINE (APRESOLINE) 25 MG tablet Take 0.5 tablets (12.5 mg total) by mouth 3 (three) times daily. 01/02/17  Yes Laurey Morale, MD  isosorbide dinitrate (ISORDIL) 20 MG tablet Take 1 tablet (20 mg total) by mouth 3 (three) times daily. 01/02/17  Yes Laurey Morale, MD  megestrol (MEGACE) 400 MG/10ML suspension Take 10 mLs (400 mg total) by mouth daily. Patient taking differently: Take 200-400 mg by mouth daily.  09/09/16  Yes Henrietta Hoover, NP   nitroGLYCERIN (NITROSTAT) 0.4 MG SL tablet Place 1 tablet (0.4 mg total) under the tongue every 5 (five) minutes as needed for chest pain. 09/10/15  Yes Daniel J Angiulli, PA-C  spironolactone (ALDACTONE) 25 MG tablet Take 1 tablet (25 mg total) by mouth daily. 01/02/17  Yes Laurey Morale, MD    Physical Exam:  Vitals:   01/06/17 1545 01/06/17 1616 01/06/17 1645 01/06/17 1700  BP:  123/87  126/83  Pulse: 76 81    Resp: 12 17  15   SpO2: 100% 100% 100%   Weight:       Constitutional: NAD, calm, comfortable, awake.  Eyes: PERRL, lids and conjunctivae normal ENMT: Mucous membranes are moist, without exudate or lesions  Neck: normal, supple, no masses, no thyromegaly Respiratory: clear to auscultation bilaterally, no wheezing, no crackles. Normal respiratory effort  Cardiovascular: Regular rate and rhythm with intermittent ectopic beats , no murmurs, rubs or gallops. Trace pedal edema. 2+ pedal pulses. No carotid bruits.  Abdomen: Soft, non tender, No hepatosplenomegaly. Bowel sounds positive.  Musculoskeletal: no clubbing / cyanosis. Moves all extremities Skin: no jaundice, No lesions.  Neurologic: Sensation intact  Strength equal in all extremities.      Labs on Admission: I have personally reviewed following labs and imaging studies  CBC:  Recent Labs Lab 01/06/17 1309  WBC 4.4  HGB 9.3*  HCT 29.0*  MCV 80.8  PLT 270    Basic Metabolic Panel:  Recent Labs Lab 01/02/17 1203 01/06/17 1309  NA 147* 147*  K 2.8* 2.9*  CL 109 110  CO2 31 28  GLUCOSE 86 81  BUN 14 17  CREATININE 0.79 0.76  CALCIUM 8.8* 8.4*    GFR: Estimated Creatinine Clearance: 31.9 mL/min (by C-G formula based on SCr of 0.76 mg/dL).  Liver Function Tests:  Recent Labs Lab 01/02/17 1203  AST 18  ALT 10*  ALKPHOS 53  BILITOT 0.7  PROT 5.7*  ALBUMIN 2.2*   No results for input(s): LIPASE, AMYLASE in the last 168 hours. No results for input(s): AMMONIA in the last 168  hours.  Coagulation Profile: No results for input(s): INR, PROTIME in the last 168 hours.  Cardiac Enzymes: No results for input(s): CKTOTAL, CKMB, CKMBINDEX, TROPONINI in the last 168 hours.  BNP (last 3 results) No results for input(s): PROBNP in the last 8760 hours.  HbA1C: No results for input(s): HGBA1C in the last 72 hours.  CBG:  Recent Labs Lab 01/06/17 1259  GLUCAP 70    Lipid Profile: No results for input(s): CHOL, HDL, LDLCALC, TRIG, CHOLHDL, LDLDIRECT in the last 72 hours.  Thyroid Function Tests: No results for input(s): TSH, T4TOTAL, FREET4, T3FREE, THYROIDAB in the last 72 hours.  Anemia Panel: No results for input(s): VITAMINB12, FOLATE, FERRITIN, TIBC,  IRON, RETICCTPCT in the last 72 hours.  Urine analysis:    Component Value Date/Time   COLORURINE YELLOW 01/06/2017 1448   APPEARANCEUR HAZY (A) 01/06/2017 1448   LABSPEC 1.021 01/06/2017 1448   LABSPEC 1.010 11/26/2016 1301   PHURINE 6.0 01/06/2017 1448   GLUCOSEU NEGATIVE 01/06/2017 1448   GLUCOSEU Negative 11/26/2016 1301   HGBUR NEGATIVE 01/06/2017 1448   BILIRUBINUR NEGATIVE 01/06/2017 1448   BILIRUBINUR Negative 11/26/2016 1301   KETONESUR NEGATIVE 01/06/2017 1448   PROTEINUR 30 (A) 01/06/2017 1448   UROBILINOGEN 0.2 11/26/2016 1301   NITRITE NEGATIVE 01/06/2017 1448   LEUKOCYTESUR NEGATIVE 01/06/2017 1448   LEUKOCYTESUR Trace 11/26/2016 1301    Sepsis Labs: @LABRCNTIP (procalcitonin:4,lacticidven:4) )No results found for this or any previous visit (from the past 240 hour(s)).   Radiological Exams on Admission: Dg Chest 2 View  Result Date: 01/06/2017 CLINICAL DATA:  82 year old female without chest pain. Confusion. Initial encounter. EXAM: CHEST  2 VIEW COMPARISON:  08/31/2015. FINDINGS: No infiltrate, congestive heart failure or pneumothorax. Blunting posterior sulci suggestive of small pleural effusions/pleural thickening. No plain film evidence of pulmonary malignancy. Scoliosis with  distortion of the mediastinal and cardiac silhouette. Heart size within normal limits. Calcified tortuous aorta. Bones are osteopenic. IMPRESSION: Blunting posterior sulci may represent presence of small pleural effusions or pleural thickening. No segmental infiltrate or congestive heart failure. Aortic atherosclerosis. Electronically Signed   By: Lacy Duverney M.D.   On: 01/06/2017 14:12   Ct Head Wo Contrast  Result Date: 01/06/2017 CLINICAL DATA:  82 year old female syncope with fall and confusion. Initial encounter. EXAM: CT HEAD WITHOUT CONTRAST TECHNIQUE: Contiguous axial images were obtained from the base of the skull through the vertex without intravenous contrast. COMPARISON:  08/28/2015 MR and 08/27/2015 CT. FINDINGS: Brain: No intracranial hemorrhage. Chronic microvascular changes without CT evidence of large acute infarct. Mild global atrophy without hydrocephalus. No intracranial mass lesion noted on this unenhanced exam. Vascular: Vascular calcifications. Skull: No skull fracture. Sinuses/Orbits: No acute orbital abnormality. Partial opacification right mastoid air cells without obstructing lesion of eustachian tube noted. Minimal mucosal thickening right maxillary sinus. Other: Negative IMPRESSION: No skull fracture or intracranial hemorrhage. Chronic microvascular changes without CT evidence of large acute infarct. Partial opacification right mastoid air cells. Minimal mucosal thickening right maxillary sinus. Electronically Signed   By: Lacy Duverney M.D.   On: 01/06/2017 14:28    EKG: Independently reviewed.  Assessment/Plan Active Problems:   Syncope   Essential hypertension   Acute systolic CHF (congestive heart failure), NYHA class 4 (HCC)   Paroxysmal SVT (supraventricular tachycardia) (HCC)   Chronic systolic heart failure (HCC)   Microcytic hypochromic anemia   H/O: CVA (cerebrovascular accident)   Anemia due to stage 4 chronic kidney disease treated with darbepoetin (HCC)    Protein-calorie malnutrition, moderate (HCC)   Physical deconditioning   Confusion   Altered mental status   Hypokalemia due to inadequate potassium intake   Syncope, likely multifactorial given strong cardiac history, in addition to poor oral intake, dehydration and +-med induced. versus CVA versus seizure . EKG unrevealing. Neuro exam at baseline. Unlikely seizure related, does not appear postictal . CT head negative for acute intracranial abnormalities. Afebrile. Received IVF bolus with increased alertness. Syncope order set  -admit for observation - Tele bed. -2 D echo IV fluids Check orthostatics  EKG in am CXR  Hold Beta blockers and other BP meds. Cards to see patient today as requested by EDP In addition, will check MRI head r/o CVA  EEG r/o seizure  Chronic  Systolic CHF  CXR without CHF Last 2 D echo in 1/17 with EF 60-65. Weight 35.4 kg  Cards to recommend meds adjustment  monitor I/Os and daily weights prn 02  History of CVA MRI head on 08/2015 small infarcts in the watershed region, likely related to HTN . New CT head neg for acute intracranial abnormalities .  Symptoms unlikely to be related to new stroke but will check MRI brain to confirm absence of CVA Continue to monitor    Hypertension BP 126/83   Pulse 81   Controlled   Continue home anti-hypertensive medications   Chronic kidney disease stage  2    baseline creatinine  0.7 at baseline  Current Cr  Lab Results  Component Value Date   CREATININE 0.76 01/06/2017   CREATININE 0.79 01/02/2017   CREATININE 0.7 11/25/2016  IVF Repeat CMET in am  Hypokalemia, may be due to diuretics and poor po intake.Current K 2.9 . EKG unrevealing.   Received  meq IVx1 Oral replenishment with 40 meq oral x1  And IVF at D5 with 40 meq K  Repeat CMET in am  Hypernatremia - sodium 147  Cr 0.76  on admission at baseline, likely to poor fluid  intake . Expected to improve after IVF  Check a.m. CMET   Anemia of chronic  disease Hemoglobin on admission  9.3 at [facility]  Repeat CBC in am Patient on Epo as OP, due for next does in March at the [facility]   Weight loss, anorexia, deconditioning.  Wt 35.4 kg Continue Megace  Push more oral fluid PT/ OT  May need nutrition consult   Dementia Clinically her mental status is slowly declining, she is to follow with Neuro as OP   DVT prophylaxis:  Heparin   Code Status:   Full      Family Communication:  Discussed with patient daughter Disposition Plan: Expect patient to be discharged to home after condition improves Consults called:   Cards as per EDP  Admission status:Tele Obs   Marlowe Kays E, PA-C [facility]   01/06/2017, 5:34 PM

## 2017-01-07 ENCOUNTER — Ambulatory Visit: Payer: Medicare Other | Admitting: Neurology

## 2017-01-07 ENCOUNTER — Observation Stay (HOSPITAL_COMMUNITY): Payer: Medicare Other

## 2017-01-07 ENCOUNTER — Observation Stay (HOSPITAL_BASED_OUTPATIENT_CLINIC_OR_DEPARTMENT_OTHER): Payer: Medicare Other

## 2017-01-07 DIAGNOSIS — E876 Hypokalemia: Secondary | ICD-10-CM

## 2017-01-07 DIAGNOSIS — L89152 Pressure ulcer of sacral region, stage 2: Secondary | ICD-10-CM | POA: Diagnosis present

## 2017-01-07 DIAGNOSIS — L8990 Pressure ulcer of unspecified site, unspecified stage: Secondary | ICD-10-CM | POA: Insufficient documentation

## 2017-01-07 DIAGNOSIS — I950 Idiopathic hypotension: Secondary | ICD-10-CM | POA: Diagnosis not present

## 2017-01-07 DIAGNOSIS — Z7982 Long term (current) use of aspirin: Secondary | ICD-10-CM | POA: Diagnosis not present

## 2017-01-07 DIAGNOSIS — R0600 Dyspnea, unspecified: Secondary | ICD-10-CM

## 2017-01-07 DIAGNOSIS — Z8249 Family history of ischemic heart disease and other diseases of the circulatory system: Secondary | ICD-10-CM | POA: Diagnosis not present

## 2017-01-07 DIAGNOSIS — R4182 Altered mental status, unspecified: Secondary | ICD-10-CM | POA: Diagnosis not present

## 2017-01-07 DIAGNOSIS — E440 Moderate protein-calorie malnutrition: Secondary | ICD-10-CM | POA: Diagnosis not present

## 2017-01-07 DIAGNOSIS — R410 Disorientation, unspecified: Secondary | ICD-10-CM | POA: Diagnosis not present

## 2017-01-07 DIAGNOSIS — N184 Chronic kidney disease, stage 4 (severe): Secondary | ICD-10-CM

## 2017-01-07 DIAGNOSIS — R627 Adult failure to thrive: Secondary | ICD-10-CM | POA: Diagnosis present

## 2017-01-07 DIAGNOSIS — D631 Anemia in chronic kidney disease: Secondary | ICD-10-CM

## 2017-01-07 DIAGNOSIS — I130 Hypertensive heart and chronic kidney disease with heart failure and stage 1 through stage 4 chronic kidney disease, or unspecified chronic kidney disease: Secondary | ICD-10-CM | POA: Diagnosis present

## 2017-01-07 DIAGNOSIS — R64 Cachexia: Secondary | ICD-10-CM | POA: Diagnosis present

## 2017-01-07 DIAGNOSIS — Z8673 Personal history of transient ischemic attack (TIA), and cerebral infarction without residual deficits: Secondary | ICD-10-CM | POA: Diagnosis not present

## 2017-01-07 DIAGNOSIS — R6889 Other general symptoms and signs: Secondary | ICD-10-CM | POA: Diagnosis not present

## 2017-01-07 DIAGNOSIS — I5022 Chronic systolic (congestive) heart failure: Secondary | ICD-10-CM | POA: Diagnosis not present

## 2017-01-07 DIAGNOSIS — Z885 Allergy status to narcotic agent status: Secondary | ICD-10-CM | POA: Diagnosis not present

## 2017-01-07 DIAGNOSIS — R29898 Other symptoms and signs involving the musculoskeletal system: Secondary | ICD-10-CM | POA: Diagnosis not present

## 2017-01-07 DIAGNOSIS — Z681 Body mass index (BMI) 19 or less, adult: Secondary | ICD-10-CM | POA: Diagnosis not present

## 2017-01-07 DIAGNOSIS — F329 Major depressive disorder, single episode, unspecified: Secondary | ICD-10-CM | POA: Diagnosis present

## 2017-01-07 DIAGNOSIS — F0390 Unspecified dementia without behavioral disturbance: Secondary | ICD-10-CM | POA: Diagnosis present

## 2017-01-07 DIAGNOSIS — R5381 Other malaise: Secondary | ICD-10-CM | POA: Diagnosis not present

## 2017-01-07 DIAGNOSIS — D509 Iron deficiency anemia, unspecified: Secondary | ICD-10-CM | POA: Diagnosis not present

## 2017-01-07 DIAGNOSIS — R55 Syncope and collapse: Secondary | ICD-10-CM | POA: Diagnosis present

## 2017-01-07 DIAGNOSIS — I10 Essential (primary) hypertension: Secondary | ICD-10-CM | POA: Diagnosis not present

## 2017-01-07 DIAGNOSIS — I959 Hypotension, unspecified: Secondary | ICD-10-CM | POA: Diagnosis present

## 2017-01-07 DIAGNOSIS — I429 Cardiomyopathy, unspecified: Secondary | ICD-10-CM | POA: Diagnosis present

## 2017-01-07 DIAGNOSIS — I471 Supraventricular tachycardia: Secondary | ICD-10-CM | POA: Diagnosis present

## 2017-01-07 DIAGNOSIS — N182 Chronic kidney disease, stage 2 (mild): Secondary | ICD-10-CM | POA: Diagnosis present

## 2017-01-07 DIAGNOSIS — E870 Hyperosmolality and hypernatremia: Secondary | ICD-10-CM | POA: Diagnosis present

## 2017-01-07 DIAGNOSIS — D638 Anemia in other chronic diseases classified elsewhere: Secondary | ICD-10-CM | POA: Diagnosis present

## 2017-01-07 DIAGNOSIS — E860 Dehydration: Secondary | ICD-10-CM | POA: Diagnosis present

## 2017-01-07 DIAGNOSIS — I2510 Atherosclerotic heart disease of native coronary artery without angina pectoris: Secondary | ICD-10-CM | POA: Diagnosis present

## 2017-01-07 LAB — GLUCOSE, CAPILLARY: Glucose-Capillary: 90 mg/dL (ref 65–99)

## 2017-01-07 LAB — ECHOCARDIOGRAM COMPLETE
Area-P 1/2: 1.57 cm2
CHL CUP MV DEC (S): 331
CHL CUP RV SYS PRESS: 26 mmHg
E decel time: 331 msec
EERAT: 12.67
FS: 44 % (ref 28–44)
Height: 59 in
IV/PV OW: 1.1
LA diam end sys: 28 mm
LA vol A4C: 26.3 ml
LA vol: 26.8 mL
LADIAMINDEX: 2.3 cm/m2
LASIZE: 28 mm
LAVOLIN: 22 mL/m2
LDCA: 2.54 cm2
LV E/e' medial: 12.67
LV dias vol index: 24 mL/m2
LV sys vol: 10 mL — AB (ref 14–42)
LVDIAVOL: 29 mL — AB (ref 46–106)
LVEEAVG: 12.67
LVELAT: 6.85 cm/s
LVOT SV: 50 mL
LVOT VTI: 19.5 cm
LVOT diameter: 18 mm
LVOT peak grad rest: 4 mmHg
LVOTPV: 106 cm/s
LVSYSVOLIN: 8 mL/m2
Lateral S' vel: 14.8 cm/s
MV pk A vel: 123 m/s
MV pk E vel: 86.8 m/s
MVPG: 3 mmHg
P 1/2 time: 97 ms
PW: 10 mm — AB (ref 0.6–1.1)
RV TAPSE: 23.7 mm
Reg peak vel: 239 cm/s
Simpson's disk: 65
Stroke v: 19 ml
TDI e' lateral: 6.85
TDI e' medial: 6.09
TR max vel: 239 cm/s
Weight: 1272 oz

## 2017-01-07 LAB — COMPREHENSIVE METABOLIC PANEL
ALK PHOS: 52 U/L (ref 38–126)
ALT: 10 U/L — ABNORMAL LOW (ref 14–54)
AST: 20 U/L (ref 15–41)
Albumin: 2.1 g/dL — ABNORMAL LOW (ref 3.5–5.0)
Anion gap: 11 (ref 5–15)
BILIRUBIN TOTAL: 0.6 mg/dL (ref 0.3–1.2)
BUN: 13 mg/dL (ref 6–20)
CALCIUM: 8.6 mg/dL — AB (ref 8.9–10.3)
CO2: 24 mmol/L (ref 22–32)
Chloride: 112 mmol/L — ABNORMAL HIGH (ref 101–111)
Creatinine, Ser: 0.78 mg/dL (ref 0.44–1.00)
GFR calc Af Amer: >60 mL/min (ref >60)
Glucose, Bld: 82 mg/dL (ref 65–99)
POTASSIUM: 2.8 mmol/L — AB (ref 3.5–5.1)
Sodium: 147 mmol/L — ABNORMAL HIGH (ref 135–145)
Total Protein: 5.1 g/dL — ABNORMAL LOW (ref 6.5–8.1)

## 2017-01-07 LAB — CBC
HCT: 30.1 % — ABNORMAL LOW (ref 36.0–46.0)
HEMOGLOBIN: 10 g/dL — AB (ref 12.0–15.0)
MCH: 26.5 pg (ref 26.0–34.0)
MCHC: 33.2 g/dL (ref 30.0–36.0)
MCV: 79.6 fL (ref 78.0–100.0)
Platelets: 286 10*3/uL (ref 150–400)
RBC: 3.78 MIL/uL — AB (ref 3.87–5.11)
RDW: 17.7 % — ABNORMAL HIGH (ref 11.5–15.5)
WBC: 6.2 10*3/uL (ref 4.0–10.5)

## 2017-01-07 LAB — URINE CULTURE: Culture: NO GROWTH

## 2017-01-07 LAB — AMMONIA: Ammonia: 18 umol/L (ref 9–35)

## 2017-01-07 LAB — MAGNESIUM: MAGNESIUM: 2.4 mg/dL (ref 1.7–2.4)

## 2017-01-07 MED ORDER — ADULT MULTIVITAMIN LIQUID CH
15.0000 mL | Freq: Every day | ORAL | Status: DC
  Administered 2017-01-07 – 2017-01-09 (×3): 15 mL via ORAL
  Filled 2017-01-07 (×3): qty 15

## 2017-01-07 MED ORDER — POTASSIUM CHLORIDE CRYS ER 20 MEQ PO TBCR
40.0000 meq | EXTENDED_RELEASE_TABLET | Freq: Three times a day (TID) | ORAL | Status: DC
  Administered 2017-01-07 (×3): 40 meq via ORAL
  Filled 2017-01-07 (×3): qty 2

## 2017-01-07 MED ORDER — CARVEDILOL 6.25 MG PO TABS
6.2500 mg | ORAL_TABLET | Freq: Two times a day (BID) | ORAL | Status: DC
  Administered 2017-01-07 – 2017-01-09 (×4): 6.25 mg via ORAL
  Filled 2017-01-07 (×4): qty 1

## 2017-01-07 MED ORDER — JUVEN PO PACK
1.0000 | PACK | Freq: Two times a day (BID) | ORAL | Status: DC
  Administered 2017-01-07 – 2017-01-08 (×3): 1 via ORAL
  Filled 2017-01-07 (×4): qty 1

## 2017-01-07 MED ORDER — POTASSIUM CHLORIDE 20 MEQ/15ML (10%) PO SOLN
40.0000 meq | Freq: Three times a day (TID) | ORAL | Status: DC
  Administered 2017-01-07: 40 meq via ORAL
  Filled 2017-01-07: qty 30

## 2017-01-07 MED ORDER — BOOST / RESOURCE BREEZE PO LIQD
1.0000 | Freq: Two times a day (BID) | ORAL | Status: DC
  Administered 2017-01-08 – 2017-01-09 (×3): 1 via ORAL

## 2017-01-07 MED ORDER — ENSURE ENLIVE PO LIQD
237.0000 mL | Freq: Two times a day (BID) | ORAL | Status: DC
  Administered 2017-01-07 – 2017-01-09 (×3): 237 mL via ORAL

## 2017-01-07 NOTE — Progress Notes (Signed)
Patient stable from 7p-12a. Float nurse will be taking over care for the remainder of the shift.

## 2017-01-07 NOTE — Progress Notes (Signed)
Initial Nutrition Assessment  DOCUMENTATION CODES:   Non-severe (moderate) malnutrition in context of chronic illness, Underweight  INTERVENTION:  Provide Boost Breeze po BID with meals, each supplement provides 250 kcal and 9 grams of protein  Provide Magic Cup ice cream once daily with dinner, supplement provides 290 kcal and 9 grams of protein.   NUTRITION DIAGNOSIS:   Malnutrition related to poor appetite, lethargy/confusion as evidenced by energy intake < or equal to 75% for > or equal to 1 month, moderate depletion of body fat, moderate depletions of muscle mass.   GOAL:   Patient will meet greater than or equal to 90% of their needs   MONITOR:   PO intake, Supplement acceptance, Skin, Weight trends, Labs, I & O's  REASON FOR ASSESSMENT:   Malnutrition Screening Tool    ASSESSMENT:   82 y.o. female with dementia, systolic heart failure, cardiomyopathy, CAD, hypertension, Stroke in 2016,  SVT, questionable brought by [facility] after having been found unresponsive by daughter after a syncopal episode lasting  about 15 minutes "she was breathing about 2 times a minute", then improved after nasal airway and bag were placed.   Pt unable to provide history due to dementia. Pt's daughter reports that patient has been eating very poorly for the past several months due to poor appetite and limited attentions span with meals. Pt eats best at breakfast eating an egg, a couple french toast stick and beef sausage, but PO intake worsens through the day. Daughter estimates that patient consumes 500 kcal most days and on a really good day, may eat 1000 kcal. They try offering Ensure supplements, but pt doesn't drink much.   Labs: low hemoglobin, low potassium, high sodium  Diet Order:  Diet regular Room service appropriate? Yes; Fluid consistency: Thin  Skin:  Wound (see comment) (stage II PI on sacrum)  Last BM:  2/20  Height:   Ht Readings from Last 1 Encounters:  01/06/17 4\' 11"   (1.499 m)    Weight:   Wt Readings from Last 1 Encounters:  01/07/17 79 lb 8 oz (36.1 kg)    Ideal Body Weight:  44.7 kg  BMI:  Body mass index is 16.06 kg/m.  Estimated Nutritional Needs:   Kcal:  1300-1500  Protein:  50-60 grams  Fluid:  1.3-1.5 L/day  EDUCATION NEEDS:   No education needs identified at this time  Dorothea Ogle RD, LDN, CSP Inpatient Clinical Dietitian Pager: [phone] After Hours Pager: [phone]

## 2017-01-07 NOTE — Progress Notes (Signed)
*  PRELIMINARY RESULTS* Echocardiogram 2D Echocardiogram has been performed.  Stacey Drain 01/07/2017, 10:51 AM

## 2017-01-07 NOTE — Care Management Note (Signed)
Case Management Note  Patient Details  Name: Julie Stein MRN: [id number] Date of Birth: 1934-12-28  Subjective/Objective:      Admitted with HTN              Action/Plan: Patient lives at home with her daughter; goes to the [facility] for primary care and as her pharmacy; she also has Medicare / Medicaid for [employer]; Patient is active with [facility] for HHRN,PT, nurses adie ans ST; a list of personal care services was given to her daughter; DME - she has a walker at home; CM will continue to follow for DCP  Expected Discharge Date:    possibly 01/08/2017              Expected Discharge Plan:  Home w Home Health Services    Discharge planning Services  CM Consult     Choice offered to:  Adult Children  HH Arranged:  RN, PT, Nurse's Aide, Speech Therapy HH Agency:     Status of Service:  In process, will continue to follow  Reola Mosher [phone] 01/07/2017, 9:36 AM

## 2017-01-07 NOTE — Progress Notes (Addendum)
Advanced Heart Failure Rounding Note   Subjective:    Admitted with syncope. Some question of siezure activity. EEG results pending. BP soft so HF meds held.   Poor appetite. Denies SOB.   MRI Brain- Atrophy with moderate chronic microvascular ischemia. Multiple areas of chronic microhemorrhage in the brain consistent with the history of hypertension.  CT Head-Chronic microvascular changes without CT evidence of large acute infarct.     Objective:   Weight Range:  Vital Signs:   Temp:  [97.9 F (36.6 C)-98.9 F (37.2 C)] 98.1 F (36.7 C) (02/21 0731) Pulse Rate:  [70-85] 85 (02/21 0731) Resp:  [12-18] 18 (02/21 0731) BP: (97-139)/(67-87) 124/67 (02/21 0731) SpO2:  [97 %-100 %] 100 % (02/21 0731) Weight:  [75 lb 8 oz (34.2 kg)-79 lb 8 oz (36.1 kg)] 79 lb 8 oz (36.1 kg) (02/21 0543) Last BM Date: 01/06/17  Weight change: Filed Weights   01/06/17 1233 01/06/17 1809 01/07/17 0543  Weight: 78 lb (35.4 kg) 75 lb 8 oz (34.2 kg) 79 lb 8 oz (36.1 kg)    Intake/Output:   Intake/Output Summary (Last 24 hours) at 01/07/17 1046 Last data filed at 01/07/17 0842  Gross per 24 hour  Intake           839.17 ml  Output                0 ml  Net           839.17 ml     Physical Exam: General:  Cachetic. Pale. In bed.  HEENT: normal Neck: supple. JVP flat. Carotids 2+ bilat; no bruits. No lymphadenopathy or thryomegaly appreciated. Cor: PMI nondisplaced. Regular rate & rhythm. No rubs, gallops or murmurs. Lungs: clear Abdomen: soft, nontender, nondistended. No hepatosplenomegaly. No bruits or masses. Good bowel sounds. Extremities: no cyanosis, clubbing, rash, edema Neuro: alert & orientedx3, cranial nerves grossly intact. moves all 4 extremities w/o difficulty. Affect pleasant  Telemetry: NSR 80s   Labs: Basic Metabolic Panel:  Recent Labs Lab 01/02/17 1203 01/06/17 1309 01/07/17 0622  NA 147* 147* 147*  K 2.8* 2.9* 2.8*  CL 109 110 112*  CO2 '31 28 24   '$ GLUCOSE 86 81 82  BUN '14 17 13  '$ CREATININE 0.79 0.76 0.78  CALCIUM 8.8* 8.4* 8.6*    Liver Function Tests:  Recent Labs Lab 01/02/17 1203 01/07/17 0622  AST 18 20  ALT 10* 10*  ALKPHOS 53 52  BILITOT 0.7 0.6  PROT 5.7* 5.1*  ALBUMIN 2.2* 2.1*   No results for input(s): LIPASE, AMYLASE in the last 168 hours. No results for input(s): AMMONIA in the last 168 hours.  CBC:  Recent Labs Lab 01/06/17 1309 01/07/17 0622  WBC 4.4 6.2  HGB 9.3* 10.0*  HCT 29.0* 30.1*  MCV 80.8 79.6  PLT 270 286    Cardiac Enzymes: No results for input(s): CKTOTAL, CKMB, CKMBINDEX, TROPONINI in the last 168 hours.  BNP: BNP (last 3 results) No results for input(s): BNP in the last 8760 hours.  ProBNP (last 3 results) No results for input(s): PROBNP in the last 8760 hours.    Other results:  Imaging: Dg Chest 2 View  Result Date: 01/06/2017 CLINICAL DATA:  82 year old female without chest pain. Confusion. Initial encounter. EXAM: CHEST  2 VIEW COMPARISON:  08/31/2015. FINDINGS: No infiltrate, congestive heart failure or pneumothorax. Blunting posterior sulci suggestive of small pleural effusions/pleural thickening. No plain film evidence of pulmonary malignancy. Scoliosis with distortion of the mediastinal and  cardiac silhouette. Heart size within normal limits. Calcified tortuous aorta. Bones are osteopenic. IMPRESSION: Blunting posterior sulci may represent presence of small pleural effusions or pleural thickening. No segmental infiltrate or congestive heart failure. Aortic atherosclerosis. Electronically Signed   By: Genia Del M.D.   On: 01/06/2017 14:12   Ct Head Wo Contrast  Result Date: 01/06/2017 CLINICAL DATA:  82 year old female syncope with fall and confusion. Initial encounter. EXAM: CT HEAD WITHOUT CONTRAST TECHNIQUE: Contiguous axial images were obtained from the base of the skull through the vertex without intravenous contrast. COMPARISON:  08/28/2015 MR and 08/27/2015  CT. FINDINGS: Brain: No intracranial hemorrhage. Chronic microvascular changes without CT evidence of large acute infarct. Mild global atrophy without hydrocephalus. No intracranial mass lesion noted on this unenhanced exam. Vascular: Vascular calcifications. Skull: No skull fracture. Sinuses/Orbits: No acute orbital abnormality. Partial opacification right mastoid air cells without obstructing lesion of eustachian tube noted. Minimal mucosal thickening right maxillary sinus. Other: Negative IMPRESSION: No skull fracture or intracranial hemorrhage. Chronic microvascular changes without CT evidence of large acute infarct. Partial opacification right mastoid air cells. Minimal mucosal thickening right maxillary sinus. Electronically Signed   By: Genia Del M.D.   On: 01/06/2017 14:28   Mri Brain Without Contrast  Result Date: 01/06/2017 CLINICAL DATA:  Syncope.  Hypertension. EXAM: MRI HEAD WITHOUT CONTRAST TECHNIQUE: Multiplanar, multiecho pulse sequences of the brain and surrounding structures were obtained without intravenous contrast. COMPARISON:  CT head 01/06/2017.  MRI 08/28/2015 FINDINGS: Brain: Negative for acute infarct. Atrophy with chronic microvascular ischemic change throughout the white matter. Chronic infarct in the anterior corpus callosum. Small chronic infarct in the right medial parietal lobe. Multiple areas of chronic microhemorrhage throughout the brain consistent with the history of hypertension. These were present previously. No mass or fluid collection. No shift of the midline structures. Vascular: Normal arterial flow voids. Skull and upper cervical spine: Negative Sinuses/Orbits: Mucosal edema paranasal sinuses. Bilateral mastoid sinus effusion. Negative orbit. Other: None IMPRESSION: No acute intracranial abnormality Atrophy with moderate chronic microvascular ischemia. Multiple areas of chronic microhemorrhage in the brain consistent with the history of hypertension. Electronically  Signed   By: Franchot Gallo M.D.   On: 01/06/2017 20:07      Medications:     Scheduled Medications: . aspirin EC  81 mg Oral Daily  . feeding supplement (ENSURE ENLIVE)  237 mL Oral BID BM  . heparin  5,000 Units Subcutaneous Q8H  . megestrol  400 mg Oral Daily  . potassium chloride  40 mEq Oral TID  . potassium chloride  40 mEq Oral Once  . sodium chloride flush  3 mL Intravenous Q12H     Infusions: . dextrose 5 % and 0.45 % NaCl with KCl 40 mEq/L 50 mL/hr at 01/06/17 2025     PRN Medications:  acetaminophen **OR** acetaminophen, bisacodyl, hydrALAZINE, ondansetron **OR** ondansetron (ZOFRAN) IV, senna-docusate   Assessment/Plan/Discussion.    1. Syncope--H/O syncope in past from SVT.  2.  Seizure Activity- EEG results pending.  2. Chronic Systolic Heart Failure  EF recovered from 20-25%-->11/2015 ECHO 60-65%. Grade IDD Continue to hold HF meds with soft BP. Does not appear volume overloaded.  3. Unexplained weight loss- > 30 pound weight loss. albumin 2.1  4. CAD: LHC 08/27/15 with 20% proximal LAD, 40% mid LAD, 90% distal LAD, large high OM1 with 80% ostial stenosis  5.  HTN- Stable today  6. H/O CVA-MRI head 08/28/15 showed small infarcts in watershed region.  7. Hypokalemia- K supplemented.  8. Confusion-  Would benefit from Palliative Care for [place].   Length of Stay: 0   Amy Clegg NP-C 01/07/2017, 10:46 AM  Advanced Heart Failure Team Pager [phone] (M-F; 7a - 4p)  Please contact [facility] for night-coverage after hours (4p -7a ) and weekends on [email or web address]  Patient seen with NP, agree with the above note.  1. Syncope: Cause uncertain.  Would be surprised if solely related to hypotension as I had recently decreased doses of multiple meds and she had had no syncopal episodes prior.  She has a history of SVT (possible AVNRT), cannot rule this out as a cause.   - Would get echo.  - Would leave off BP-active meds except restart Coreg at 6.25 mg bid given  history of SVT.  - Continue to monitor on telemetry.  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  Short (about 10 beats) run overnight.  Restart Coreg 6.25 mg bid.  3. Chronic systolic CHF: Mixed ischemic/nonischemic cardiomyopathy, HTN likely played a role.  Last echo in 1/17 with EF 60-65%.  Off meds now with syncopal episode. - Echo today to confirm increase in EF.  - Restart Coreg.  4. CAD: Stopped statin recently given worsened mental status (confusion, dementia) to see if this helps any.  Continue ASA 81 daily.  5. CVA: No new stroke on imaging.  6. Weight loss, fatigue, confusion, anemia: No localizing symptoms to suggest cancer.  ESR was normal.  Has been followed by hematology, have thought anemia of renal disease with elevated creatinine relative to her size.  She is going to be seeing a nephrologist.  Given confusion/memory difficulty, she will be seeing a neurologist.  We do not have a unifying diagnosis for her condition at this time. Check NH3.  7. Hypokalemia: Replace K.  ?If this could have triggered an arrhythmia.   Loralie Champagne 01/07/2017 1:33 PM

## 2017-01-07 NOTE — Progress Notes (Signed)
PROGRESS NOTE    Julie Stein  ZOX:[id number] DOB: [birth year] DOA: 01/06/2017 PCP: Concepcion Living, NP   Outpatient Specialists:     Brief Narrative:  Julie Stein is a 82 y.o. female with extensive cardiac history listed below including systolic heart failure, cardiomyopathy, CAD, hypertension, Stroke in 2016,  SVT, questionable brought by [facility] after having been found unresponsive by daughter after a syncopal episode lasting  about 15 minutes "she was breathing about 2 times a minute", then improved after nasal airway and bag were placed. History is obtained by her daughter, as the patient has dementia, unable to provide. No apaprentt infections, or sick contacts. The patient has been in a steady clinical decline over the last 2 to 3 months. She has not been able to eat, or drink as much as before, in fact, her cardiologist recently placed her on Megace to improve her appetite. No nausea or vomiting. No diarrhea. She has a history of anemia, and has been taking Epo injections at the cancer center. Of note, the patient has not been complaining of chest pain or palpitations. She has a history of vasovagal syndrome in Oct of 2017,.No respiratory complaints. No lower extremity swelling. No apparent unilateral weakness or decrease in sensation or headaches or blurred vision. She may have had left facial droop.  No fever chills or night sweats.She walks with a walker.   Assessment & Plan:   Active Problems:   Essential hypertension   Acute systolic CHF (congestive heart failure), NYHA class 4 (HCC)   Paroxysmal SVT (supraventricular tachycardia) (HCC)   Syncope   Chronic systolic heart failure (HCC)   Microcytic hypochromic anemia   H/O: CVA (cerebrovascular accident)   Anemia due to stage 4 chronic kidney disease treated with darbepoetin (HCC)   Protein-calorie malnutrition, moderate (HCC)   Physical deconditioning   Confusion   Altered mental status   Hypokalemia due to inadequate potassium  intake   Hypotension   Stroke-like episode (HCC)   Pressure injury of skin   Syncope, likely multifactorial given strong cardiac history, in addition to poor oral intake, dehydration and +-med induced. versus CVA versus seizure . EKG unrevealing. Neuro exam at baseline. Unlikely seizure related, does not appear postictal . CT head negative for acute intracranial abnormalities. Afebrile.  -2 D echo IV fluids orthostatics appear not to have been done  MRI head negative for acute issues EEG r/o seizure  Chronic  Systolic CHF  CXR without CHF Last 2 D echo in 1/17 with EF 60-65. Weight 35.4 kg  Cards to recommend meds adjustment  monitor I/Os and daily weights prn 02  History of CVA  MRI negative for new CVA  Hypertension BP 126/83   Pulse 81   -cards to adjust meds  Chronic kidney disease stage  2   BMP daily  Hypokalemia -check Mg and replace both IVF and PO  Hypernatremia  -IVF and recheck in AM  Anemia of chronic disease Patient on Epo as OP, due for next does in March at the [facility]  Weight loss, anorexia, deconditioning.  Wt 35.4 kg Continue Megace  ? Need for palliative care consult as declining over last several months  Dementia Clinically her mental status is slowly declining, she is to follow with Neuro as OP     DVT prophylaxis:  SQ Heparin  Code Status: Full Code   Family Communication: daughter  Disposition Plan:     Consultants:   cards    Subjective: No complaints  Objective: Vitals:  01/06/17 2022 01/07/17 0012 01/07/17 0543 01/07/17 0731  BP: 125/77 129/71 139/80 124/67  Pulse: 77 77 84 85  Resp: 16 16 18 18   Temp: 97.9 F (36.6 C) 98.4 F (36.9 C) 98.9 F (37.2 C) 98.1 F (36.7 C)  TempSrc: Oral Oral Oral Oral  SpO2: 100% 99% 100% 100%  Weight:   36.1 kg (79 lb 8 oz)   Height:        Intake/Output Summary (Last 24 hours) at 01/07/17 1112 Last data filed at 01/07/17 0842  Gross per 24 hour  Intake            839.17 ml  Output                0 ml  Net           839.17 ml   Filed Weights   01/06/17 1233 01/06/17 1809 01/07/17 0543  Weight: 35.4 kg (78 lb) 34.2 kg (75 lb 8 oz) 36.1 kg (79 lb 8 oz)    Examination:  General exam: Appears calm and comfortable  Family has turned me away from exam x 2-- needs to use bed pan and then needs to be "cleaned up"    Data Reviewed: I have personally reviewed following labs and imaging studies  CBC:  Recent Labs Lab 01/06/17 1309 01/07/17 0622  WBC 4.4 6.2  HGB 9.3* 10.0*  HCT 29.0* 30.1*  MCV 80.8 79.6  PLT 270 286   Basic Metabolic Panel:  Recent Labs Lab 01/02/17 1203 01/06/17 1309 01/07/17 0622  NA 147* 147* 147*  K 2.8* 2.9* 2.8*  CL 109 110 112*  CO2 31 28 24   GLUCOSE 86 81 82  BUN 14 17 13   CREATININE 0.79 0.76 0.78  CALCIUM 8.8* 8.4* 8.6*   GFR: Estimated Creatinine Clearance: 32.5 mL/min (by C-G formula based on SCr of 0.78 mg/dL). Liver Function Tests:  Recent Labs Lab 01/02/17 1203 01/07/17 0622  AST 18 20  ALT 10* 10*  ALKPHOS 53 52  BILITOT 0.7 0.6  PROT 5.7* 5.1*  ALBUMIN 2.2* 2.1*   No results for input(s): LIPASE, AMYLASE in the last 168 hours. No results for input(s): AMMONIA in the last 168 hours. Coagulation Profile: No results for input(s): INR, PROTIME in the last 168 hours. Cardiac Enzymes: No results for input(s): CKTOTAL, CKMB, CKMBINDEX, TROPONINI in the last 168 hours. BNP (last 3 results) No results for input(s): PROBNP in the last 8760 hours. HbA1C: No results for input(s): HGBA1C in the last 72 hours. CBG:  Recent Labs Lab 01/06/17 1259 01/07/17 0553  GLUCAP 70 90   Lipid Profile: No results for input(s): CHOL, HDL, LDLCALC, TRIG, CHOLHDL, LDLDIRECT in the last 72 hours. Thyroid Function Tests: No results for input(s): TSH, T4TOTAL, FREET4, T3FREE, THYROIDAB in the last 72 hours. Anemia Panel: No results for input(s): VITAMINB12, FOLATE, FERRITIN, TIBC, IRON,  RETICCTPCT in the last 72 hours. Urine analysis:    Component Value Date/Time   COLORURINE YELLOW 01/06/2017 1448   APPEARANCEUR HAZY (A) 01/06/2017 1448   LABSPEC 1.021 01/06/2017 1448   LABSPEC 1.010 11/26/2016 1301   PHURINE 6.0 01/06/2017 1448   GLUCOSEU NEGATIVE 01/06/2017 1448   GLUCOSEU Negative 11/26/2016 1301   HGBUR NEGATIVE 01/06/2017 1448   BILIRUBINUR NEGATIVE 01/06/2017 1448   BILIRUBINUR Negative 11/26/2016 1301   KETONESUR NEGATIVE 01/06/2017 1448   PROTEINUR 30 (A) 01/06/2017 1448   UROBILINOGEN 0.2 11/26/2016 1301   NITRITE NEGATIVE 01/06/2017 1448   LEUKOCYTESUR NEGATIVE 01/06/2017 1448  LEUKOCYTESUR Trace 11/26/2016 1301     )No results found for this or any previous visit (from the past 240 hour(s)).    Anti-infectives    None       Radiology Studies: Dg Chest 2 View  Result Date: 01/06/2017 CLINICAL DATA:  82 year old female without chest pain. Confusion. Initial encounter. EXAM: CHEST  2 VIEW COMPARISON:  08/31/2015. FINDINGS: No infiltrate, congestive heart failure or pneumothorax. Blunting posterior sulci suggestive of small pleural effusions/pleural thickening. No plain film evidence of pulmonary malignancy. Scoliosis with distortion of the mediastinal and cardiac silhouette. Heart size within normal limits. Calcified tortuous aorta. Bones are osteopenic. IMPRESSION: Blunting posterior sulci may represent presence of small pleural effusions or pleural thickening. No segmental infiltrate or congestive heart failure. Aortic atherosclerosis. Electronically Signed   By: Lacy Duverney M.D.   On: 01/06/2017 14:12   Ct Head Wo Contrast  Result Date: 01/06/2017 CLINICAL DATA:  82 year old female syncope with fall and confusion. Initial encounter. EXAM: CT HEAD WITHOUT CONTRAST TECHNIQUE: Contiguous axial images were obtained from the base of the skull through the vertex without intravenous contrast. COMPARISON:  08/28/2015 MR and 08/27/2015 CT. FINDINGS:  Brain: No intracranial hemorrhage. Chronic microvascular changes without CT evidence of large acute infarct. Mild global atrophy without hydrocephalus. No intracranial mass lesion noted on this unenhanced exam. Vascular: Vascular calcifications. Skull: No skull fracture. Sinuses/Orbits: No acute orbital abnormality. Partial opacification right mastoid air cells without obstructing lesion of eustachian tube noted. Minimal mucosal thickening right maxillary sinus. Other: Negative IMPRESSION: No skull fracture or intracranial hemorrhage. Chronic microvascular changes without CT evidence of large acute infarct. Partial opacification right mastoid air cells. Minimal mucosal thickening right maxillary sinus. Electronically Signed   By: Lacy Duverney M.D.   On: 01/06/2017 14:28   Mri Brain Without Contrast  Result Date: 01/06/2017 CLINICAL DATA:  Syncope.  Hypertension. EXAM: MRI HEAD WITHOUT CONTRAST TECHNIQUE: Multiplanar, multiecho pulse sequences of the brain and surrounding structures were obtained without intravenous contrast. COMPARISON:  CT head 01/06/2017.  MRI 08/28/2015 FINDINGS: Brain: Negative for acute infarct. Atrophy with chronic microvascular ischemic change throughout the white matter. Chronic infarct in the anterior corpus callosum. Small chronic infarct in the right medial parietal lobe. Multiple areas of chronic microhemorrhage throughout the brain consistent with the history of hypertension. These were present previously. No mass or fluid collection. No shift of the midline structures. Vascular: Normal arterial flow voids. Skull and upper cervical spine: Negative Sinuses/Orbits: Mucosal edema paranasal sinuses. Bilateral mastoid sinus effusion. Negative orbit. Other: None IMPRESSION: No acute intracranial abnormality Atrophy with moderate chronic microvascular ischemia. Multiple areas of chronic microhemorrhage in the brain consistent with the history of hypertension. Electronically Signed   By:  Marlan Palau M.D.   On: 01/06/2017 20:07        Scheduled Meds: . aspirin EC  81 mg Oral Daily  . feeding supplement (ENSURE ENLIVE)  237 mL Oral BID BM  . heparin  5,000 Units Subcutaneous Q8H  . megestrol  400 mg Oral Daily  . potassium chloride  40 mEq Oral TID  . potassium chloride  40 mEq Oral Once  . sodium chloride flush  3 mL Intravenous Q12H   Continuous Infusions: . dextrose 5 % and 0.45 % NaCl with KCl 40 mEq/L 50 mL/hr at 01/06/17 2025     LOS: 0 days    Time spent: 25 min    JESSICA U VANN, DO [facility] Pager [phone]  If 7PM-7AM, please contact night-coverage [email or web address]  Password [email or web address] 01/07/2017, 11:12 AM

## 2017-01-07 NOTE — Progress Notes (Signed)
EEG Completed; Results Pending  

## 2017-01-07 NOTE — Procedures (Signed)
ELECTROENCEPHALOGRAM REPORT  Date of Study: 01/07/2017  Patient's Name: Julie Stein MRN: [id number] Date of Birth: 12-09-1934  Referring Provider: Dr. Emmaline Life  Clinical History: This is a 82 year old woman with an episode of right arm shaking followed by syncope.   Medications: acetaminophen (TYLENOL) tablet 650 mg  aspirin EC tablet 81 mg  bisacodyl (DULCOLAX) EC tablet 5 mg  heparin injection 5,000 Units  hydrALAZINE (APRESOLINE) injection 5-10 mg  megestrol (MEGACE) 400 MG/10ML suspension 400 mg  ondansetron (ZOFRAN) tablet 4 mg  potassium chloride SA (K-DUR,KLOR-CON) CR tablet 40 mEq  senna-docusate (Senokot-S) tablet 1 tablet   Technical Summary: A multichannel digital EEG recording measured by the international 10-20 system with electrodes applied with paste and impedances below 5000 ohms performed as portable with EKG monitoring in an awake and asleep patient.  Hyperventilation was not performed. Photic stimulation was performed.  The digital EEG was referentially recorded, reformatted, and digitally filtered in a variety of bipolar and referential montages for optimal display.   Description: The patient is awake and asleep during the recording.  During maximal wakefulness, there is a symmetric, medium voltage 8 Hz posterior dominant rhythm that poorly attenuates with eye opening and eye closure. This is admixed with a small amount of occasional diffuse 4-5 Hz theta and 2-3 Hz delta slowing of the waking background.  During drowsiness and sleep, there is an increase in theta and delta slowing of the background with poorly formed vertex waves and sleep spindles seen.  Photic stimulation did not elicit any abnormalities.  There were no epileptiform discharges or electrographic seizures seen.    EKG lead was unremarkable.  Impression: This awake and asleep EEG is abnormal due to mild to moderate diffuse slowing of the waking background.  Clinical Correlation of the above  findings indicates diffuse cerebral dysfunction that is non-specific in etiology and can be seen with hypoxic/ischemic injury, toxic/metabolic encephalopathies, neurodegenerative disorders, or medication effect.  The absence of epileptiform discharges does not rule out a clinical diagnosis of epilepsy.  Clinical correlation is advised.   Patrcia Dolly, M.D.

## 2017-01-08 ENCOUNTER — Inpatient Hospital Stay (HOSPITAL_COMMUNITY): Payer: Medicare Other

## 2017-01-08 DIAGNOSIS — R6889 Other general symptoms and signs: Secondary | ICD-10-CM

## 2017-01-08 DIAGNOSIS — D509 Iron deficiency anemia, unspecified: Secondary | ICD-10-CM

## 2017-01-08 DIAGNOSIS — R29898 Other symptoms and signs involving the musculoskeletal system: Secondary | ICD-10-CM

## 2017-01-08 LAB — BASIC METABOLIC PANEL
ANION GAP: 7 (ref 5–15)
BUN: 11 mg/dL (ref 6–20)
CALCIUM: 8.7 mg/dL — AB (ref 8.9–10.3)
CO2: 22 mmol/L (ref 22–32)
Chloride: 117 mmol/L — ABNORMAL HIGH (ref 101–111)
Creatinine, Ser: 0.72 mg/dL (ref 0.44–1.00)
GFR calc Af Amer: >60 mL/min (ref >60)
GFR calc non Af Amer: >60 mL/min (ref >60)
GLUCOSE: 65 mg/dL (ref 65–99)
POTASSIUM: 4.7 mmol/L (ref 3.5–5.1)
Sodium: 146 mmol/L — ABNORMAL HIGH (ref 135–145)

## 2017-01-08 LAB — GLUCOSE, CAPILLARY: Glucose-Capillary: 75 mg/dL (ref 65–99)

## 2017-01-08 MED ORDER — SODIUM CHLORIDE 0.9 % IV SOLN
INTRAVENOUS | Status: DC
  Administered 2017-01-08: 12:00:00 via INTRAVENOUS

## 2017-01-08 MED ORDER — SPIRONOLACTONE 25 MG PO TABS
25.0000 mg | ORAL_TABLET | Freq: Every day | ORAL | Status: DC
  Administered 2017-01-08 – 2017-01-09 (×2): 25 mg via ORAL
  Filled 2017-01-08 (×2): qty 1

## 2017-01-08 NOTE — Progress Notes (Signed)
No charge note.  I spoke with Dr. Benjamine Mola.  Consult cancelled for now.   Please call the Palliative Medicine office on [phone] if we can be of assistance.  Algis Downs, [place] [facility] Pager: [phone]

## 2017-01-08 NOTE — Progress Notes (Signed)
[facility]  Patient Status: Active (receiving services up to time of hospitalization)  [facility] is providing the following services: RN  If patient discharges after hours, please call [phone].   Julie Stein 01/08/2017, 10:10 AM

## 2017-01-08 NOTE — Consult Note (Signed)
NEURO HOSPITALIST CONSULT NOTE   Requestig physician: Dr. Benjamine Mola   Reason for Consult: Possible weakness in legs, resistance to walk, pressure and   History obtained from:  Patient   family  HPI:                                                                                                                                          Julie Stein is an 82 y.o. female who was admitted to hospital after syncopal event. Apparently she had an appointment with [facility] neurology associates today but is missing that. For that reason neurology was consult to do further evaluate patient's progressive decline in ability to walk. In discussing with her daughters, about mid October patient had a fairly progressively sudden onset of decreased ability to walk, increased abulia, increased depression, decreased desire to do anything that she took pleasure and before. Patient does not drive, her bills are taken care of by her family, and recently it has been noted that her memory has not been as well as recently. 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 In discussion with the actual patient, the patient would not answer me clearly, with circumvent answers, and at times stated " I don't want to walk because if I walk and that means I have to work". This was further investigated patient would then circumvent the answer and make an gentle answers that made absolutely no sense. She then would state that she was upset with her family. When directly asked if she was upset or depressed with something she said yes, but when further questions were asked patient would not answer.  Daughter state there are times on patient is able to walk with a walker and other times when they get her up with a walker and she just lets her legs go limp like a noodle. She never lets herself fall  however.  Past Medical History:  Diagnosis Date  . Anemia    due to stage 4 chronic kidney disease treated with darbepoetin Hattie Perch 01/06/2017  . CAD (coronary artery disease)   . Cardiomyopathy (HCC)   . CHF (congestive heart failure) (HCC)   . Chronic kidney disease (CKD), stage IV (severe) (HCC)    Hattie Perch 01/06/2017  . Hypertension   . Hypochromic microcytic anemia    Hattie Perch 01/06/2017  . Moderate protein-calorie malnutrition (HCC)    Hattie Perch 01/06/2017  . Physical deconditioning    Hattie Perch 01/06/2017  . Stroke (HCC)   . SVT (supraventricular tachycardia) (HCC)     Past Surgical History:  Procedure Laterality Date  . CARDIAC CATHETERIZATION N/A 08/27/2015   Procedure: Right/Left  Heart Cath and Coronary Angiography;  Surgeon: Laurey Morale, MD;  [facility] INVASIVE CV LAB;  Service: Cardiovascular;  Laterality: N/A;  . ELECTROPHYSIOLOGIC STUDY N/A 09/05/2015   Procedure: SVT Ablation;  Surgeon: Will Jorja Loa, MD;  Location: [facility] INVASIVE CV LAB;  Service: Cardiovascular;  Laterality: N/A;  . KNEE SURGERY      Family History  Problem Relation Age of Onset  . Hypertension Daughter      Social History:  reports that she has never smoked. She has never used smokeless tobacco. She reports that she does not drink alcohol or use drugs.  Allergies  Allergen Reactions  . Demerol [Meperidine] Other (See Comments)    Hallucinations, out of body experience    MEDICATIONS:                                                                                                                     Prior to Admission:  Prescriptions Prior to Admission  Medication Sig Dispense Refill Last Dose  . aspirin EC 81 MG EC tablet Take 1 tablet (81 mg total) by mouth daily. 30 tablet 0 01/06/2017 at Unknown time  . carvedilol (COREG) 12.5 MG tablet Take 1 tablet (12.5 mg total) by mouth 2 (two) times daily with a meal. 60 tablet 6 01/06/2017 at 1100  . hydrALAZINE (APRESOLINE) 25 MG tablet Take 0.5  tablets (12.5 mg total) by mouth 3 (three) times daily. 45 tablet 6 01/06/2017 at am  . isosorbide dinitrate (ISORDIL) 20 MG tablet Take 1 tablet (20 mg total) by mouth 3 (three) times daily. 60 tablet 6 01/06/2017 at am  . megestrol (MEGACE) 400 MG/10ML suspension Take 10 mLs (400 mg total) by mouth daily. (Patient taking differently: Take 200-400 mg by mouth daily. ) 240 mL 0 01/05/2017 at Unknown time  . nitroGLYCERIN (NITROSTAT) 0.4 MG SL tablet Place 1 tablet (0.4 mg total) under the tongue every 5 (five) minutes as needed for chest pain. 30 tablet 12 prn  . spironolactone (ALDACTONE) 25 MG tablet Take 1 tablet (25 mg total) by mouth daily. 90 tablet 3 01/06/2017 at Unknown time   Scheduled: . aspirin EC  81 mg Oral Daily  . carvedilol  6.25 mg Oral BID WC  . feeding supplement  1 Container Oral BID WC  . feeding supplement (ENSURE ENLIVE)  237 mL Oral BID BM  . heparin  5,000 Units Subcutaneous Q8H  . megestrol  400 mg Oral Daily  . multivitamin  15 mL Oral Daily  . nutrition supplement (JUVEN)  1 packet Oral BID PC  . potassium chloride  40 mEq Oral Once  . potassium chloride  40 mEq Oral TID  . sodium chloride flush  3 mL Intravenous Q12H  . spironolactone  25 mg Oral Daily     ROS:  History obtained from the patient and Family  General ROS: negative for - chills, fatigue, fever, night sweats, weight gain or weight loss Psychological ROS: negative for - behavioral disorder, hallucinations, memory difficulties, mood swings or suicidal ideation Ophthalmic ROS: negative for - blurry vision, double vision, eye pain or loss of vision ENT ROS: negative for - epistaxis, nasal discharge, oral lesions, sore throat, tinnitus or vertigo Allergy and Immunology ROS: negative for - hives or itchy/watery eyes Hematological and Lymphatic ROS: negative for - bleeding  problems, bruising or swollen lymph nodes Endocrine ROS: negative for - galactorrhea, hair pattern changes, polydipsia/polyuria or temperature intolerance Respiratory ROS: negative for - cough, hemoptysis, shortness of breath or wheezing Cardiovascular ROS: negative for - chest pain, dyspnea on exertion, edema or irregular heartbeat Gastrointestinal ROS: negative for - abdominal pain, diarrhea, hematemesis, nausea/vomiting or stool incontinence Genito-Urinary ROS: negative for - dysuria, hematuria, incontinence or urinary frequency/urgency Musculoskeletal ROS: negative for - joint swelling or muscular weakness Neurological ROS: as noted in HPI Dermatological ROS: negative for rash and skin lesion changes   Blood pressure (!) 160/87, pulse 76, temperature 98.2 F (36.8 C), temperature source Oral, resp. rate 18, height 4\' 11"  (1.499 m), weight 36.7 kg (81 lb), SpO2 100 %.   Neurologic Examination:                                                                                                      HEENT-  Normocephalic, no lesions, without obvious abnormality.  Normal external eye and conjunctiva.  Normal TM's bilaterally.  Normal auditory canals and external ears. Normal external nose, mucus membranes and septum.  Normal pharynx. Cardiovascular- S1, S2 normal, pulses palpable throughout   Lungs- chest clear, no wheezing, rales, normal symmetric air entry Abdomen- normal findings: bowel sounds normal Extremities- no edema Lymph-no adenopathy palpable Musculoskeletal-no joint tenderness, deformity or swelling Skin-warm and dry, no hyperpigmentation, vitiligo, or suspicious lesions  Neurological Examination Mental Status: Alert, oriented, thought content appropriate.  Speech fluent but minimal with no aphasia.  Able to follow 3 step commands but this was clearly only if she wanted to actually follow the commands. Patient was able to do what I asked her to do but at times was defiant and did  not want to.. Cranial Nerves: II:  Visual fields grossly normal, pupils equal, round, reactive to light and accommodation III,IV, VI: ptosis not present, extra-ocular motions intact bilaterally V,VII: smile symmetric, facial light touch sensation normal bilaterally VIII: hearing normal bilaterally IX,X: uvula rises symmetrically XI: bilateral shoulder shrug XII: midline tongue extension Motor: Right : Upper extremity   5/5    Left:     Upper extremity   5/5  Lower extremity   5/5     Lower extremity   5/5 Tone and bulk:normal tone throughout; no atrophy noted Sensory: Pinprick and light touch intact throughout, bilaterally Deep Tendon Reflexes: 1+ and symmetric throughout lower extremities with 2+ in the upper extremities Plantars: Right: downgoing   Left: downgoing Cerebellar: normal finger-to-nose,  and normal heel-to-shin test Gait: Not tested   Much of the  above exam was limited secondary to patient not wanting to actually take part in the exam although she knew exactly what I was asking her to do. Daughter states this is what is going on at home also.   Lab Results: Basic Metabolic Panel:  Recent Labs Lab 01/02/17 1203 01/06/17 1309 01/07/17 0622 01/07/17 1121 01/08/17 0804  NA 147* 147* 147*  --  146*  K 2.8* 2.9* 2.8*  --  4.7  CL 109 110 112*  --  117*  CO2 31 28 24   --  22  GLUCOSE 86 81 82  --  65  BUN 14 17 13   --  11  CREATININE 0.79 0.76 0.78  --  0.72  CALCIUM 8.8* 8.4* 8.6*  --  8.7*  MG  --   --   --  2.4  --     Liver Function Tests:  Recent Labs Lab 01/02/17 1203 01/07/17 0622  AST 18 20  ALT 10* 10*  ALKPHOS 53 52  BILITOT 0.7 0.6  PROT 5.7* 5.1*  ALBUMIN 2.2* 2.1*   No results for input(s): LIPASE, AMYLASE in the last 168 hours.  Recent Labs Lab 01/07/17 1405  AMMONIA 18    CBC:  Recent Labs Lab 01/06/17 1309 01/07/17 0622  WBC 4.4 6.2  HGB 9.3* 10.0*  HCT 29.0* 30.1*  MCV 80.8 79.6  PLT 270 286    Cardiac Enzymes: No  results for input(s): CKTOTAL, CKMB, CKMBINDEX, TROPONINI in the last 168 hours.  Lipid Panel: No results for input(s): CHOL, TRIG, HDL, CHOLHDL, VLDL, LDLCALC in the last 168 hours.  CBG:  Recent Labs Lab 01/06/17 1259 01/07/17 0553 01/08/17 0630  GLUCAP 70 90 75    Microbiology: Results for orders placed or performed during the hospital encounter of 01/06/17  Urine culture     Status: None   Collection Time: 01/06/17  2:48 PM  Result Value Ref Range Status   Specimen Description URINE, RANDOM  Final   Special Requests NONE  Final   Culture NO GROWTH  Final   Report Status 01/07/2017 FINAL  Final    Coagulation Studies: No results for input(s): LABPROT, INR in the last 72 hours.  Imaging: Dg Chest 2 View  Result Date: 01/06/2017 CLINICAL DATA:  82 year old female without chest pain. Confusion. Initial encounter. EXAM: CHEST  2 VIEW COMPARISON:  08/31/2015. FINDINGS: No infiltrate, congestive heart failure or pneumothorax. Blunting posterior sulci suggestive of small pleural effusions/pleural thickening. No plain film evidence of pulmonary malignancy. Scoliosis with distortion of the mediastinal and cardiac silhouette. Heart size within normal limits. Calcified tortuous aorta. Bones are osteopenic. IMPRESSION: Blunting posterior sulci may represent presence of small pleural effusions or pleural thickening. No segmental infiltrate or congestive heart failure. Aortic atherosclerosis. Electronically Signed   By: Lacy Duverney M.D.   On: 01/06/2017 14:12   Ct Head Wo Contrast  Result Date: 01/06/2017 CLINICAL DATA:  82 year old female syncope with fall and confusion. Initial encounter. EXAM: CT HEAD WITHOUT CONTRAST TECHNIQUE: Contiguous axial images were obtained from the base of the skull through the vertex without intravenous contrast. COMPARISON:  08/28/2015 MR and 08/27/2015 CT. FINDINGS: Brain: No intracranial hemorrhage. Chronic microvascular changes without CT evidence of  large acute infarct. Mild global atrophy without hydrocephalus. No intracranial mass lesion noted on this unenhanced exam. Vascular: Vascular calcifications. Skull: No skull fracture. Sinuses/Orbits: No acute orbital abnormality. Partial opacification right mastoid air cells without obstructing lesion of eustachian tube noted. Minimal mucosal thickening right maxillary sinus.  Other: Negative IMPRESSION: No skull fracture or intracranial hemorrhage. Chronic microvascular changes without CT evidence of large acute infarct. Partial opacification right mastoid air cells. Minimal mucosal thickening right maxillary sinus. Electronically Signed   By: Lacy Duverney M.D.   On: 01/06/2017 14:28   Mri Brain Without Contrast  Result Date: 01/06/2017 CLINICAL DATA:  Syncope.  Hypertension. EXAM: MRI HEAD WITHOUT CONTRAST TECHNIQUE: Multiplanar, multiecho pulse sequences of the brain and surrounding structures were obtained without intravenous contrast. COMPARISON:  CT head 01/06/2017.  MRI 08/28/2015 FINDINGS: Brain: Negative for acute infarct. Atrophy with chronic microvascular ischemic change throughout the white matter. Chronic infarct in the anterior corpus callosum. Small chronic infarct in the right medial parietal lobe. Multiple areas of chronic microhemorrhage throughout the brain consistent with the history of hypertension. These were present previously. No mass or fluid collection. No shift of the midline structures. Vascular: Normal arterial flow voids. Skull and upper cervical spine: Negative Sinuses/Orbits: Mucosal edema paranasal sinuses. Bilateral mastoid sinus effusion. Negative orbit. Other: None IMPRESSION: No acute intracranial abnormality Atrophy with moderate chronic microvascular ischemia. Multiple areas of chronic microhemorrhage in the brain consistent with the history of hypertension. Electronically Signed   By: Marlan Palau M.D.   On: 01/06/2017 20:07       Assessment and plan per attending  neurologist  Felicie Morn PA-C [facility] [phone]  01/08/2017, 12:31 PM   Assessment/Plan: 82 year old female with a fairly sudden onset progression of lower extremity weakness that fluctuates. I have a high suspicion that this may be functional secondary to depression however would still need to further evaluate to rule out any type of organic.  This reason we will get an MRI of the thoracic cervical and lumbar spine. She will need physical therapy. She will need follow-up with her outpatient neurologist as she had planned. If all tests are negative most likely will need to be followed up by psychology for coping skills and depression.

## 2017-01-08 NOTE — Progress Notes (Signed)
Nutrition Follow-up  DOCUMENTATION CODES:   Non-severe (moderate) malnutrition in context of chronic illness, Underweight  INTERVENTION:  Boost Breeze po BID with meals, each supplement provides 250 kcal and 9 grams of protein Magic cup once daily with dinner, supplement provides 290 kcal and 9 grams of protein Juven BID and liquid multivitamin daily to promote wound healing Ensure Enlive BID, each supplement provides 350 kcal and 20 grams of protein   NUTRITION DIAGNOSIS:   Malnutrition related to poor appetite, lethargy/confusion as evidenced by energy intake < or equal to 75% for > or equal to 1 month, moderate depletion of body fat, moderate depletions of muscle mass.  ongoing  GOAL:   Patient will meet greater than or equal to 90% of their needs  unmet  MONITOR:   PO intake, Supplement acceptance, Skin, Weight trends, Labs, I & O's  REASON FOR ASSESSMENT:   Malnutrition Screening Tool    ASSESSMENT:   82 y.o. female with dementia, systolic heart failure, cardiomyopathy, CAD, hypertension, Stroke in 2016,  SVT, questionable brought by [facility] after having been found unresponsive by daughter after a syncopal episode lasting  about 15 minutes "she was breathing about 2 times a minute", then improved after nasal airway and bag were placed.   RN reports that patient is not consuming supplements; seems to like Boost Breeze much better than Ensure. RD met with pt's daughter at bedside who was encouraging pt to drink Juven. Daughter states she would like to keep all supplements in place so that they are at least available to pt; pt has not yet tried Oceanographer Cup ice cream. Daughter reports that patient hasn't been eating much; she plans to bring pt some food from home.  Pt's weight is up 3 lbs from admission weight.   Labs: low calcium, low hemoglobin  Diet Order:  DIET DYS 3 Room service appropriate? Yes; Fluid consistency: Thin  Skin:  Wound (see comment) (Stage II PI on  buttocks)  Last BM:  2/21  Height:   Ht Readings from Last 1 Encounters:  01/06/17 '4\' 11"'$  (1.499 m)    Weight:   Wt Readings from Last 1 Encounters:  01/08/17 81 lb (36.7 kg)    Ideal Body Weight:  44.7 kg  BMI:  Body mass index is 16.36 kg/m.  Estimated Nutritional Needs:   Kcal:  1300-1500  Protein:  50-60 grams  Fluid:  1.3-1.5 L/day  EDUCATION NEEDS:   No education needs identified at this time  Scarlette Ar RD, LDN, CSP Inpatient Clinical Dietitian Pager: [phone] After Hours Pager: [phone]

## 2017-01-08 NOTE — Progress Notes (Signed)
Patient ID: Julie Stein, female   DOB: 04-05-1935, 82 y.o.   MRN: [id number]   SUBJECTIVE: No complaints this morning, wants to go home.  No events on telemetry overnight.   Echo: EF 60-65%, mild LVH.    Vitals:   01/07/17 1740 01/07/17 2001 01/08/17 0035 01/08/17 0525  BP: (!) 141/80 135/81 (!) 141/81 (!) 160/87  Pulse: 93 88 79 76  Resp: '18 18 18 18  '$ Temp: 99.3 F (37.4 C) 99.4 F (37.4 C) 98.9 F (37.2 C) 98.2 F (36.8 C)  TempSrc: Oral Oral Oral Oral  SpO2: 98% 100% 100% 100%  Weight:    81 lb (36.7 kg)  Height:        Intake/Output Summary (Last 24 hours) at 01/08/17 0820 Last data filed at 01/07/17 2335  Gross per 24 hour  Intake             1763 ml  Output                0 ml  Net             1763 ml    LABS: Basic Metabolic Panel:  Recent Labs  01/06/17 1309 01/07/17 0622 01/07/17 1121  NA 147* 147*  --   K 2.9* 2.8*  --   CL 110 112*  --   CO2 28 24  --   GLUCOSE 81 82  --   BUN 17 13  --   CREATININE 0.76 0.78  --   CALCIUM 8.4* 8.6*  --   MG  --   --  2.4   Liver Function Tests:  Recent Labs  01/07/17 0622  AST 20  ALT 10*  ALKPHOS 52  BILITOT 0.6  PROT 5.1*  ALBUMIN 2.1*   No results for input(s): LIPASE, AMYLASE in the last 72 hours. CBC:  Recent Labs  01/06/17 1309 01/07/17 0622  WBC 4.4 6.2  HGB 9.3* 10.0*  HCT 29.0* 30.1*  MCV 80.8 79.6  PLT 270 286   Cardiac Enzymes: No results for input(s): CKTOTAL, CKMB, CKMBINDEX, TROPONINI in the last 72 hours. BNP: Invalid input(s): POCBNP D-Dimer: No results for input(s): DDIMER in the last 72 hours. Hemoglobin A1C: No results for input(s): HGBA1C in the last 72 hours. Fasting Lipid Panel: No results for input(s): CHOL, HDL, LDLCALC, TRIG, CHOLHDL, LDLDIRECT in the last 72 hours. Thyroid Function Tests: No results for input(s): TSH, T4TOTAL, T3FREE, THYROIDAB in the last 72 hours.  Invalid input(s): FREET3 Anemia Panel: No results for input(s): VITAMINB12, FOLATE, FERRITIN,  TIBC, IRON, RETICCTPCT in the last 72 hours.  RADIOLOGY: Dg Chest 2 View  Result Date: 01/06/2017 CLINICAL DATA:  82 year old female without chest pain. Confusion. Initial encounter. EXAM: CHEST  2 VIEW COMPARISON:  08/31/2015. FINDINGS: No infiltrate, congestive heart failure or pneumothorax. Blunting posterior sulci suggestive of small pleural effusions/pleural thickening. No plain film evidence of pulmonary malignancy. Scoliosis with distortion of the mediastinal and cardiac silhouette. Heart size within normal limits. Calcified tortuous aorta. Bones are osteopenic. IMPRESSION: Blunting posterior sulci may represent presence of small pleural effusions or pleural thickening. No segmental infiltrate or congestive heart failure. Aortic atherosclerosis. Electronically Signed   By: Genia Del M.D.   On: 01/06/2017 14:12   Ct Head Wo Contrast  Result Date: 01/06/2017 CLINICAL DATA:  82 year old female syncope with fall and confusion. Initial encounter. EXAM: CT HEAD WITHOUT CONTRAST TECHNIQUE: Contiguous axial images were obtained from the base of the skull through the vertex without intravenous contrast.  COMPARISON:  08/28/2015 MR and 08/27/2015 CT. FINDINGS: Brain: No intracranial hemorrhage. Chronic microvascular changes without CT evidence of large acute infarct. Mild global atrophy without hydrocephalus. No intracranial mass lesion noted on this unenhanced exam. Vascular: Vascular calcifications. Skull: No skull fracture. Sinuses/Orbits: No acute orbital abnormality. Partial opacification right mastoid air cells without obstructing lesion of eustachian tube noted. Minimal mucosal thickening right maxillary sinus. Other: Negative IMPRESSION: No skull fracture or intracranial hemorrhage. Chronic microvascular changes without CT evidence of large acute infarct. Partial opacification right mastoid air cells. Minimal mucosal thickening right maxillary sinus. Electronically Signed   By: Genia Del M.D.    On: 01/06/2017 14:28   Mri Brain Without Contrast  Result Date: 01/06/2017 CLINICAL DATA:  Syncope.  Hypertension. EXAM: MRI HEAD WITHOUT CONTRAST TECHNIQUE: Multiplanar, multiecho pulse sequences of the brain and surrounding structures were obtained without intravenous contrast. COMPARISON:  CT head 01/06/2017.  MRI 08/28/2015 FINDINGS: Brain: Negative for acute infarct. Atrophy with chronic microvascular ischemic change throughout the white matter. Chronic infarct in the anterior corpus callosum. Small chronic infarct in the right medial parietal lobe. Multiple areas of chronic microhemorrhage throughout the brain consistent with the history of hypertension. These were present previously. No mass or fluid collection. No shift of the midline structures. Vascular: Normal arterial flow voids. Skull and upper cervical spine: Negative Sinuses/Orbits: Mucosal edema paranasal sinuses. Bilateral mastoid sinus effusion. Negative orbit. Other: None IMPRESSION: No acute intracranial abnormality Atrophy with moderate chronic microvascular ischemia. Multiple areas of chronic microhemorrhage in the brain consistent with the history of hypertension. Electronically Signed   By: Franchot Gallo M.D.   On: 01/06/2017 20:07    PHYSICAL EXAM General: NAD Neck: No JVD, no thyromegaly or thyroid nodule.  Lungs: Clear to auscultation bilaterally with normal respiratory effort. CV: Nondisplaced PMI.  Heart regular S1/S2, +S4, no murmur.  No peripheral edema.  No carotid bruit.  Normal pedal pulses.  Abdomen: Soft, nontender, no hepatosplenomegaly, no distention.  Neurologic: Alert and oriented x 3.  Psych: Normal affect. Extremities: No clubbing or cyanosis.   TELEMETRY: Reviewed telemetry pt in NSR, no events overnight  ASSESSMENT AND PLAN: 1. Syncope: Cause uncertain.  ?Due to meds/orthostasis/poor po intake though meds recently cut back.  She has a history of SVT (possible AVNRT), cannot rule this out as a cause.   Echo unchanged with normal EF.  No arrhythmias overnight on telemetry.  Possible that hypokalemia could have triggered an arrhythmic event, however, prior to admission.  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  1 short (about 10 beats) run noted since hospitalization, was off Coreg at the time.  Restarted Coreg 6.25 mg bid.  3. Chronic systolic CHF: Mixed ischemic/nonischemic cardiomyopathy, HTN likely played a role.  Echo 2/21 shows that EF remains improved, 60-65%.  4. CAD: Stopped statin recently given worsened mental status (confusion, dementia) to see if this helps any.  Continue ASA 81 daily.  5. CVA: No new stroke on imaging.  6. Weight loss, fatigue, confusion, anemia, poor appetite: No localizing symptoms to suggest cancer. ESR was normal, NH3 was normal. Has been followed by hematology, have thought anemia of renal disease with elevated creatinine relative to her size. She is going to be seeing a nephrologist. Given confusion/memory difficulty, she will be seeing a neurologist. We do not have a unifying diagnosis for her condition at this time.   7. Hypokalemia: Replace K.  ?If this could have triggered an arrhythmia. Will restart home spironolactone 25 mg daily.  8. HTN: BP now  on the high side.  Would continue Coreg 6.25 mg bid and restart home spironolactone 25 daily with hypokalemia.  Would check orthostatics.   May be able to go home soon.    Loralie Champagne 01/08/2017 8:25 AM

## 2017-01-08 NOTE — Progress Notes (Addendum)
PROGRESS NOTE    Julie Stein  WGN:[id number] DOB: 1934/12/20 DOA: 01/06/2017 PCP: Concepcion Living, NP   Outpatient Specialists:     Brief Narrative:  Julie Stein is a 82 y.o. female with extensive cardiac history listed below including systolic heart failure, cardiomyopathy, CAD, hypertension, Stroke in 2016,  SVT, questionable brought by [facility] after having been found unresponsive by daughter after a syncopal episode lasting  about 15 minutes "she was breathing about 2 times a minute", then improved after nasal airway and bag were placed. History is obtained by her daughter, as the patient has dementia, unable to provide. No apaprentt infections, or sick contacts. The patient has been in a steady clinical decline over the last 2 to 3 months. She has not been able to eat, or drink as much as before, in fact, her cardiologist recently placed her on Megace to improve her appetite. No nausea or vomiting. No diarrhea. She has a history of anemia, and has been taking Epo injections at the cancer center. Of note, the patient has not been complaining of chest pain or palpitations. She has a history of vasovagal syndrome in Oct of 2017,.No respiratory complaints. No lower extremity swelling. No apparent unilateral weakness or decrease in sensation or headaches or blurred vision. She may have had left facial droop.  No fever chills or night sweats.She walks with a walker.   Assessment & Plan:   Active Problems:   Essential hypertension   Acute systolic CHF (congestive heart failure), NYHA class 4 (HCC)   Paroxysmal SVT (supraventricular tachycardia) (HCC)   Syncope   Chronic systolic heart failure (HCC)   Microcytic hypochromic anemia   H/O: CVA (cerebrovascular accident)   Anemia due to stage 4 chronic kidney disease treated with darbepoetin (HCC)   Protein-calorie malnutrition, moderate (HCC)   Physical deconditioning   Confusion   Altered mental status   Hypokalemia due to inadequate potassium  intake   Hypotension   Stroke-like episode (HCC)   Pressure injury of skin   Syncope, likely multifactorial given strong cardiac history, in addition to poor oral intake, dehydration and +-med induced. versus CVA versus seizure . EKG unrevealing. Unlikely seizure related, does not appear postictal . CT head negative for acute intracranial abnormalities. Afebrile.  -2 D echo: Normal LV size with mild LV hypertrophy. EF 60-65%. Normal RV   size and systolic function. No significant valvular   abnormalities IV fluids orthostatics appear not to have been done as patient not ambulatory since October  MRI head negative for acute issues EEG diffuse slowing  Chronic  Systolic CHF  CXR without CHF Last 2 D echo in 1/17 with EF 60-65. Weight 35.4 kg  Cards consult apprecaited- coreg and aldactone restarted monitor I/Os and daily weights prn 02  History of CVA  MRI negative for new CVA  Hypertension  -cards to adjust meds  Chronic kidney disease stage  3   BMP daily Estimated Creatinine Clearance: 33 mL/min (by C-G formula based on SCr of 0.72 mg/dL).  Hypokalemia -replaced  Hypernatremia  -resolved with IVF -encourage PO water intake  Anemia of chronic disease Patient on Epo as OP, due for next does in March at the [facility]  Weight loss, anorexia, deconditioning.  Wt 35.4 kg- protein calorie malnutrition Continue Megace  palliative care consult suggested by CHF team-- will defer to PCP  Dementia Per family patient normal in October (walking) and now with rapid decline over 2 weeks (not able to walk) and steady decline since then -  was scheduled as an outpatient but had a syncopal event and was admitted to the hospital B12 normal -appreciate neuro consult here to expedite work up as outpatinet   DVT prophylaxis:  SQ Heparin  Code Status: Full Code   Family Communication: daughter  Disposition Plan:     Consultants:    Cards  neuro    Subjective: No complaints  Objective: Vitals:   01/07/17 1740 01/07/17 2001 01/08/17 0035 01/08/17 0525  BP: (!) 141/80 135/81 (!) 141/81 (!) 160/87  Pulse: 93 88 79 76  Resp: 18 18 18 18   Temp: 99.3 F (37.4 C) 99.4 F (37.4 C) 98.9 F (37.2 C) 98.2 F (36.8 C)  TempSrc: Oral Oral Oral Oral  SpO2: 98% 100% 100% 100%  Weight:    36.7 kg (81 lb)  Height:        Intake/Output Summary (Last 24 hours) at 01/08/17 1216 Last data filed at 01/07/17 2335  Gross per 24 hour  Intake             1643 ml  Output                0 ml  Net             1643 ml   Filed Weights   01/06/17 1809 01/07/17 0543 01/08/17 0525  Weight: 34.2 kg (75 lb 8 oz) 36.1 kg (79 lb 8 oz) 36.7 kg (81 lb)    Examination:  General exam: Appears calm and comfortable  Speech low and difficult to understand at times rrr Clear No edema Weak in all limbs    Data Reviewed: I have personally reviewed following labs and imaging studies  CBC:  Recent Labs Lab 01/06/17 1309 01/07/17 0622  WBC 4.4 6.2  HGB 9.3* 10.0*  HCT 29.0* 30.1*  MCV 80.8 79.6  PLT 270 286   Basic Metabolic Panel:  Recent Labs Lab 01/02/17 1203 01/06/17 1309 01/07/17 0622 01/07/17 1121 01/08/17 0804  NA 147* 147* 147*  --  146*  K 2.8* 2.9* 2.8*  --  4.7  CL 109 110 112*  --  117*  CO2 31 28 24   --  22  GLUCOSE 86 81 82  --  65  BUN 14 17 13   --  11  CREATININE 0.79 0.76 0.78  --  0.72  CALCIUM 8.8* 8.4* 8.6*  --  8.7*  MG  --   --   --  2.4  --    GFR: Estimated Creatinine Clearance: 33 mL/min (by C-G formula based on SCr of 0.72 mg/dL). Liver Function Tests:  Recent Labs Lab 01/02/17 1203 01/07/17 0622  AST 18 20  ALT 10* 10*  ALKPHOS 53 52  BILITOT 0.7 0.6  PROT 5.7* 5.1*  ALBUMIN 2.2* 2.1*   No results for input(s): LIPASE, AMYLASE in the last 168 hours.  Recent Labs Lab 01/07/17 1405  AMMONIA 18   Coagulation Profile: No results for input(s): INR, PROTIME in the  last 168 hours. Cardiac Enzymes: No results for input(s): CKTOTAL, CKMB, CKMBINDEX, TROPONINI in the last 168 hours. BNP (last 3 results) No results for input(s): PROBNP in the last 8760 hours. HbA1C: No results for input(s): HGBA1C in the last 72 hours. CBG:  Recent Labs Lab 01/06/17 1259 01/07/17 0553 01/08/17 0630  GLUCAP 70 90 75   Lipid Profile: No results for input(s): CHOL, HDL, LDLCALC, TRIG, CHOLHDL, LDLDIRECT in the last 72 hours. Thyroid Function Tests: No results for input(s): TSH, T4TOTAL, FREET4,  T3FREE, THYROIDAB in the last 72 hours. Anemia Panel: No results for input(s): VITAMINB12, FOLATE, FERRITIN, TIBC, IRON, RETICCTPCT in the last 72 hours. Urine analysis:    Component Value Date/Time   COLORURINE YELLOW 01/06/2017 1448   APPEARANCEUR HAZY (A) 01/06/2017 1448   LABSPEC 1.021 01/06/2017 1448   LABSPEC 1.010 11/26/2016 1301   PHURINE 6.0 01/06/2017 1448   GLUCOSEU NEGATIVE 01/06/2017 1448   GLUCOSEU Negative 11/26/2016 1301   HGBUR NEGATIVE 01/06/2017 1448   BILIRUBINUR NEGATIVE 01/06/2017 1448   BILIRUBINUR Negative 11/26/2016 1301   KETONESUR NEGATIVE 01/06/2017 1448   PROTEINUR 30 (A) 01/06/2017 1448   UROBILINOGEN 0.2 11/26/2016 1301   NITRITE NEGATIVE 01/06/2017 1448   LEUKOCYTESUR NEGATIVE 01/06/2017 1448   LEUKOCYTESUR Trace 11/26/2016 1301     ) Recent Results (from the past 240 hour(s))  Urine culture     Status: None   Collection Time: 01/06/17  2:48 PM  Result Value Ref Range Status   Specimen Description URINE, RANDOM  Final   Special Requests NONE  Final   Culture NO GROWTH  Final   Report Status 01/07/2017 FINAL  Final      Anti-infectives    None       Radiology Studies: Dg Chest 2 View  Result Date: 01/06/2017 CLINICAL DATA:  82 year old female without chest pain. Confusion. Initial encounter. EXAM: CHEST  2 VIEW COMPARISON:  08/31/2015. FINDINGS: No infiltrate, congestive heart failure or pneumothorax. Blunting  posterior sulci suggestive of small pleural effusions/pleural thickening. No plain film evidence of pulmonary malignancy. Scoliosis with distortion of the mediastinal and cardiac silhouette. Heart size within normal limits. Calcified tortuous aorta. Bones are osteopenic. IMPRESSION: Blunting posterior sulci may represent presence of small pleural effusions or pleural thickening. No segmental infiltrate or congestive heart failure. Aortic atherosclerosis. Electronically Signed   By: Lacy Duverney M.D.   On: 01/06/2017 14:12   Ct Head Wo Contrast  Result Date: 01/06/2017 CLINICAL DATA:  82 year old female syncope with fall and confusion. Initial encounter. EXAM: CT HEAD WITHOUT CONTRAST TECHNIQUE: Contiguous axial images were obtained from the base of the skull through the vertex without intravenous contrast. COMPARISON:  08/28/2015 MR and 08/27/2015 CT. FINDINGS: Brain: No intracranial hemorrhage. Chronic microvascular changes without CT evidence of large acute infarct. Mild global atrophy without hydrocephalus. No intracranial mass lesion noted on this unenhanced exam. Vascular: Vascular calcifications. Skull: No skull fracture. Sinuses/Orbits: No acute orbital abnormality. Partial opacification right mastoid air cells without obstructing lesion of eustachian tube noted. Minimal mucosal thickening right maxillary sinus. Other: Negative IMPRESSION: No skull fracture or intracranial hemorrhage. Chronic microvascular changes without CT evidence of large acute infarct. Partial opacification right mastoid air cells. Minimal mucosal thickening right maxillary sinus. Electronically Signed   By: Lacy Duverney M.D.   On: 01/06/2017 14:28   Mri Brain Without Contrast  Result Date: 01/06/2017 CLINICAL DATA:  Syncope.  Hypertension. EXAM: MRI HEAD WITHOUT CONTRAST TECHNIQUE: Multiplanar, multiecho pulse sequences of the brain and surrounding structures were obtained without intravenous contrast. COMPARISON:  CT head  01/06/2017.  MRI 08/28/2015 FINDINGS: Brain: Negative for acute infarct. Atrophy with chronic microvascular ischemic change throughout the white matter. Chronic infarct in the anterior corpus callosum. Small chronic infarct in the right medial parietal lobe. Multiple areas of chronic microhemorrhage throughout the brain consistent with the history of hypertension. These were present previously. No mass or fluid collection. No shift of the midline structures. Vascular: Normal arterial flow voids. Skull and upper cervical spine: Negative Sinuses/Orbits: Mucosal edema  paranasal sinuses. Bilateral mastoid sinus effusion. Negative orbit. Other: None IMPRESSION: No acute intracranial abnormality Atrophy with moderate chronic microvascular ischemia. Multiple areas of chronic microhemorrhage in the brain consistent with the history of hypertension. Electronically Signed   By: Marlan Palau M.D.   On: 01/06/2017 20:07        Scheduled Meds: . aspirin EC  81 mg Oral Daily  . carvedilol  6.25 mg Oral BID WC  . feeding supplement  1 Container Oral BID WC  . feeding supplement (ENSURE ENLIVE)  237 mL Oral BID BM  . heparin  5,000 Units Subcutaneous Q8H  . megestrol  400 mg Oral Daily  . multivitamin  15 mL Oral Daily  . nutrition supplement (JUVEN)  1 packet Oral BID PC  . potassium chloride  40 mEq Oral Once  . potassium chloride  40 mEq Oral TID  . sodium chloride flush  3 mL Intravenous Q12H  . spironolactone  25 mg Oral Daily   Continuous Infusions: . sodium chloride 50 mL/hr at 01/08/17 1130     LOS: 1 day    Time spent: 25 min    Shadara Lopez U Kacper Cartlidge, DO [facility] Pager [phone]  If 7PM-7AM, please contact night-coverage [email or web address] Password [facility] 01/08/2017, 12:16 PM

## 2017-01-08 NOTE — Progress Notes (Signed)
The nurse tech noticed the patient pocketing food while eating, may be a concern for her and her family. Questioning if she will need mechanical soft diet.

## 2017-01-09 DIAGNOSIS — I10 Essential (primary) hypertension: Secondary | ICD-10-CM

## 2017-01-09 DIAGNOSIS — R4182 Altered mental status, unspecified: Secondary | ICD-10-CM

## 2017-01-09 LAB — GLUCOSE, CAPILLARY: Glucose-Capillary: 75 mg/dL (ref 65–99)

## 2017-01-09 LAB — BASIC METABOLIC PANEL
Anion gap: 4 — ABNORMAL LOW (ref 5–15)
BUN: 21 mg/dL — AB (ref 6–20)
CHLORIDE: 115 mmol/L — AB (ref 101–111)
CO2: 22 mmol/L (ref 22–32)
Calcium: 8.6 mg/dL — ABNORMAL LOW (ref 8.9–10.3)
Creatinine, Ser: 0.63 mg/dL (ref 0.44–1.00)
GFR calc Af Amer: >60 mL/min (ref >60)
GFR calc non Af Amer: >60 mL/min (ref >60)
GLUCOSE: 80 mg/dL (ref 65–99)
POTASSIUM: 4.5 mmol/L (ref 3.5–5.1)
Sodium: 141 mmol/L (ref 135–145)

## 2017-01-09 MED ORDER — METHYLPHENIDATE HCL 5 MG PO TABS
5.0000 mg | ORAL_TABLET | Freq: Two times a day (BID) | ORAL | Status: DC
  Administered 2017-01-09: 5 mg via ORAL
  Filled 2017-01-09: qty 1

## 2017-01-09 MED ORDER — JUVEN PO PACK: 1.0000 | PACK | Freq: Two times a day (BID) | ORAL | 0 refills | Status: DC

## 2017-01-09 MED ORDER — CARVEDILOL 6.25 MG PO TABS: 6.2500 mg | ORAL_TABLET | Freq: Two times a day (BID) | ORAL | 0 refills | Status: DC

## 2017-01-09 MED ORDER — ENSURE ENLIVE PO LIQD: 237.0000 mL | Freq: Two times a day (BID) | ORAL | 12 refills | Status: DC

## 2017-01-09 MED ORDER — METHYLPHENIDATE HCL 5 MG PO TABS: 5.0000 mg | ORAL_TABLET | Freq: Two times a day (BID) | ORAL | 0 refills | Status: DC

## 2017-01-09 MED ORDER — BOOST / RESOURCE BREEZE PO LIQD: 1.0000 | Freq: Two times a day (BID) | ORAL | 0 refills | Status: DC

## 2017-01-09 MED ORDER — BISACODYL 5 MG PO TBEC: 5.0000 mg | DELAYED_RELEASE_TABLET | Freq: Every day | ORAL | 0 refills | Status: DC | PRN

## 2017-01-09 MED ORDER — ADULT MULTIVITAMIN LIQUID CH: 15.0000 mL | Freq: Every day | ORAL | 0 refills | Status: DC

## 2017-01-09 MED ORDER — MIRTAZAPINE 15 MG PO TBDP: 15.0000 mg | ORAL_TABLET | Freq: Every day | ORAL | Status: DC

## 2017-01-09 MED ORDER — MIRTAZAPINE 15 MG PO TBDP: 15.0000 mg | ORAL_TABLET | Freq: Every day | ORAL | 0 refills | Status: DC

## 2017-01-09 MED FILL — MIRTAZAPINE 15 MG ODT: 15 | 30 days supply | Qty: 30 | Fill #0

## 2017-01-09 MED FILL — CARVEDILOL 6.25 MG TABLET: 6.25 | 30 days supply | Qty: 60 | Fill #0

## 2017-01-09 MED FILL — METHYLPHENIDATE 5 MG TABLET: 5 | 30 days supply | Qty: 60 | Fill #0

## 2017-01-09 NOTE — Evaluation (Signed)
Physical Therapy Evaluation Patient Details Name: Julie Stein MRN: [id number] DOB: 12-06-1934 Today's Date: 01/09/2017   History of Present Illness   Julie Stein is a 82 y.o. female with extensive cardiac history listed below including systolic heart failure, cardiomyopathy, CAD, hypertension, Stroke in 2016,  SVT, questionable brought by EMS after having been found unresponsive by daughter after a syncopal episode lasting  about 15 minutes   Clinical Impression  Pt admitted with syncope and presented to PT with weakness and low energy.  Pt will benefit from HHPT to maximize function and decrease burden of care fy family.     Follow Up Recommendations Home health PT;Supervision for mobility/OOB    Equipment Recommendations  None recommended by PT    Recommendations for Other Services       Precautions / Restrictions Precautions Precautions: Fall Restrictions Weight Bearing Restrictions: No      Mobility  Bed Mobility Overal bed mobility: Needs Assistance Bed Mobility: Supine to Sit     Supine to sit: Min assist     General bed mobility comments: moderate use of the rail  Transfers Overall transfer level: Needs assistance   Transfers: Sit to/from Stand;Stand Pivot Transfers Sit to Stand: Min assist;Mod assist Stand pivot transfers: Min assist       General transfer comment: assist based on pt's willingness at the time to mobilize  Ambulation/Gait Ambulation/Gait assistance: Min assist Ambulation Distance (Feet): 25 Feet (x2) Assistive device: Rolling walker (2 wheeled) Gait Pattern/deviations: Step-through pattern Gait velocity: slower Gait velocity interpretation: Below normal speed for age/gender General Gait Details: stability assist throughout and assist for RW direction  Stairs            Wheelchair Mobility    Modified Rankin (Stroke Patients Only)       Balance Overall balance assessment: Needs assistance Sitting-balance support: No upper  extremity supported;Single extremity supported Sitting balance-Leahy Scale: Fair     Standing balance support: Bilateral upper extremity supported;Single extremity supported Standing balance-Leahy Scale: Poor Standing balance comment: reliant on RW or external support                             Pertinent Vitals/Pain Pain Assessment: Faces Faces Pain Scale: No hurt    Home Living Family/patient expects to be discharged to:: Private residence Living Arrangements: Other (Comment) (grand children permanently with her) Available Help at Discharge: Available 24 hours/day Type of Home: House Home Access: Stairs to enter Entrance Stairs-Rails: Psychiatric nurse of Steps: 4 Home Layout: One level Home Equipment: Walker - 2 wheels;Bedside commode      Prior Function Level of Independence: Needs assistance   Gait / Transfers Assistance Needed: assist with RW  ADL's / Homemaking Assistance Needed: assisted in shower and for clothes/toileting        Hand Dominance        Extremity/Trunk Assessment   Upper Extremity Assessment Upper Extremity Assessment: Defer to OT evaluation    Lower Extremity Assessment Lower Extremity Assessment: Generalized weakness       Communication   Communication: No difficulties  Cognition Arousal/Alertness: Awake/alert   Overall Cognitive Status: History of cognitive impairments - at baseline                      General Comments      Exercises     Assessment/Plan    PT Assessment All further PT needs can be met in the next venue  of care  PT Problem List Decreased strength;Decreased activity tolerance;Decreased balance;Decreased mobility       PT Treatment Interventions      PT Goals (Current goals can be found in the Care Plan section)  Acute Rehab PT Goals Patient Stated Goal: go home PT Goal Formulation: With patient    Frequency     Barriers to discharge        Co-evaluation                End of Session   Activity Tolerance: Patient tolerated treatment well;Patient limited by fatigue Patient left: in chair;with call bell/phone within reach;with family/visitor present Nurse Communication: Mobility status PT Visit Diagnosis: Unsteadiness on feet (R26.81);Muscle weakness (generalized) (M62.81)         Time: 3437-3578 PT Time Calculation (min) (ACUTE ONLY): 39 min   Charges:   PT Evaluation $PT Eval Moderate Complexity: 1 Procedure PT Treatments $Gait Training: 8-22 mins $Therapeutic Activity: 8-22 mins   PT G CodesTessie Fass Sherrye Puga 01/09/2017, 11:32 AM 01/09/2017  Donnella Sham, Dora [phone]  (pager)

## 2017-01-09 NOTE — Discharge Summary (Addendum)
Physician Discharge Summary  Julie Stein HUT:[id number] DOB: 10/08/1935 DOA: 01/06/2017  PCP: Concepcion Living, NP  Admit date: 01/06/2017 Discharge date: 01/09/2017   Recommendations for Outpatient Follow-Up:   Home health dys 3 diet Rittalin: 5 mg PO BID (AM and noon) for two days then 10 mg QAM and Qnoon for 2 days then 20 mg Qam and Qnoon thereafter. Monitor for response. If no response after 4-5 days at goal dose then unlikely to have any benefit.  Recommend geriatric psychiatry consultation Would discuss remeron with neurology on Monday before filling/starting   Discharge Diagnosis:   Active Problems:   Essential hypertension   Acute systolic CHF (congestive heart failure), NYHA class 4 (HCC)   Paroxysmal SVT (supraventricular tachycardia) (HCC)   Syncope   Chronic systolic heart failure (HCC)   Microcytic hypochromic anemia   H/O: CVA (cerebrovascular accident)   Anemia due to stage 4 chronic kidney disease treated with darbepoetin (HCC)   Protein-calorie malnutrition, moderate (HCC)   Physical deconditioning   Confusion   Altered mental status   Hypokalemia due to inadequate potassium intake   Hypotension   Stroke-like episode (HCC)   Pressure injury of skin   Discharge disposition:  Home. With family caregivers 24/7  Discharge Condition: Improved.  Diet recommendation: dys  Wound care: None.   History of Present Illness:   Julie Stein is a 82 y.o. female with extensive cardiac history listed below including systolic heart failure, cardiomyopathy, CAD, hypertension, Stroke in 2016,  SVT, questionable brought by [facility] after having been found unresponsive by daughter after a syncopal episode lasting  about 15 minutes "she was breathing about 2 times a minute", then improved after nasal airway and bag were placed. History is obtained by her daughter, as the patient has dementia, unable to provide. No apaprentt infections, or sick contacts. The patient has been in a  steady clinical decline over the last 2 to 3 months. She has not been able to eat, or drink as much as before, in fact, her cardiologist recently placed her on Megace to improve her appetite. No nausea or vomiting. No diarrhea. She has a history of anemia, and has been taking Epo injections at the cancer center. Of note, the patient has not been complaining of chest pain or palpitations. She has a history of vasovagal syndrome in Oct of 2017,.No respiratory complaints. No lower extremity swelling. No apparent unilateral weakness or decrease in sensation or headaches or blurred vision. She may have had left facial droop.  No fever chills or night sweats.She walks with a walker.   Hospital Course by Problem:  ? Abulia -seen by neuro -ritalin started with above titration-- to be seen by neuro on Monday as outpatient -consider remeron start after seen by neuro  Syncope, likely multifactorial given strong cardiac history, in addition to poor oral intake, dehydration and +-med induced.versus CVA versus seizure .EKG unrevealing. Unlikely seizure related, does not appear postictal . CT head negative for acute intracranial abnormalities. Afebrile.  -2 D echo: Normal LV size with mild LV hypertrophy. EF 60-65%. Normal RV size and systolic function. No significant valvularabnormalities  MRI head negative for acute issues EEG diffuse slowing  Chronic Systolic CHFCXR without CHF Last 2 D echo in 1/17 with EF 60-65. Weight 35.4 kg  Cards consult apprecaited- coreg and aldactone restarted monitor I/Os and daily weights prn 02  History of CVA  MRI negative for new CVA  Hypertension  -cards to adjust meds  Chronic kidney disease stage 3  BMP daily Estimated Creatinine Clearance: 33 mL/min (by C-G formula based on SCr of 0.72 mg/dL).  Hypokalemia -replaced  Hypernatremia  -resolved with IVF -encourage PO water intake  Anemia of chronic disease Patient on Epo as OP, due for next  does in March at the [facility]  Weight loss, anorexia, deconditioning. 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:    Neuro  cards   Discharge Exam:   Vitals:   01/08/17 1946 01/09/17 0500  BP: 127/79 125/69  Pulse: 83 86  Resp: 18   Temp: 98.2 F (36.8 C) 99 F (37.2 C)   Vitals:   01/08/17 1402 01/08/17 1946 01/09/17 0212 01/09/17 0500  BP: (!) 149/86 127/79  125/69  Pulse: 81 83  86  Resp: 18 18    Temp: 98.6 F (37 C) 98.2 F (36.8 C)  99 F (37.2 C)  TempSrc: Oral Oral  Oral  SpO2: 100% 98%  100%  Weight:   35.7 kg (78 lb 12.8 oz)   Height:        Gen:  NAD- up with PT   The results of significant diagnostics from this hospitalization (including imaging, microbiology, ancillary and laboratory) are listed below for reference.     Procedures and Diagnostic Studies:   Dg Chest 2 View  Result Date: 01/06/2017 CLINICAL DATA:  82 year old female without chest pain. Confusion. Initial encounter. EXAM: CHEST  2 VIEW COMPARISON:  08/31/2015. FINDINGS: No infiltrate, congestive heart failure or pneumothorax. Blunting posterior sulci suggestive of small pleural effusions/pleural thickening. No plain film evidence of pulmonary malignancy. Scoliosis with distortion of the mediastinal and cardiac silhouette. Heart size within normal limits. Calcified tortuous aorta. Bones are osteopenic. IMPRESSION: Blunting posterior sulci may represent presence of small pleural effusions or pleural thickening. No segmental infiltrate or congestive heart failure. Aortic atherosclerosis. Electronically Signed   By: Lacy Duverney  M.D.   On: 01/06/2017 14:12   Ct Head Wo Contrast  Result Date: 01/06/2017 CLINICAL DATA:  82 year old female syncope with fall and confusion. Initial encounter. EXAM: CT HEAD WITHOUT CONTRAST TECHNIQUE: Contiguous axial images were obtained from the base of the skull through the vertex without intravenous contrast. COMPARISON:  08/28/2015 MR and 08/27/2015 CT. FINDINGS: Brain: No intracranial hemorrhage. Chronic microvascular changes without CT evidence of large acute infarct. Mild global atrophy without hydrocephalus. No intracranial mass lesion noted on this unenhanced exam. Vascular: Vascular calcifications. Skull: No skull fracture. Sinuses/Orbits: No acute orbital abnormality. Partial opacification right mastoid air cells without obstructing lesion of eustachian tube noted. Minimal mucosal thickening right maxillary sinus. Other: Negative IMPRESSION: No skull fracture or intracranial hemorrhage. Chronic microvascular changes without CT evidence of large acute infarct. Partial opacification right mastoid air cells. Minimal mucosal thickening right maxillary sinus. Electronically Signed   By: Lacy Duverney M.D.   On: 01/06/2017 14:28   Mri Brain Without Contrast  Result Date: 01/06/2017 CLINICAL DATA:  Syncope.  Hypertension. EXAM: MRI HEAD WITHOUT CONTRAST TECHNIQUE: Multiplanar, multiecho pulse sequences of the brain and surrounding structures were obtained without intravenous contrast. COMPARISON:  CT head 01/06/2017.  MRI 08/28/2015 FINDINGS: Brain: Negative for acute infarct. Atrophy with chronic microvascular  ischemic change throughout the white matter. Chronic infarct in the anterior corpus callosum. Small chronic infarct in the right medial parietal lobe. Multiple areas of chronic microhemorrhage throughout the brain consistent with the history of hypertension. These were present previously. No mass or fluid collection. No shift of the midline structures. Vascular: Normal arterial flow voids.  Skull and upper cervical spine: Negative Sinuses/Orbits: Mucosal edema paranasal sinuses. Bilateral mastoid sinus effusion. Negative orbit. Other: None IMPRESSION: No acute intracranial abnormality Atrophy with moderate chronic microvascular ischemia. Multiple areas of chronic microhemorrhage in the brain consistent with the history of hypertension. Electronically Signed   By: Marlan Palau M.D.   On: 01/06/2017 20:07     Labs:   Basic Metabolic Panel:  Recent Labs Lab 01/02/17 1203 01/06/17 1309 01/07/17 0622 01/07/17 1121 01/08/17 0804 01/09/17 0424  NA 147* 147* 147*  --  146* 141  K 2.8* 2.9* 2.8*  --  4.7 4.5  CL 109 110 112*  --  117* 115*  CO2 31 28 24   --  22 22  GLUCOSE 86 81 82  --  65 80  BUN 14 17 13   --  11 21*  CREATININE 0.79 0.76 0.78  --  0.72 0.63  CALCIUM 8.8* 8.4* 8.6*  --  8.7* 8.6*  MG  --   --   --  2.4  --   --    GFR Estimated Creatinine Clearance: 32.1 mL/min (by C-G formula based on SCr of 0.63 mg/dL). Liver Function Tests:  Recent Labs Lab 01/02/17 1203 01/07/17 0622  AST 18 20  ALT 10* 10*  ALKPHOS 53 52  BILITOT 0.7 0.6  PROT 5.7* 5.1*  ALBUMIN 2.2* 2.1*   No results for input(s): LIPASE, AMYLASE in the last 168 hours.  Recent Labs Lab 01/07/17 1405  AMMONIA 18   Coagulation profile No results for input(s): INR, PROTIME in the last 168 hours.  CBC:  Recent Labs Lab 01/06/17 1309 01/07/17 0622  WBC 4.4 6.2  HGB 9.3* 10.0*  HCT 29.0* 30.1*  MCV 80.8 79.6  PLT 270 286   Cardiac Enzymes: No results for input(s): CKTOTAL, CKMB, CKMBINDEX, TROPONINI in the last 168 hours. BNP: Invalid input(s): POCBNP CBG:  Recent Labs Lab 01/06/17 1259 01/07/17 0553 01/08/17 0630 01/09/17 0431  GLUCAP 70 90 75 75   D-Dimer No results for input(s): DDIMER in the last 72 hours. Hgb A1c No results for input(s): HGBA1C in the last 72 hours. Lipid Profile No results for input(s): CHOL, HDL, LDLCALC, TRIG, CHOLHDL, LDLDIRECT in the  last 72 hours. Thyroid function studies No results for input(s): TSH, T4TOTAL, T3FREE, THYROIDAB in the last 72 hours.  Invalid input(s): FREET3 Anemia work up No results for input(s): VITAMINB12, FOLATE, FERRITIN, TIBC, IRON, RETICCTPCT in the last 72 hours. Microbiology Recent Results (from the past 240 hour(s))  Urine culture     Status: None   Collection Time: 01/06/17  2:48 PM  Result Value Ref Range Status   Specimen Description URINE, RANDOM  Final   Special Requests NONE  Final   Culture NO GROWTH  Final   Report Status 01/07/2017 FINAL  Final     Discharge Instructions:   Discharge Instructions    Discharge instructions    Complete by:  As directed    dys 3 diet 5 mg PO BID (AM and noon) for two days then 10 mg QAM and Qnoon for 2 days then 20 mg Qam and Qnoon thereafter. Monitor for response. If no  response after 4-5 days at goal dose then unlikely to have any benefit.  Recommend geriatric psychiatry consultation Would discuss remeron with neurology on Monday before filling/starting   Increase activity slowly    Complete by:  As directed      Allergies as of 01/09/2017      Reactions   Demerol [meperidine] Other (See Comments)   Hallucinations, out of body experience      Medication List    STOP taking these medications   hydrALAZINE 25 MG tablet Commonly known as:  APRESOLINE   isosorbide dinitrate 20 MG tablet Commonly known as:  ISORDIL     TAKE these medications   aspirin 81 MG EC tablet Take 1 tablet (81 mg total) by mouth daily.   bisacodyl 5 MG EC tablet Commonly known as:  DULCOLAX Take 1 tablet (5 mg total) by mouth daily as needed for moderate constipation.   carvedilol 6.25 MG tablet Commonly known as:  COREG Take 1 tablet (6.25 mg total) by mouth 2 (two) times daily with a meal. What changed:  medication strength  how much to take   feeding supplement Liqd Take 1 Container by mouth 2 (two) times daily with a meal.   feeding  supplement (ENSURE ENLIVE) Liqd Take 237 mLs by mouth 2 (two) times daily between meals.   nutrition supplement (JUVEN) Pack Take 1 packet by mouth 2 (two) times daily after a meal.   megestrol 400 MG/10ML suspension Commonly known as:  MEGACE Take 10 mLs (400 mg total) by mouth daily. What changed:  how much to take   methylphenidate 5 MG tablet Commonly known as:  RITALIN Take 1 tablet (5 mg total) by mouth 2 (two) times daily with breakfast and lunch.   mirtazapine 15 MG disintegrating tablet Commonly known as:  REMERON SOL-TAB Take 1 tablet (15 mg total) by mouth at bedtime.   multivitamin Liqd Take 15 mLs by mouth daily. Start taking on:  01/10/2017   nitroGLYCERIN 0.4 MG SL tablet Commonly known as:  NITROSTAT Place 1 tablet (0.4 mg total) under the tongue every 5 (five) minutes as needed for chest pain.   spironolactone 25 MG tablet Commonly known as:  ALDACTONE Take 1 tablet (25 mg total) by mouth daily.      Follow-up Information    [facility] Follow up.   Why:  They will continue to do your home health care at your home Contact information: [address] [place] [address] [phone]            Time coordinating discharge: 35 min  Signed:  Brendy Ficek U Brynja Marker   [facility] 01/09/2017, 11:14 AM

## 2017-01-09 NOTE — Progress Notes (Addendum)
Patient ID: Julie Stein, female   DOB: 12/08/1934, 82 y.o.   MRN: [id number]     SUBJECTIVE: No complaints this morning, no events on telemetry overnight.  Echo: EF 60-65%, mild LVH.   MRI L and C-spine: No cordal compression.    Vitals:   01/08/17 1402 01/08/17 1946 01/09/17 0212 01/09/17 0500  BP: (!) 149/86 127/79  125/69  Pulse: 81 83  86  Resp: 18 18    Temp: 98.6 F (37 C) 98.2 F (36.8 C)  99 F (37.2 C)  TempSrc: Oral Oral  Oral  SpO2: 100% 98%  100%  Weight:   78 lb 12.8 oz (35.7 kg)   Height:        Intake/Output Summary (Last 24 hours) at 01/09/17 0927 Last data filed at 01/08/17 1500  Gross per 24 hour  Intake              175 ml  Output                0 ml  Net              175 ml    LABS: Basic Metabolic Panel:  Recent Labs  01/07/17 1121 01/08/17 0804 01/09/17 0424  NA  --  146* 141  K  --  4.7 4.5  CL  --  117* 115*  CO2  --  22 22  GLUCOSE  --  65 80  BUN  --  11 21*  CREATININE  --  0.72 0.63  CALCIUM  --  8.7* 8.6*  MG 2.4  --   --    Liver Function Tests:  Recent Labs  01/07/17 0622  AST 20  ALT 10*  ALKPHOS 52  BILITOT 0.6  PROT 5.1*  ALBUMIN 2.1*   No results for input(s): LIPASE, AMYLASE in the last 72 hours. CBC:  Recent Labs  01/06/17 1309 01/07/17 0622  WBC 4.4 6.2  HGB 9.3* 10.0*  HCT 29.0* 30.1*  MCV 80.8 79.6  PLT 270 286   Cardiac Enzymes: No results for input(s): CKTOTAL, CKMB, CKMBINDEX, TROPONINI in the last 72 hours. BNP: Invalid input(s): POCBNP D-Dimer: No results for input(s): DDIMER in the last 72 hours. Hemoglobin A1C: No results for input(s): HGBA1C in the last 72 hours. Fasting Lipid Panel: No results for input(s): CHOL, HDL, LDLCALC, TRIG, CHOLHDL, LDLDIRECT in the last 72 hours. Thyroid Function Tests: No results for input(s): TSH, T4TOTAL, T3FREE, THYROIDAB in the last 72 hours.  Invalid input(s): FREET3 Anemia Panel: No results for input(s): VITAMINB12, FOLATE, FERRITIN, TIBC, IRON,  RETICCTPCT in the last 72 hours.  RADIOLOGY: Dg Chest 2 View  Result Date: 01/06/2017 CLINICAL DATA:  82 year old female without chest pain. Confusion. Initial encounter. EXAM: CHEST  2 VIEW COMPARISON:  08/31/2015. FINDINGS: No infiltrate, congestive heart failure or pneumothorax. Blunting posterior sulci suggestive of small pleural effusions/pleural thickening. No plain film evidence of pulmonary malignancy. Scoliosis with distortion of the mediastinal and cardiac silhouette. Heart size within normal limits. Calcified tortuous aorta. Bones are osteopenic. IMPRESSION: Blunting posterior sulci may represent presence of small pleural effusions or pleural thickening. No segmental infiltrate or congestive heart failure. Aortic atherosclerosis. Electronically Signed   By: Genia Del M.D.   On: 01/06/2017 14:12   Ct Head Wo Contrast  Result Date: 01/06/2017 CLINICAL DATA:  82 year old female syncope with fall and confusion. Initial encounter. EXAM: CT HEAD WITHOUT CONTRAST TECHNIQUE: Contiguous axial images were obtained from the base of the skull through the  vertex without intravenous contrast. COMPARISON:  08/28/2015 MR and 08/27/2015 CT. FINDINGS: Brain: No intracranial hemorrhage. Chronic microvascular changes without CT evidence of large acute infarct. Mild global atrophy without hydrocephalus. No intracranial mass lesion noted on this unenhanced exam. Vascular: Vascular calcifications. Skull: No skull fracture. Sinuses/Orbits: No acute orbital abnormality. Partial opacification right mastoid air cells without obstructing lesion of eustachian tube noted. Minimal mucosal thickening right maxillary sinus. Other: Negative IMPRESSION: No skull fracture or intracranial hemorrhage. Chronic microvascular changes without CT evidence of large acute infarct. Partial opacification right mastoid air cells. Minimal mucosal thickening right maxillary sinus. Electronically Signed   By: Genia Del M.D.   On:  01/06/2017 14:28   Mri Brain Without Contrast  Result Date: 01/06/2017 CLINICAL DATA:  Syncope.  Hypertension. EXAM: MRI HEAD WITHOUT CONTRAST TECHNIQUE: Multiplanar, multiecho pulse sequences of the brain and surrounding structures were obtained without intravenous contrast. COMPARISON:  CT head 01/06/2017.  MRI 08/28/2015 FINDINGS: Brain: Negative for acute infarct. Atrophy with chronic microvascular ischemic change throughout the white matter. Chronic infarct in the anterior corpus callosum. Small chronic infarct in the right medial parietal lobe. Multiple areas of chronic microhemorrhage throughout the brain consistent with the history of hypertension. These were present previously. No mass or fluid collection. No shift of the midline structures. Vascular: Normal arterial flow voids. Skull and upper cervical spine: Negative Sinuses/Orbits: Mucosal edema paranasal sinuses. Bilateral mastoid sinus effusion. Negative orbit. Other: None IMPRESSION: No acute intracranial abnormality Atrophy with moderate chronic microvascular ischemia. Multiple areas of chronic microhemorrhage in the brain consistent with the history of hypertension. Electronically Signed   By: Franchot Gallo M.D.   On: 01/06/2017 20:07   Mr Cervical Spine Wo Contrast  Result Date: 01/09/2017 CLINICAL DATA:  Leg weakness and difficulty walking EXAM: MRI CERVICAL, THORACIC AND LUMBAR SPINE WITHOUT CONTRAST TECHNIQUE: Multiplanar and multiecho pulse sequences of the cervical spine, to include the craniocervical junction and cervicothoracic junction, and thoracic and lumbar spine, were obtained without intravenous contrast. COMPARISON:  None. FINDINGS: MRI CERVICAL SPINE FINDINGS Alignment: Normal Vertebrae: No acute compression fracture, discitis-osteomyelitis, facet edema or other focal marrow lesion. No epidural collection. Cord: Normal signal and caliber Posterior Fossa, vertebral arteries, paraspinal tissues: Unremarkable Disc levels:  C1-C2: Normal. C2-C3: Normal disc space and facets. No spinal canal or neuroforaminal stenosis. C3-C4: Small disc bulge without spinal canal stenosis. Mild right foraminal narrowing. C4-C5: Normal disc space and facets. No spinal canal or neuroforaminal stenosis. C5-C6: Small disc osteophyte complex with moderate bilateral foraminal narrowing. No spinal canal stenosis. C6-C7: Small disc osteophyte complex. No spinal canal stenosis. Moderate bilateral foraminal narrowing. C7-T1: Normal disc space and facets. No spinal canal or neuroforaminal stenosis. MRI THORACIC SPINE FINDINGS Alignment:  Normal Vertebrae: There is a small hyperintense T2 weighted signal lesion within the T6 vertebral body with associated low T1 weighted signal. Otherwise, the bone marrow is normal. No acute compression fracture. No discitis osteomyelitis. Cord:  Normal signal and caliber Paraspinal and other soft tissues: Small right pleural effusion Disc levels: No spinal canal or neural foraminal stenosis. MRI LUMBAR SPINE FINDINGS Segmentation:  Normal Alignment:  Normal Vertebrae: Low T1/ high T2 weighted signal lesion in the L2 vertebral body. Otherwise, the marrow signal is normal. Conus medullaris: Extends to the L1-L2 level and appears normal. Paraspinal and other soft tissues: There are left renal cysts. Otherwise unremarkable. Disc levels: T12-L1: Normal disc space and facets. No spinal canal or neuroforaminal stenosis. L1-L2: Normal disc space and facets. No spinal canal or  neuroforaminal stenosis. L2-L3: Normal disc space and facets. No spinal canal or neuroforaminal stenosis. L3-L4: Minimal disc bulge. No spinal canal or neural foraminal stenosis. L4-L5: Minimal disc bulge without spinal canal or neural foraminal stenosis. L5-S1: Small disc bulge without spinal canal stenosis. Mild narrowing of both lateral recesses. No neural foraminal stenosis. IMPRESSION: 1. No cord compression or severe neural impingement. 2. Low T1 weighted  signal marrow lesions within the T6 and L2 vertebral bodies, indeterminate. In the absence of any known primary malignancy, these most likely are fat-poor hemangiomas. A neoplastic lesion would be difficult to exclude, however. Further assessment with IV contrast may be helpful. 3. Multilevel moderate foraminal narrowing within the cervical spine, greatest at C5-C6 and C6-C7. 4. Mild bilateral lateral recess narrowing at L5-S1. Correlate for S1 radiculopathy. 5. Small right pleural effusion. Electronically Signed   By: Ulyses Jarred M.D.   On: 01/09/2017 01:57   Mr Thoracic Spine Wo Contrast  Result Date: 01/09/2017 CLINICAL DATA:  Leg weakness and difficulty walking EXAM: MRI CERVICAL, THORACIC AND LUMBAR SPINE WITHOUT CONTRAST TECHNIQUE: Multiplanar and multiecho pulse sequences of the cervical spine, to include the craniocervical junction and cervicothoracic junction, and thoracic and lumbar spine, were obtained without intravenous contrast. COMPARISON:  None. FINDINGS: MRI CERVICAL SPINE FINDINGS Alignment: Normal Vertebrae: No acute compression fracture, discitis-osteomyelitis, facet edema or other focal marrow lesion. No epidural collection. Cord: Normal signal and caliber Posterior Fossa, vertebral arteries, paraspinal tissues: Unremarkable Disc levels: C1-C2: Normal. C2-C3: Normal disc space and facets. No spinal canal or neuroforaminal stenosis. C3-C4: Small disc bulge without spinal canal stenosis. Mild right foraminal narrowing. C4-C5: Normal disc space and facets. No spinal canal or neuroforaminal stenosis. C5-C6: Small disc osteophyte complex with moderate bilateral foraminal narrowing. No spinal canal stenosis. C6-C7: Small disc osteophyte complex. No spinal canal stenosis. Moderate bilateral foraminal narrowing. C7-T1: Normal disc space and facets. No spinal canal or neuroforaminal stenosis. MRI THORACIC SPINE FINDINGS Alignment:  Normal Vertebrae: There is a small hyperintense T2 weighted signal  lesion within the T6 vertebral body with associated low T1 weighted signal. Otherwise, the bone marrow is normal. No acute compression fracture. No discitis osteomyelitis. Cord:  Normal signal and caliber Paraspinal and other soft tissues: Small right pleural effusion Disc levels: No spinal canal or neural foraminal stenosis. MRI LUMBAR SPINE FINDINGS Segmentation:  Normal Alignment:  Normal Vertebrae: Low T1/ high T2 weighted signal lesion in the L2 vertebral body. Otherwise, the marrow signal is normal. Conus medullaris: Extends to the L1-L2 level and appears normal. Paraspinal and other soft tissues: There are left renal cysts. Otherwise unremarkable. Disc levels: T12-L1: Normal disc space and facets. No spinal canal or neuroforaminal stenosis. L1-L2: Normal disc space and facets. No spinal canal or neuroforaminal stenosis. L2-L3: Normal disc space and facets. No spinal canal or neuroforaminal stenosis. L3-L4: Minimal disc bulge. No spinal canal or neural foraminal stenosis. L4-L5: Minimal disc bulge without spinal canal or neural foraminal stenosis. L5-S1: Small disc bulge without spinal canal stenosis. Mild narrowing of both lateral recesses. No neural foraminal stenosis. IMPRESSION: 1. No cord compression or severe neural impingement. 2. Low T1 weighted signal marrow lesions within the T6 and L2 vertebral bodies, indeterminate. In the absence of any known primary malignancy, these most likely are fat-poor hemangiomas. A neoplastic lesion would be difficult to exclude, however. Further assessment with IV contrast may be helpful. 3. Multilevel moderate foraminal narrowing within the cervical spine, greatest at C5-C6 and C6-C7. 4. Mild bilateral lateral recess narrowing at L5-S1.  Correlate for S1 radiculopathy. 5. Small right pleural effusion. Electronically Signed   By: Ulyses Jarred M.D.   On: 01/09/2017 01:57   Mr Lumbar Spine Wo Contrast  Result Date: 01/09/2017 CLINICAL DATA:  Leg weakness and difficulty  walking EXAM: MRI CERVICAL, THORACIC AND LUMBAR SPINE WITHOUT CONTRAST TECHNIQUE: Multiplanar and multiecho pulse sequences of the cervical spine, to include the craniocervical junction and cervicothoracic junction, and thoracic and lumbar spine, were obtained without intravenous contrast. COMPARISON:  None. FINDINGS: MRI CERVICAL SPINE FINDINGS Alignment: Normal Vertebrae: No acute compression fracture, discitis-osteomyelitis, facet edema or other focal marrow lesion. No epidural collection. Cord: Normal signal and caliber Posterior Fossa, vertebral arteries, paraspinal tissues: Unremarkable Disc levels: C1-C2: Normal. C2-C3: Normal disc space and facets. No spinal canal or neuroforaminal stenosis. C3-C4: Small disc bulge without spinal canal stenosis. Mild right foraminal narrowing. C4-C5: Normal disc space and facets. No spinal canal or neuroforaminal stenosis. C5-C6: Small disc osteophyte complex with moderate bilateral foraminal narrowing. No spinal canal stenosis. C6-C7: Small disc osteophyte complex. No spinal canal stenosis. Moderate bilateral foraminal narrowing. C7-T1: Normal disc space and facets. No spinal canal or neuroforaminal stenosis. MRI THORACIC SPINE FINDINGS Alignment:  Normal Vertebrae: There is a small hyperintense T2 weighted signal lesion within the T6 vertebral body with associated low T1 weighted signal. Otherwise, the bone marrow is normal. No acute compression fracture. No discitis osteomyelitis. Cord:  Normal signal and caliber Paraspinal and other soft tissues: Small right pleural effusion Disc levels: No spinal canal or neural foraminal stenosis. MRI LUMBAR SPINE FINDINGS Segmentation:  Normal Alignment:  Normal Vertebrae: Low T1/ high T2 weighted signal lesion in the L2 vertebral body. Otherwise, the marrow signal is normal. Conus medullaris: Extends to the L1-L2 level and appears normal. Paraspinal and other soft tissues: There are left renal cysts. Otherwise unremarkable. Disc  levels: T12-L1: Normal disc space and facets. No spinal canal or neuroforaminal stenosis. L1-L2: Normal disc space and facets. No spinal canal or neuroforaminal stenosis. L2-L3: Normal disc space and facets. No spinal canal or neuroforaminal stenosis. L3-L4: Minimal disc bulge. No spinal canal or neural foraminal stenosis. L4-L5: Minimal disc bulge without spinal canal or neural foraminal stenosis. L5-S1: Small disc bulge without spinal canal stenosis. Mild narrowing of both lateral recesses. No neural foraminal stenosis. IMPRESSION: 1. No cord compression or severe neural impingement. 2. Low T1 weighted signal marrow lesions within the T6 and L2 vertebral bodies, indeterminate. In the absence of any known primary malignancy, these most likely are fat-poor hemangiomas. A neoplastic lesion would be difficult to exclude, however. Further assessment with IV contrast may be helpful. 3. Multilevel moderate foraminal narrowing within the cervical spine, greatest at C5-C6 and C6-C7. 4. Mild bilateral lateral recess narrowing at L5-S1. Correlate for S1 radiculopathy. 5. Small right pleural effusion. Electronically Signed   By: Ulyses Jarred M.D.   On: 01/09/2017 01:57    PHYSICAL EXAM General: NAD Neck: No JVD, no thyromegaly or thyroid nodule.  Lungs: Clear to auscultation bilaterally with normal respiratory effort. CV: Nondisplaced PMI.  Heart regular S1/S2, +S4, no murmur.  No peripheral edema.  No carotid bruit.  Normal pedal pulses.  Abdomen: Soft, nontender, no hepatosplenomegaly, no distention.  Neurologic: Alert and oriented x 3.  Psych: Normal affect. Extremities: No clubbing or cyanosis.   TELEMETRY: Reviewed telemetry pt in NSR, no events overnight  ASSESSMENT AND PLAN: 1. Syncope: Cause uncertain.  ?Due to meds/orthostasis/poor po intake though meds recently cut back.  She has a history of SVT (  possible AVNRT), cannot rule this out as a cause.  Echo unchanged with normal EF.  No arrhythmias  overnight on telemetry.  Possible that hypokalemia could have triggered an arrhythmic event, however, prior to admission.  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  1 short (about 10 beats) run noted since hospitalization, was off Coreg at the time.  Restarted Coreg 6.25 mg bid.  3. Chronic systolic CHF: Mixed ischemic/nonischemic cardiomyopathy, HTN likely played a role.  Echo 2/21 shows that EF remains improved, 60-65%.  4. CAD: Stopped statin recently given worsened mental status (confusion, dementia) to see if this helps any.  Continue ASA 81 daily. Probably will eventually restart statin.  5. CVA: No new stroke on imaging.  6. Weight loss, fatigue/weakness, confusion, anemia, poor appetite: No localizing symptoms to suggest cancer. ESR was normal, NH3 was normal. MRI spine showed no cordal compression.  Has been followed by hematology, have thought anemia of renal disease with elevated creatinine relative to her size. She is going to be seeing a nephrologist. Given confusion/memory difficulty, neurology now following.  It is possible that depression is the root cause of her current symptoms.  Would favor trial of anti-depressant.   7. Hypokalemia: Now resolved.  ?If this could have triggered an arrhythmia. Restarted home spironolactone 25 mg daily.  8. HTN: Would continue Coreg 6.25 mg bid and restarted home spironolactone 25 daily with hypokalemia.   9. Malnutrition: Encourage po intake. Need to treat depression.   I will see in the office after discharge, call for any questions.    Loralie Champagne 01/09/2017 9:27 AM

## 2017-01-12 ENCOUNTER — Telehealth: Payer: Self-pay | Admitting: Cardiology

## 2017-01-12 ENCOUNTER — Ambulatory Visit (INDEPENDENT_AMBULATORY_CARE_PROVIDER_SITE_OTHER): Payer: Medicaid Other | Admitting: Neurology

## 2017-01-12 ENCOUNTER — Encounter: Payer: Self-pay | Admitting: Neurology

## 2017-01-12 VITALS — BP 145/88 | HR 69 | Ht 59.0 in | Wt 77.0 lb

## 2017-01-12 DIAGNOSIS — F0391 Unspecified dementia with behavioral disturbance: Secondary | ICD-10-CM

## 2017-01-12 DIAGNOSIS — R410 Disorientation, unspecified: Secondary | ICD-10-CM | POA: Diagnosis not present

## 2017-01-12 DIAGNOSIS — R269 Unspecified abnormalities of gait and mobility: Secondary | ICD-10-CM

## 2017-01-12 DIAGNOSIS — R2990 Unspecified symptoms and signs involving the nervous system: Secondary | ICD-10-CM

## 2017-01-12 DIAGNOSIS — F0390 Unspecified dementia without behavioral disturbance: Secondary | ICD-10-CM | POA: Insufficient documentation

## 2017-01-12 DIAGNOSIS — R634 Abnormal weight loss: Secondary | ICD-10-CM

## 2017-01-12 DIAGNOSIS — I639 Cerebral infarction, unspecified: Secondary | ICD-10-CM

## 2017-01-12 DIAGNOSIS — R4182 Altered mental status, unspecified: Secondary | ICD-10-CM | POA: Diagnosis not present

## 2017-01-12 MED ORDER — LEVETIRACETAM 100 MG/ML PO SOLN: 500.0000 mg | Freq: Two times a day (BID) | ORAL | 11 refills | Status: DC

## 2017-01-12 MED FILL — LEVETIRACETAM 100 MG/ML SOL: 100 | 30 days supply | Qty: 300 | Fill #0

## 2017-01-12 NOTE — Telephone Encounter (Signed)
Daughter called her mother's BP was 150 and at one point diastolic was 106, she gave her mother hydralazine and isosorbide and BP 98 systolic.  I instructed to hold hydralazine and only give isosorbide 20 mg TID.  If BP continues to climb to call the office.

## 2017-01-12 NOTE — Progress Notes (Signed)
PATIENT: Julie Stein DOB: 1935/01/25  Chief Complaint  Patient presents with  . Syncopal Episodes    She is here with her daughters, Julie Stein and Rasheedah.  She is here for further evaluation of two syncopal episodes.  The first event was October 2016 and the second event was 01/06/17.  They would like to review the test results from the hospital, especially the EEG.  . Memory Loss    MMSE 14/30 - 5 animals.  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  But around October 2016 she had a sudden change, she was admitted to the hospital for acute pulmonary edema, I reviewed and summarized the hospital record, ejection fraction then showed 20-25%, I personally reviewed MRI of the brainwithout contrast in October 2016, few small foci of patchy DWI within the left frontal lobe, and right frontal parietal region,atrophy, moderate periventricular white matter disease, scattered small chronic microhemorrhages,MRA of the brain showed no large vessel disease.  She had rapid decline since that hospital admission, she gradually become more confused, over the past 6 months, had significant loss of appetite,35 pound weight loss,she needs help on her daily activity,become much less active, uses walker at home, lost control of bowel and bladder, daughter also reported that patient has visual hallucination  I reviewed and  summarized her most recent hospital admission on January 06 2017, after eating breakfast on February 20, she suddenly slumped over, unresponsive, lasting about 15 minutes, then gradually came around, there was no seizure activity noticed, it was diagnosed as a syncope, likely multifactorial, giving strong cardiac history, in addition poor oral intake, dehydration, mild polypharmacy,  EKG showed no significant abnormality, echocardiogram showed normal left ventricular size, mild left ventricular hypertrophy, ejection fraction 60-65%,  EEG showed diffuse slowing,  Creatinine showed estimated GFR 33 related to her per minutes, anemia due to chronic kidney disease,  I personally reviewed MRI of the brain with and without contrast of January 06 2017, no acute abnormality, generalized atrophy, moderate chronic microvascular ischemia, multiple area of chronic microhemorrhage in the brain consistent with history of hypertension. MRI of cervical, thoracic, lumbar spine showed mild degenerative changes, no significant cord compression. Foraminal stenosis  Laboratory evaluations in January 2018, BMP showed elevated BUN 21, calcium was mildly decreased 8.6, ammonia was normal 18, normal magnesium 2.4, CBC showed hemoglobin 10.0, elevated RDW 17.7, CMP showed low potassium 2.8, creatinine 0.78, ESR 12, B12 820, normal TSH in October 2017  REVIEW OF SYSTEMS: Full 14 system review of systems performed and notable only for Rapid weight loss, fatigue, swelling legs, trouble swallowing, snoring, incontinence, feeling cold,passing out,  ALLERGIES: Allergies  Allergen Reactions  . Demerol [Meperidine] Other (See Comments)    Hallucinations, out of body experience    HOME MEDICATIONS: Current Outpatient Prescriptions  Medication Sig Dispense Refill  . aspirin EC 81 MG EC tablet Take 1 tablet (81 mg total)  by mouth daily. 30 tablet 0  . bisacodyl (DULCOLAX) 5 MG EC tablet Take 1 tablet (5 mg total) by mouth  daily as needed for moderate constipation. 30 tablet 0  . carvedilol (COREG) 6.25 MG tablet Take 1 tablet (6.25 mg total) by mouth 2 (two) times daily with a meal. 60 tablet 0  . feeding supplement (BOOST / RESOURCE BREEZE) LIQD Take 1 Container by mouth 2 (two) times daily with a meal.  0  . feeding supplement, ENSURE ENLIVE, (ENSURE ENLIVE) LIQD Take 237 mLs by mouth 2 (two) times daily between meals. 237 mL 12  . megestrol (MEGACE) 400 MG/10ML suspension Take 10 mLs (400 mg total) by mouth daily. (Patient taking differently: Take 200-400 mg by mouth daily. ) 240 mL 0  . methylphenidate (RITALIN) 5 MG tablet Take 1 tablet (5 mg total) by mouth 2 (two) times daily with breakfast and lunch. 60 tablet 0  . mirtazapine (REMERON SOL-TAB) 15 MG disintegrating tablet Take 1 tablet (15 mg total) by mouth at bedtime. 30 tablet 0  . Multiple Vitamin (MULTIVITAMIN) LIQD Take 15 mLs by mouth daily. 870 mL 0  . nitroGLYCERIN (NITROSTAT) 0.4 MG SL tablet Place 1 tablet (0.4 mg total) under the tongue every 5 (five) minutes as needed for chest pain. 30 tablet 12  . nutrition supplement, JUVEN, (JUVEN) PACK Take 1 packet by mouth 2 (two) times daily after a meal. 60 packet 0  . spironolactone (ALDACTONE) 25 MG tablet Take 1 tablet (25 mg total) by mouth daily. 90 tablet 3   No current facility-administered medications for this visit.     PAST MEDICAL HISTORY: Past Medical History:  Diagnosis Date  . Anemia    due to stage 4 chronic kidney disease treated with darbepoetin Julie Stein 01/06/2017  . CAD (coronary artery disease)   . Cardiomyopathy ([place])   . CHF (congestive heart failure) ([place])   . Chronic kidney disease (CKD), stage IV (severe) ([place])    Julie Stein 01/06/2017  . Hypertension   . Hypochromic microcytic anemia    Julie Stein 01/06/2017  . Moderate protein-calorie malnutrition ([place])    Julie Stein 01/06/2017  . Physical deconditioning    Julie Stein 01/06/2017  . Stroke ([place])   . SVT (supraventricular tachycardia)  ([place])   . Syncopal episodes     PAST SURGICAL HISTORY: Past Surgical History:  Procedure Laterality Date  . CARDIAC CATHETERIZATION N/A 08/27/2015   Procedure: Right/Left Heart Cath and Coronary Angiography;  Surgeon: Julie Dresser, MD;  Location: [facility];  Service: Cardiovascular;  Laterality: N/A;  . ELECTROPHYSIOLOGIC STUDY N/A 09/05/2015   Procedure: SVT Ablation;  Surgeon: Julie Meredith Leeds, MD;  Location: [facility];  Service: Cardiovascular;  Laterality: N/A;  . KNEE SURGERY      FAMILY HISTORY: Family History  Problem Relation Age of Onset  . Hypertension Daughter   . Other Mother     unsure of medical history-says she passed away from an accident  . Other Father     unsure of medical history-says he passed away from natual causes    SOCIAL HISTORY:  Social History   Social History  . Marital status: Married    Spouse name: N/A  . Number of children: 4  . Years of education: Bachelors   Occupational History  . 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 Smoking status: Never Smoker  . Smokeless tobacco: Never Used  . Alcohol use No  . Drug  use: No  . Sexual activity: No   Other Topics Concern  . Not on file   Social History Narrative   Lives at home with grandson and granddaughter.   Right-handed.   No caffeine use.     PHYSICAL EXAM   Vitals:   01/12/17 1056  BP: (!) 145/88  Pulse: 69  Weight: 77 lb (34.9 kg)  Height: '4\' 11"'$  (1.499 m)    Not recorded      Body mass index is 15.55 kg/m.  PHYSICAL EXAMNIATION:  Gen: NAD, conversant, well nourised, obese, well groomed                     Cardiovascular: Regular rate rhythm, no peripheral edema, warm, nontender. Eyes: Conjunctivae clear without exudates or hemorrhage Neck: Supple, no carotid bruits. Pulmonary: Clear to auscultation bilaterally   NEUROLOGICAL EXAM:  MENTAL STATUS: Speech:    Speech is normal; fluent and spontaneous with normal  comprehension.  Cognition:Mini-Mental Status Examination 14/30, animal naming 5, rely on her daughter to provide history     Orientation: She is not oriented to date, year, place   Recent and remote ;memory: She missed 1/3 recall      Attention span and concentration, could not spell world backwards, difficulty focusing    Language, naming, repeating,spontaneous speech: Difficulty naming, following 3 step command, could not copy design, could not write a full sentence     Fund of knowledge   CRANIAL NERVES: CN II: Visual fields are full to confrontation. Fundoscopic exam is normal with sharp discs and no vascular changes. Pupils are round equal and briskly reactive to light. CN III, IV, VI: extraocular movement are normal. No ptosis. CN V: Facial sensation is intact to pinprick in all 3 divisions bilaterally. Corneal responses are intact.  CN VII: Face is symmetric with normal eye closure and smile. CN VIII: Hearing is normal to rubbing fingers CN IX, X: Palate elevates symmetrically. Phonation is normal. CN XI: Head turning and shoulder shrug are intact CN XII: Tongue is midline with normal movements and no atrophy.  MOTOR: She has mild diffuse rigidity, no significant weakness  REFLEXES: Reflexes are 2+ and symmetric at the biceps, triceps, knees, and ankles. Plantar responses are flexor.  SENSORY: Intact to light touch, pinprick, positional sensation and vibratory sensation are intact in fingers and toes.  COORDINATION: Rapid alternating movements and fine finger movements are intact. There is no dysmetria on finger-to-nose and heel-knee-shin.    GAIT/STANCE: She needs assistance to get up from seated position, leaning backwards,   DIAGNOSTIC DATA (LABS, IMAGING, TESTING) - I reviewed patient records, labs, notes, testing and imaging myself where available.   ASSESSMENT AND PLAN  Julie Stein is a 82 y.o. female   Dementia with visual hallucination Rapid decline, weight  loss Passing out episode on January 06 2017,  Differentiation diagnosis includes central nervous system degenerative disorder, with vascular component  Daughter reported rapid decline, Julie check inflammatory markers,paraneoplastic panels  CT of chest, abdomen, pelvic to rule out neoplastic process  Worsening confusion  She was noted to have difficulty with attention, frequent right mouth twitching right hemi-neglect  Possibility also include subclinical seizure, repeat EEG  Empirically treat her with Keppra 500 mg twice a day  Julie Stein, M.D. Ph.D.  [facility] [address] [place], [place] [address] Ph: [phone] Fax: [phone]  CC: Micheline Chapman, NP

## 2017-01-13 ENCOUNTER — Telehealth (HOSPITAL_COMMUNITY): Payer: Self-pay | Admitting: Vascular Surgery

## 2017-01-13 NOTE — Telephone Encounter (Signed)
Pt daughter left message with answering service , pt BLOOD PRESSURE IS HIGH AND ANKLES ARE PUFFY .. Please advise

## 2017-01-15 ENCOUNTER — Other Ambulatory Visit: Payer: Self-pay | Admitting: Nephrology

## 2017-01-15 DIAGNOSIS — N189 Chronic kidney disease, unspecified: Secondary | ICD-10-CM

## 2017-01-16 ENCOUNTER — Observation Stay (HOSPITAL_COMMUNITY): Payer: Medicare Other

## 2017-01-16 ENCOUNTER — Encounter (HOSPITAL_COMMUNITY): Payer: Self-pay | Admitting: *Deleted

## 2017-01-16 ENCOUNTER — Inpatient Hospital Stay (HOSPITAL_COMMUNITY)
Admission: EM | Admit: 2017-01-16 | Discharge: 2017-01-18 | DRG: 312 | Disposition: A | Discharge status: Home / Self Care | Payer: Medicare Other | Attending: Internal Medicine | Admitting: Internal Medicine

## 2017-01-16 DIAGNOSIS — Z8249 Family history of ischemic heart disease and other diseases of the circulatory system: Secondary | ICD-10-CM

## 2017-01-16 DIAGNOSIS — Z681 Body mass index (BMI) 19 or less, adult: Secondary | ICD-10-CM

## 2017-01-16 DIAGNOSIS — D631 Anemia in chronic kidney disease: Secondary | ICD-10-CM | POA: Diagnosis present

## 2017-01-16 DIAGNOSIS — I2510 Atherosclerotic heart disease of native coronary artery without angina pectoris: Secondary | ICD-10-CM | POA: Diagnosis present

## 2017-01-16 DIAGNOSIS — E440 Moderate protein-calorie malnutrition: Secondary | ICD-10-CM | POA: Diagnosis present

## 2017-01-16 DIAGNOSIS — R55 Syncope and collapse: Secondary | ICD-10-CM

## 2017-01-16 DIAGNOSIS — I5022 Chronic systolic (congestive) heart failure: Secondary | ICD-10-CM | POA: Diagnosis present

## 2017-01-16 DIAGNOSIS — G40909 Epilepsy, unspecified, not intractable, without status epilepticus: Secondary | ICD-10-CM

## 2017-01-16 DIAGNOSIS — Z7982 Long term (current) use of aspirin: Secondary | ICD-10-CM

## 2017-01-16 DIAGNOSIS — I130 Hypertensive heart and chronic kidney disease with heart failure and stage 1 through stage 4 chronic kidney disease, or unspecified chronic kidney disease: Secondary | ICD-10-CM | POA: Diagnosis present

## 2017-01-16 DIAGNOSIS — E876 Hypokalemia: Secondary | ICD-10-CM | POA: Diagnosis present

## 2017-01-16 DIAGNOSIS — F0390 Unspecified dementia without behavioral disturbance: Secondary | ICD-10-CM | POA: Diagnosis present

## 2017-01-16 DIAGNOSIS — E860 Dehydration: Secondary | ICD-10-CM | POA: Diagnosis present

## 2017-01-16 DIAGNOSIS — R6889 Other general symptoms and signs: Secondary | ICD-10-CM | POA: Diagnosis present

## 2017-01-16 DIAGNOSIS — G9340 Encephalopathy, unspecified: Secondary | ICD-10-CM | POA: Diagnosis present

## 2017-01-16 DIAGNOSIS — I951 Orthostatic hypotension: Principal | ICD-10-CM | POA: Diagnosis present

## 2017-01-16 DIAGNOSIS — Z885 Allergy status to narcotic agent status: Secondary | ICD-10-CM

## 2017-01-16 DIAGNOSIS — R402252 Coma scale, best verbal response, oriented, at arrival to emergency department: Secondary | ICD-10-CM | POA: Diagnosis present

## 2017-01-16 DIAGNOSIS — I10 Essential (primary) hypertension: Secondary | ICD-10-CM | POA: Diagnosis present

## 2017-01-16 DIAGNOSIS — R402362 Coma scale, best motor response, obeys commands, at arrival to emergency department: Secondary | ICD-10-CM | POA: Diagnosis present

## 2017-01-16 DIAGNOSIS — R634 Abnormal weight loss: Secondary | ICD-10-CM | POA: Diagnosis present

## 2017-01-16 DIAGNOSIS — R414 Neurologic neglect syndrome: Secondary | ICD-10-CM | POA: Diagnosis present

## 2017-01-16 DIAGNOSIS — E870 Hyperosmolality and hypernatremia: Secondary | ICD-10-CM | POA: Diagnosis present

## 2017-01-16 DIAGNOSIS — Z8673 Personal history of transient ischemic attack (TIA), and cerebral infarction without residual deficits: Secondary | ICD-10-CM

## 2017-01-16 DIAGNOSIS — D509 Iron deficiency anemia, unspecified: Secondary | ICD-10-CM | POA: Diagnosis present

## 2017-01-16 DIAGNOSIS — I429 Cardiomyopathy, unspecified: Secondary | ICD-10-CM | POA: Diagnosis present

## 2017-01-16 DIAGNOSIS — N184 Chronic kidney disease, stage 4 (severe): Secondary | ICD-10-CM | POA: Diagnosis present

## 2017-01-16 DIAGNOSIS — R402142 Coma scale, eyes open, spontaneous, at arrival to emergency department: Secondary | ICD-10-CM | POA: Diagnosis present

## 2017-01-16 LAB — COMPREHENSIVE METABOLIC PANEL
ALT: 13 U/L — ABNORMAL LOW (ref 14–54)
AST: 25 U/L (ref 15–41)
Albumin: 2.1 g/dL — ABNORMAL LOW (ref 3.5–5.0)
Alkaline Phosphatase: 53 U/L (ref 38–126)
Anion gap: 6 (ref 5–15)
BUN: 16 mg/dL (ref 6–20)
CHLORIDE: 118 mmol/L — AB (ref 101–111)
CO2: 22 mmol/L (ref 22–32)
Calcium: 8.4 mg/dL — ABNORMAL LOW (ref 8.9–10.3)
Creatinine, Ser: 0.81 mg/dL (ref 0.44–1.00)
Glucose, Bld: 87 mg/dL (ref 65–99)
Potassium: 3.5 mmol/L (ref 3.5–5.1)
Sodium: 146 mmol/L — ABNORMAL HIGH (ref 135–145)
Total Bilirubin: 0.9 mg/dL (ref 0.3–1.2)
Total Protein: 5 g/dL — ABNORMAL LOW (ref 6.5–8.1)

## 2017-01-16 LAB — CBC WITH DIFFERENTIAL/PLATELET
BASOS ABS: 0 10*3/uL (ref 0.0–0.1)
BASOS PCT: 1 %
EOS ABS: 0.1 10*3/uL (ref 0.0–0.7)
EOS PCT: 1 %
HCT: 27.7 % — ABNORMAL LOW (ref 36.0–46.0)
Hemoglobin: 9.1 g/dL — ABNORMAL LOW (ref 12.0–15.0)
LYMPHS PCT: 19 %
Lymphs Abs: 1 10*3/uL (ref 0.7–4.0)
MCH: 26.7 pg (ref 26.0–34.0)
MCHC: 32.9 g/dL (ref 30.0–36.0)
MCV: 81.2 fL (ref 78.0–100.0)
MONO ABS: 0.3 10*3/uL (ref 0.1–1.0)
Monocytes Relative: 5 %
Neutro Abs: 4.1 10*3/uL (ref 1.7–7.7)
Neutrophils Relative %: 74 %
PLATELETS: 180 10*3/uL (ref 150–400)
RBC: 3.41 MIL/uL — AB (ref 3.87–5.11)
RDW: 19.5 % — AB (ref 11.5–15.5)
WBC: 5.5 10*3/uL (ref 4.0–10.5)

## 2017-01-16 LAB — TROPONIN I

## 2017-01-16 MED ORDER — SODIUM CHLORIDE 0.9 % IV SOLN
INTRAVENOUS | Status: DC
  Administered 2017-01-16: 10 mL/h via INTRAVENOUS

## 2017-01-16 MED ORDER — LEVETIRACETAM 100 MG/ML PO SOLN
500.0000 mg | Freq: Two times a day (BID) | ORAL | Status: DC
  Administered 2017-01-16 – 2017-01-17 (×2): 500 mg via ORAL
  Filled 2017-01-16 (×4): qty 5

## 2017-01-16 MED ORDER — SODIUM CHLORIDE 0.9 % IV BOLUS (SEPSIS)
500.0000 mL | Freq: Once | INTRAVENOUS | Status: AC
  Administered 2017-01-16: 500 mL via INTRAVENOUS

## 2017-01-16 MED ORDER — CARVEDILOL 12.5 MG PO TABS: 6.2500 mg | ORAL_TABLET | Freq: Two times a day (BID) | ORAL | Status: DC

## 2017-01-16 MED ORDER — ENOXAPARIN SODIUM 30 MG/0.3ML ~~LOC~~ SOLN
30.0000 mg | SUBCUTANEOUS | Status: DC
  Administered 2017-01-16 – 2017-01-17 (×2): 30 mg via SUBCUTANEOUS
  Filled 2017-01-16 (×2): qty 0.3

## 2017-01-16 MED ORDER — SODIUM CHLORIDE 0.9 % IV SOLN
INTRAVENOUS | Status: AC
Start: 2017-01-16 — End: 2017-01-17
  Administered 2017-01-16: 16:00:00 via INTRAVENOUS

## 2017-01-16 MED ORDER — MIRTAZAPINE 15 MG PO TBDP
15.0000 mg | ORAL_TABLET | Freq: Every day | ORAL | Status: DC
  Filled 2017-01-16 (×3): qty 1

## 2017-01-16 MED ORDER — CARVEDILOL 12.5 MG PO TABS
6.2500 mg | ORAL_TABLET | Freq: Two times a day (BID) | ORAL | Status: DC
  Administered 2017-01-17 – 2017-01-18 (×3): 6.25 mg via ORAL
  Filled 2017-01-16 (×3): qty 1

## 2017-01-16 MED ORDER — ADULT MULTIVITAMIN LIQUID CH
15.0000 mL | Freq: Every day | ORAL | Status: DC
  Administered 2017-01-17 – 2017-01-18 (×2): 15 mL via ORAL
  Filled 2017-01-16 (×2): qty 15

## 2017-01-16 MED ORDER — ASPIRIN EC 81 MG PO TBEC
81.0000 mg | DELAYED_RELEASE_TABLET | Freq: Every day | ORAL | Status: DC
  Administered 2017-01-17 – 2017-01-18 (×2): 81 mg via ORAL
  Filled 2017-01-16 (×2): qty 1

## 2017-01-16 MED ORDER — METHYLPHENIDATE HCL 5 MG PO TABS
5.0000 mg | ORAL_TABLET | Freq: Two times a day (BID) | ORAL | Status: DC
  Administered 2017-01-17 – 2017-01-18 (×4): 5 mg via ORAL
  Filled 2017-01-16 (×4): qty 1

## 2017-01-16 MED ORDER — SODIUM CHLORIDE 0.9% FLUSH
3.0000 mL | Freq: Two times a day (BID) | INTRAVENOUS | Status: DC
  Administered 2017-01-17: 3 mL via INTRAVENOUS

## 2017-01-16 MED ORDER — MEGESTROL ACETATE 400 MG/10ML PO SUSP
200.0000 mg | Freq: Every day | ORAL | Status: DC
  Administered 2017-01-17 – 2017-01-18 (×2): 400 mg via ORAL
  Filled 2017-01-16 (×2): qty 10

## 2017-01-16 MED ORDER — ENSURE ENLIVE PO LIQD
237.0000 mL | Freq: Two times a day (BID) | ORAL | Status: DC
  Administered 2017-01-17: 237 mL via ORAL

## 2017-01-16 MED ORDER — JUVEN PO PACK
1.0000 | PACK | Freq: Two times a day (BID) | ORAL | Status: DC
  Administered 2017-01-16 – 2017-01-18 (×4): 1 via ORAL
  Filled 2017-01-16 (×5): qty 1

## 2017-01-16 MED ORDER — BOOST / RESOURCE BREEZE PO LIQD
1.0000 | Freq: Two times a day (BID) | ORAL | Status: DC
  Administered 2017-01-17 – 2017-01-18 (×3): 1 via ORAL

## 2017-01-16 NOTE — Procedures (Signed)
History: 82 year old female with a history of syncope and dementia  Sedation: None  Technique: This is a 21 channel routine scalp EEG performed at the bedside with bipolar and monopolar montages arranged in accordance to the international 10/20 system of electrode placement. One channel was dedicated to EKG recording.    Background: There is a posterior dominant rhythm of 8 Hz. There is mild irregular generalized delta and theta activity. With drowsiness there is an increase in slow activity.  Photic stimulation: Physiologic driving is not performed  EEG Abnormalities: 1) generalized irregular slow activity  Clinical Interpretation: This EEG is consistent with a generalized non-specific cerebral dysfunction(encephalopathy). There was no seizure or seizure predisposition recorded on this study. Please note that a normal EEG does not preclude the possibility of epilepsy.   Ritta Slot, MD [facility] [phone]  If 7pm- 7am, please page neurology on call as listed in [facility].

## 2017-01-16 NOTE — ED Notes (Signed)
When pt stood up she stated she felt dizzy and needed to sit down before the V/S were done. Pt stayed CAOx4 throughout.

## 2017-01-16 NOTE — ED Triage Notes (Signed)
Pt had witnessed syncopal episode while sitting down eating breakfast. Family reported it lasted approx 2 mins. Family reports pt waking up and more altered than normal.

## 2017-01-16 NOTE — Progress Notes (Signed)
EEG completed; results pending.    

## 2017-01-16 NOTE — ED Notes (Signed)
Freida Busman, MD at bedside talking with family re: plan of care

## 2017-01-16 NOTE — ED Notes (Signed)
Pt will be transported upstairs after EEG

## 2017-01-16 NOTE — ED Provider Notes (Signed)
[facility]-EMERGENCY DEPT Provider Note   CSN: [id number] Arrival date & time: 01/16/17  1045     History   Chief Complaint No chief complaint on file.   HPI Julie Stein is a 82 y.o. female.  82 year old female here after witnessed syncopal event just prior to arrival. Per EMS, family stated that patient was sitting down eating and then had about 2 minute episode of where she was unresponsive. No seizure activity noted. There is no cyanosis noted. When patient low, she was not postictal. Patient states that she feels back to her baseline. EMS was called and when he got the patient's blood pressure was 80 systolic and was treated with IV fluids. Her CBG was above 100. Patient denies any recent illnesses or recent medication changes.       Past Medical History:  Diagnosis Date  . Anemia    due to stage 4 chronic kidney disease treated with darbepoetin Hattie Perch 01/06/2017  . CAD (coronary artery disease)   . Cardiomyopathy (HCC)   . CHF (congestive heart failure) (HCC)   . Chronic kidney disease (CKD), stage IV (severe) (HCC)    Hattie Perch 01/06/2017  . Hypertension   . Hypochromic microcytic anemia    Hattie Perch 01/06/2017  . Moderate protein-calorie malnutrition (HCC)    Hattie Perch 01/06/2017  . Physical deconditioning    Hattie Perch 01/06/2017  . Stroke (HCC)   . SVT (supraventricular tachycardia) (HCC)   . Syncopal episodes     Patient Active Problem List   Diagnosis Date Noted  . Dementia 01/12/2017  . Rapid weight loss 01/12/2017  . Pressure injury of skin 01/07/2017  . Hypotension   . Stroke-like episode (HCC)   . Acute confusion 11/25/2016  . Altered mental status 11/25/2016  . Hypokalemia due to inadequate potassium intake 11/25/2016  . Dizziness 11/25/2016  . Anemia due to stage 4 chronic kidney disease treated with darbepoetin (HCC) 09/23/2016  . Protein-calorie malnutrition, moderate (HCC) 09/23/2016  . Physical deconditioning 09/23/2016  . Elevated LFTs 04/29/2016  . Pain in the  chest   . H/O: CVA (cerebrovascular accident)   . Syncope and collapse 08/31/2015  . Syncope 08/31/2015  . Chronic systolic heart failure (HCC) 08/31/2015  . Microcytic hypochromic anemia 08/31/2015  . Chest pain   . Hypertension   . Acute on chronic systolic CHF (congestive heart failure) (HCC)   . Acute bilat watershed infarction [facility]) 08/29/2015  . Weakness   . Encounter for central line placement   . Acute respiratory failure with hypercapnia (HCC) 08/26/2015  . Essential hypertension 08/26/2015  . Acute systolic CHF (congestive heart failure), NYHA class 4 (HCC) 08/26/2015  . Pulmonary edema 08/26/2015  . Paroxysmal SVT (supraventricular tachycardia) (HCC) 08/26/2015  . Respiratory failure with hypoxia (HCC) 08/26/2015  . Acute heart failure (HCC)   . Acute respiratory failure with hypoxia (HCC)   . SVT (supraventricular tachycardia) (HCC)     Past Surgical History:  Procedure Laterality Date  . CARDIAC CATHETERIZATION N/A 08/27/2015   Procedure: Right/Left Heart Cath and Coronary Angiography;  Surgeon: Laurey Morale, MD;  Location: [facility];  Service: Cardiovascular;  Laterality: N/A;  . ELECTROPHYSIOLOGIC STUDY N/A 09/05/2015   Procedure: SVT Ablation;  Surgeon: Will Jorja Loa, MD;  Location: [facility];  Service: Cardiovascular;  Laterality: N/A;  . KNEE SURGERY      OB History    No data available       Home Medications    Prior to Admission medications  Medication Sig Start Date End Date Taking? Authorizing Provider  aspirin EC 81 MG EC tablet Take 1 tablet (81 mg total) by mouth daily. 09/06/15   Maryann Mikhail, DO  bisacodyl (DULCOLAX) 5 MG EC tablet Take 1 tablet (5 mg total) by mouth daily as needed for moderate constipation. 01/09/17   Joseph Art, DO  carvedilol (COREG) 6.25 MG tablet Take 1 tablet (6.25 mg total) by mouth 2 (two) times daily with a meal. 01/09/17   Joseph Art, DO  feeding supplement (BOOST / RESOURCE  BREEZE) LIQD Take 1 Container by mouth 2 (two) times daily with a meal. 01/09/17   Joseph Art, DO  feeding supplement, ENSURE ENLIVE, (ENSURE ENLIVE) LIQD Take 237 mLs by mouth 2 (two) times daily between meals. 01/09/17   Joseph Art, DO  levETIRAcetam (KEPPRA) 100 MG/ML solution Take 5 mLs (500 mg total) by mouth 2 (two) times daily. 01/12/17   Levert Feinstein, MD  megestrol (MEGACE) 400 MG/10ML suspension Take 10 mLs (400 mg total) by mouth daily. Patient taking differently: Take 200-400 mg by mouth daily.  09/09/16   Henrietta Hoover, NP  methylphenidate (RITALIN) 5 MG tablet Take 1 tablet (5 mg total) by mouth 2 (two) times daily with breakfast and lunch. 01/09/17   Joseph Art, DO  mirtazapine (REMERON SOL-TAB) 15 MG disintegrating tablet Take 1 tablet (15 mg total) by mouth at bedtime. 01/09/17   Joseph Art, DO  Multiple Vitamin (MULTIVITAMIN) LIQD Take 15 mLs by mouth daily. 01/10/17   Joseph Art, DO  nitroGLYCERIN (NITROSTAT) 0.4 MG SL tablet Place 1 tablet (0.4 mg total) under the tongue every 5 (five) minutes as needed for chest pain. 09/10/15   Mcarthur Rossetti Angiulli, PA-C  nutrition supplement, JUVEN, (JUVEN) PACK Take 1 packet by mouth 2 (two) times daily after a meal. 01/09/17   Joseph Art, DO  spironolactone (ALDACTONE) 25 MG tablet Take 1 tablet (25 mg total) by mouth daily. 01/02/17   Laurey Morale, MD    Family History Family History  Problem Relation Age of Onset  . Hypertension Daughter   . Other Mother     unsure of medical history-says she passed away from an accident  . Other Father     unsure of medical history-says he passed away from natual causes    Social History Social History  Substance Use Topics  . Smoking status: Never Smoker  . Smokeless tobacco: Never Used  . Alcohol use No     Allergies   Demerol [meperidine]   Review of Systems Review of Systems  All other systems reviewed and are negative.    Physical Exam Updated Vital  Signs There were no vitals taken for this visit.  Physical Exam  Constitutional: She is oriented to person, place, and time. She appears well-developed and well-nourished.  Non-toxic appearance. No distress.  HENT:  Head: Normocephalic and atraumatic.  Eyes: Conjunctivae, EOM and lids are normal. Pupils are equal, round, and reactive to light.  Neck: Normal range of motion. Neck supple. No tracheal deviation present. No thyroid mass present.  Cardiovascular: Normal rate, regular rhythm and normal heart sounds.  Exam reveals no gallop.   No murmur heard. Pulmonary/Chest: Effort normal and breath sounds normal. No stridor. No respiratory distress. She has no decreased breath sounds. She has no wheezes. She has no rhonchi. She has no rales.  Abdominal: Soft. Normal appearance and bowel sounds are normal. She exhibits no distension. There is  no tenderness. There is no rebound and no CVA tenderness.  Musculoskeletal: Normal range of motion. She exhibits no edema or tenderness.  Neurological: She is alert and oriented to person, place, and time. She has normal strength. No cranial nerve deficit or sensory deficit. GCS eye subscore is 4. GCS verbal subscore is 5. GCS motor subscore is 6.  Skin: Skin is warm and dry. No abrasion and no rash noted.  Psychiatric: She has a normal mood and affect. Her speech is normal and behavior is normal.  Nursing note and vitals reviewed.    ED Treatments / Results  Labs (all labs ordered are listed, but only abnormal results are displayed) Labs Reviewed - No data to display  EKG  EKG Interpretation None       Radiology No results found.  Procedures Procedures (including critical care time)  Medications Ordered in ED Medications - No data to display   Initial Impression / Assessment and Plan / ED Course  I have reviewed the triage vital signs and the nursing notes.  Pertinent labs & imaging results that were available during my care of the  patient were reviewed by me and considered in my medical decision making (see chart for details).     Patient to be admitted for evaluation of syncope.  Final Clinical Impressions(s) / ED Diagnoses   Final diagnoses:  None    New Prescriptions New Prescriptions   No medications on file     Lorre Nick, MD 01/16/17 [id number]

## 2017-01-16 NOTE — H&P (Signed)
History and Physical    Ever Glave VHQ:[id number] DOB: 1935-03-14 DOA: 01/16/2017   PCP: Concepcion Living, NP   Patient coming from/Resides with: Private residence  Admission status: Observation/telemetry -it may be medically necessary to stay a minimum 2 midnights to rule out impending and/or unexpected changes in physiologic status that may differ from initial evaluation performed in the ER and/or at time of admission therefore consider reevaluation of admission status in 24 hours.   Chief Complaint: Witnessed syncopal episode  HPI: Julie Stein is a 82 y.o. female with medical history significant for history of combined ischemic/nonischemic cardiomyopathy with recovered EF now 60-65% with underlying chronic diastolic dysfunction, remote CVA, progressive weight loss/protein calorie malnutrition, dementia with progressive weakness and confusion easily diagnosed with abulia, CAD. Patient was recently discharged on 2/23 after an episode of witnessed syncope. Felt likely due to meds and orthostasis stimulation to poor oral intake. He also has history of SVT and this potentially could've contributed to her preadmission symptoms. During hospitalization she was witnessed to have a short burst of 10 beats of SVT. At time of discharge her carvedilol was restarted at 6.25 mg twice a day as was her Aldactone which had been held during initial portion of the evaluation. She had some altered mental status and generalized weakness with neurologic evaluation consistent with abulia-methylphenidate was initiated and recommendation given for geriatric psychiatric consultation.   Since discharge she has followed up with neurologist/Yan; patient again demonstrating worsening confusion with difficulty with attention, frequent right mouth twitching and right hemi-neglect. Consideration was given to the possibility of subclinical seizure so a repeat EEG was ordered and she was empirically started on Keppra 500 mg twice a  day. That afternoon patient's family called the cardiology NP on call reporting that the patient's blood pressure was 150 systolic with a diastolic of 106 and the family had given the patient a dose of hydralazine and isosorbide with subsequent change in blood pressure to 98 systolic. Instructions were given to the family to hold the hydralazine and only give isosorbide 20 mg 3 times a day and to call back if blood pressure continued to rise. Today the patient had a witnessed syncopal episode while seated and eating breakfast with duration of altered mentation/loss of consciousness for 2 minutes. Upon wakening, the patient was more altered but did not appear to have a postictal phase but this was also unclear. In the ER patient was orthostatic with drop in systolic blood pressure from 139 supine to 106 standing.  ED Course:  Vital Signs: BP (!) 163/101   Pulse 70   Temp 97.4 F (36.3 C) (Oral)   Resp 18   SpO2 100%  Lab data: Sodium 146, potassium 3.5, chloride 118, glucose 87, BUN 16, creatinine 0.81, calcium 8.4, LFTs not elevated, albumin 2.1, white count 5500, hemoglobin 9.1, platelets 180,000, differential within normal limits Medications and treatments: None  Review of Systems:  In addition to the HPI above,  No Fever-chills, myalgias or other constitutional symptoms No Headache, changes with Vision or hearing, new weakness, tingling, numbness in any extremity, dizziness, dysarthria or word finding difficulty, tremors or seizure activity No problems swallowing food or Liquids, indigestion/reflux, choking or coughing while eating, abdominal pain with or after eating No Chest pain, Cough or Shortness of Breath, palpitations, orthopnea or DOE No Abdominal pain, N/V, melena,hematochezia, dark tarry stools, constipation No dysuria, malodorous urine, hematuria or flank pain No new skin rashes, lesions, masses or bruises, No new joint pains, aches, swelling or redness  No recent unintentional  weight gain or loss No polyuria, polydypsia or polyphagia   Past Medical History:  Diagnosis Date  . Anemia    due to stage 4 chronic kidney disease treated with darbepoetin Hattie Perch 01/06/2017  . CAD (coronary artery disease)   . Cardiomyopathy (HCC)   . CHF (congestive heart failure) (HCC)   . Chronic kidney disease (CKD), stage IV (severe) (HCC)    Hattie Perch 01/06/2017  . Hypertension   . Hypochromic microcytic anemia    Hattie Perch 01/06/2017  . Moderate protein-calorie malnutrition (HCC)    Hattie Perch 01/06/2017  . Physical deconditioning    Hattie Perch 01/06/2017  . Stroke (HCC)   . SVT (supraventricular tachycardia) (HCC)   . Syncopal episodes     Past Surgical History:  Procedure Laterality Date  . CARDIAC CATHETERIZATION N/A 08/27/2015   Procedure: Right/Left Heart Cath and Coronary Angiography;  Surgeon: Laurey Morale, MD;  Location: [facility] INVASIVE CV LAB;  Service: Cardiovascular;  Laterality: N/A;  . ELECTROPHYSIOLOGIC STUDY N/A 09/05/2015   Procedure: SVT Ablation;  Surgeon: Will Jorja Loa, MD;  Location: [facility] INVASIVE CV LAB;  Service: Cardiovascular;  Laterality: N/A;  . KNEE SURGERY      Social History   Social History  . Marital status: Married    Spouse name: N/A  . Number of children: 4  . Years of education: Bachelors   Occupational History  . 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 Smoking status: Never Smoker  . Smokeless tobacco: Never Used  . Alcohol use No  . Drug use: No  . Sexual activity: No   Other Topics Concern  . Not on file   Social History Narrative   Lives at home with grandson and granddaughter.   Right-handed.   No caffeine use.    Mobility: Rolling walker Work history: Not obtained   Allergies  Allergen Reactions  . Demerol [Meperidine] Other (See Comments)    Hallucinations, out of body experience    Family History  Problem Relation Age of Onset  . Hypertension Daughter   . Other Mother     unsure of medical  history-says she passed away from an accident  . Other Father     unsure of medical history-says he passed away from natual causes    Prior to Admission medications   Medication Sig Start Date End Date Taking? Authorizing Provider  aspirin EC 81 MG EC tablet Take 1 tablet (81 mg total) by mouth daily. 09/06/15   Maryann Mikhail, DO  bisacodyl (DULCOLAX) 5 MG EC tablet Take 1 tablet (5 mg total) by mouth daily as needed for moderate constipation. 01/09/17   Joseph Art, DO  carvedilol (COREG) 6.25 MG tablet Take 1 tablet (6.25 mg total) by mouth 2 (two) times daily with a meal. 01/09/17   Joseph Art, DO  feeding supplement (BOOST / RESOURCE BREEZE) LIQD Take 1 Container by mouth 2 (two) times daily with a meal. 01/09/17   Joseph Art, DO  feeding supplement, ENSURE ENLIVE, (ENSURE ENLIVE) LIQD Take 237 mLs by mouth 2 (two) times daily between meals. 01/09/17   Joseph Art, DO  levETIRAcetam (KEPPRA) 100 MG/ML solution Take 5 mLs (500 mg total) by mouth 2 (two) times daily. 01/12/17   Levert Feinstein, MD  megestrol (MEGACE) 400 MG/10ML suspension Take 10 mLs (400 mg total) by mouth daily. Patient taking differently: Take 200-400 mg by mouth daily.  09/09/16   Henrietta Hoover,  NP  methylphenidate (RITALIN) 5 MG tablet Take 1 tablet (5 mg total) by mouth 2 (two) times daily with breakfast and lunch. 01/09/17   Joseph Art, DO  mirtazapine (REMERON SOL-TAB) 15 MG disintegrating tablet Take 1 tablet (15 mg total) by mouth at bedtime. 01/09/17   Joseph Art, DO  Multiple Vitamin (MULTIVITAMIN) LIQD Take 15 mLs by mouth daily. 01/10/17   Joseph Art, DO  nitroGLYCERIN (NITROSTAT) 0.4 MG SL tablet Place 1 tablet (0.4 mg total) under the tongue every 5 (five) minutes as needed for chest pain. 09/10/15   Mcarthur Rossetti Angiulli, PA-C  nutrition supplement, JUVEN, (JUVEN) PACK Take 1 packet by mouth 2 (two) times daily after a meal. 01/09/17   Joseph Art, DO    Physical Exam: Vitals:   01/16/17  1330 01/16/17 1400 01/16/17 1430 01/16/17 1500  BP: 143/83 136/77 152/90 (!) 163/101  Pulse: 77 115  70  Resp: 15 16 17 18   Temp:      TempSrc:      SpO2: 100% 100%  100%      Constitutional: NAD, calm, comfortable-Appears underweight Eyes: PERRL, lids and conjunctivae normal ENMT: Mucous membranes are moist. Posterior pharynx clear of any exudate or lesions.Normal dentition.  Neck: normal, supple, no masses, no thyromegaly Respiratory: clear to auscultation bilaterally, no wheezing, no crackles. Normal respiratory effort. No accessory muscle use.  Cardiovascular: Regular rate and rhythm, no murmurs / rubs / gallops. No extremity edema. 2+ pedal pulses. No carotid bruits.  Abdomen: no tenderness, no masses palpated. No hepatosplenomegaly. Bowel sounds positive.  Musculoskeletal: no clubbing / cyanosis. No joint deformity upper and lower extremities. Good ROM, no contractures. Normal muscle tone.  Skin: no rashes, lesions, ulcers. No induration Neurologic: CN 2-12 grossly intact. Sensation intact, DTR normal. Strength 5/5 x all 4 extremities.  Psychiatric: Flat affect, extreme short-term memory deficit, anxious   Labs on Admission: I have personally reviewed following labs and imaging studies  CBC:  Recent Labs Lab 01/16/17 1201  WBC 5.5  NEUTROABS 4.1  HGB 9.1*  HCT 27.7*  MCV 81.2  PLT 180   Basic Metabolic Panel:  Recent Labs Lab 01/16/17 1201  NA 146*  K 3.5  CL 118*  CO2 22  GLUCOSE 87  BUN 16  CREATININE 0.81  CALCIUM 8.4*   GFR: Estimated Creatinine Clearance: 31 mL/min (by C-G formula based on SCr of 0.81 mg/dL). Liver Function Tests:  Recent Labs Lab 01/16/17 1201  AST 25  ALT 13*  ALKPHOS 53  BILITOT 0.9  PROT 5.0*  ALBUMIN 2.1*   No results for input(s): LIPASE, AMYLASE in the last 168 hours. No results for input(s): AMMONIA in the last 168 hours. Coagulation Profile: No results for input(s): INR, PROTIME in the last 168 hours. Cardiac  Enzymes:  Recent Labs Lab 01/16/17 1201  TROPONINI <0.03   BNP (last 3 results) No results for input(s): PROBNP in the last 8760 hours. HbA1C: No results for input(s): HGBA1C in the last 72 hours. CBG: No results for input(s): GLUCAP in the last 168 hours. Lipid Profile: No results for input(s): CHOL, HDL, LDLCALC, TRIG, CHOLHDL, LDLDIRECT in the last 72 hours. Thyroid Function Tests: No results for input(s): TSH, T4TOTAL, FREET4, T3FREE, THYROIDAB in the last 72 hours. Anemia Panel: No results for input(s): VITAMINB12, FOLATE, FERRITIN, TIBC, IRON, RETICCTPCT in the last 72 hours. Urine analysis:    Component Value Date/Time   COLORURINE YELLOW 01/06/2017 1448   APPEARANCEUR HAZY (A) 01/06/2017 1448  LABSPEC 1.021 01/06/2017 1448   LABSPEC 1.010 11/26/2016 1301   PHURINE 6.0 01/06/2017 1448   GLUCOSEU NEGATIVE 01/06/2017 1448   GLUCOSEU Negative 11/26/2016 1301   HGBUR NEGATIVE 01/06/2017 1448   BILIRUBINUR NEGATIVE 01/06/2017 1448   BILIRUBINUR Negative 11/26/2016 1301   KETONESUR NEGATIVE 01/06/2017 1448   PROTEINUR 30 (A) 01/06/2017 1448   UROBILINOGEN 0.2 11/26/2016 1301   NITRITE NEGATIVE 01/06/2017 1448   LEUKOCYTESUR NEGATIVE 01/06/2017 1448   LEUKOCYTESUR Trace 11/26/2016 1301   Sepsis Labs: @LABRCNTIP (procalcitonin:4,lacticidven:4) )No results found for this or any previous visit (from the past 240 hour(s)).   Radiological Exams on Admission: No results found.  EKG: (Independently reviewed) Sinus tachycardia with ventricular rate 101 bpm, QTC 439 ms, normal R-wave progression, no acute ischemic changes  Assessment/Plan Principal Problem:   Orthostatic syncope -Patient presents with witnessed syncope with documented dehydration and orthostasis -Suspect preadmission medications contributing so will hold isosorbide and spironolactone; hold carvedilol and resume in a.m. -Likely needs to utilize diuretic on prn weightbase schedule only -Possible seizure  activity contributing as well (see below) -Rehydrate w/ IV fluids -ck UA/cx to r/o UTI  Active Problems:   Chronic systolic heart failure (mixed ischemic and nonischemic cardiomyopathy)  -Followed by Dr. Jearld Pies in the outpatient setting -Echocardiogram previous admission 01/07/17: EF 60-65%, grade 1 diastolic dysfunction, no pulmonary hypertension -Spironolactone had been briefly held during previous hospitalization due to presumed contributing to syncope/orthostasis but was resumed with recommendations from cardiology to follow orthostatics-have discontinued for now -Resume carvedilol in a.m. -Blood pressure has been very labile at home prompting OP cardiology NP (01/12/17) to recommend giving only low-dose isosorbide 3 times a day-hold this medication for now as well    ?? Seizure disorder  -This diagnosis postulated by outpatient neurologist at follow-up visit on 2/26 with empiric initiation of low-dose Keppra twice a day -Syncopal episode above could also be secondary to seizure activity noting family reporting altered mentation once she began to finally awaken from syncopal episode today -EEG ordered as outpatient but not yet completed so we'll obtain while in the hospital    Dehydration with hypernatremia -Holding diuretic -Saline 500 mL 1 now then 75 mL per hour for 12 hours -A.m. Labs    Hypertension -Blood pressure labile at home (see telephone documentation 01/12/17 per Bryna Colander NP) -Holding home isosorbide and spironolactone for now and will resume carvedilol in a.m. -Given patient's advanced age and poor oral intake she may benefit from more permissive hypertension    Microcytic hypochromic anemia -Hemoglobin stable and at baseline of 9.3-10.0    Abulia -New diagnosis and was started on methylphenidate last admission with outpatient geriatric psych evaluation recommended    H/O: CVA (cerebrovascular accident)    Protein-calorie malnutrition, moderate/weight  loss -Poor oral intake likely contributing to recurrent episodes of orthostasis related to ongoing dehydration exacerbated by diuretic -Patient on appetite stimulants and supplements; family requesting to see if these products can be obtained through home health and this delivered to the home -this may require prescription and would aid in Medicare reimbursement possible-consulted case management    Dementia -See above -Continue preadmission Remeron         DVT prophylaxis: Lovenox  Code Status: Full  Family Communication: Daughters at bedside  Disposition Plan: Home  Consults called: Cardiology/[facility] on-call for social visit/make primary cardiologist aware of admission and circumstances surrounding admission    Feven Alderfer L. ANP-BC [facility] Pager [phone]   If 7PM-7AM, please contact night-coverage [email or web address] Password [facility]  01/16/2017,  3:54 PM

## 2017-01-16 NOTE — ED Notes (Signed)
Gave pt turkey sandwich and water 

## 2017-01-16 NOTE — ED Notes (Signed)
EEG at bedside.

## 2017-01-17 DIAGNOSIS — G9340 Encephalopathy, unspecified: Secondary | ICD-10-CM | POA: Diagnosis present

## 2017-01-17 DIAGNOSIS — I951 Orthostatic hypotension: Principal | ICD-10-CM

## 2017-01-17 DIAGNOSIS — R55 Syncope and collapse: Secondary | ICD-10-CM | POA: Diagnosis present

## 2017-01-17 DIAGNOSIS — I10 Essential (primary) hypertension: Secondary | ICD-10-CM

## 2017-01-17 DIAGNOSIS — I130 Hypertensive heart and chronic kidney disease with heart failure and stage 1 through stage 4 chronic kidney disease, or unspecified chronic kidney disease: Secondary | ICD-10-CM | POA: Diagnosis present

## 2017-01-17 DIAGNOSIS — D631 Anemia in chronic kidney disease: Secondary | ICD-10-CM | POA: Diagnosis present

## 2017-01-17 DIAGNOSIS — G40909 Epilepsy, unspecified, not intractable, without status epilepticus: Secondary | ICD-10-CM | POA: Diagnosis present

## 2017-01-17 DIAGNOSIS — N184 Chronic kidney disease, stage 4 (severe): Secondary | ICD-10-CM | POA: Diagnosis present

## 2017-01-17 DIAGNOSIS — R402252 Coma scale, best verbal response, oriented, at arrival to emergency department: Secondary | ICD-10-CM | POA: Diagnosis present

## 2017-01-17 DIAGNOSIS — E870 Hyperosmolality and hypernatremia: Secondary | ICD-10-CM | POA: Diagnosis present

## 2017-01-17 DIAGNOSIS — I5022 Chronic systolic (congestive) heart failure: Secondary | ICD-10-CM

## 2017-01-17 DIAGNOSIS — R402142 Coma scale, eyes open, spontaneous, at arrival to emergency department: Secondary | ICD-10-CM | POA: Diagnosis present

## 2017-01-17 DIAGNOSIS — F0390 Unspecified dementia without behavioral disturbance: Secondary | ICD-10-CM | POA: Diagnosis present

## 2017-01-17 DIAGNOSIS — Z7982 Long term (current) use of aspirin: Secondary | ICD-10-CM | POA: Diagnosis not present

## 2017-01-17 DIAGNOSIS — I429 Cardiomyopathy, unspecified: Secondary | ICD-10-CM | POA: Diagnosis present

## 2017-01-17 DIAGNOSIS — R402362 Coma scale, best motor response, obeys commands, at arrival to emergency department: Secondary | ICD-10-CM | POA: Diagnosis present

## 2017-01-17 DIAGNOSIS — E876 Hypokalemia: Secondary | ICD-10-CM | POA: Diagnosis present

## 2017-01-17 DIAGNOSIS — F0391 Unspecified dementia with behavioral disturbance: Secondary | ICD-10-CM

## 2017-01-17 DIAGNOSIS — Z681 Body mass index (BMI) 19 or less, adult: Secondary | ICD-10-CM | POA: Diagnosis not present

## 2017-01-17 DIAGNOSIS — R414 Neurologic neglect syndrome: Secondary | ICD-10-CM | POA: Diagnosis present

## 2017-01-17 DIAGNOSIS — Z8249 Family history of ischemic heart disease and other diseases of the circulatory system: Secondary | ICD-10-CM | POA: Diagnosis not present

## 2017-01-17 DIAGNOSIS — E440 Moderate protein-calorie malnutrition: Secondary | ICD-10-CM | POA: Diagnosis present

## 2017-01-17 DIAGNOSIS — Z8673 Personal history of transient ischemic attack (TIA), and cerebral infarction without residual deficits: Secondary | ICD-10-CM | POA: Diagnosis not present

## 2017-01-17 DIAGNOSIS — Z885 Allergy status to narcotic agent status: Secondary | ICD-10-CM | POA: Diagnosis not present

## 2017-01-17 DIAGNOSIS — E860 Dehydration: Secondary | ICD-10-CM | POA: Diagnosis present

## 2017-01-17 DIAGNOSIS — I2510 Atherosclerotic heart disease of native coronary artery without angina pectoris: Secondary | ICD-10-CM | POA: Diagnosis present

## 2017-01-17 LAB — CBC WITH DIFFERENTIAL/PLATELET
BASOS PCT: 0 %
Basophils Absolute: 0 10*3/uL (ref 0.0–0.1)
Eosinophils Absolute: 0.1 10*3/uL (ref 0.0–0.7)
Eosinophils Relative: 2 %
HEMATOCRIT: 26.6 % — AB (ref 36.0–46.0)
HEMOGLOBIN: 8.7 g/dL — AB (ref 12.0–15.0)
LYMPHS PCT: 22 %
Lymphs Abs: 1.4 10*3/uL (ref 0.7–4.0)
MCH: 26 pg (ref 26.0–34.0)
MCHC: 32.7 g/dL (ref 30.0–36.0)
MCV: 79.6 fL (ref 78.0–100.0)
MONO ABS: 0.5 10*3/uL (ref 0.1–1.0)
MONOS PCT: 7 %
NEUTROS ABS: 4.4 10*3/uL (ref 1.7–7.7)
NEUTROS PCT: 69 %
Platelets: 274 10*3/uL (ref 150–400)
RBC: 3.34 MIL/uL — ABNORMAL LOW (ref 3.87–5.11)
RDW: 18.5 % — AB (ref 11.5–15.5)
WBC: 6.5 10*3/uL (ref 4.0–10.5)

## 2017-01-17 LAB — SEDIMENTATION RATE: SED RATE: 12 mm/h (ref 0–22)

## 2017-01-17 LAB — BASIC METABOLIC PANEL
Anion gap: 5 (ref 5–15)
BUN: 11 mg/dL (ref 6–20)
CALCIUM: 8.1 mg/dL — AB (ref 8.9–10.3)
CHLORIDE: 117 mmol/L — AB (ref 101–111)
CO2: 19 mmol/L — AB (ref 22–32)
CREATININE: 0.67 mg/dL (ref 0.44–1.00)
GFR calc non Af Amer: >60 mL/min (ref >60)
Glucose, Bld: 72 mg/dL (ref 65–99)
Potassium: 2.6 mmol/L — CL (ref 3.5–5.1)
Sodium: 141 mmol/L (ref 135–145)

## 2017-01-17 LAB — TSH: TSH: 2.833 u[IU]/mL (ref 0.350–4.500)

## 2017-01-17 LAB — T4, FREE: Free T4: 1.08 ng/dL (ref 0.61–1.12)

## 2017-01-17 LAB — GLUCOSE, CAPILLARY: Glucose-Capillary: 84 mg/dL (ref 65–99)

## 2017-01-17 LAB — C-REACTIVE PROTEIN: CRP: <0.8 mg/dL (ref <1.0)

## 2017-01-17 MED ORDER — SODIUM CHLORIDE 0.9 % IV SOLN
INTRAVENOUS | Status: DC
  Administered 2017-01-17: 50 mL/h via INTRAVENOUS

## 2017-01-17 MED ORDER — ORAL CARE MOUTH RINSE: 15.0000 mL | Freq: Two times a day (BID) | OROMUCOSAL | Status: DC

## 2017-01-17 MED ORDER — POTASSIUM CHLORIDE 20 MEQ PO PACK: 40.0000 meq | PACK | ORAL | Status: DC

## 2017-01-17 MED ORDER — POTASSIUM CHLORIDE CRYS ER 20 MEQ PO TBCR
40.0000 meq | EXTENDED_RELEASE_TABLET | ORAL | Status: AC
  Administered 2017-01-17 (×2): 40 meq via ORAL
  Filled 2017-01-17: qty 2

## 2017-01-17 NOTE — Progress Notes (Addendum)
PROGRESS NOTE    Julie Stein  ZOX:[id number] DOB: 1935-06-19 DOA: 01/16/2017 PCP: Concepcion Living, NP    Brief Narrative:  82 yo female presents to the hospital with recurrent syncope. Recently diagnosed with sub clinical seizures and started on keppra. Patient deconditioned, non focal. Admitted for eeg and further observation.    Assessment & Plan:   Principal Problem:   Orthostatic syncope Active Problems:   Hypertension   Chronic systolic heart failure (HCC)   Microcytic hypochromic anemia   H/O: CVA (cerebrovascular accident)   Protein-calorie malnutrition, moderate (HCC)   Dementia   Rapid weight loss   ?? Seizure disorder (HCC)   Abulia   Dehydration with hypernatremia   1. Syncope. Multifactorial, EEG negative for active seizure, but positive for encephalopathy, will continue neuro checks q 4 hours and physical therapy evaluation. Positive orthostatics, blood pressure laying 139 and standing 106 with heart rate from 93 to 116. Will continue gentle hydration with IV fluids. Continue coreg.   2. Seizures. Continue keppra per neurology recommendations, per patient's daughter, more somnolence since taking anti-seizure agents.    3. Ambulatory dysfunction. Physical therapy evaluation.    4. Orthostatic hypotension. Will continue gentle hydration.    5. Protein calorie malnutrition. Continue megace and nutritional supplements.    6. Dementia. No agitation, patient able to follow simple commands and answer to simple questions. Continue ritalin and mirtazapine.    7, Hypokalemia. Will continue repletion with po kcl and will check mg levels, Target K of 4 and mg of 2.    DVT prophylaxis: enoxaparin  Code Status: full  Family Communication: I spoke with patient's family at the bedside and all questions were addressed.  Disposition Plan: Home    Consultants:     Procedures:    Antimicrobials:    Subjective: Patient somnolent, able to follow commands and respond  to simple questions, no nausea or vomiting. Episode of syncope while seating, brief in duration. Patient's daughter at the bedside, noted patient more somnolent since started on keppra.   Objective: Vitals:   01/16/17 1727 01/16/17 2010 01/17/17 0011 01/17/17 0441  BP: (!) 153/95 (!) 171/96 (!) 157/92 (!) 148/84  Pulse: 78 71 85 93  Resp: (!) 22 20 20 16   Temp: 97.8 F (36.6 C) 98.4 F (36.9 C) 98.7 F (37.1 C) 99.6 F (37.6 C)  TempSrc: Oral Oral Oral Oral  SpO2: 97% 100% 100% 99%  Weight: 37.3 kg (82 lb 3.7 oz)   37.1 kg (81 lb 12.7 oz)  Height: 4\' 11"  (1.499 m)      No intake or output data in the 24 hours ending 01/17/17 1342 Filed Weights   01/16/17 1727 01/17/17 0441  Weight: 37.3 kg (82 lb 3.7 oz) 37.1 kg (81 lb 12.7 oz)    Examination:  General exam: deconditioned and very weak. E ENT: positive pallor, no icterus, oral mucosa moist.  Respiratory system: Clear to auscultation. Respiratory effort normal. Cardiovascular system: S1 & S2 heard, RRR. No JVD, murmurs, rubs, gallops or clicks. No pedal edema. Gastrointestinal system: Abdomen is nondistended, soft and nontender. No organomegaly or masses felt. Normal bowel sounds heard. Central nervous system: Alert and oriented. No focal neurological deficits. Extremities: Symmetric 5 x 5 power. Skin: No rashes, lesions or ulcers .     Data Reviewed: I have personally reviewed following labs and imaging studies  CBC:  Recent Labs Lab 01/16/17 1201  WBC 5.5  NEUTROABS 4.1  HGB 9.1*  HCT 27.7*  MCV 81.2  PLT 180   Basic Metabolic Panel:  Recent Labs Lab 01/16/17 1201  NA 146*  K 3.5  CL 118*  CO2 22  GLUCOSE 87  BUN 16  CREATININE 0.81  CALCIUM 8.4*   GFR: Estimated Creatinine Clearance: 33 mL/min (by C-G formula based on SCr of 0.81 mg/dL). Liver Function Tests:  Recent Labs Lab 01/16/17 1201  AST 25  ALT 13*  ALKPHOS 53  BILITOT 0.9  PROT 5.0*  ALBUMIN 2.1*   No results for input(s):  LIPASE, AMYLASE in the last 168 hours. No results for input(s): AMMONIA in the last 168 hours. Coagulation Profile: No results for input(s): INR, PROTIME in the last 168 hours. Cardiac Enzymes:  Recent Labs Lab 01/16/17 1201  TROPONINI <0.03   BNP (last 3 results) No results for input(s): PROBNP in the last 8760 hours. HbA1C: No results for input(s): HGBA1C in the last 72 hours. CBG:  Recent Labs Lab 01/17/17 0558  GLUCAP 84   Lipid Profile: No results for input(s): CHOL, HDL, LDLCALC, TRIG, CHOLHDL, LDLDIRECT in the last 72 hours. Thyroid Function Tests: No results for input(s): TSH, T4TOTAL, FREET4, T3FREE, THYROIDAB in the last 72 hours. Anemia Panel: No results for input(s): VITAMINB12, FOLATE, FERRITIN, TIBC, IRON, RETICCTPCT in the last 72 hours. Sepsis Labs: No results for input(s): PROCALCITON, LATICACIDVEN in the last 168 hours.  No results found for this or any previous visit (from the past 240 hour(s)).       Radiology Studies: No results found.      Scheduled Meds: . aspirin EC  81 mg Oral Daily  . carvedilol  6.25 mg Oral BID WC  . enoxaparin (LOVENOX) injection  30 mg Subcutaneous Q24H  . feeding supplement  1 Container Oral BID WC  . feeding supplement (ENSURE ENLIVE)  237 mL Oral BID BM  . levETIRAcetam  500 mg Oral BID  . mouth rinse  15 mL Mouth Rinse BID  . megestrol  200-400 mg Oral Daily  . methylphenidate  5 mg Oral BID WC  . mirtazapine  15 mg Oral QHS  . multivitamin  15 mL Oral Daily  . nutrition supplement (JUVEN)  1 packet Oral BID PC  . sodium chloride flush  3 mL Intravenous Q12H   Continuous Infusions: . sodium chloride Stopped (01/16/17 1649)     LOS: 0 days       Rakesh Dutko Annett Gula, MD [facility] Pager [phone]  If 7PM-7AM, please contact night-coverage [email or web address] Password [email or web address] 01/17/2017, 1:42 PM

## 2017-01-17 NOTE — Progress Notes (Signed)
Family at bedside.  Family questioning need for in/out cath as Pt had this test last week while hospitalized and results were negative.  Asked family if they would like this nurse to wait and have family discuss with attending.  Pt family would like to discuss need for this.  Family also has list of tests recommended by family's neurologist.  Listed these tests under physician sticky note for review.

## 2017-01-18 DIAGNOSIS — E876 Hypokalemia: Secondary | ICD-10-CM

## 2017-01-18 LAB — BASIC METABOLIC PANEL
Anion gap: 3 — ABNORMAL LOW (ref 5–15)
Anion gap: 3 — ABNORMAL LOW (ref 5–15)
BUN: 14 mg/dL (ref 6–20)
BUN: 16 mg/dL (ref 6–20)
CALCIUM: 8.1 mg/dL — AB (ref 8.9–10.3)
CO2: 22 mmol/L (ref 22–32)
CO2: 19 mmol/L — AB (ref 22–32)
CREATININE: 0.69 mg/dL (ref 0.44–1.00)
CREATININE: 0.63 mg/dL (ref 0.44–1.00)
Calcium: 8 mg/dL — ABNORMAL LOW (ref 8.9–10.3)
Chloride: 118 mmol/L — ABNORMAL HIGH (ref 101–111)
Chloride: 122 mmol/L — ABNORMAL HIGH (ref 101–111)
GFR calc Af Amer: >60 mL/min (ref >60)
GFR calc Af Amer: >60 mL/min (ref >60)
GFR calc non Af Amer: >60 mL/min (ref >60)
GFR calc non Af Amer: >60 mL/min (ref >60)
GLUCOSE: 92 mg/dL (ref 65–99)
Glucose, Bld: 83 mg/dL (ref 65–99)
POTASSIUM: 2.5 mmol/L — AB (ref 3.5–5.1)
Potassium: 4.4 mmol/L (ref 3.5–5.1)
SODIUM: 143 mmol/L (ref 135–145)
Sodium: 144 mmol/L (ref 135–145)

## 2017-01-18 LAB — GLUCOSE, CAPILLARY: Glucose-Capillary: 80 mg/dL (ref 65–99)

## 2017-01-18 LAB — MAGNESIUM: MAGNESIUM: 1.8 mg/dL (ref 1.7–2.4)

## 2017-01-18 MED ORDER — LEVETIRACETAM 100 MG/ML PO SOLN: 250.0000 mg | Freq: Two times a day (BID) | ORAL | 0 refills | Status: DC

## 2017-01-18 MED ORDER — LEVETIRACETAM 100 MG/ML PO SOLN
250.0000 mg | Freq: Two times a day (BID) | ORAL | Status: DC
  Administered 2017-01-18: 250 mg via ORAL
  Filled 2017-01-18 (×2): qty 2.5

## 2017-01-18 MED ORDER — POTASSIUM CHLORIDE 2 MEQ/ML IV SOLN
Freq: Once | INTRAVENOUS | Status: AC
  Administered 2017-01-18: 05:00:00 via INTRAVENOUS
  Filled 2017-01-18: qty 1000

## 2017-01-18 NOTE — Progress Notes (Signed)
Critical value received from lab.  K+ 2.5.  On call notified.  Order received.  Will cont to monitor.

## 2017-01-18 NOTE — Progress Notes (Signed)
Went over discharge instructions with patients daughter. Daughter verbalized that patient has all medications at home.

## 2017-01-18 NOTE — Progress Notes (Addendum)
Spoke with daughter.  Per daughter, family does not want Pt taking Keppra any longer because they believe it is making Pt more sedated.  Held Keppra.  Also continuing to hold Remeron until family can discuss if they wish to try this medication.  Provided education on Remeron and its therapeutic qualities.  Daughter indicates understanding and will discuss with other daughter.  Will continue to support family and Pt.

## 2017-01-18 NOTE — Discharge Summary (Signed)
Physician Discharge Summary  Julie Stein ZOX:[id number] DOB: 02/01/1935 DOA: 01/16/2017  PCP: Concepcion Living, NP  Admit date: 01/16/2017 Discharge date: 01/18/2017  Admitted From: Home  Disposition: Home     Recommendations for Outpatient Follow-up:  1. Follow up with PCP in 1-week 2. Keppra has been reduced to 250 mg bid.   Home Health: Yes (resume) Equipment/Devices: No   Discharge Condition: Stable  CODE STATUS: full  Diet recommendation: Heart Healthy   Brief/Interim Summary: This is a 82 year old female who presented to hospital with a chief complaint of syncope episode. Recent discharge February 23rd for syncope, it was concluded that loss of consciousness was related to orthostasis and poor oral intake. February 26 had a follow-up appointment with neurology Dr. Terrace Arabia. Patient has been on Keppra 500 mg twice daily for a suspected subclinical seizures. Apparently patient was sitting having breakfast when she suddenly lost her consciousness for about 2 minutes. Since the start of Keppra patient has been noticed to be more somnolent and less reactive. On initial physical examination patient was awake and alert, blood pressure 143/83, heart rate 77, respiratory rate 15, oxygen saturation 100%. Oral mucous membranes were moist, her lungs were clear to auscultation bilaterally, no wheezing, rales or rhonchi, heart S1-S2 present rhythmic, her abdomen was soft, lower extremities no edema, neurologically patient was nonfocal. Sodium 146, potassium 3.5, chloride 118, bicarbonate 22, glucose 87, BUN 16, creatinine 0.81, troponin less than 0.03, white count 5.5, hemoglobin 9.1, hematocrit 27.7, platelets 180, TSH 2.8. EKG was normal sinus rhythm, normal intervals, poor R wave progression. No ST elevations or ST depressions, no significant T wave abnormalities.  The patient was admitted to hospital with a working diagnosis of syncope likely multifactorial, rule out seizure, medication induced or  orthostatic.  1. Syncope. Patient was admitted to the medical floor, placed on a telemetry monitor, no arrhythmias documented, patient had no further syncope episodes. Continue on Coreg and aspirin. Patient received IV fluids, with isotonic saline. Patient had positive orthostatics, systolic blood pressure dropped from 139 laying to106  standing, heart rate went up from 93 to 116.  2. Subclinical seizures. Family has noticed patient being more somnolent and less reactive on Keppra, discussed this with the on-call neurologist over the phone, will decrease Keppra to 250 mg BID and will advised to follow-up with outpatient neurology Dr. Terrace Arabia.   3. Hypokalemia. Follow-up potassium down to 2.6 and 2.5, patient received aggressive potassium supplementation, with discharge K at 4,4. Will resume Aldactone by the time of discharge.  4. Hypertension. Continue Aldactone, Coreg. Discharge blood pressure was systolic 159-170. Considering her history of orthostasis and syncope will recommend keep blood pressure systolic between 811-914.   5. Ambulatory dysfunction. Patient will continue physical therapy.  6. Calorie protein malnutrition. Patient was continued on many days and nutritional supplements.  7. Dementia. No agitation, patient able to follow commands and responds simple questions, continue ritalin and mirtazapine.   Discharge Diagnoses:  Principal Problem:   Orthostatic syncope Active Problems:   Hypertension   Chronic systolic heart failure (HCC)   Microcytic hypochromic anemia   H/O: CVA (cerebrovascular accident)   Protein-calorie malnutrition, moderate (HCC)   Dementia   Rapid weight loss   ?? Seizure disorder (HCC)   Abulia   Dehydration with hypernatremia    Discharge Instructions   Allergies as of 01/18/2017      Reactions   Demerol [meperidine] Other (See Comments)   Hallucinations, out of body experience      Medication List  STOP taking these medications    multivitamin Liqd   nitroGLYCERIN 0.4 MG SL tablet Commonly known as:  NITROSTAT     TAKE these medications   aspirin 81 MG EC tablet Take 1 tablet (81 mg total) by mouth daily.   bisacodyl 5 MG EC tablet Commonly known as:  DULCOLAX Take 1 tablet (5 mg total) by mouth daily as needed for moderate constipation.   carvedilol 6.25 MG tablet Commonly known as:  COREG Take 1 tablet (6.25 mg total) by mouth 2 (two) times daily with a meal.   feeding supplement Liqd Take 1 Container by mouth 2 (two) times daily with a meal.   feeding supplement (ENSURE ENLIVE) Liqd Take 237 mLs by mouth 2 (two) times daily between meals.   nutrition supplement (JUVEN) Pack Take 1 packet by mouth 2 (two) times daily after a meal.   flintstones complete 60 MG chewable tablet Chew 2 tablets by mouth daily.   isosorbide dinitrate 20 MG tablet Commonly known as:  ISORDIL Take 20 mg by mouth 3 (three) times daily.   levETIRAcetam 100 MG/ML solution Commonly known as:  KEPPRA Take 2.5 mLs (250 mg total) by mouth 2 (two) times daily. What changed:  how much to take   megestrol 400 MG/10ML suspension Commonly known as:  MEGACE Take 10 mLs (400 mg total) by mouth daily. What changed:  how much to take   methylphenidate 5 MG tablet Commonly known as:  RITALIN Take 1 tablet (5 mg total) by mouth 2 (two) times daily with breakfast and lunch. What changed:  how much to take  when to take this   mirtazapine 15 MG disintegrating tablet Commonly known as:  REMERON SOL-TAB Take 1 tablet (15 mg total) by mouth at bedtime.   spironolactone 25 MG tablet Commonly known as:  ALDACTONE Take 25 mg by mouth daily.       Allergies  Allergen Reactions  . Demerol [Meperidine] Other (See Comments)    Hallucinations, out of body experience    Consultations:  Neurology: over the phone consultation.    Procedures/Studies: Dg Chest 2 View  Result Date: 01/06/2017 CLINICAL DATA:  82 year old  female without chest pain. Confusion. Initial encounter. EXAM: CHEST  2 VIEW COMPARISON:  08/31/2015. FINDINGS: No infiltrate, congestive heart failure or pneumothorax. Blunting posterior sulci suggestive of small pleural effusions/pleural thickening. No plain film evidence of pulmonary malignancy. Scoliosis with distortion of the mediastinal and cardiac silhouette. Heart size within normal limits. Calcified tortuous aorta. Bones are osteopenic. IMPRESSION: Blunting posterior sulci may represent presence of small pleural effusions or pleural thickening. No segmental infiltrate or congestive heart failure. Aortic atherosclerosis. Electronically Signed   By: Lacy Duverney M.D.   On: 01/06/2017 14:12   Ct Head Wo Contrast  Result Date: 01/06/2017 CLINICAL DATA:  82 year old female syncope with fall and confusion. Initial encounter. EXAM: CT HEAD WITHOUT CONTRAST TECHNIQUE: Contiguous axial images were obtained from the base of the skull through the vertex without intravenous contrast. COMPARISON:  08/28/2015 MR and 08/27/2015 CT. FINDINGS: Brain: No intracranial hemorrhage. Chronic microvascular changes without CT evidence of large acute infarct. Mild global atrophy without hydrocephalus. No intracranial mass lesion noted on this unenhanced exam. Vascular: Vascular calcifications. Skull: No skull fracture. Sinuses/Orbits: No acute orbital abnormality. Partial opacification right mastoid air cells without obstructing lesion of eustachian tube noted. Minimal mucosal thickening right maxillary sinus. Other: Negative IMPRESSION: No skull fracture or intracranial hemorrhage. Chronic microvascular changes without CT evidence of large acute infarct. Partial  opacification right mastoid air cells. Minimal mucosal thickening right maxillary sinus. Electronically Signed   By: Lacy Duverney M.D.   On: 01/06/2017 14:28   Mri Brain Without Contrast  Result Date: 01/06/2017 CLINICAL DATA:  Syncope.  Hypertension. EXAM: MRI  HEAD WITHOUT CONTRAST TECHNIQUE: Multiplanar, multiecho pulse sequences of the brain and surrounding structures were obtained without intravenous contrast. COMPARISON:  CT head 01/06/2017.  MRI 08/28/2015 FINDINGS: Brain: Negative for acute infarct. Atrophy with chronic microvascular ischemic change throughout the white matter. Chronic infarct in the anterior corpus callosum. Small chronic infarct in the right medial parietal lobe. Multiple areas of chronic microhemorrhage throughout the brain consistent with the history of hypertension. These were present previously. No mass or fluid collection. No shift of the midline structures. Vascular: Normal arterial flow voids. Skull and upper cervical spine: Negative Sinuses/Orbits: Mucosal edema paranasal sinuses. Bilateral mastoid sinus effusion. Negative orbit. Other: None IMPRESSION: No acute intracranial abnormality Atrophy with moderate chronic microvascular ischemia. Multiple areas of chronic microhemorrhage in the brain consistent with the history of hypertension. Electronically Signed   By: Marlan Palau M.D.   On: 01/06/2017 20:07   Mr Cervical Spine Wo Contrast  Result Date: 01/09/2017 CLINICAL DATA:  Leg weakness and difficulty walking EXAM: MRI CERVICAL, THORACIC AND LUMBAR SPINE WITHOUT CONTRAST TECHNIQUE: Multiplanar and multiecho pulse sequences of the cervical spine, to include the craniocervical junction and cervicothoracic junction, and thoracic and lumbar spine, were obtained without intravenous contrast. COMPARISON:  None. FINDINGS: MRI CERVICAL SPINE FINDINGS Alignment: Normal Vertebrae: No acute compression fracture, discitis-osteomyelitis, facet edema or other focal marrow lesion. No epidural collection. Cord: Normal signal and caliber Posterior Fossa, vertebral arteries, paraspinal tissues: Unremarkable Disc levels: C1-C2: Normal. C2-C3: Normal disc space and facets. No spinal canal or neuroforaminal stenosis. C3-C4: Small disc bulge without  spinal canal stenosis. Mild right foraminal narrowing. C4-C5: Normal disc space and facets. No spinal canal or neuroforaminal stenosis. C5-C6: Small disc osteophyte complex with moderate bilateral foraminal narrowing. No spinal canal stenosis. C6-C7: Small disc osteophyte complex. No spinal canal stenosis. Moderate bilateral foraminal narrowing. C7-T1: Normal disc space and facets. No spinal canal or neuroforaminal stenosis. MRI THORACIC SPINE FINDINGS Alignment:  Normal Vertebrae: There is a small hyperintense T2 weighted signal lesion within the T6 vertebral body with associated low T1 weighted signal. Otherwise, the bone marrow is normal. No acute compression fracture. No discitis osteomyelitis. Cord:  Normal signal and caliber Paraspinal and other soft tissues: Small right pleural effusion Disc levels: No spinal canal or neural foraminal stenosis. MRI LUMBAR SPINE FINDINGS Segmentation:  Normal Alignment:  Normal Vertebrae: Low T1/ high T2 weighted signal lesion in the L2 vertebral body. Otherwise, the marrow signal is normal. Conus medullaris: Extends to the L1-L2 level and appears normal. Paraspinal and other soft tissues: There are left renal cysts. Otherwise unremarkable. Disc levels: T12-L1: Normal disc space and facets. No spinal canal or neuroforaminal stenosis. L1-L2: Normal disc space and facets. No spinal canal or neuroforaminal stenosis. L2-L3: Normal disc space and facets. No spinal canal or neuroforaminal stenosis. L3-L4: Minimal disc bulge. No spinal canal or neural foraminal stenosis. L4-L5: Minimal disc bulge without spinal canal or neural foraminal stenosis. L5-S1: Small disc bulge without spinal canal stenosis. Mild narrowing of both lateral recesses. No neural foraminal stenosis. IMPRESSION: 1. No cord compression or severe neural impingement. 2. Low T1 weighted signal marrow lesions within the T6 and L2 vertebral bodies, indeterminate. In the absence of any known primary malignancy, these  most likely are  fat-poor hemangiomas. A neoplastic lesion would be difficult to exclude, however. Further assessment with IV contrast may be helpful. 3. Multilevel moderate foraminal narrowing within the cervical spine, greatest at C5-C6 and C6-C7. 4. Mild bilateral lateral recess narrowing at L5-S1. Correlate for S1 radiculopathy. 5. Small right pleural effusion. Electronically Signed   By: Deatra Robinson M.D.   On: 01/09/2017 01:57   Mr Thoracic Spine Wo Contrast  Result Date: 01/09/2017 CLINICAL DATA:  Leg weakness and difficulty walking EXAM: MRI CERVICAL, THORACIC AND LUMBAR SPINE WITHOUT CONTRAST TECHNIQUE: Multiplanar and multiecho pulse sequences of the cervical spine, to include the craniocervical junction and cervicothoracic junction, and thoracic and lumbar spine, were obtained without intravenous contrast. COMPARISON:  None. FINDINGS: MRI CERVICAL SPINE FINDINGS Alignment: Normal Vertebrae: No acute compression fracture, discitis-osteomyelitis, facet edema or other focal marrow lesion. No epidural collection. Cord: Normal signal and caliber Posterior Fossa, vertebral arteries, paraspinal tissues: Unremarkable Disc levels: C1-C2: Normal. C2-C3: Normal disc space and facets. No spinal canal or neuroforaminal stenosis. C3-C4: Small disc bulge without spinal canal stenosis. Mild right foraminal narrowing. C4-C5: Normal disc space and facets. No spinal canal or neuroforaminal stenosis. C5-C6: Small disc osteophyte complex with moderate bilateral foraminal narrowing. No spinal canal stenosis. C6-C7: Small disc osteophyte complex. No spinal canal stenosis. Moderate bilateral foraminal narrowing. C7-T1: Normal disc space and facets. No spinal canal or neuroforaminal stenosis. MRI THORACIC SPINE FINDINGS Alignment:  Normal Vertebrae: There is a small hyperintense T2 weighted signal lesion within the T6 vertebral body with associated low T1 weighted signal. Otherwise, the bone marrow is normal. No acute  compression fracture. No discitis osteomyelitis. Cord:  Normal signal and caliber Paraspinal and other soft tissues: Small right pleural effusion Disc levels: No spinal canal or neural foraminal stenosis. MRI LUMBAR SPINE FINDINGS Segmentation:  Normal Alignment:  Normal Vertebrae: Low T1/ high T2 weighted signal lesion in the L2 vertebral body. Otherwise, the marrow signal is normal. Conus medullaris: Extends to the L1-L2 level and appears normal. Paraspinal and other soft tissues: There are left renal cysts. Otherwise unremarkable. Disc levels: T12-L1: Normal disc space and facets. No spinal canal or neuroforaminal stenosis. L1-L2: Normal disc space and facets. No spinal canal or neuroforaminal stenosis. L2-L3: Normal disc space and facets. No spinal canal or neuroforaminal stenosis. L3-L4: Minimal disc bulge. No spinal canal or neural foraminal stenosis. L4-L5: Minimal disc bulge without spinal canal or neural foraminal stenosis. L5-S1: Small disc bulge without spinal canal stenosis. Mild narrowing of both lateral recesses. No neural foraminal stenosis. IMPRESSION: 1. No cord compression or severe neural impingement. 2. Low T1 weighted signal marrow lesions within the T6 and L2 vertebral bodies, indeterminate. In the absence of any known primary malignancy, these most likely are fat-poor hemangiomas. A neoplastic lesion would be difficult to exclude, however. Further assessment with IV contrast may be helpful. 3. Multilevel moderate foraminal narrowing within the cervical spine, greatest at C5-C6 and C6-C7. 4. Mild bilateral lateral recess narrowing at L5-S1. Correlate for S1 radiculopathy. 5. Small right pleural effusion. Electronically Signed   By: Deatra Robinson M.D.   On: 01/09/2017 01:57   Mr Lumbar Spine Wo Contrast  Result Date: 01/09/2017 CLINICAL DATA:  Leg weakness and difficulty walking EXAM: MRI CERVICAL, THORACIC AND LUMBAR SPINE WITHOUT CONTRAST TECHNIQUE: Multiplanar and multiecho pulse  sequences of the cervical spine, to include the craniocervical junction and cervicothoracic junction, and thoracic and lumbar spine, were obtained without intravenous contrast. COMPARISON:  None. FINDINGS: MRI CERVICAL SPINE FINDINGS Alignment: Normal Vertebrae:  No acute compression fracture, discitis-osteomyelitis, facet edema or other focal marrow lesion. No epidural collection. Cord: Normal signal and caliber Posterior Fossa, vertebral arteries, paraspinal tissues: Unremarkable Disc levels: C1-C2: Normal. C2-C3: Normal disc space and facets. No spinal canal or neuroforaminal stenosis. C3-C4: Small disc bulge without spinal canal stenosis. Mild right foraminal narrowing. C4-C5: Normal disc space and facets. No spinal canal or neuroforaminal stenosis. C5-C6: Small disc osteophyte complex with moderate bilateral foraminal narrowing. No spinal canal stenosis. C6-C7: Small disc osteophyte complex. No spinal canal stenosis. Moderate bilateral foraminal narrowing. C7-T1: Normal disc space and facets. No spinal canal or neuroforaminal stenosis. MRI THORACIC SPINE FINDINGS Alignment:  Normal Vertebrae: There is a small hyperintense T2 weighted signal lesion within the T6 vertebral body with associated low T1 weighted signal. Otherwise, the bone marrow is normal. No acute compression fracture. No discitis osteomyelitis. Cord:  Normal signal and caliber Paraspinal and other soft tissues: Small right pleural effusion Disc levels: No spinal canal or neural foraminal stenosis. MRI LUMBAR SPINE FINDINGS Segmentation:  Normal Alignment:  Normal Vertebrae: Low T1/ high T2 weighted signal lesion in the L2 vertebral body. Otherwise, the marrow signal is normal. Conus medullaris: Extends to the L1-L2 level and appears normal. Paraspinal and other soft tissues: There are left renal cysts. Otherwise unremarkable. Disc levels: T12-L1: Normal disc space and facets. No spinal canal or neuroforaminal stenosis. L1-L2: Normal disc space and  facets. No spinal canal or neuroforaminal stenosis. L2-L3: Normal disc space and facets. No spinal canal or neuroforaminal stenosis. L3-L4: Minimal disc bulge. No spinal canal or neural foraminal stenosis. L4-L5: Minimal disc bulge without spinal canal or neural foraminal stenosis. L5-S1: Small disc bulge without spinal canal stenosis. Mild narrowing of both lateral recesses. No neural foraminal stenosis. IMPRESSION: 1. No cord compression or severe neural impingement. 2. Low T1 weighted signal marrow lesions within the T6 and L2 vertebral bodies, indeterminate. In the absence of any known primary malignancy, these most likely are fat-poor hemangiomas. A neoplastic lesion would be difficult to exclude, however. Further assessment with IV contrast may be helpful. 3. Multilevel moderate foraminal narrowing within the cervical spine, greatest at C5-C6 and C6-C7. 4. Mild bilateral lateral recess narrowing at L5-S1. Correlate for S1 radiculopathy. 5. Small right pleural effusion. Electronically Signed   By: Deatra Robinson M.D.   On: 01/09/2017 01:57       Subjective: Patient feeling well, back to baseline, no nausea or vomiting. No chest pain.   Discharge Exam: Vitals:   01/18/17 0602 01/18/17 1046  BP: (!) 159/97 (!) 176/86  Pulse: 84 88  Resp: 18   Temp: 99.5 F (37.5 C)    Vitals:   01/17/17 1428 01/17/17 2058 01/18/17 0602 01/18/17 1046  BP: (!) 144/82 140/83 (!) 159/97 (!) 176/86  Pulse: 87 100 84 88  Resp: 18 18 18    Temp:  99.8 F (37.7 C) 99.5 F (37.5 C)   TempSrc:  Oral Oral   SpO2: 100% 98% 98% 100%  Weight:   38.8 kg (85 lb 8 oz)   Height:        General: Pt is alert, awake, not in acute distress, deconditioned. Positive pallor.  Cardiovascular: RRR, S1/S2 +, no rubs, no gallops Respiratory: CTA bilaterally, no wheezing, no rhonchi Abdominal: Soft, NT, ND, bowel sounds + Extremities: no edema, no cyanosis    The results of significant diagnostics from this  hospitalization (including imaging, microbiology, ancillary and laboratory) are listed below for reference.     Microbiology: No  results found for this or any previous visit (from the past 240 hour(s)).   Labs: BNP (last 3 results) No results for input(s): BNP in the last 8760 hours. Basic Metabolic Panel:  Recent Labs Lab 01/16/17 1201 01/17/17 1414 01/18/17 0258  NA 146* 141 143  K 3.5 2.6* 2.5*  CL 118* 117* 118*  CO2 22 19* 22  GLUCOSE 87 72 83  BUN 16 11 14   CREATININE 0.81 0.67 1.61  CALCIUM 8.4* 8.1* 8.0*  MG  --   --  1.8   Liver Function Tests:  Recent Labs Lab 01/16/17 1201  AST 25  ALT 13*  ALKPHOS 53  BILITOT 0.9  PROT 5.0*  ALBUMIN 2.1*   No results for input(s): LIPASE, AMYLASE in the last 168 hours. No results for input(s): AMMONIA in the last 168 hours. CBC:  Recent Labs Lab 01/16/17 1201 01/17/17 1414  WBC 5.5 6.5  NEUTROABS 4.1 4.4  HGB 9.1* 8.7*  HCT 27.7* 26.6*  MCV 81.2 79.6  PLT 180 274   Cardiac Enzymes:  Recent Labs Lab 01/16/17 1201  TROPONINI <0.03   BNP: Invalid input(s): POCBNP CBG:  Recent Labs Lab 01/17/17 0558 01/18/17 0601  GLUCAP 84 80   D-Dimer No results for input(s): DDIMER in the last 72 hours. Hgb A1c No results for input(s): HGBA1C in the last 72 hours. Lipid Profile No results for input(s): CHOL, HDL, LDLCALC, TRIG, CHOLHDL, LDLDIRECT in the last 72 hours. Thyroid function studies  Recent Labs  01/17/17 1414  TSH 2.833   Anemia work up No results for input(s): VITAMINB12, FOLATE, FERRITIN, TIBC, IRON, RETICCTPCT in the last 72 hours. Urinalysis    Component Value Date/Time   COLORURINE YELLOW 01/06/2017 1448   APPEARANCEUR HAZY (A) 01/06/2017 1448   LABSPEC 1.021 01/06/2017 1448   LABSPEC 1.010 11/26/2016 1301   PHURINE 6.0 01/06/2017 1448   GLUCOSEU NEGATIVE 01/06/2017 1448   GLUCOSEU Negative 11/26/2016 1301   HGBUR NEGATIVE 01/06/2017 1448   BILIRUBINUR NEGATIVE 01/06/2017 1448    BILIRUBINUR Negative 11/26/2016 1301   KETONESUR NEGATIVE 01/06/2017 1448   PROTEINUR 30 (A) 01/06/2017 1448   UROBILINOGEN 0.2 11/26/2016 1301   NITRITE NEGATIVE 01/06/2017 1448   LEUKOCYTESUR NEGATIVE 01/06/2017 1448   LEUKOCYTESUR Trace 11/26/2016 1301   Sepsis Labs Invalid input(s): PROCALCITONIN,  WBC,  LACTICIDVEN Microbiology No results found for this or any previous visit (from the past 240 hour(s)).   Time coordinating discharge: 45 minutes  SIGNED:   Coralie Keens, MD  [facility] 01/18/2017, 12:09 PM Pager   If 7PM-7AM, please contact night-coverage [email or web address] Password [email or web address]

## 2017-01-18 NOTE — Progress Notes (Signed)
PT Cancellation Note  Patient Details Name: Julie Stein MRN: [id number] DOB: 06-09-1935   Cancelled Treatment:    Reason Eval/Treat Not Completed: Medical issues which prohibited therapy (Pt with critical K of 2.5 and not appropriate at this time)   [name] B [name] 01/18/2017, 7:38 AM [name], PT [phone]

## 2017-01-19 LAB — ANA W/REFLEX IF POSITIVE: ANA: NEGATIVE

## 2017-01-20 ENCOUNTER — Other Ambulatory Visit: Payer: Medicare Other

## 2017-01-20 ENCOUNTER — Ambulatory Visit: Payer: Medicare Other

## 2017-01-20 ENCOUNTER — Ambulatory Visit: Payer: Medicare Other | Admitting: Hematology and Oncology

## 2017-01-21 ENCOUNTER — Telehealth: Payer: Self-pay | Admitting: Neurology

## 2017-01-21 ENCOUNTER — Other Ambulatory Visit: Payer: Medicare Other

## 2017-01-21 ENCOUNTER — Other Ambulatory Visit: Payer: Self-pay | Admitting: Family Medicine

## 2017-01-21 NOTE — Telephone Encounter (Signed)
Patients daughter Marijo Conception (listed on DPR) called office in reference to EEG appointment per daughter patient just had EEG done in hospital on 01/16/17.  Please call

## 2017-01-21 NOTE — Telephone Encounter (Signed)
Patient had labs and repeat EEG at hospital on 01/16/17.  EEG here has been canceled.  They will keep her pending follow up appointment.

## 2017-01-21 NOTE — Telephone Encounter (Signed)
Patient refill.

## 2017-01-22 ENCOUNTER — Other Ambulatory Visit: Payer: Medicare Other

## 2017-01-23 ENCOUNTER — Telehealth: Payer: Self-pay | Admitting: Neurology

## 2017-01-23 MED FILL — POTASSIUM CL 20% (40 MEQ/15: 40 MEQ/15ML | 30 days supply | Qty: 225 | Fill #0

## 2017-01-23 NOTE — Telephone Encounter (Signed)
I re-entered the order and diagnosis code

## 2017-01-23 NOTE — Telephone Encounter (Signed)
Julie Stein with [facility] called and stated that known of the diagnostic codes match the reasoning's of the CT's.. She also state's that the CT Pelvis needs to be changed to with contrast.

## 2017-01-23 NOTE — Addendum Note (Signed)
Addended by: Levert Feinstein on: 01/23/2017 12:02 PM   Modules accepted: Orders

## 2017-01-26 ENCOUNTER — Telehealth: Payer: Self-pay

## 2017-01-26 NOTE — Telephone Encounter (Signed)
Amber with [facility], who is Physical therapist called and left message. Did evaluation on Friday with patient. Needs verbal order for trial for 1 week to see if patient can participate in therapy.

## 2017-01-26 NOTE — Telephone Encounter (Signed)
OK to authorize treatment

## 2017-01-26 NOTE — Telephone Encounter (Signed)
Equities trader with [facility] and given verbal order for Physical therapy for 1 week per previous note.

## 2017-01-27 ENCOUNTER — Telehealth: Payer: Self-pay

## 2017-01-27 NOTE — Telephone Encounter (Signed)
Noted, thank you I let Olegario Messier know yesterday and she is aware

## 2017-01-27 NOTE — Telephone Encounter (Signed)
Returned daughter call, she left a message about getting a referral for CNA from home health at [facility]..  Left message.

## 2017-01-28 MED FILL — METHYLPHENIDATE 10 MG TAB: 10 | 30 days supply | Qty: 60 | Fill #0

## 2017-01-29 MED FILL — MEGESTROL ACET 40 MG/ML SUS: 40 | 34 days supply | Qty: 340 | Fill #0

## 2017-01-30 ENCOUNTER — Encounter (HOSPITAL_COMMUNITY): Payer: Medicare Other

## 2017-02-03 ENCOUNTER — Telehealth: Payer: Self-pay | Admitting: *Deleted

## 2017-02-03 NOTE — Telephone Encounter (Signed)
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  We were just told today this patient refused Hospice services.  We've discontinued thinking she was admitted to Hospice.  If Dr. Bertis Ruddy would like Home Health to continue, we'll need new orders.  Please call me at [phone]."

## 2017-02-04 MED FILL — SPIRONOLACTONE 25 MG TABLET: 25 | 30 days supply | Qty: 30 | Fill #1 | Status: TO

## 2017-02-05 ENCOUNTER — Ambulatory Visit: Payer: Medicare Other | Admitting: Neurology

## 2017-02-11 ENCOUNTER — Encounter (HOSPITAL_COMMUNITY): Payer: Medicare Other

## 2017-02-11 MED FILL — MIRTAZAPINE 15 MG TABLET: 15 | 30 days supply | Qty: 30 | Fill #0

## 2017-02-17 ENCOUNTER — Other Ambulatory Visit: Payer: Medicare Other

## 2017-02-17 ENCOUNTER — Ambulatory Visit: Payer: Medicare Other

## 2017-02-23 ENCOUNTER — Telehealth: Payer: Self-pay | Admitting: Neurology

## 2017-02-23 ENCOUNTER — Ambulatory Visit: Payer: Medicare Other | Admitting: Neurology

## 2017-02-23 NOTE — Telephone Encounter (Signed)
[name] with [facility] just called and was asking if Julie Stein should have a CT chest w contrast instead of a CT chest wo contrast. Because she said they can run all the exams at once with the CT Pelvis w contrast. [name] is scheduled to have these exams done Tuesday 02/24/17.. I just need to know if you would like to switch it or not so I can let [name] know.

## 2017-02-23 NOTE — Telephone Encounter (Signed)
Noted, Thank you I will let [name] know!

## 2017-02-23 NOTE — Addendum Note (Signed)
Addended by: Levert Feinstein on: 02/23/2017 03:25 PM   Modules accepted: Orders

## 2017-02-23 NOTE — Telephone Encounter (Signed)
Normal creatinine, 0.63, it is okay to change to CT chest with contrast, order was placed.

## 2017-02-24 ENCOUNTER — Other Ambulatory Visit: Payer: Medicare Other

## 2017-02-24 ENCOUNTER — Inpatient Hospital Stay: Admission: RE | Admit: 2017-02-24 | Payer: Medicare Other | Source: Ambulatory Visit

## 2017-03-04 ENCOUNTER — Other Ambulatory Visit: Payer: Medicare Other

## 2017-03-10 ENCOUNTER — Ambulatory Visit: Payer: Medicare Other | Admitting: Neurology

## 2017-03-11 ENCOUNTER — Other Ambulatory Visit: Payer: Medicare Other

## 2017-03-20 MED FILL — CARVEDILOL 6.25 MG TABLET: 6.25 | 30 days supply | Qty: 60 | Fill #0

## 2017-03-20 MED FILL — SPIRONOLACTONE 25 MG TABLET: 25 | 30 days supply | Qty: 30 | Fill #0

## 2017-03-20 MED FILL — POTASSIUM CL 20% (40 MEQ/15: 40 MEQ/15ML | 30 days supply | Qty: 225 | Fill #0

## 2017-03-23 MED FILL — MIRTAZAPINE 15 MG TABLET: 15 | 30 days supply | Qty: 30 | Fill #1

## 2017-03-26 ENCOUNTER — Ambulatory Visit: Payer: Medicare Other | Admitting: Neurology

## 2017-05-12 MED FILL — POTASSIUM CL 20% (40 MEQ/15: 40 MEQ/15ML | 30 days supply | Qty: 225 | Fill #1

## 2017-05-12 MED FILL — CARVEDILOL 6.25 MG TABLET: 6.25 | 30 days supply | Qty: 60 | Fill #1

## 2017-05-12 MED FILL — LEVETIRACETAM 100 MG/ML SOL: 100 | 90 days supply | Qty: 473 | Fill #0

## 2017-05-12 MED FILL — SPIRONOLACTONE 25 MG TABLET: 25 | 30 days supply | Qty: 30 | Fill #1

## 2017-05-12 MED FILL — MIRTAZAPINE 15 MG TABLET: 15 | 30 days supply | Qty: 30 | Fill #2

## 2017-06-12 MED FILL — MIRTAZAPINE 15 MG TABLET: 15 | 30 days supply | Qty: 30 | Fill #3

## 2017-06-12 MED FILL — SPIRONOLACTONE 25 MG TABLET: 25 | 30 days supply | Qty: 30 | Fill #2

## 2017-06-12 MED FILL — POTASSIUM CL 20% (40 MEQ/15: 40 MEQ/15ML | 30 days supply | Qty: 225 | Fill #2

## 2017-06-12 MED FILL — CARVEDILOL 6.25 MG TABLET: 6.25 | 30 days supply | Qty: 60 | Fill #2

## 2017-07-14 MED FILL — SPIRONOLACTONE 25 MG TABLET: 25 | 30 days supply | Qty: 30 | Fill #3

## 2017-07-27 MED FILL — POTASSIUM CL 20% (40 MEQ/15: 40 MEQ/15ML | 30 days supply | Qty: 225 | Fill #3

## 2017-07-29 ENCOUNTER — Ambulatory Visit (HOSPITAL_COMMUNITY)
Admission: RE | Admit: 2017-07-29 | Discharge: 2017-07-29 | Disposition: A | Discharge status: Home / Self Care | Payer: Medicaid Other | Source: Ambulatory Visit | Attending: Cardiology | Admitting: Cardiology

## 2017-07-29 ENCOUNTER — Encounter (HOSPITAL_COMMUNITY): Payer: Self-pay | Admitting: Cardiology

## 2017-07-29 VITALS — BP 185/95 | HR 97 | Wt 81.8 lb

## 2017-07-29 DIAGNOSIS — I471 Supraventricular tachycardia: Secondary | ICD-10-CM | POA: Diagnosis not present

## 2017-07-29 DIAGNOSIS — I10 Essential (primary) hypertension: Secondary | ICD-10-CM

## 2017-07-29 DIAGNOSIS — R7989 Other specified abnormal findings of blood chemistry: Secondary | ICD-10-CM | POA: Insufficient documentation

## 2017-07-29 DIAGNOSIS — I5022 Chronic systolic (congestive) heart failure: Secondary | ICD-10-CM | POA: Diagnosis present

## 2017-07-29 DIAGNOSIS — Z79899 Other long term (current) drug therapy: Secondary | ICD-10-CM | POA: Diagnosis not present

## 2017-07-29 DIAGNOSIS — R627 Adult failure to thrive: Secondary | ICD-10-CM | POA: Insufficient documentation

## 2017-07-29 DIAGNOSIS — I255 Ischemic cardiomyopathy: Secondary | ICD-10-CM | POA: Diagnosis not present

## 2017-07-29 DIAGNOSIS — I2510 Atherosclerotic heart disease of native coronary artery without angina pectoris: Secondary | ICD-10-CM | POA: Diagnosis not present

## 2017-07-29 DIAGNOSIS — R55 Syncope and collapse: Secondary | ICD-10-CM | POA: Diagnosis not present

## 2017-07-29 DIAGNOSIS — I110 Hypertensive heart disease with heart failure: Secondary | ICD-10-CM | POA: Insufficient documentation

## 2017-07-29 DIAGNOSIS — E785 Hyperlipidemia, unspecified: Secondary | ICD-10-CM | POA: Diagnosis not present

## 2017-07-29 DIAGNOSIS — Z8673 Personal history of transient ischemic attack (TIA), and cerebral infarction without residual deficits: Secondary | ICD-10-CM | POA: Insufficient documentation

## 2017-07-29 DIAGNOSIS — Z7982 Long term (current) use of aspirin: Secondary | ICD-10-CM | POA: Diagnosis not present

## 2017-07-29 DIAGNOSIS — D649 Anemia, unspecified: Secondary | ICD-10-CM | POA: Insufficient documentation

## 2017-07-29 DIAGNOSIS — F329 Major depressive disorder, single episode, unspecified: Secondary | ICD-10-CM | POA: Diagnosis not present

## 2017-07-29 DIAGNOSIS — I428 Other cardiomyopathies: Secondary | ICD-10-CM | POA: Insufficient documentation

## 2017-07-29 LAB — BASIC METABOLIC PANEL
ANION GAP: 4 — AB (ref 5–15)
BUN: 15 mg/dL (ref 6–20)
CHLORIDE: 106 mmol/L (ref 101–111)
CO2: 27 mmol/L (ref 22–32)
Calcium: 10.8 mg/dL — ABNORMAL HIGH (ref 8.9–10.3)
Creatinine, Ser: 1.09 mg/dL — ABNORMAL HIGH (ref 0.44–1.00)
GFR calc Af Amer: 54 mL/min — ABNORMAL LOW (ref >60)
GFR calc non Af Amer: 47 mL/min — ABNORMAL LOW (ref >60)
GLUCOSE: 85 mg/dL (ref 65–99)
POTASSIUM: 5.3 mmol/L — AB (ref 3.5–5.1)
Sodium: 137 mmol/L (ref 135–145)

## 2017-07-29 MED ORDER — ROSUVASTATIN CALCIUM 10 MG PO TABS
10.0000 mg | ORAL_TABLET | Freq: Every day | ORAL | 3 refills | Status: DC
Start: 2017-07-29 — End: 2018-07-22

## 2017-07-29 MED FILL — ROSUVASTATIN CALCIUM 10 MG: 10 | 90 days supply | Qty: 90 | Fill #0

## 2017-07-29 NOTE — Progress Notes (Signed)
Patient ID: Julie Stein, female   DOB: 1935/07/26, 82 y.o.   MRN: [id number]   PCP: Dr. Darleene Cleaver Primary Cardiologist: Dr Shirlee Latch  HPI: Julie Stein is a 82 y.o.  female with a history of SVT, HTN, CAD, mixed ischemic/nonischemic cardiomyopathy, CHF and stroke. She presents for followup of HTN, CAD, and CHF.  She was admitted initially 08/23/15 with pulmonary edema. Echo on 08/26/15 revealed an EF of 20-25% with diffuse hypokinesis. It was thought she had a viral myocarditis or hypertensive cardiomyopathy. She developed transient weakness and was noted to have CVA by MRI.  She had R/LHC on 10/10 revealing normal to low filling pressures and normal cardiac output, moderate to severe ostial OM1 and severe distal LAD stenoses with plan for medical management. We thought her flash pulmonary edema and troponin elevation was likely from hypertensive crisis leading to demand ischemia. During this time, she was noted to have runs of symptomatic SVT.   Once stable, she was admitted to IP rehab, but then readmitted to hospital with syncope while on bedside commode requiring  CPR.  She had recurrent SVT noted when back on telemetry.  Syncope thought to be 2/2 rapid SVT versus vasovagal.  She had an EP study but SVT could not be triggered so no ablation was done.  Given further recurrences, she was started on amiodarone.  Amiodarone was subsequently stopped.   Echo 1/17 showed EF improved to 60-65%.    She had a significant rise in LFTs in 5/17, apparently had had a "bad cold" around this time.  LFTs were normal by 7/17.  At last office appointment in 2/18, she was doing poorly.  She had lost considerable weight overall. She had been intermittently confused.  She had progressive anemia, followed by hematology who has been concerned for anemia of renal disease.  Profound fatigue, not very active.  She developed difficulty swallowing and pills had to be crushed.  She had to use a walker. ?Depression as cause of  symptoms.    Later in 2/18, she was admitted with syncope.  Workup was negative.  She was started on Keppra for ?subclinical seizures.  She was admitted again in 3/18 with syncope and was found to be orthostatic.  Echo in 2/18 showed EF 60-65%.   Since that time, she has made remarkable improvement.  No definite cause of her failure to thrive was ever found.  Currently, she feels back to normal.  BP is high in the office today, but she has had white coat HTN.  BP at home (checks daily) runs 110s-130s.  No chest pain.  No exertional dyspnea.  Still uses a cane when she leaves the house for balance.  No lightheadedness/syncope. She is no longer taking Keppra.    ECG (personally reviewed): NSR, poor RWP  Labs (10/16): K 3.7, creatinine 1.19, HCT 30.1 Labs (11/16): K 3.8, creatinine 1.35, LDL 76, HDL 68, TSH normal, LFTs normal Labs (1/17): K 4.3, creatinine 1.35, TSH normal, AST 32, ALT 30, HCT 32.4.  Labs (04/07/16):  AST 123 ALT 197. Bili normal  Labs (6/17): AST 62, ALT 61, K 3.1, creatinine 1.28 Labs (7/17): LFTs normal, K 3.4, creatinine 1.48, TSH normal Labs (8/17): K 4.1, creatinine 1.5 Labs (1/18): K 2.9, creatinine 0.7, hgb 9.4 Labs (3/18): K 4.4, creatinine 0.63  PMH 1. Chronic systolic CHF: Mixed ischemic/nonischemic CMP.  Suspect long-standing HTN plays a major role.  Echo (10/16) with EF 20-25%, mild LVH, mild MR. Echo (1/17) with EF 60-65%, mild TR, severe  LAE.  - Echo (2/18): EF 60-65%, mild LVH, normal RV size and systolic function.  2. CAD:  LHC 10/16 with 20% proximal LAD, 40% mid LAD, 90% distal LAD, large high OM1 with 80% ostial stenosis => medically managed.  3. Hx of CVA: MRI head 08/28/15 showed small infarcts in watershed region.Pre-dated cardiac cath, likely related to HTN.  4. 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  Now on amiodarone given symptomatic frequent recurrences.   5. HTN:  Renal artery dopplers  08/30/15 did not show RAS.  6. Hyperlipidemia. 7. PFTs (11/16): suggestive of reactive airways disease like asthma.  8. Elevated LFTs: ?etiology.  9. Anemia 10. Depression  Current Outpatient Prescriptions  Medication Sig Dispense Refill  . aspirin EC 81 MG EC tablet Take 1 tablet (81 mg total) by mouth daily. 30 tablet 0  . bisacodyl (DULCOLAX) 5 MG EC tablet Take 1 tablet (5 mg total) by mouth daily as needed for moderate constipation. 30 tablet 0  . carvedilol (COREG) 6.25 MG tablet Take 1 tablet (6.25 mg total) by mouth 2 (two) times daily with a meal. 60 tablet 0  . feeding supplement (BOOST / RESOURCE BREEZE) LIQD Take 1 Container by mouth 2 (two) times daily with a meal.  0  . feeding supplement, ENSURE ENLIVE, (ENSURE ENLIVE) LIQD Take 237 mLs by mouth 2 (two) times daily between meals. 237 mL 12  . flintstones complete (FLINTSTONES) 60 MG chewable tablet Chew 2 tablets by mouth daily.    Marland Kitchen spironolactone (ALDACTONE) 25 MG tablet Take 25 mg by mouth daily.  3  . Potassium Chloride 40 MEQ/15ML (20%) SOLN TAKE 7 5 MLS BY MOUTH DAILY  DILUTE IN 4 6 OZ OF WATER OR JUICE PRIOR TO ADMINISTRATION  2  . rosuvastatin (CRESTOR) 10 MG tablet Take 1 tablet (10 mg total) by mouth daily. 90 tablet 3   No current facility-administered medications for this encounter.     Allergies  Allergen Reactions  . Demerol [Meperidine] Other (See Comments)    Hallucinations, out of body experience      Social History   Social History  . Marital status: Married    Spouse name: N/A  . Number of children: 4  . Years of education: Bachelors   Occupational History  . 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 Smoking status: Never Smoker  . Smokeless tobacco: Never Used  . Alcohol use No  . Drug use: No  . Sexual activity: No   Other Topics Concern  . Not on file   Social History Narrative   Lives at home with grandson and granddaughter.   Right-handed.   No caffeine use.       Family History  Problem Relation Age of Onset  . Hypertension Daughter   . Other Mother        unsure of medical history-says she passed away from an accident  . Other Father        unsure of medical history-says he passed away from natual causes    Vitals:   07/29/17 1020  BP: (!) 185/95  Pulse: 97  SpO2: 98%  Weight: 81 lb 12.8 oz (37.1 kg)    Wt Readings from Last 3 Encounters:  07/29/17 81 lb 12.8 oz (37.1 kg)  01/18/17 85 lb 8 oz (38.8 kg)  01/12/17 77 lb (34.9 kg)    PHYSICAL EXAM: General:  NAD, thin Neck: No JVD, no thyromegaly or thyroid nodule.  Lungs: Clear to auscultation bilaterally with normal respiratory effort. CV: Nondisplaced PMI.  Heart regular S1/S2, no S3/S4, no murmur.  No peripheral edema.  No carotid bruit.  Normal pedal pulses.  Abdomen: Soft, nontender, no hepatosplenomegaly, no distention.  Skin: Intact without lesions or rashes.  Neurologic: Alert and oriented x 3.  Psych: Normal affect. Extremities: No clubbing or cyanosis.  HEENT: Normal.   ASSESSMENT & PLAN: 1. Chronic Systolic HF: Mixed ischemic/nonischemic CMP, suspect long-standing HTN played a large role. ECHO 10/16 EF 20-25% with mild LVH. Repeat echo (1/17) showed EF improved back to normal range, 60-65%.  Echo in 2/18 showed that EF remained normal, 60-65%. She is euvolemic today, NYHA class II.   - Continue current Coreg and spironolactone.  BMET today with spironolactone use.  2. CAD: LHC 08/27/15 with 20% proximal LAD, 40% mid LAD, 90% distal LAD, large high OM1 with 80% ostial stenosis, medically managed.  No chest pain.  - Continue ASA 81. - Given fairly extensive CAD, I will have her start back on Crestor at 10 mg daily.  Will need lipids checked in 2 months.   3. CVA: MRI head 08/28/15 showed small infarcts in watershed region.Suspect related to HTN, neurologic symptoms preceded cath.  No residual deficit. 4. H/o SVT with syncope: No symptomatic recurrence. Unable to  reproduce with EP study.  - She is now off amiodarone.   5. HTN:  Renal artery dopplers 08/30/15 did not show RAS. BP high here but good readings from home (checks daily).  Continue current meds.  6. Elevated LFTs: Resolved.  7. Failure to thrive: This seems to have totally resolved.  She is now doing quite well.  No definite explanation for FTT. ?Depression-related.    Followup in 6 months.   Marca Ancona MD 07/29/2017

## 2017-07-29 NOTE — Patient Instructions (Signed)
RESTART Crestor 10 mg tablet once daily.  Routine lab work today. Will notify you of abnormal results, otherwise no news is good news!  Follow up 6 months with Dr. Shirlee Latch. We will call you closer to this time, or you may call our office to schedule 1 month before you are due to be seen. Take all medication as prescribed the day of your appointment. Bring all medications with you to your appointment.  Do the following things EVERYDAY: 1) Weigh yourself in the morning before breakfast. Write it down and keep it in a log. 2) Take your medicines as prescribed 3) Eat low salt foods-Limit salt (sodium) to 2000 mg per day.  4) Stay as active as you can everyday 5) Limit all fluids for the day to less than 2 liters

## 2017-07-30 ENCOUNTER — Telehealth (HOSPITAL_COMMUNITY): Payer: Self-pay | Admitting: *Deleted

## 2017-07-30 DIAGNOSIS — I5022 Chronic systolic (congestive) heart failure: Secondary | ICD-10-CM

## 2017-07-30 NOTE — Telephone Encounter (Signed)
-----   Message from Laurey Morale, MD sent at 07/29/2017  4:12 PM EDT ----- Stop KCl, repeat BMET 10 days.

## 2017-08-10 ENCOUNTER — Ambulatory Visit (HOSPITAL_COMMUNITY)
Admission: RE | Admit: 2017-08-10 | Discharge: 2017-08-10 | Disposition: A | Discharge status: Home / Self Care | Payer: Medicare Other | Source: Ambulatory Visit | Attending: Cardiology | Admitting: Cardiology

## 2017-08-10 DIAGNOSIS — I5022 Chronic systolic (congestive) heart failure: Secondary | ICD-10-CM | POA: Insufficient documentation

## 2017-08-10 LAB — BASIC METABOLIC PANEL
Anion gap: 6 (ref 5–15)
BUN: 13 mg/dL (ref 6–20)
CALCIUM: 10.2 mg/dL (ref 8.9–10.3)
CO2: 28 mmol/L (ref 22–32)
CREATININE: 0.94 mg/dL (ref 0.44–1.00)
Chloride: 106 mmol/L (ref 101–111)
GFR calc Af Amer: >60 mL/min (ref >60)
GFR, EST NON AFRICAN AMERICAN: 56 mL/min — AB (ref >60)
GLUCOSE: 90 mg/dL (ref 65–99)
Potassium: 4.5 mmol/L (ref 3.5–5.1)
SODIUM: 140 mmol/L (ref 135–145)

## 2017-08-14 MED FILL — SPIRONOLACTONE 25 MG TABLET: 25 | 30 days supply | Qty: 30 | Fill #4

## 2017-08-19 IMAGING — MR MR MRA HEAD W/O CM
9 of 12 series · 28 of 48 positions shown · non-contrast
Comparison: Prior CT from 08/27/2015.

CLINICAL DATA: Initial evaluation for transient left-sided weakness
following an episode of unresponsiveness and hypotension.

EXAM:
MRI HEAD WITHOUT CONTRAST
MRA HEAD WITHOUT CONTRAST
TECHNIQUE: Multiplanar, multiecho pulse sequences of the brain and surrounding
structures were obtained without intravenous contrast. Angiographic
images of the head were obtained using MRA technique without
contrast.

[Series 3: T1 · sagittal · 5.0mm · 0.47mm/px · 1 of 23 slices shown]
[im 1/23]
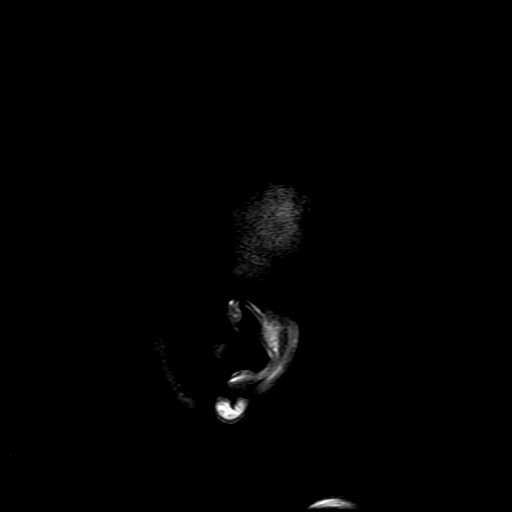

[Series 4: DWI · axial · 3.0mm · 1.09mm/px · z∈[-102,+29]mm · 6 of 90 slices shown (1 of 4)]
[im 1/90]
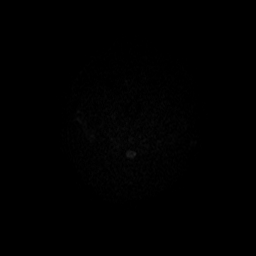
[im 18/90]
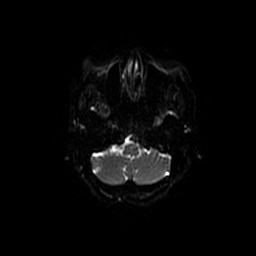
[im 36/90]
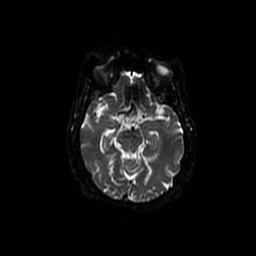
[im 54/90]
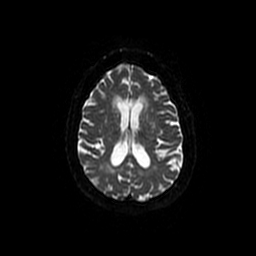
[im 72/90]
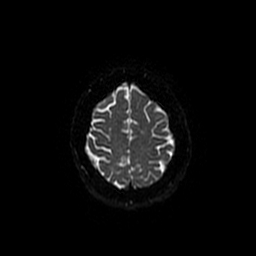
[im 90/90]
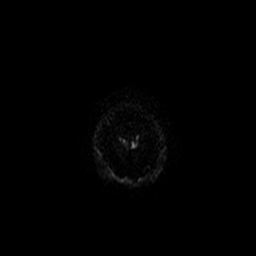

[Series 5: (id) mt fs · axial · 1.4mm · 0.43mm/px · z∈[-127,-99]mm · 3 of 148 slices shown]
[im 1/148]
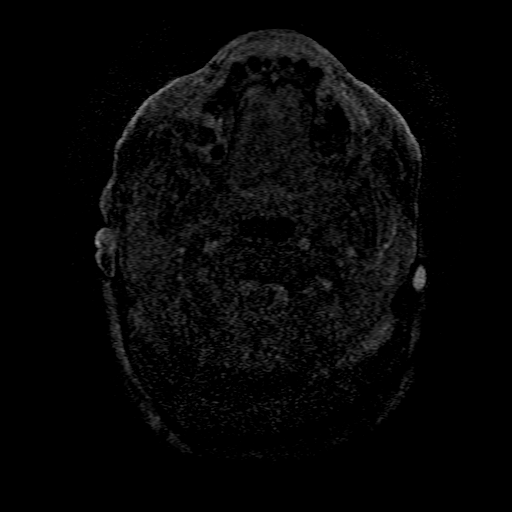
[im 27/148]
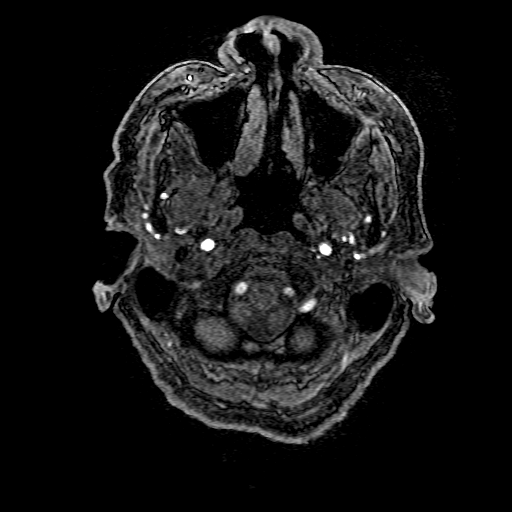
[im 41/148]
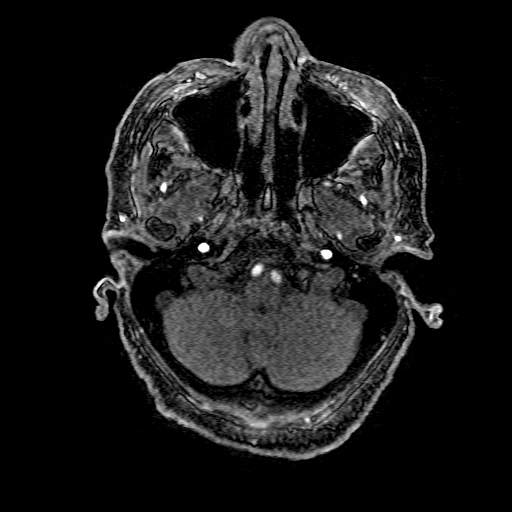

[Series 6: T2 · axial · 5.0mm · 0.43mm/px · z∈[-104,+27]mm · 2 of 23 slices shown (1 of 2)]
[im 1/23]
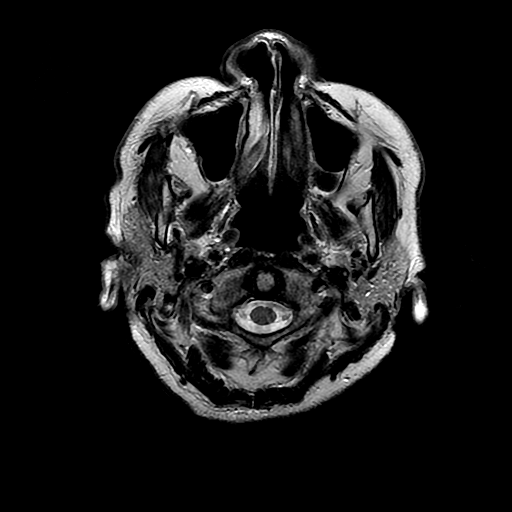
[im 23/23]
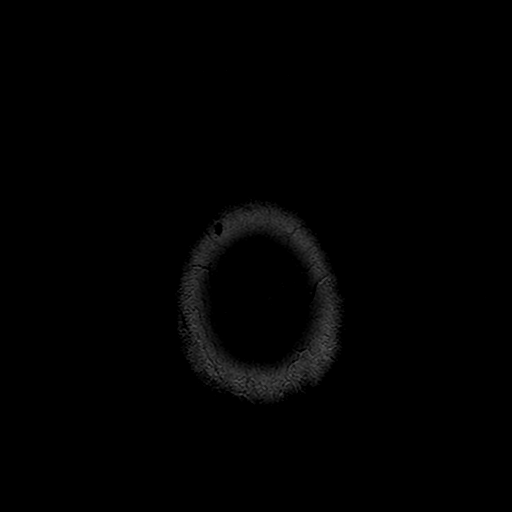

[Series 7: DWI · coronal · 5.0mm · 1.09mm/px · 5 of 62 slices shown (2 of 4)]
[im 1/62]
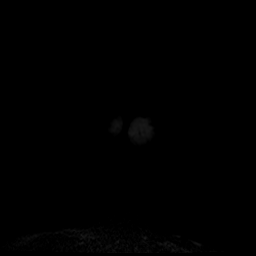
[im 16/62]
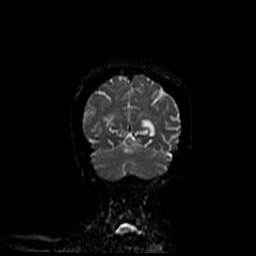
[im 31/62]
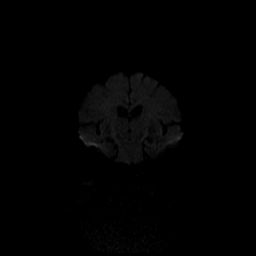
[im 46/62]
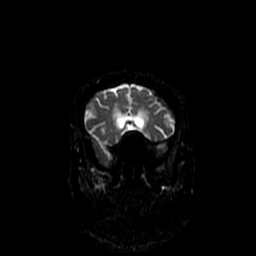
[im 62/62]
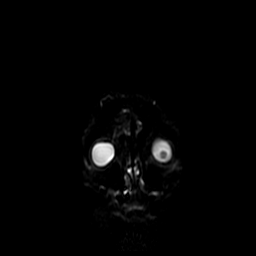

[Series 8: FLAIR · axial · 5.0mm · 0.43mm/px · z∈[-104,+27]mm · 2 of 23 slices shown]
[im 1/23]
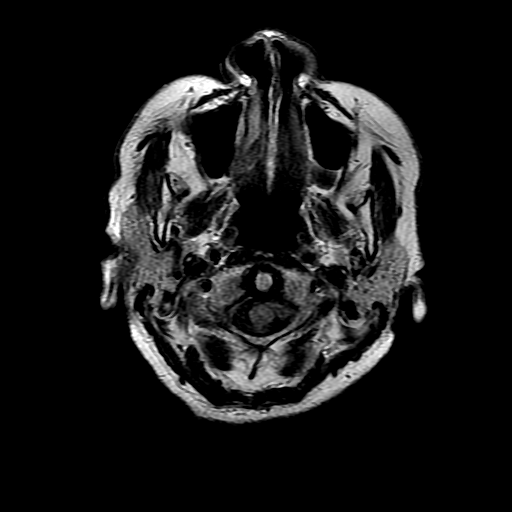
[im 23/23]
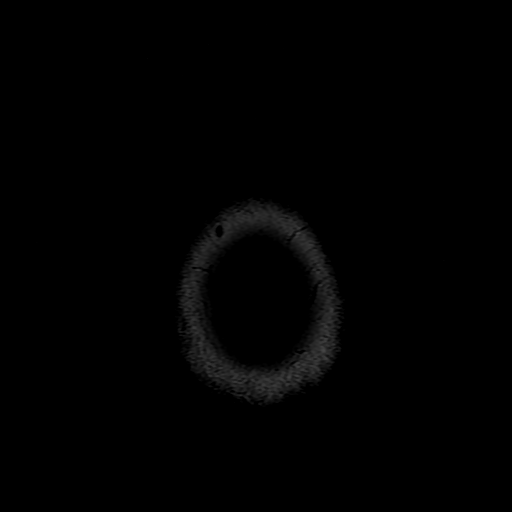

[Series 11: T2 · coronal · 5.0mm · 0.39mm/px · 2 of 24 slices shown (2 of 2)]
[im 1/24]
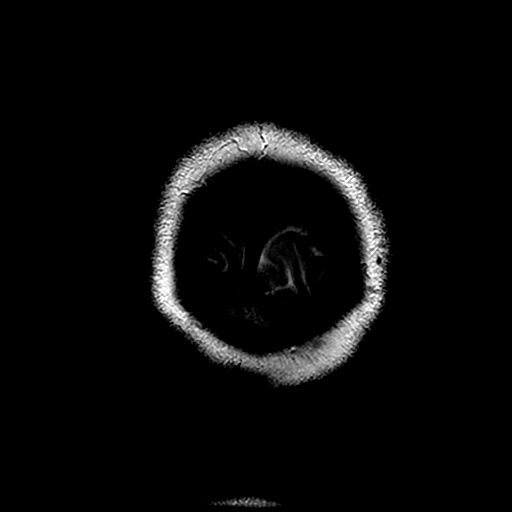
[im 24/24]
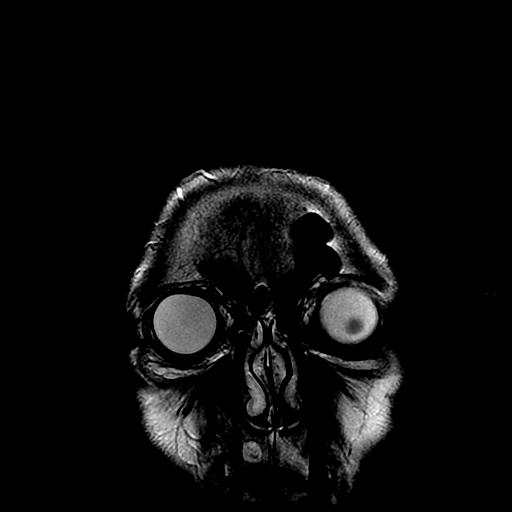

[Series 400: DWI · axial · 3.0mm · 1.09mm/px · z∈[-102,+29]mm · 4 of 45 slices shown (3 of 4)]
[im 1/45]
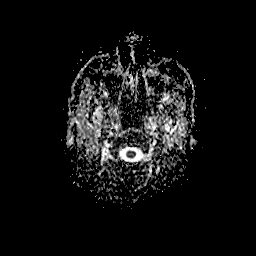
[im 15/45]
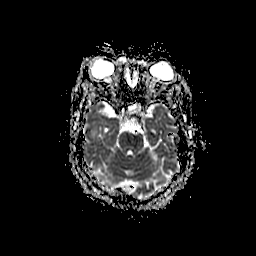
[im 30/45]
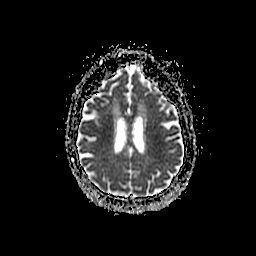
[im 45/45]
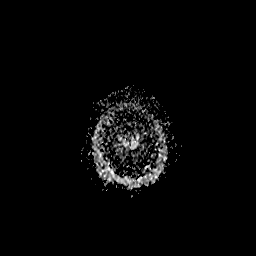

[Series 700: DWI · coronal · 5.0mm · 1.09mm/px · 3 of 31 slices shown (4 of 4)]
[im 1/31]
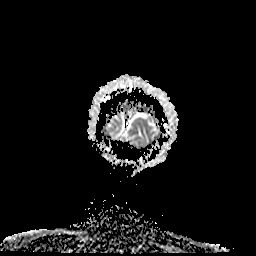
[im 16/31]
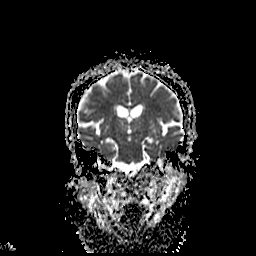
[im 31/31]
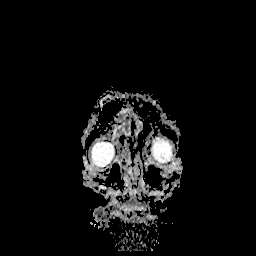

[28 of 48 positions shown; findings below may reference images not displayed]

FINDINGS: MRI HEAD FINDINGS

Diffuse prominence of the CSF containing spaces is compatible with
generalized age-related cerebral atrophy. Patchy and confluent
T2/FLAIR hyperintensity within the periventricular and deep white
matter both cerebral hemispheres most consistent with chronic small
vessel ischemic disease. Small remote lacunar infarct within the
periventricular white matter of the left corona radiata.

There is a few small foci of patchy restricted diffusion within the
high left frontal lobe and right frontoparietal regions bilaterally
(series 4, image 33, 35). Additional tiny punctate cortical infarct
more inferiorly within the right occipital lobe (series 4, image
26). Associated signal loss seen on corresponding ADC map. No
associated hemorrhage or mass effect. Bodies consistent with small
acute ischemic infarcts. Given the distribution of these findings,
underlying watershed etiology is favored. Normal intravascular flow
voids are maintained.

Additional scattered subcentimeter foci of susceptibility artifact
seen in within the peripheral cortices of the bilateral cerebral
hemispheres on gradient echo sequence, consistent with small chronic
micro hemorrhages. While these could potentially be related
underlying hypertension, possible amyloid angiopathy could be
considered given their somewhat peripheral distribution.

No mass lesion, midline shift, or mass effect. No hydrocephalus. No
extra-axial fluid collection.

Craniocervical junction within normal limits. Mild degenerative
spondylolysis noted within the visualized upper cervical spine.

Pituitary gland normal. Thinning of the anterior body of the corpus
callosum noted, which may related to remote ischemia.

No acute abnormality about the orbits. Mild right-sided
exophthalmos.

Paranasal sinuses are clear. No mastoid effusion. Inner ear
structures within normal limits.

Bone marrow signal intensity within normal limits. No scalp soft
tissue abnormality.

MRA HEAD FINDINGS

ANTERIOR CIRCULATION:

Visualized distal cervical segments of the internal carotid arteries
are patent with antegrade flow. Petrous segments widely patent.
Scattered multi focal atheromatous irregularity within the cavernous
and supraclinoid left ICA. There is a short-segment moderate
stenosis at the proximal supraclinoid left ICA (series 5, image 72).
Cavernous and supraclinoid right ICA widely patent. Right A1 segment
widely patent. Left A1 segment slightly hypoplastic. Anterior
communicating artery patent. Atheromatous irregularity within the
anterior cerebral arteries bilaterally.

M1 segments widely patent without stenosis or occlusion.
Atheromatous irregularity within the left M1 segment. MCA
bifurcations within normal limits. MCA branches symmetric
bilaterally. Distal small vessel disease present within the MCA
branches bilaterally.

POSTERIOR CIRCULATION:

Vertebral arteries patent to the vertebrobasilar junction. Right
vertebral artery slightly diminutive. Posterior inferior cerebral
arteries not well evaluated on this exam. Basilar artery widely
patent. Small left anterior inferior cerebral artery noted. Superior
cerebellar arteries patent. Both sub posterior cerebral arteries
arise from the basilar artery and are well opacified to their distal
aspects. Distal small vessel disease within the PCA branches
bilaterally. There is a more focal short-segment moderate stenosis
within the proximal right P2 segment (series 505, image 15).

No aneurysm or vascular malformation.
IMPRESSION: MRI HEAD IMPRESSION:

1. Small volume patchy cortical and subcortical ischemic infarcts
within the left frontal and right frontoparietal region, with
additional tiny cortical infarct within the right occipital lobe.
Given the distribution of these infarcts, an underlying watershed
etiology is favored. No associated hemorrhage or mass effect.
2. Scattered small chronic micro hemorrhages as above. While these
might be related to chronic underlying hypertension, possible
amyloid angiopathy could also be considered given their somewhat
peripheral distribution.
3. Atrophy with chronic microvascular ischemic disease.

MRA HEAD IMPRESSION:

1. No large vessel or proximal arterial branch occlusion identified.
2. Short-segment moderate stenosis within the supraclinoid left ICA.
3. Short-segment moderate stenosis within the proximal right P2
segment.
4. Distal small vessel atheromatous disease within the MCA, ACA, and
PCA branches bilaterally

## 2017-08-19 MED FILL — CARVEDILOL 6.25 MG TABLET: 6.25 | 30 days supply | Qty: 60 | Fill #3

## 2017-08-22 IMAGING — CR DG CHEST 1V PORT
1 series · 1 of 1 positions shown · non-contrast
Comparison: 08/28/2015

CLINICAL DATA: Chest pain beginning after physical therapy
yesterday.

EXAM:
PORTABLE CHEST 1 VIEW

[AP]
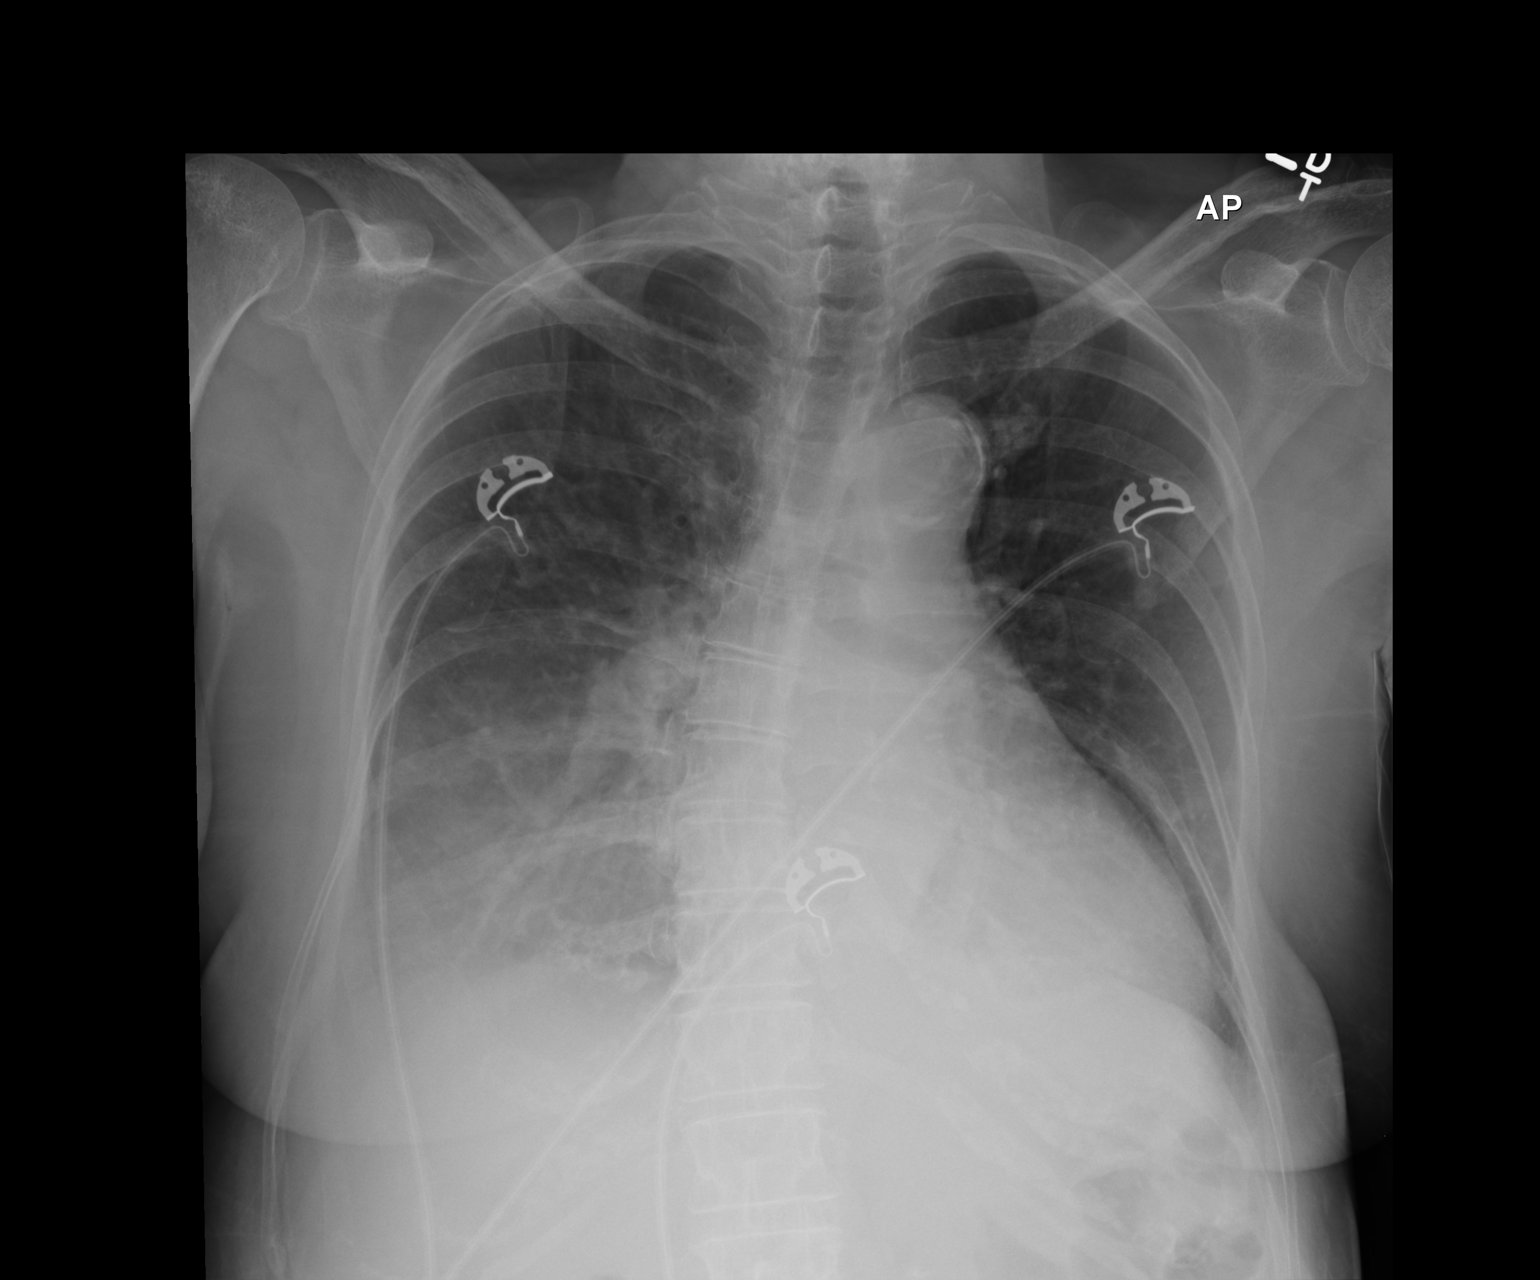

[1 of 1 positions shown; findings below may reference images not displayed]

FINDINGS: Borderline heart size without vascular congestion. Infiltration in
the right lung base obscuring the right hemidiaphragm. Probable
small right pleural effusion. Appearance suggest pneumonia.
Atelectasis in the left lung base is improved since previous study.
No pneumothorax. Calcified and tortuous aorta. Mild thoracic
scoliosis convex towards the right.
IMPRESSION: Infiltration in the right lung base with probable small right
pleural effusion. Findings suggest pneumonia.

## 2017-08-22 IMAGING — DX DG CHEST DECUBITUS*R*
1 series · 1 of 1 positions shown · non-contrast
Comparison: Same day.

CLINICAL DATA: Dyspnea.

EXAM:
CHEST - RIGHT DECUBITUS

[chest decu]
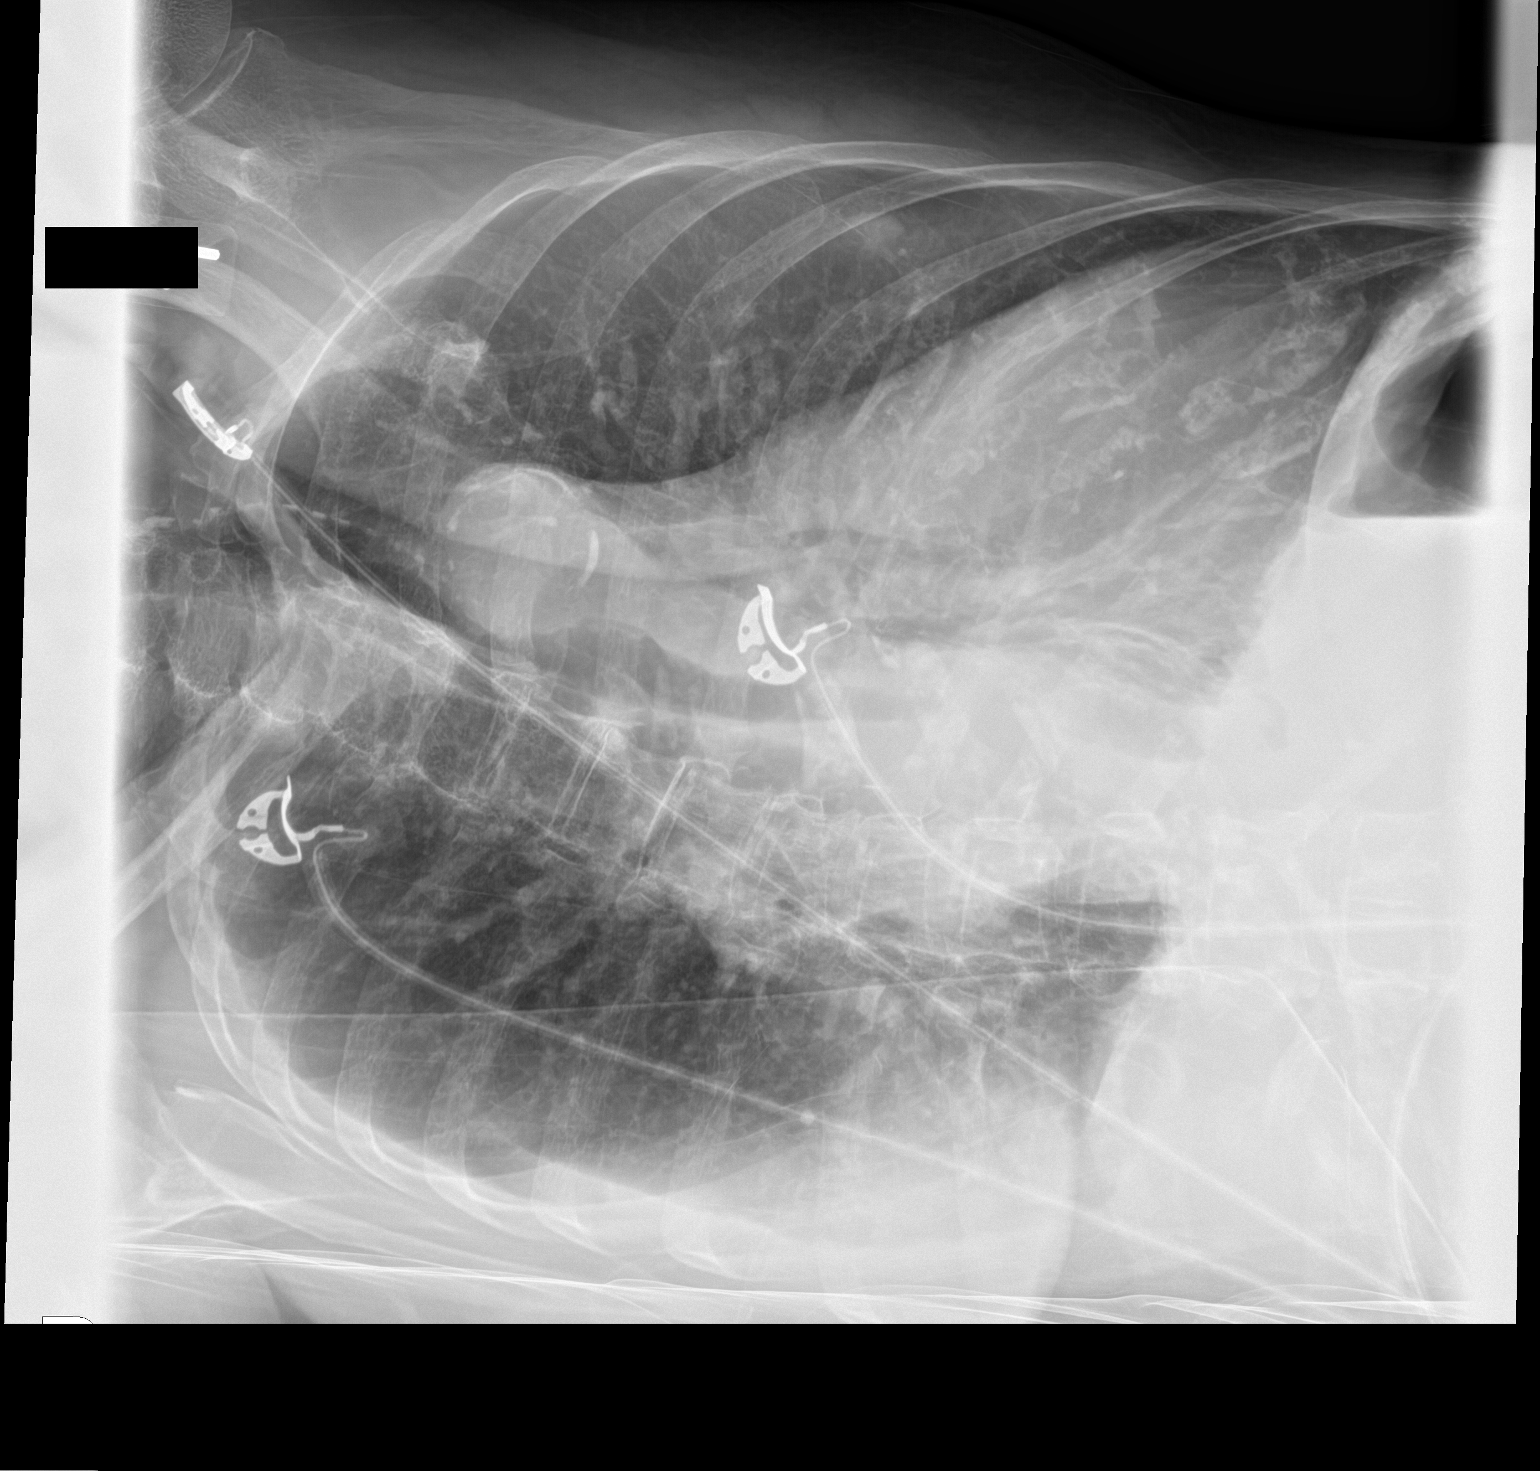

[1 of 1 positions shown; findings below may reference images not displayed]

FINDINGS: Right lateral decubitus view of the chest demonstrates mild free
flowing right pleural effusion.
IMPRESSION: Mild free flowing right pleural effusion.

## 2017-08-22 IMAGING — DX DG CHEST 2V
2 series · 2 of 2 positions shown · non-contrast
Comparison: August 31, 2015.

CLINICAL DATA: Dyspnea.

EXAM:
CHEST  2 VIEW

[chest lat]
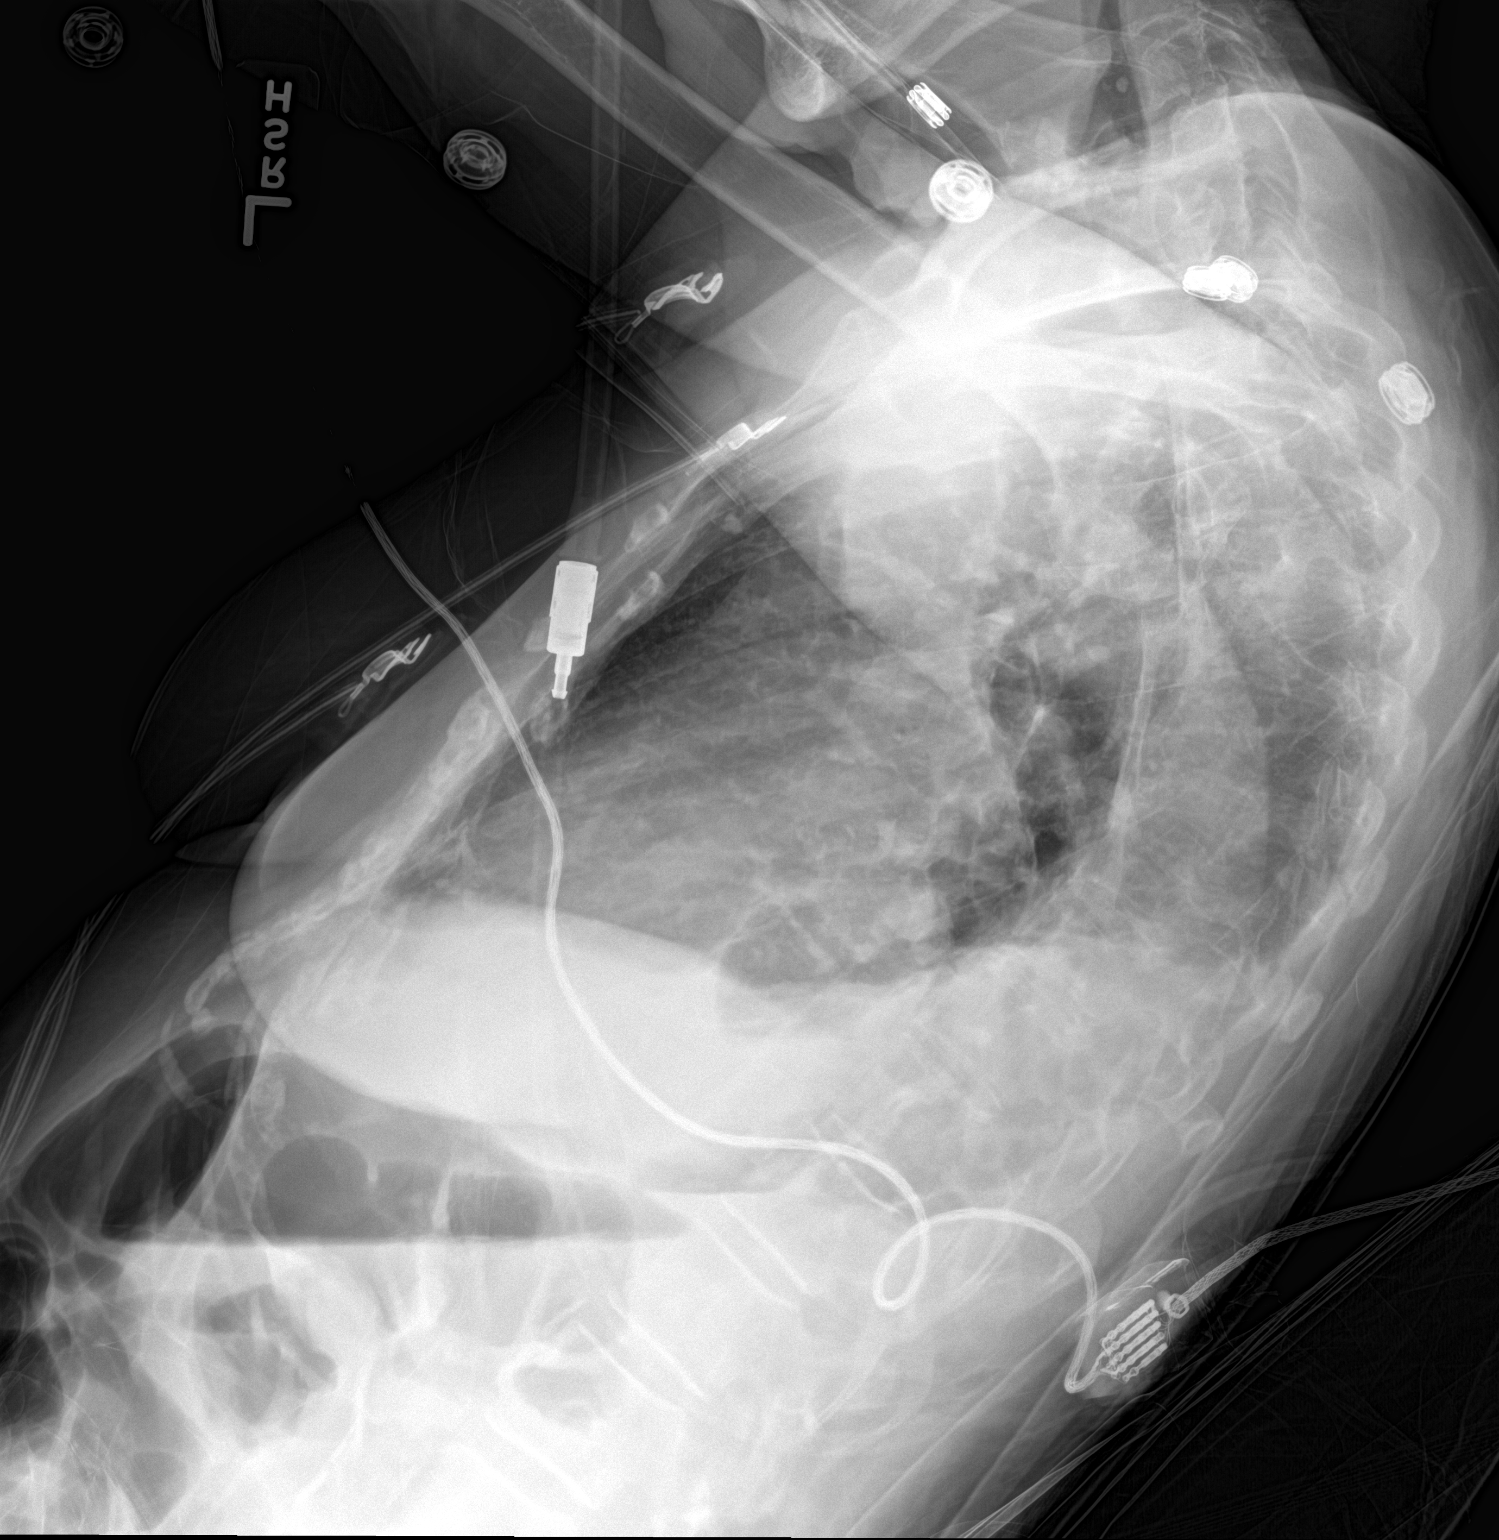

[chest ap]
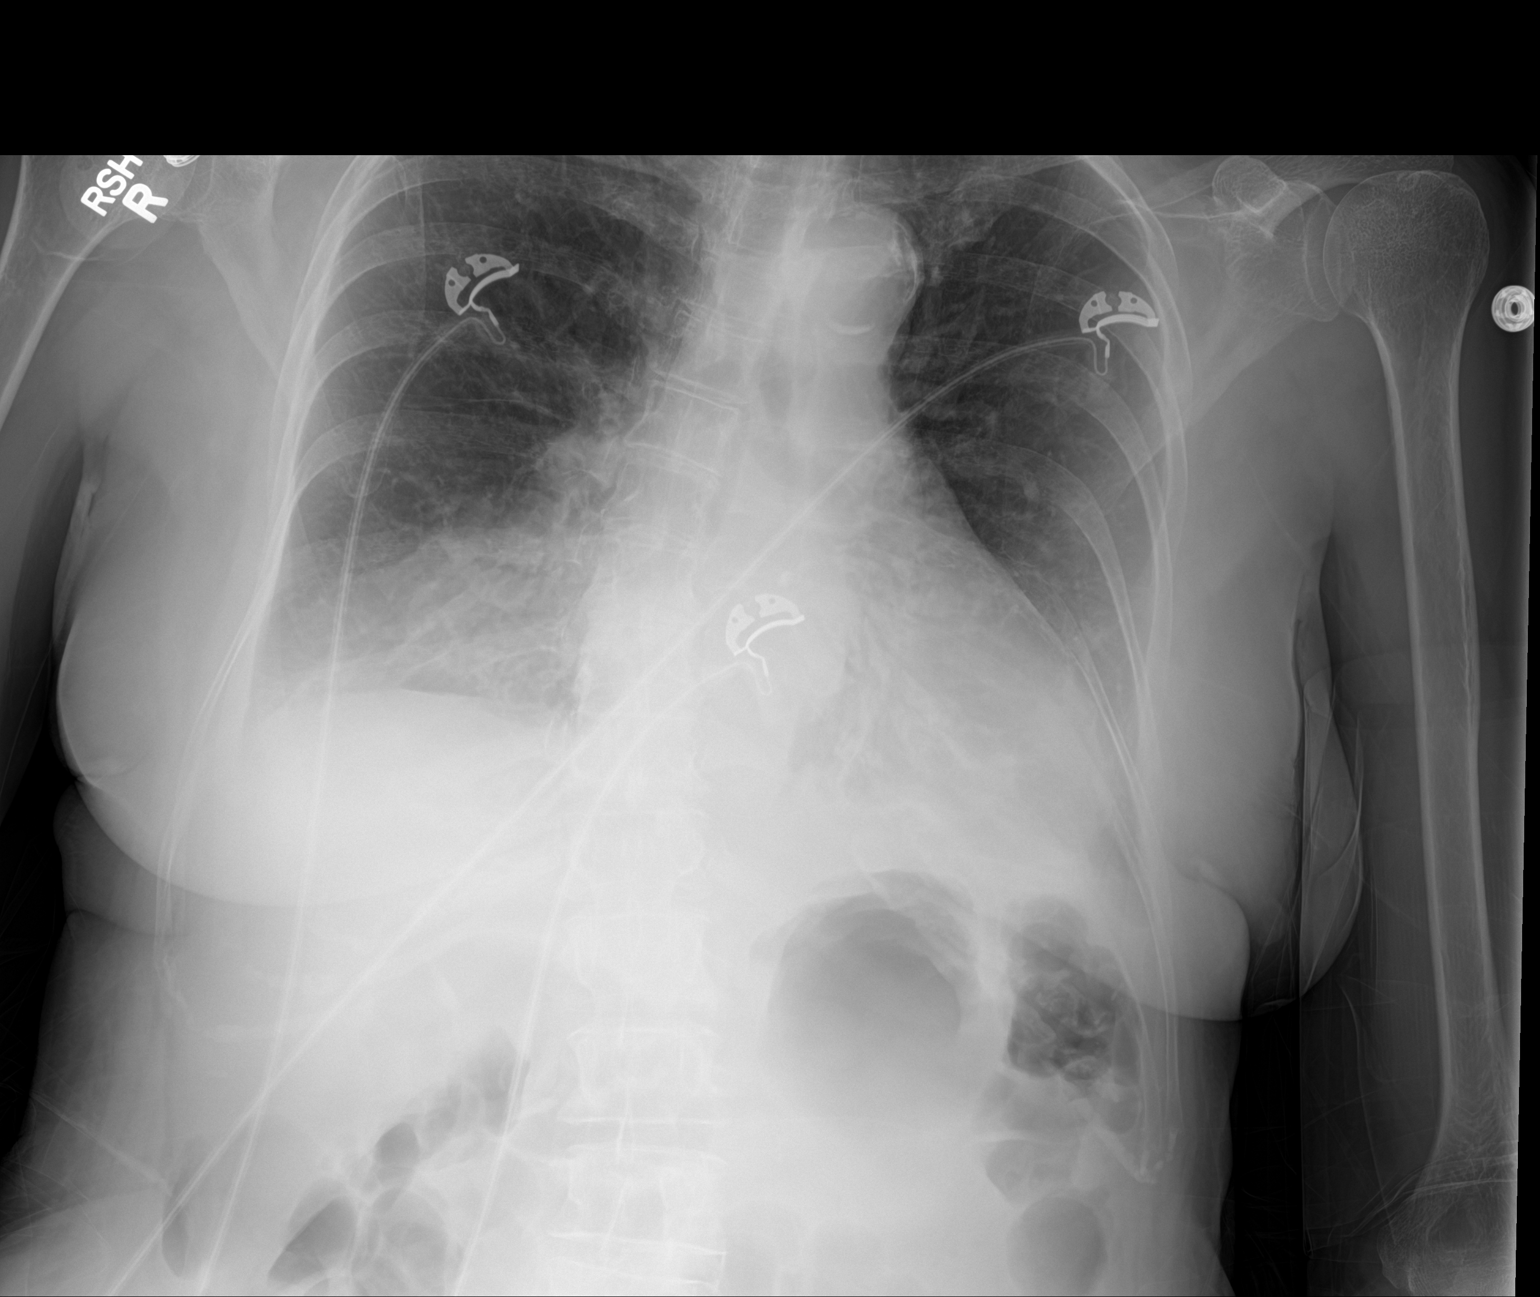

[2 of 2 positions shown; findings below may reference images not displayed]

FINDINGS: Stable cardiomediastinal silhouette. No pneumothorax is noted.
Stable right basilar opacity is noted concerning for pneumonia or
atelectasis with associated pleural effusion. Mild left basilar
subsegmental atelectasis is noted. Bony thorax is unremarkable.
IMPRESSION: Stable right basilar opacity is noted concerning for pneumonia or
atelectasis with associated pleural effusion. Mild left basilar
subsegmental atelectasis is noted as well.

## 2017-09-09 MED FILL — SPIRONOLACTONE 25 MG TABLET: 25 | 30 days supply | Qty: 30 | Fill #5

## 2017-09-11 MED FILL — CARVEDILOL 6.25 MG TABLET: 6.25 | 30 days supply | Qty: 60 | Fill #4

## 2017-10-13 MED FILL — SPIRONOLACTONE 25 MG TABLET: 25 | 30 days supply | Qty: 30 | Fill #0

## 2017-10-19 MED FILL — CARVEDILOL 6.25 MG TABLET: 6.25 | 30 days supply | Qty: 60 | Fill #5

## 2017-11-02 MED FILL — ROSUVASTATIN CALCIUM 10 MG: 10 | 90 days supply | Qty: 90 | Fill #1

## 2017-11-06 MED FILL — SPIRONOLACTONE 25 MG TABLET: 25 | 30 days supply | Qty: 30 | Fill #1

## 2017-11-19 MED FILL — CARVEDILOL 6.25 MG TABLET: 6.25 | 30 days supply | Qty: 60 | Fill #0

## 2017-12-09 MED FILL — SPIRONOLACTONE 25 MG TABLET: 25 | 30 days supply | Qty: 30 | Fill #2

## 2017-12-21 MED FILL — CARVEDILOL 6.25 MG TABLET: 6.25 | 30 days supply | Qty: 60 | Fill #1

## 2018-01-11 ENCOUNTER — Other Ambulatory Visit (HOSPITAL_COMMUNITY): Payer: Self-pay | Admitting: Cardiology

## 2018-01-14 MED FILL — SPIRONOLACTONE 25 MG TABLET: 25 | 90 days supply | Qty: 90 | Fill #0

## 2018-01-20 MED FILL — CARVEDILOL 6.25 MG TABLET: 6.25 | 30 days supply | Qty: 60 | Fill #2

## 2018-01-29 MED FILL — ROSUVASTATIN CALCIUM 10 MG: 10 | 90 days supply | Qty: 90 | Fill #2

## 2018-02-25 MED FILL — CARVEDILOL 6.25 MG TABLET: 6.25 | 60 days supply | Qty: 120 | Fill #3

## 2018-04-13 MED FILL — SPIRONOLACTONE 25 MG TABLET: 25 | 90 days supply | Qty: 90 | Fill #1

## 2018-04-29 MED FILL — CARVEDILOL 6.25 MG TABLET: 6.25 | 30 days supply | Qty: 60 | Fill #4

## 2018-04-29 MED FILL — ROSUVASTATIN CALCIUM 10 MG: 10 | 90 days supply | Qty: 90 | Fill #3

## 2018-05-28 MED FILL — CARVEDILOL 6.25 MG TABLET: 6.25 | 30 days supply | Qty: 60 | Fill #0

## 2018-07-13 MED FILL — SPIRONOLACTONE 25 MG TABLET: 25 | 90 days supply | Qty: 90 | Fill #2

## 2018-07-22 ENCOUNTER — Other Ambulatory Visit (HOSPITAL_COMMUNITY): Payer: Self-pay | Admitting: Cardiology

## 2018-07-22 MED FILL — ROSUVASTATIN CALCIUM 10 MG: 10 | 90 days supply | Qty: 90 | Fill #0

## 2018-07-22 MED FILL — CARVEDILOL 6.25 MG TABLET: 6.25 | 30 days supply | Qty: 60 | Fill #1

## 2018-09-16 MED FILL — CARVEDILOL 6.25 MG TABLET: 6.25 | 30 days supply | Qty: 60 | Fill #2

## 2018-10-07 MED FILL — SPIRONOLACTONE 25 MG TABLET: 25 | 90 days supply | Qty: 90 | Fill #3

## 2018-11-22 ENCOUNTER — Other Ambulatory Visit (HOSPITAL_COMMUNITY): Payer: Self-pay

## 2018-11-22 MED ORDER — SPIRONOLACTONE 25 MG PO TABS: 25.0000 mg | ORAL_TABLET | Freq: Every day | ORAL | 0 refills | Status: DC

## 2018-11-22 MED ORDER — CARVEDILOL 6.25 MG PO TABS: 6.2500 mg | ORAL_TABLET | Freq: Two times a day (BID) | ORAL | 0 refills | Status: DC

## 2018-11-22 MED ORDER — ROSUVASTATIN CALCIUM 10 MG PO TABS: 10.0000 mg | ORAL_TABLET | Freq: Every day | ORAL | 0 refills | Status: DC

## 2018-11-22 MED FILL — ROSUVASTATIN CALCIUM 10 MG: 10 | 90 days supply | Qty: 90 | Fill #0

## 2018-11-22 MED FILL — CARVEDILOL 6.25 MG TABLET: 6.25 | 90 days supply | Qty: 180 | Fill #0

## 2019-01-25 ENCOUNTER — Encounter (HOSPITAL_COMMUNITY): Payer: Self-pay | Admitting: Cardiology

## 2019-01-25 ENCOUNTER — Other Ambulatory Visit: Payer: Self-pay

## 2019-01-25 ENCOUNTER — Ambulatory Visit (HOSPITAL_COMMUNITY)
Admission: RE | Admit: 2019-01-25 | Discharge: 2019-01-25 | Disposition: A | Discharge status: Home / Self Care | Payer: Self-pay | Source: Ambulatory Visit | Attending: Cardiology | Admitting: Cardiology

## 2019-01-25 VITALS — BP 198/102 | HR 72 | Wt 99.0 lb

## 2019-01-25 DIAGNOSIS — I10 Essential (primary) hypertension: Secondary | ICD-10-CM

## 2019-01-25 DIAGNOSIS — Z79899 Other long term (current) drug therapy: Secondary | ICD-10-CM | POA: Insufficient documentation

## 2019-01-25 DIAGNOSIS — I471 Supraventricular tachycardia: Secondary | ICD-10-CM | POA: Insufficient documentation

## 2019-01-25 DIAGNOSIS — I2510 Atherosclerotic heart disease of native coronary artery without angina pectoris: Secondary | ICD-10-CM | POA: Insufficient documentation

## 2019-01-25 DIAGNOSIS — Z7982 Long term (current) use of aspirin: Secondary | ICD-10-CM | POA: Insufficient documentation

## 2019-01-25 DIAGNOSIS — Z8249 Family history of ischemic heart disease and other diseases of the circulatory system: Secondary | ICD-10-CM | POA: Insufficient documentation

## 2019-01-25 DIAGNOSIS — Z885 Allergy status to narcotic agent status: Secondary | ICD-10-CM | POA: Insufficient documentation

## 2019-01-25 DIAGNOSIS — E785 Hyperlipidemia, unspecified: Secondary | ICD-10-CM | POA: Insufficient documentation

## 2019-01-25 DIAGNOSIS — I428 Other cardiomyopathies: Secondary | ICD-10-CM | POA: Insufficient documentation

## 2019-01-25 DIAGNOSIS — Z8673 Personal history of transient ischemic attack (TIA), and cerebral infarction without residual deficits: Secondary | ICD-10-CM | POA: Insufficient documentation

## 2019-01-25 DIAGNOSIS — I110 Hypertensive heart disease with heart failure: Secondary | ICD-10-CM | POA: Insufficient documentation

## 2019-01-25 DIAGNOSIS — F329 Major depressive disorder, single episode, unspecified: Secondary | ICD-10-CM | POA: Insufficient documentation

## 2019-01-25 DIAGNOSIS — D649 Anemia, unspecified: Secondary | ICD-10-CM | POA: Insufficient documentation

## 2019-01-25 DIAGNOSIS — I5022 Chronic systolic (congestive) heart failure: Secondary | ICD-10-CM | POA: Insufficient documentation

## 2019-01-25 DIAGNOSIS — R627 Adult failure to thrive: Secondary | ICD-10-CM | POA: Insufficient documentation

## 2019-01-25 LAB — LIPID PANEL
CHOL/HDL RATIO: 1.9 ratio
CHOLESTEROL: 173 mg/dL (ref 0–200)
HDL: 91 mg/dL (ref >40)
LDL Cholesterol: 70 mg/dL (ref 0–99)
TRIGLYCERIDES: 60 mg/dL (ref <150)
VLDL: 12 mg/dL (ref 0–40)

## 2019-01-25 LAB — CBC
HCT: 38 % (ref 36.0–46.0)
HEMOGLOBIN: 11.4 g/dL — AB (ref 12.0–15.0)
MCH: 22.8 pg — ABNORMAL LOW (ref 26.0–34.0)
MCHC: 30 g/dL (ref 30.0–36.0)
MCV: 76.2 fL — ABNORMAL LOW (ref 80.0–100.0)
Platelets: 227 10*3/uL (ref 150–400)
RBC: 4.99 MIL/uL (ref 3.87–5.11)
RDW: 15.4 % (ref 11.5–15.5)
WBC: 8.2 10*3/uL (ref 4.0–10.5)
nRBC: 0 % (ref 0.0–0.2)

## 2019-01-25 LAB — BASIC METABOLIC PANEL
ANION GAP: 9 (ref 5–15)
BUN: 14 mg/dL (ref 8–23)
CO2: 26 mmol/L (ref 22–32)
Calcium: 10.8 mg/dL — ABNORMAL HIGH (ref 8.9–10.3)
Chloride: 104 mmol/L (ref 98–111)
Creatinine, Ser: 1.06 mg/dL — ABNORMAL HIGH (ref 0.44–1.00)
GFR calc Af Amer: 57 mL/min — ABNORMAL LOW (ref >60)
GFR calc non Af Amer: 49 mL/min — ABNORMAL LOW (ref >60)
Glucose, Bld: 106 mg/dL — ABNORMAL HIGH (ref 70–99)
POTASSIUM: 4.1 mmol/L (ref 3.5–5.1)
SODIUM: 139 mmol/L (ref 135–145)

## 2019-01-25 NOTE — Patient Instructions (Addendum)
Labs done today  Your physician has requested that you have an echocardiogram. Echocardiography is a painless test that uses sound waves to create images of your heart. It provides your doctor with information about the size and shape of your heart and how well your heart's chambers and valves are working. This procedure takes approximately one hour. There are no restrictions for this procedure.  Nurse Visit, please bring your home BP machine to this appointment for comparison  Please call our office July to schedule your follow up appointment for September

## 2019-01-26 NOTE — Progress Notes (Signed)
Patient ID: Julie Stein, female   DOB: Feb 16, 1935, 84 y.o.   MRN: [id number]   PCP: Dr. Darleene Cleaver Primary Cardiologist: Dr Shirlee Latch  HPI: Julie Stein is a 84 y.o.  female with a history of SVT, HTN, CAD, mixed ischemic/nonischemic cardiomyopathy, CHF and stroke. She presents for followup of HTN, CAD, and CHF.  She was admitted initially 08/23/15 with pulmonary edema. Echo on 08/26/15 revealed an EF of 20-25% with diffuse hypokinesis. It was thought she had a viral myocarditis or hypertensive cardiomyopathy. She developed transient weakness and was noted to have CVA by MRI.  She had R/LHC on 10/10 revealing normal to low filling pressures and normal cardiac output, moderate to severe ostial OM1 and severe distal LAD stenoses with plan for medical management. We thought her flash pulmonary edema and troponin elevation was likely from hypertensive crisis leading to demand ischemia. During this time, she was noted to have runs of symptomatic SVT.   Once stable, she was admitted to IP rehab, but then readmitted to hospital with syncope while on bedside commode requiring  CPR.  She had recurrent SVT noted when back on telemetry.  Syncope thought to be 2/2 rapid SVT versus vasovagal.  She had an EP study but SVT could not be triggered so no ablation was done.  Given further recurrences, she was started on amiodarone.  Amiodarone was subsequently stopped.   Echo 1/17 showed EF improved to 60-65%.    She had a significant rise in LFTs in 5/17, apparently had had a "bad cold" around this time.  LFTs were normal by 7/17.  At last office appointment in 2/18, she was doing poorly.  She had lost considerable weight overall. She had been intermittently confused.  She had progressive anemia, followed by hematology who has been concerned for anemia of renal disease.  Profound fatigue, not very active.  She developed difficulty swallowing and pills had to be crushed.  She had to use a walker. ?Depression as cause of  symptoms.    Later in 2/18, she was admitted with syncope.  Workup was negative.  She was started on Keppra for ?subclinical seizures.  She was admitted again in 3/18 with syncope and was found to be orthostatic.  Echo in 2/18 showed EF 60-65%.   She returns for followup of CHF and HTN today.  She has been doing very well for the last 2 years.  I have not seen her over that time.  No significant exertional dyspnea. She walks for exercise. No chest pain.  BP very high today, she has a history of "white coat" hypertension.  At home, she checks BP on most days and it runs SBP 110s-120s generally.      ECG (personally reviewed): NSR, LVH with repolarization abnormality.   Labs (10/16): K 3.7, creatinine 1.19, HCT 30.1 Labs (11/16): K 3.8, creatinine 1.35, LDL 76, HDL 68, TSH normal, LFTs normal Labs (1/17): K 4.3, creatinine 1.35, TSH normal, AST 32, ALT 30, HCT 32.4.  Labs (04/07/16):  AST 123 ALT 197. Bili normal  Labs (6/17): AST 62, ALT 61, K 3.1, creatinine 1.28 Labs (7/17): LFTs normal, K 3.4, creatinine 1.48, TSH normal Labs (8/17): K 4.1, creatinine 1.5 Labs (1/18): K 2.9, creatinine 0.7, hgb 9.4 Labs (3/18): K 4.4, creatinine 0.63 Labs (9/18): K 4.5, creatinine 0.94  PMH 1. Chronic systolic CHF: Mixed ischemic/nonischemic CMP.  Suspect long-standing HTN plays a major role.  Echo (10/16) with EF 20-25%, mild LVH, mild MR. Echo (1/17) with EF 60-65%, mild  TR, severe LAE.  - Echo (2/18): EF 60-65%, mild LVH, normal RV size and systolic function.  2. CAD:  LHC 10/16 with 20% proximal LAD, 40% mid LAD, 90% distal LAD, large high OM1 with 80% ostial stenosis => medically managed.  3. Hx of CVA: MRI head 08/28/15 showed small infarcts in watershed region.Pre-dated cardiac cath, likely related to HTN.  4. 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  Now on amiodarone given symptomatic frequent recurrences.   5. HTN:  Renal artery dopplers  08/30/15 did not show RAS.  6. Hyperlipidemia. 7. PFTs (11/16): suggestive of reactive airways disease like asthma.  8. Elevated LFTs: ?etiology.  9. Anemia 10. Depression  Current Outpatient Medications  Medication Sig Dispense Refill  . aspirin EC 81 MG EC tablet Take 1 tablet (81 mg total) by mouth daily. 30 tablet 0  . bisacodyl (DULCOLAX) 5 MG EC tablet Take 1 tablet (5 mg total) by mouth daily as needed for moderate constipation. 30 tablet 0  . carvedilol (COREG) 6.25 MG tablet Take 1 tablet (6.25 mg total) by mouth 2 (two) times daily with a meal. 180 tablet 0  . feeding supplement (BOOST / RESOURCE BREEZE) LIQD Take 1 Container by mouth 2 (two) times daily with a meal.  0  . feeding supplement, ENSURE ENLIVE, (ENSURE ENLIVE) LIQD Take 237 mLs by mouth 2 (two) times daily between meals. 237 mL 12  . flintstones complete (FLINTSTONES) 60 MG chewable tablet Chew 2 tablets by mouth daily.    . rosuvastatin (CRESTOR) 10 MG tablet Take 1 tablet (10 mg total) by mouth daily. 90 tablet 0  . spironolactone (ALDACTONE) 25 MG tablet TAKE 1 TABLET BY MOUTH DAILY. 90 tablet 3  . spironolactone (ALDACTONE) 25 MG tablet Take 1 tablet (25 mg total) by mouth daily. 90 tablet 0   No current facility-administered medications for this encounter.     Allergies  Allergen Reactions  . Demerol [Meperidine] Other (See Comments)    Hallucinations, out of body experience      Social History   Socioeconomic History  . Marital status: Married    Spouse name: Not on file  . Number of children: 4  . Years of education: Bachelors  . Highest education level: Not on file  Occupational History  . 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 Financial resource strain: Not on file  . Food insecurity:    Worry: Not on file    Inability: Not on file  . Transportation needs:    Medical: Not on file    Non-medical: Not on file  Tobacco Use  . Smoking status: Never Smoker  . Smokeless  tobacco: Never Used  Substance and Sexual Activity  . Alcohol use: No  . Drug use: No  . Sexual activity: Never  Lifestyle  . Physical activity:    Days per week: Not on file    Minutes per session: Not on file  . Stress: Not on file  Relationships  . Social connections:    Talks on phone: Not on file    Gets together: Not on file    Attends religious service: Not on file    Active member of club or organization: Not on file    Attends meetings of clubs or organizations: Not on file    Relationship status: Not on file  . Intimate partner violence:    Fear of  current or ex partner: Not on file    Emotionally abused: Not on file    Physically abused: Not on file    Forced sexual activity: Not on file  Other Topics Concern  . Not on file  Social History Narrative   Lives at home with grandson and granddaughter.   Right-handed.   No caffeine use.      Family History  Problem Relation Age of Onset  . Hypertension Daughter   . Other Mother        unsure of medical history-says she passed away from an accident  . Other Father        unsure of medical history-says he passed away from natual causes    Vitals:   01/25/19 1336  BP: (!) 198/102  Pulse: 72  SpO2: 98%  Weight: 44.9 kg (99 lb)    Wt Readings from Last 3 Encounters:  01/25/19 44.9 kg (99 lb)  07/29/17 37.1 kg (81 lb 12.8 oz)  01/18/17 38.8 kg (85 lb 8 oz)    PHYSICAL EXAM: General: NAD Neck: No JVD, no thyromegaly or thyroid nodule.  Lungs: Clear to auscultation bilaterally with normal respiratory effort. CV: Nondisplaced PMI.  Heart regular S1/S2, no S3/S4, no murmur.  No peripheral edema.  No carotid bruit.  Normal pedal pulses.  Abdomen: Soft, nontender, no hepatosplenomegaly, no distention.  Skin: Intact without lesions or rashes.  Neurologic: Alert and oriented x 3.  Psych: Normal affect. Extremities: No clubbing or cyanosis.  HEENT: Normal.   ASSESSMENT & PLAN: 1. Chronic Systolic HF: Mixed  ischemic/nonischemic CMP, suspect long-standing HTN played a large role. ECHO 10/16 EF 20-25% with mild LVH. Repeat echo (1/17) showed EF improved back to normal range, 60-65%.  Echo in 2/18 showed that EF remained normal, 60-65%. She is euvolemic today, NYHA class II.   - Continue current Coreg and spironolactone.  BMET today with spironolactone use.  - I will have her get repeat echo to make sure EF remains normal.  2. CAD: LHC 08/27/15 with 20% proximal LAD, 40% mid LAD, 90% distal LAD, large high OM1 with 80% ostial stenosis, medically managed.  No chest pain.  - Continue ASA 81. - Continue Crestor, check lipids today.    3. CVA: MRI head 08/28/15 showed small infarcts in watershed region.Suspect related to HTN, neurologic symptoms preceded cath.  No residual deficit. 4. H/o SVT with syncope: No symptomatic recurrence. Unable to reproduce with EP study.  - She is now off amiodarone.   5. HTN:  Renal artery dopplers 08/30/15 did not show RAS. BP very high here but good readings from home (checks daily).   - Nursing visit for her to bring in her home cuff and calibrate to our cuff to make sure it is accurate.  - Continue Coreg and spironolactone for now.  6. Elevated LFTs: Resolved.  7. Failure to thrive: This seems to have totally resolved.  She is now doing quite well.  No definite explanation for prior FTT. ?Depression-related.    Followup in 6 months if echo shows that EF remains normal and if we find that her home BP cuff is well-calibrated with the cuff in our office. Marca Ancona MD 01/26/2019

## 2019-01-28 MED FILL — SPIRONOLACTONE 25 MG TABLET: 25 | 90 days supply | Qty: 90 | Fill #0

## 2019-02-11 ENCOUNTER — Encounter (HOSPITAL_COMMUNITY): Payer: Self-pay

## 2019-02-11 ENCOUNTER — Ambulatory Visit (HOSPITAL_COMMUNITY): Payer: Self-pay

## 2019-04-25 ENCOUNTER — Other Ambulatory Visit (HOSPITAL_COMMUNITY): Payer: Self-pay | Admitting: Cardiology

## 2019-04-25 MED FILL — CARVEDILOL 6.25 MG TABLET: 6.25 | 30 days supply | Qty: 60 | Fill #0

## 2019-04-25 MED FILL — SPIRONOLACTONE 25 MG TABLET: 25 | 90 days supply | Qty: 90 | Fill #0

## 2019-05-13 ENCOUNTER — Other Ambulatory Visit (HOSPITAL_COMMUNITY): Payer: Self-pay

## 2019-05-13 MED ORDER — ROSUVASTATIN CALCIUM 10 MG PO TABS: 10.0000 mg | ORAL_TABLET | Freq: Every day | ORAL | 3 refills | Status: DC

## 2019-05-13 MED FILL — ROSUVASTATIN CALCIUM 10 MG: 10 | 90 days supply | Qty: 90 | Fill #0

## 2019-06-01 ENCOUNTER — Other Ambulatory Visit (HOSPITAL_COMMUNITY): Payer: Self-pay | Admitting: Cardiology

## 2019-06-01 MED ORDER — CARVEDILOL 6.25 MG PO TABS: 6.2500 mg | ORAL_TABLET | Freq: Two times a day (BID) | ORAL | 3 refills | Status: DC

## 2019-06-01 MED FILL — CARVEDILOL 6.25 MG TABLET: 6.25 | 90 days supply | Qty: 180 | Fill #0

## 2019-08-01 ENCOUNTER — Other Ambulatory Visit (HOSPITAL_COMMUNITY): Payer: Self-pay | Admitting: Cardiology

## 2019-08-01 MED FILL — SPIRONOLACTONE 25 MG TABLET: 25 | 90 days supply | Qty: 90 | Fill #0

## 2019-08-03 ENCOUNTER — Other Ambulatory Visit (HOSPITAL_COMMUNITY): Payer: Self-pay | Admitting: *Deleted

## 2019-09-07 MED FILL — ROSUVASTATIN CALCIUM 10 MG: 10 | 90 days supply | Qty: 90 | Fill #1

## 2019-11-28 ENCOUNTER — Other Ambulatory Visit (HOSPITAL_COMMUNITY): Payer: Self-pay | Admitting: Cardiology

## 2019-11-28 MED FILL — CARVEDILOL 6.25 MG TABLET: 6.25 | 90 days supply | Qty: 180 | Fill #1

## 2019-11-28 MED FILL — spIRONOLACTONE 25 MG TABS: 25 | 90 days supply | Qty: 90 | Fill #0

## 2020-01-19 MED FILL — ROSUVASTATIN CALCIUM 10 MG: 10 | 90 days supply | Qty: 90 | Fill #2

## 2020-03-12 ENCOUNTER — Other Ambulatory Visit (HOSPITAL_COMMUNITY): Payer: Self-pay | Admitting: Cardiology

## 2020-03-12 MED FILL — SPIRONOLACTONE 25 MG TABS: 25 | 90 days supply | Qty: 90 | Fill #0

## 2020-04-17 MED FILL — CARVEDILOL 6.25 MG TABLET: 6.25 | 90 days supply | Qty: 180 | Fill #2

## 2020-07-30 ENCOUNTER — Telehealth (HOSPITAL_COMMUNITY): Payer: Self-pay | Admitting: Vascular Surgery

## 2020-07-30 MED ORDER — SPIRONOLACTONE 25 MG PO TABS: 25.0000 mg | ORAL_TABLET | Freq: Every day | ORAL | 1 refills | Status: DC

## 2020-07-30 MED ORDER — ROSUVASTATIN CALCIUM 10 MG PO TABS: 10.0000 mg | ORAL_TABLET | Freq: Every day | ORAL | 1 refills | Status: DC

## 2020-07-30 NOTE — Telephone Encounter (Signed)
Pt called to make appt w/ [facility], pt is scheduled 11/2, pt needs refills on Cleda Daub and crestor until she can get in to see [facility]

## 2020-07-30 NOTE — Telephone Encounter (Signed)
done

## 2020-07-31 MED FILL — ROSUVASTATIN CALCIUM 10 MG: 10 | 30 days supply | Qty: 30 | Fill #0

## 2020-07-31 MED FILL — SPIRONOLACTONE 25 MG TABS: 25 | 30 days supply | Qty: 30 | Fill #0

## 2020-09-11 MED FILL — SPIRONOLACTONE 25 MG TABS: 25 | 30 days supply | Qty: 30 | Fill #1

## 2020-09-11 MED FILL — ROSUVASTATIN CALCIUM 10 MG: 10 | 30 days supply | Qty: 30 | Fill #1

## 2020-09-18 ENCOUNTER — Encounter (HOSPITAL_COMMUNITY): Payer: Self-pay | Admitting: Cardiology

## 2020-09-18 ENCOUNTER — Other Ambulatory Visit: Payer: Self-pay

## 2020-09-18 ENCOUNTER — Other Ambulatory Visit (HOSPITAL_COMMUNITY): Payer: Self-pay | Admitting: Cardiology

## 2020-09-18 ENCOUNTER — Ambulatory Visit (HOSPITAL_COMMUNITY)
Admission: RE | Admit: 2020-09-18 | Discharge: 2020-09-18 | Disposition: A | Discharge status: Home / Self Care | Payer: Self-pay | Source: Ambulatory Visit | Attending: Cardiology | Admitting: Cardiology

## 2020-09-18 VITALS — BP 150/80 | HR 75 | Wt 101.0 lb

## 2020-09-18 DIAGNOSIS — R55 Syncope and collapse: Secondary | ICD-10-CM | POA: Insufficient documentation

## 2020-09-18 DIAGNOSIS — I428 Other cardiomyopathies: Secondary | ICD-10-CM | POA: Insufficient documentation

## 2020-09-18 DIAGNOSIS — E785 Hyperlipidemia, unspecified: Secondary | ICD-10-CM

## 2020-09-18 DIAGNOSIS — I110 Hypertensive heart disease with heart failure: Secondary | ICD-10-CM | POA: Insufficient documentation

## 2020-09-18 DIAGNOSIS — Z8673 Personal history of transient ischemic attack (TIA), and cerebral infarction without residual deficits: Secondary | ICD-10-CM | POA: Insufficient documentation

## 2020-09-18 DIAGNOSIS — I471 Supraventricular tachycardia: Secondary | ICD-10-CM | POA: Insufficient documentation

## 2020-09-18 DIAGNOSIS — I2510 Atherosclerotic heart disease of native coronary artery without angina pectoris: Secondary | ICD-10-CM | POA: Insufficient documentation

## 2020-09-18 DIAGNOSIS — I5022 Chronic systolic (congestive) heart failure: Secondary | ICD-10-CM | POA: Insufficient documentation

## 2020-09-18 LAB — BASIC METABOLIC PANEL
Anion gap: 9 (ref 5–15)
BUN: 12 mg/dL (ref 8–23)
CO2: 26 mmol/L (ref 22–32)
Calcium: 10.3 mg/dL (ref 8.9–10.3)
Chloride: 106 mmol/L (ref 98–111)
Creatinine, Ser: 1.1 mg/dL — ABNORMAL HIGH (ref 0.44–1.00)
GFR, Estimated: 50 mL/min — ABNORMAL LOW (ref >60)
Glucose, Bld: 97 mg/dL (ref 70–99)
Potassium: 3.7 mmol/L (ref 3.5–5.1)
Sodium: 141 mmol/L (ref 135–145)

## 2020-09-18 LAB — CBC
HCT: 36 % (ref 36.0–46.0)
Hemoglobin: 10.9 g/dL — ABNORMAL LOW (ref 12.0–15.0)
MCH: 23.1 pg — ABNORMAL LOW (ref 26.0–34.0)
MCHC: 30.3 g/dL (ref 30.0–36.0)
MCV: 76.3 fL — ABNORMAL LOW (ref 80.0–100.0)
Platelets: 163 10*3/uL (ref 150–400)
RBC: 4.72 MIL/uL (ref 3.87–5.11)
RDW: 14.7 % (ref 11.5–15.5)
WBC: 6.3 10*3/uL (ref 4.0–10.5)
nRBC: 0 % (ref 0.0–0.2)

## 2020-09-18 LAB — LIPID PANEL
Cholesterol: 175 mg/dL (ref 0–200)
HDL: 84 mg/dL (ref >40)
LDL Cholesterol: 79 mg/dL (ref 0–99)
Total CHOL/HDL Ratio: 2.1 RATIO
Triglycerides: 61 mg/dL (ref <150)
VLDL: 12 mg/dL (ref 0–40)

## 2020-09-18 MED ORDER — LOSARTAN POTASSIUM 25 MG PO TABS: 25.0000 mg | ORAL_TABLET | Freq: Every day | ORAL | 3 refills | Status: DC

## 2020-09-18 MED FILL — LOSARTAN POTASSIUM 25 MG TA: 25 | 30 days supply | Qty: 30 | Fill #0

## 2020-09-18 NOTE — Progress Notes (Signed)
Patient ID: Julie Stein, female   DOB: 09-Oct-1935, 85 y.o.   MRN: [id number]   PCP: Dr. Darleene Cleaver Primary Cardiologist: Dr Shirlee Latch  HPI: Julie Stein is a 85 y.o.  female with a history of SVT, HTN, CAD, mixed ischemic/nonischemic cardiomyopathy, CHF and stroke. She presents for followup of HTN, CAD, and CHF.  She was admitted initially 08/23/15 with pulmonary edema. Echo on 08/26/15 revealed an EF of 20-25% with diffuse hypokinesis. It was thought she had a viral myocarditis or hypertensive cardiomyopathy. She developed transient weakness and was noted to have CVA by MRI.  She had R/LHC on 10/10 revealing normal to low filling pressures and normal cardiac output, moderate to severe ostial OM1 and severe distal LAD stenoses with plan for medical management. We thought her flash pulmonary edema and troponin elevation was likely from hypertensive crisis leading to demand ischemia. During this time, she was noted to have runs of symptomatic SVT.   Once stable, she was admitted to IP rehab, but then readmitted to hospital with syncope while on bedside commode requiring  CPR.  She had recurrent SVT noted when back on telemetry.  Syncope thought to be 2/2 rapid SVT versus vasovagal.  She had an EP study but SVT could not be triggered so no ablation was done.  Given further recurrences, she was started on amiodarone.  Amiodarone was subsequently stopped.   Echo 1/17 showed EF improved to 60-65%.    She had a significant rise in LFTs in 5/17, apparently had had a "bad cold" around this time.  LFTs were normal by 7/17.  At last office appointment in 2/18, she was doing poorly.  She had lost considerable weight overall. She had been intermittently confused.  She had progressive anemia, followed by hematology who has been concerned for anemia of renal disease.  Profound fatigue, not very active.  She developed difficulty swallowing and pills had to be crushed.  She had to use a walker. ?Depression as cause of  symptoms.    Later in 2/18, she was admitted with syncope.  Workup was negative.  She was started on Keppra for ?subclinical seizures.  She was admitted again in 3/18 with syncope and was found to be orthostatic.  Echo in 2/18 showed EF 60-65%.   She returns for followup of CHF and HTN today. She has been doing well symptomatically. No significant exertional dyspnea or chest pain.  Does housework and gardens with no difficulty.  Weight has been stable.  BP is high today, generally in the 140s systolic when she checks at home. No orthopnea/PND.        ECG (personally reviewed): NSR, normal  Labs (10/16): K 3.7, creatinine 1.19, HCT 30.1 Labs (11/16): K 3.8, creatinine 1.35, LDL 76, HDL 68, TSH normal, LFTs normal Labs (1/17): K 4.3, creatinine 1.35, TSH normal, AST 32, ALT 30, HCT 32.4.  Labs (04/07/16):  AST 123 ALT 197. Bili normal  Labs (6/17): AST 62, ALT 61, K 3.1, creatinine 1.28 Labs (7/17): LFTs normal, K 3.4, creatinine 1.48, TSH normal Labs (8/17): K 4.1, creatinine 1.5 Labs (1/18): K 2.9, creatinine 0.7, hgb 9.4 Labs (3/18): K 4.4, creatinine 0.63 Labs (9/18): K 4.5, creatinine 0.94 Labs (3/20): K 4.1, creatinine 1.06, LDL 70, HDL 91  PMH 1. Chronic systolic CHF: Mixed ischemic/nonischemic CMP.  Suspect long-standing HTN plays a major role.  Echo (10/16) with EF 20-25%, mild LVH, mild MR. Echo (1/17) with EF 60-65%, mild TR, severe LAE.  - Echo (2/18): EF 60-65%, mild  LVH, normal RV size and systolic function.  2. CAD:  LHC 10/16 with 20% proximal LAD, 40% mid LAD, 90% distal LAD, large high OM1 with 80% ostial stenosis => medically managed.  3. Hx of CVA: MRI head 08/28/15 showed small infarcts in watershed region.Pre-dated cardiac cath, likely related to HTN.  4. 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  Now on amiodarone given symptomatic frequent recurrences.   5. HTN:  Renal artery dopplers 08/30/15 did not show RAS.   6. Hyperlipidemia. 7. PFTs (11/16): suggestive of reactive airways disease like asthma.  8. Elevated LFTs: ?etiology.  9. Anemia 10. Depression  Current Outpatient Medications  Medication Sig Dispense Refill  . aspirin EC 81 MG EC tablet Take 1 tablet (81 mg total) by mouth daily. 30 tablet 0  . carvedilol (COREG) 6.25 MG tablet Take 1 tablet (6.25 mg total) by mouth 2 (two) times daily with a meal. 180 tablet 3  . Coenzyme Q-10 200 MG CAPS Take 200 mg by mouth 1 day or 1 dose.    . Glucosamine-Chondroit-Vit C-Mn (GLUCOSAMINE CHONDR 1500 COMPLX) CAPS Take 2 capsules by mouth daily.    . Misc Natural Products (LEG VEIN & CIRCULATION) TABS Take 1 capsule by mouth 1 day or 1 dose.    . Multiple Vitamin (MULTIVITAMIN) capsule Take 1 capsule by mouth daily.    . multivitamin-lutein (OCUVITE-LUTEIN) CAPS capsule Take 1 capsule by mouth daily. Zeaxanthin    . omega-3 acid ethyl esters (LOVAZA) 1 g capsule Take by mouth daily.    . rosuvastatin (CRESTOR) 10 MG tablet Take 1 tablet (10 mg total) by mouth daily. 30 tablet 1  . spironolactone (ALDACTONE) 25 MG tablet TAKE 1 TABLET BY MOUTH DAILY. 90 tablet 3  . spironolactone (ALDACTONE) 25 MG tablet Take 1 tablet (25 mg total) by mouth daily. Needs appt for further refills 30 tablet 1  . losartan (COZAAR) 25 MG tablet Take 1 tablet (25 mg total) by mouth daily. 30 tablet 3   No current facility-administered medications for this encounter.    Allergies  Allergen Reactions  . Demerol [Meperidine] Other (See Comments)    Hallucinations, out of body experience      Social History   Socioeconomic History  . Marital status: Married    Spouse name: Not on file  . Number of children: 4  . Years of education: Bachelors  . Highest education level: Not on file  Occupational History  . 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 Smoking status: Never Smoker  . Smokeless tobacco: Never Used  Substance and Sexual Activity  .  Alcohol use: No  . Drug use: No  . Sexual activity: Never  Other Topics Concern  . Not on file  Social History Narrative   Lives at home with grandson and granddaughter.   Right-handed.   No caffeine use.   Social Determinants of Health   Financial Resource Strain:   . Difficulty of Paying Living Expenses: Not on file  Food Insecurity:   . Worried About Programme researcher, broadcasting/film/video in the Last Year: Not on file  . Ran Out of Food in the Last Year: Not on file  Transportation Needs:   . Lack of Transportation (Medical): Not on file  . Lack of Transportation (Non-Medical): Not on file  Physical Activity:   . Days of Exercise per Week: Not on file  . Minutes  of Exercise per Session: Not on file  Stress:   . Feeling of Stress : Not on file  Social Connections:   . Frequency of Communication with Friends and Family: Not on file  . Frequency of Social Gatherings with Friends and Family: Not on file  . Attends Religious Services: Not on file  . Active Member of Clubs or Organizations: Not on file  . Attends Banker Meetings: Not on file  . Marital Status: Not on file  Intimate Partner Violence:   . Fear of Current or Ex-Partner: Not on file  . Emotionally Abused: Not on file  . Physically Abused: Not on file  . Sexually Abused: Not on file      Family History  Problem Relation Age of Onset  . Hypertension Daughter   . Other Mother        unsure of medical history-says she passed away from an accident  . Other Father        unsure of medical history-says he passed away from natual causes    Vitals:   09/18/20 1044  BP: (!) 150/80  Pulse: 75  SpO2: 99%  Weight: 45.8 kg (101 lb)    Wt Readings from Last 3 Encounters:  09/18/20 45.8 kg (101 lb)  01/25/19 44.9 kg (99 lb)  07/29/17 37.1 kg (81 lb 12.8 oz)    PHYSICAL EXAM: General: NAD Neck: No JVD, no thyromegaly or thyroid nodule.  Lungs: Clear to auscultation bilaterally with normal respiratory effort. CV:  Nondisplaced PMI.  Heart regular S1/S2, no S3/S4, no murmur.  Trace ankle edema.  No carotid bruit.  Normal pedal pulses.  Abdomen: Soft, nontender, no hepatosplenomegaly, no distention.  Skin: Intact without lesions or rashes.  Neurologic: Alert and oriented x 3.  Psych: Normal affect. Extremities: No clubbing or cyanosis.  HEENT: Normal.   ASSESSMENT & PLAN: 1. Chronic Systolic HF => recovered: Mixed ischemic/nonischemic CMP, suspect long-standing HTN played a large role. ECHO 10/16 EF 20-25% with mild LVH. Repeat echo (1/17) showed EF improved back to normal range, 60-65%.  Echo in 2/18 showed that EF remained normal, 60-65%. She is euvolemic today, NYHA class II.   - Continue current Coreg and spironolactone.  BMET today and will need every 3 months with spironolactone use.  - I will have her get repeat echo to make sure EF remains normal.  2. CAD: LHC 08/27/15 with 20% proximal LAD, 40% mid LAD, 90% distal LAD, large high OM1 with 80% ostial stenosis, medically managed.  No chest pain.  - Continue ASA 81. - Continue Crestor, check lipids today.   3. CVA: MRI head 08/28/15 showed small infarcts in watershed region.Suspect related to HTN, neurologic symptoms preceded cath.  No residual deficit. 4. H/o SVT with syncope: No symptomatic recurrence. Unable to reproduce with EP study.  - She is now off amiodarone.   5. HTN:  Renal artery dopplers 08/30/15 did not show RAS. BP elevated. - Continue Coreg and spironolactone.  - Add losartan 25 mg daily with BMET in 2 wks.  - She will record BP readings on losartan to bring to followup.   Followup with HF pharmacist in 3 wks for BP med titration, see me in 6 months.    Marca Ancona MD 09/18/2020

## 2020-09-18 NOTE — Patient Instructions (Addendum)
START Losartan 25mg  Daily  Once you start Losartan, please check your blood pressure every day for 2 weeks and record in notebook.  Labs done today, your results will be available in MyChart, we will contact you for abnormal readings.  Your physician has requested that you have an echocardiogram. Echocardiography is a painless test that uses sound waves to create images of your heart. It provides your doctor with information about the size and shape of your heart and how well your heart's chambers and valves are working. This procedure takes approximately one hour. There are no restrictions for this procedure.  Your physician recommends that you schedule repeat labs in 2 weeks  Your physician recommends that you return for a Pharmacist appointment in 3 weeks.  Please call our office in May 2022 to schedule your follow up appointment  If you have any questions or concerns before your next appointment please send June 2022 a message through [place] or call our office at [phone].    TO LEAVE A MESSAGE FOR THE NURSE SELECT OPTION 2, PLEASE LEAVE A MESSAGE INCLUDING: . YOUR NAME . DATE OF BIRTH . CALL BACK NUMBER . REASON FOR CALL**this is important as we prioritize the call backs  YOU WILL RECEIVE A CALL BACK THE SAME DAY AS LONG AS YOU CALL BEFORE 4:00 PM

## 2020-09-25 ENCOUNTER — Other Ambulatory Visit: Payer: Self-pay

## 2020-09-25 ENCOUNTER — Ambulatory Visit (HOSPITAL_COMMUNITY)
Admission: RE | Admit: 2020-09-25 | Discharge: 2020-09-25 | Disposition: A | Discharge status: Home / Self Care | Payer: Self-pay | Source: Ambulatory Visit | Attending: Family Medicine | Admitting: Family Medicine

## 2020-09-25 DIAGNOSIS — I5022 Chronic systolic (congestive) heart failure: Secondary | ICD-10-CM | POA: Insufficient documentation

## 2020-09-25 DIAGNOSIS — I110 Hypertensive heart disease with heart failure: Secondary | ICD-10-CM | POA: Insufficient documentation

## 2020-09-25 DIAGNOSIS — I2510 Atherosclerotic heart disease of native coronary artery without angina pectoris: Secondary | ICD-10-CM | POA: Insufficient documentation

## 2020-09-25 LAB — ECHOCARDIOGRAM COMPLETE
Area-P 1/2: 2.45 cm2
Calc EF: 57.1 %
S' Lateral: 2.5 cm
Single Plane A2C EF: 54 %
Single Plane A4C EF: 56.3 %

## 2020-09-25 NOTE — Progress Notes (Signed)
  Echocardiogram 2D Echocardiogram has been performed.  Julie Stein 09/25/2020, 11:06 AM

## 2020-10-02 ENCOUNTER — Other Ambulatory Visit: Payer: Self-pay

## 2020-10-02 ENCOUNTER — Ambulatory Visit (HOSPITAL_COMMUNITY)
Admission: RE | Admit: 2020-10-02 | Discharge: 2020-10-02 | Disposition: A | Discharge status: Home / Self Care | Payer: Self-pay | Source: Ambulatory Visit | Attending: Family Medicine | Admitting: Family Medicine

## 2020-10-02 ENCOUNTER — Other Ambulatory Visit (HOSPITAL_COMMUNITY): Payer: Self-pay | Admitting: Internal Medicine

## 2020-10-02 MED FILL — CARVEDILOL 6.25 MG TABLET: 6.25 | 90 days supply | Qty: 180 | Fill #0

## 2020-10-16 ENCOUNTER — Other Ambulatory Visit (HOSPITAL_COMMUNITY): Payer: Self-pay | Admitting: Cardiology

## 2020-10-16 ENCOUNTER — Ambulatory Visit (HOSPITAL_COMMUNITY)
Admission: RE | Admit: 2020-10-16 | Discharge: 2020-10-16 | Disposition: A | Discharge status: Home / Self Care | Payer: Self-pay | Source: Ambulatory Visit | Attending: Pharmacist | Admitting: Pharmacist

## 2020-10-16 ENCOUNTER — Other Ambulatory Visit: Payer: Self-pay

## 2020-10-16 VITALS — BP 160/82 | HR 80 | Wt 98.4 lb

## 2020-10-16 DIAGNOSIS — Z7982 Long term (current) use of aspirin: Secondary | ICD-10-CM | POA: Insufficient documentation

## 2020-10-16 DIAGNOSIS — I428 Other cardiomyopathies: Secondary | ICD-10-CM | POA: Insufficient documentation

## 2020-10-16 DIAGNOSIS — I2510 Atherosclerotic heart disease of native coronary artery without angina pectoris: Secondary | ICD-10-CM | POA: Insufficient documentation

## 2020-10-16 DIAGNOSIS — I110 Hypertensive heart disease with heart failure: Secondary | ICD-10-CM | POA: Insufficient documentation

## 2020-10-16 DIAGNOSIS — Z8673 Personal history of transient ischemic attack (TIA), and cerebral infarction without residual deficits: Secondary | ICD-10-CM | POA: Insufficient documentation

## 2020-10-16 DIAGNOSIS — I5022 Chronic systolic (congestive) heart failure: Secondary | ICD-10-CM | POA: Insufficient documentation

## 2020-10-16 LAB — BASIC METABOLIC PANEL
Anion gap: 8 (ref 5–15)
BUN: 17 mg/dL (ref 8–23)
CO2: 26 mmol/L (ref 22–32)
Calcium: 10.4 mg/dL — ABNORMAL HIGH (ref 8.9–10.3)
Chloride: 109 mmol/L (ref 98–111)
Creatinine, Ser: 1.21 mg/dL — ABNORMAL HIGH (ref 0.44–1.00)
GFR, Estimated: 44 mL/min — ABNORMAL LOW (ref >60)
Glucose, Bld: 107 mg/dL — ABNORMAL HIGH (ref 70–99)
Potassium: 5.1 mmol/L (ref 3.5–5.1)
Sodium: 143 mmol/L (ref 135–145)

## 2020-10-16 MED ORDER — SPIRONOLACTONE 25 MG PO TABS
25.0000 mg | ORAL_TABLET | Freq: Every day | ORAL | 3 refills | Status: DC
Start: 2020-10-16 — End: 2020-10-16

## 2020-10-16 MED FILL — ROSUVASTATIN CALCIUM 10 MG: 10 | 30 days supply | Qty: 30 | Fill #0

## 2020-10-16 MED FILL — SPIRONOLACTONE 25 MG TABS: 25 | 90 days supply | Qty: 90 | Fill #0

## 2020-10-16 MED FILL — LOSARTAN POTASSIUM 25 MG TA: 25 | 30 days supply | Qty: 30 | Fill #1

## 2020-10-16 NOTE — Progress Notes (Signed)
PCP: Dr. Darleene Cleaver Primary Cardiologist: Dr Shirlee Latch  HPI:  Julie Stein is a 85 y.o.  female with a history of SVT, HTN, CAD, mixed ischemic/nonischemic cardiomyopathy, CHF and stroke. She was admitted initially 08/23/15 with pulmonary edema. Echo on 08/26/15 revealed an EF of 20-25% with diffuse hypokinesis. It was thought she had a viral myocarditis or hypertensive cardiomyopathy. She developed transient weakness and was noted to have CVA by MRI. She had R/LHC on 10/10 revealing normal to low filling pressures and normal cardiac output, moderate to severe ostial OM1 and severe distal LAD stenoses with plan for medical management. We thought her flash pulmonary edema and troponin elevation was likely from hypertensive crisis leading to demand ischemia. During this time, she was noted to have runs of symptomatic SVT.   Once stable, she was admitted to IP rehab, but then readmitted to the hospital with syncope while on bedside commode requiring CPR.  She had recurrent SVT noted when back on telemetry.  Syncope thought to be 2/2 rapid SVT versus vasovagal.  She had an EP study but SVT could not be triggered so no ablation was done.  Given further recurrences, she was started on amiodarone.  Amiodarone was subsequently stopped.   Echo 1/17 showed EF improved to 60-65%.    She had a significant rise in LFTs in 5/17, apparently had a "bad cold" around this time. LFTs were normal by 7/17.  At office appointment in 2/18, she was doing poorly.  She had lost considerable weight overall. She had been intermittently confused.  She had progressive anemia and was followed by hematology who had been concerned for anemia of renal disease.  Had profound fatigue and was not very active.  She developed difficulty swallowing and pills had to be crushed.  She had to use a walker. Questionable if depression was cause of symptoms.    ECHO 09/25/20 showed LVEF 55-60% with normal RV function.  Later in 2/18, she was  admitted with syncope.  Workup was negative.  She was started on Keppra for possible subclinical seizures.  She was admitted again in 3/18 with syncope and was found to be orthostatic.  Echo in 2/18 showed EF 60-65%.   Recently returned for followup of CHF and HTN with Dr. Shirlee Latch on 09/18/20. She was doing well symptomatically. No significant exertional dyspnea or chest pain.  Completed housework and gardens with no difficulty.  Weight had been stable.  BP was high, generally in the 140s systolic when she checks at home. No orthopnea/PND.         Today she returns to HF clinic for pharmacist medication titration. At last visit with MD losartan 25 mg daily was started. She is feeling well overall. Denies any adverse events to losartan and is tolerating it well. BP in office is still high, but she reports that she has had white coat hypertension for years and that this is normal. Her home BP ranges 100-120s/60s with HR in 60s. Denies lightheadedness, dizziness, or fatigue. No CP or palpitations. Breathing is good. She stays active at home and can walk 15 minutes without feeling short of breath. Reports a good appetite and follows low-salt diet.   HF Medications: Carvedilol 6.25 mg BID Losartan 25 mg daily Spironolactone 25 mg daily  Has the patient been experiencing any side effects to the medications prescribed?  no  Does the patient have any problems obtaining medications due to transportation or finances?   no  Understanding of regimen: excellent Understanding of indications: good  Potential of compliance: good Patient understands to avoid NSAIDs. Patient understands to avoid decongestants.    Pertinent Lab Values: . Serum creatinine 1.21, BUN 17, Potassium 5.1, Sodium 143  Vital Signs: . Weight: 98.4 lbs (last clinic weight: 101 lbs) . Blood pressure: 160/82  . Heart rate: 80   Assessment: 1. Chronic Systolic HF => recovered: Mixed ischemic/nonischemic CMP, suspect long-standing HTN  played a large role. ECHO 10/16 EF 20-25% with mild LVH. Repeat echo (1/17) showed EF improved back to normal range, 60-65%.  Echo in 2/18 showed that EF remained normal, 60-65%.  - She is euvolemic today, NYHA class II. BP today is elevated in setting of white coat hypertension. Home BP readings well controlled. - Labs: Scr 1.21, K 5.1. Potassium slightly elevated on BMET today. Patient is not taking additional potassium supplements. Educated patient to follow a low-potassium diet. - Continue spironolactone 25 mg daily. Refills sent to pharmacy. - Continue carvedilol 6.25 mg BID - Continue losartan 25 mg daily.  - Educated patient to continue measuring BP at home and to call if home BP increases.  2. CAD: LHC 08/27/15 with 20% proximal LAD, 40% mid LAD, 90% distal LAD, large high OM1 with 80% ostial stenosis, medically managed. LDL 79 on 09/18/20. No chest pain.  - Continue aspirin 81 mg daily - Continue rosuvastatin 10 mg daily    3. CVA: MRI head 08/28/15 showed small infarcts in watershed region.Suspect related to HTN, neurologic symptoms preceded cath.  No residual deficit.  4. H/o SVT with syncope: No symptomatic recurrence. Unable to reproduce with EP study.  - She is now off amiodarone.    5. HTN:  Renal artery dopplers 08/30/15 did not show RAS. BP elevated in setting of white coat HTN. Home BP well-controlled. - Continue carvedilol 6.25 mg BID - Continue losartan 25 mg daily - Continue spironolactone 25 mg daily   Plan: 1) Medication changes: Based on clinical presentation, vital signs and recent labs will not make medication changes today and start following a low-potassium diet. 2) Follow-up in 6 months with Dr. Coral Else, PharmD, BCPS PGY2 Cardiology Pharmacy Resident  Karle Plumber, PharmD, BCPS, [facility], CPP [facility] Clinic Pharmacist [phone]

## 2020-10-16 NOTE — Patient Instructions (Signed)
It was a pleasure seeing you today!  MEDICATIONS: -No medication changes today -Call if you have questions about your medications.  LABS: -We will call you if your labs need attention.  NEXT APPOINTMENT: Return to clinic in June 2022 with Dr. Shirlee Latch. Please call the clinic at [phone] 4-6 weeks prior to schedule your appointment.   In general, to take care of your heart failure: -Limit your fluid intake to 2 Liters (half-gallon) per day.   -Limit your salt intake to ideally 2-3 grams (2000-3000 mg) per day. -Weigh yourself daily and record, and bring that "weight diary" to your next appointment.  (Weight gain of 2-3 pounds in 1 day typically means fluid weight.) -The medications for your heart are to help your heart and help you live longer.   -Please contact us before stopping any of your heart medications.  Call the clinic at [phone] with questions or to reschedule future appointments.

## 2020-11-23 MED FILL — LOSARTAN POTASSIUM 25 MG TA: 25 | 30 days supply | Qty: 30 | Fill #2

## 2020-11-23 MED FILL — ROSUVASTATIN CALCIUM 10 MG: 10 | 30 days supply | Qty: 30 | Fill #1

## 2020-12-19 ENCOUNTER — Other Ambulatory Visit (HOSPITAL_COMMUNITY): Payer: Self-pay | Admitting: *Deleted

## 2020-12-19 NOTE — Telephone Encounter (Signed)
I received a VM that pt needed a call back about a refill. I called pt back pt did not answer/no vm.

## 2020-12-27 MED FILL — LOSARTAN POTASSIUM 25 MG TA: 25 | 30 days supply | Qty: 30 | Fill #3

## 2021-01-18 ENCOUNTER — Other Ambulatory Visit (HOSPITAL_COMMUNITY): Payer: Self-pay | Admitting: Cardiology

## 2021-01-18 MED FILL — SPIRONOLACTONE 25 MG TABS: 25 | 90 days supply | Qty: 90 | Fill #1

## 2021-01-18 MED FILL — ROSUVASTATIN CALCIUM 10 MG: 10 | 30 days supply | Qty: 30 | Fill #0

## 2021-01-25 ENCOUNTER — Other Ambulatory Visit (HOSPITAL_COMMUNITY): Payer: Self-pay | Admitting: Cardiology

## 2021-01-25 ENCOUNTER — Other Ambulatory Visit (HOSPITAL_COMMUNITY): Payer: Self-pay | Admitting: Internal Medicine

## 2021-01-25 MED FILL — LOSARTAN POTASSIUM 25 MG TA: 25 | 30 days supply | Qty: 30 | Fill #0

## 2021-01-25 MED FILL — CARVEDILOL 6.25 MG TABLET: 6.25 | 90 days supply | Qty: 180 | Fill #1

## 2021-02-27 ENCOUNTER — Other Ambulatory Visit (HOSPITAL_COMMUNITY): Payer: Self-pay

## 2021-02-27 MED FILL — Rosuvastatin Calcium Tab 10 MG: ORAL | 30 days supply | Qty: 30 | Fill #0 | Status: AC

## 2021-02-27 MED FILL — Losartan Potassium Tab 25 MG: ORAL | 30 days supply | Qty: 30 | Fill #0 | Status: AC

## 2021-04-01 ENCOUNTER — Other Ambulatory Visit (HOSPITAL_COMMUNITY): Payer: Self-pay | Admitting: Cardiology

## 2021-04-01 ENCOUNTER — Other Ambulatory Visit (HOSPITAL_COMMUNITY): Payer: Self-pay

## 2021-04-01 MED FILL — Losartan Potassium Tab 25 MG: ORAL | 30 days supply | Qty: 30 | Fill #1 | Status: AC

## 2021-04-02 ENCOUNTER — Other Ambulatory Visit (HOSPITAL_COMMUNITY): Payer: Self-pay

## 2021-04-03 ENCOUNTER — Other Ambulatory Visit (HOSPITAL_COMMUNITY): Payer: Self-pay

## 2021-04-04 ENCOUNTER — Other Ambulatory Visit (HOSPITAL_COMMUNITY): Payer: Self-pay

## 2021-04-04 ENCOUNTER — Other Ambulatory Visit (HOSPITAL_COMMUNITY): Payer: Self-pay | Admitting: Cardiology

## 2021-04-05 ENCOUNTER — Other Ambulatory Visit (HOSPITAL_COMMUNITY): Payer: Self-pay

## 2021-04-05 MED ORDER — ROSUVASTATIN CALCIUM 10 MG PO TABS
ORAL_TABLET | Freq: Every day | ORAL | 1 refills | Status: DC
  Filled 2021-04-05: qty 30, 30d supply, fill #0
  Filled 2021-05-08: qty 30, 30d supply, fill #1

## 2021-04-08 ENCOUNTER — Other Ambulatory Visit (HOSPITAL_COMMUNITY): Payer: Self-pay

## 2021-05-08 ENCOUNTER — Other Ambulatory Visit (HOSPITAL_COMMUNITY): Payer: Self-pay

## 2021-05-08 MED FILL — Losartan Potassium Tab 25 MG: ORAL | 30 days supply | Qty: 30 | Fill #2 | Status: AC

## 2021-05-08 MED FILL — Spironolactone Tab 25 MG: ORAL | 90 days supply | Qty: 90 | Fill #0 | Status: AC

## 2021-05-28 ENCOUNTER — Other Ambulatory Visit (HOSPITAL_COMMUNITY): Payer: Self-pay

## 2021-05-28 MED FILL — Carvedilol Tab 6.25 MG: ORAL | 90 days supply | Qty: 180 | Fill #0 | Status: AC

## 2021-06-06 ENCOUNTER — Other Ambulatory Visit (HOSPITAL_COMMUNITY): Payer: Self-pay

## 2021-06-06 ENCOUNTER — Other Ambulatory Visit (HOSPITAL_COMMUNITY): Payer: Self-pay | Admitting: Cardiology

## 2021-06-06 ENCOUNTER — Other Ambulatory Visit (HOSPITAL_COMMUNITY): Payer: Self-pay | Admitting: Internal Medicine

## 2021-06-06 MED ORDER — ROSUVASTATIN CALCIUM 10 MG PO TABS
10.0000 mg | ORAL_TABLET | Freq: Every day | ORAL | 1 refills | Status: DC
Start: 2021-06-06 — End: 2021-07-19
  Filled 2021-06-06: qty 60, 60d supply, fill #0

## 2021-06-06 MED ORDER — LOSARTAN POTASSIUM 25 MG PO TABS
25.0000 mg | ORAL_TABLET | Freq: Every day | ORAL | 1 refills | Status: DC
Start: 2021-06-06 — End: 2021-10-14
  Filled 2021-06-06: qty 60, 60d supply, fill #0
  Filled 2021-08-20: qty 60, 60d supply, fill #1

## 2021-07-11 ENCOUNTER — Ambulatory Visit (HOSPITAL_COMMUNITY)
Admission: RE | Admit: 2021-07-11 | Discharge: 2021-07-11 | Disposition: A | Discharge status: Home / Self Care | Payer: Self-pay | Source: Ambulatory Visit | Attending: Cardiology | Admitting: Cardiology

## 2021-07-11 ENCOUNTER — Encounter (HOSPITAL_COMMUNITY): Payer: Self-pay | Admitting: Cardiology

## 2021-07-11 ENCOUNTER — Other Ambulatory Visit: Payer: Self-pay

## 2021-07-11 VITALS — BP 166/88 | HR 74 | Wt 97.6 lb

## 2021-07-11 DIAGNOSIS — I471 Supraventricular tachycardia: Secondary | ICD-10-CM | POA: Insufficient documentation

## 2021-07-11 DIAGNOSIS — Z8673 Personal history of transient ischemic attack (TIA), and cerebral infarction without residual deficits: Secondary | ICD-10-CM | POA: Insufficient documentation

## 2021-07-11 DIAGNOSIS — I2510 Atherosclerotic heart disease of native coronary artery without angina pectoris: Secondary | ICD-10-CM | POA: Insufficient documentation

## 2021-07-11 DIAGNOSIS — Z7982 Long term (current) use of aspirin: Secondary | ICD-10-CM | POA: Insufficient documentation

## 2021-07-11 DIAGNOSIS — I428 Other cardiomyopathies: Secondary | ICD-10-CM | POA: Insufficient documentation

## 2021-07-11 DIAGNOSIS — Z79899 Other long term (current) drug therapy: Secondary | ICD-10-CM | POA: Insufficient documentation

## 2021-07-11 DIAGNOSIS — Z8249 Family history of ischemic heart disease and other diseases of the circulatory system: Secondary | ICD-10-CM | POA: Insufficient documentation

## 2021-07-11 DIAGNOSIS — R7989 Other specified abnormal findings of blood chemistry: Secondary | ICD-10-CM | POA: Insufficient documentation

## 2021-07-11 DIAGNOSIS — E785 Hyperlipidemia, unspecified: Secondary | ICD-10-CM | POA: Insufficient documentation

## 2021-07-11 DIAGNOSIS — I110 Hypertensive heart disease with heart failure: Secondary | ICD-10-CM | POA: Insufficient documentation

## 2021-07-11 DIAGNOSIS — I5022 Chronic systolic (congestive) heart failure: Secondary | ICD-10-CM | POA: Insufficient documentation

## 2021-07-11 DIAGNOSIS — Z885 Allergy status to narcotic agent status: Secondary | ICD-10-CM | POA: Insufficient documentation

## 2021-07-11 LAB — BASIC METABOLIC PANEL
Anion gap: 7 (ref 5–15)
BUN: 14 mg/dL (ref 8–23)
CO2: 26 mmol/L (ref 22–32)
Calcium: 10.4 mg/dL — ABNORMAL HIGH (ref 8.9–10.3)
Chloride: 106 mmol/L (ref 98–111)
Creatinine, Ser: 1.25 mg/dL — ABNORMAL HIGH (ref 0.44–1.00)
GFR, Estimated: 43 mL/min — ABNORMAL LOW (ref >60)
Glucose, Bld: 96 mg/dL (ref 70–99)
Potassium: 3.9 mmol/L (ref 3.5–5.1)
Sodium: 139 mmol/L (ref 135–145)

## 2021-07-11 LAB — LIPID PANEL
Cholesterol: 181 mg/dL (ref 0–200)
HDL: 80 mg/dL (ref >40)
LDL Cholesterol: 85 mg/dL (ref 0–99)
Total CHOL/HDL Ratio: 2.3 RATIO
Triglycerides: 81 mg/dL (ref <150)
VLDL: 16 mg/dL (ref 0–40)

## 2021-07-11 NOTE — Progress Notes (Signed)
Patient ID: Julie Stein, female   DOB: 12/03/1934, 86 y.o.   MRN: [id number]   PCP: Dr. Darleene Cleaver Primary Cardiologist: Dr Shirlee Latch  HPI: Julie Stein is a 86 y.o.  female with a history of SVT, HTN, CAD, mixed ischemic/nonischemic cardiomyopathy, CHF and stroke.  She presents for followup of HTN, CAD, and CHF.  She was admitted initially 08/23/15 with pulmonary edema.  Echo on 08/26/15 revealed an EF of 20-25% with diffuse hypokinesis.  It was thought she had a viral myocarditis or hypertensive cardiomyopathy.  She developed transient weakness and was noted to have CVA by MRI.  She had R/LHC on 10/10 revealing normal to low filling pressures and normal cardiac output, moderate to severe ostial OM1 and severe distal LAD stenoses with plan for medical management. We thought her flash pulmonary edema and troponin elevation was likely from hypertensive crisis leading to demand ischemia.  During this time, she was noted to have runs of symptomatic SVT.      Once stable, she was admitted to IP rehab, but then readmitted to hospital with syncope while on bedside commode requiring  CPR.  She had recurrent SVT noted when back on telemetry.  Syncope thought to be 2/2 rapid SVT versus vasovagal.  She had an EP study but SVT could not be triggered so no ablation was done.  Given further recurrences, she was started on amiodarone.  Amiodarone was subsequently stopped.   Echo 1/17 showed EF improved to 60-65%.    She had a significant rise in LFTs in 5/17, apparently had had a "bad cold" around this time.  LFTs were normal by 7/17.  At last office appointment in 2/18, she was doing poorly.  She had lost considerable weight overall. She had been intermittently confused.  She had progressive anemia, followed by hematology who has been concerned for anemia of renal disease.  Profound fatigue, not very active.  She developed difficulty swallowing and pills had to be crushed.  She had to use a walker. ?Depression as cause of  symptoms.    Later in 2/18, she was admitted with syncope.  Workup was negative.  She was started on Keppra for ?subclinical seizures.  She was admitted again in 3/18 with syncope and was found to be orthostatic.  Echo in 2/18 showed EF 60-65%.   Echo in 11/21 showed EF 55-60%, normal RV.   She returns for followup of CHF and HTN today.  BP is elevated today but SBP running 110s-120s when she checks at home (checks daily).  She is feeling great, works out in the yard and does all the housework.  No lightheadedness or falls.  No significant dyspnea.  No chest pain.        ECG (personally reviewed): NSR, normal  Labs (10/16): K 3.7, creatinine 1.19, HCT 30.1 Labs (11/16): K 3.8, creatinine 1.35, LDL 76, HDL 68, TSH normal, LFTs normal Labs (1/17): K 4.3, creatinine 1.35, TSH normal, AST 32, ALT 30, HCT 32.4.  Labs (04/07/16):  AST 123 ALT 197. Bili normal  Labs (6/17): AST 62, ALT 61, K 3.1, creatinine 1.28 Labs (7/17): LFTs normal, K 3.4, creatinine 1.48, TSH normal Labs (8/17): K 4.1, creatinine 1.5 Labs (1/18): K 2.9, creatinine 0.7, hgb 9.4 Labs (3/18): K 4.4, creatinine 0.63 Labs (9/18): K 4.5, creatinine 0.94 Labs (3/20): K 4.1, creatinine 1.06, LDL 70, HDL 91 Labs (11/21): K 5.1, creatinine 1.21, LDL 79  PMH 1. Chronic systolic CHF: Mixed ischemic/nonischemic CMP.  Suspect long-standing HTN plays a major  role.  Echo (10/16) with EF 20-25%, mild LVH, mild MR. Echo (1/17) with EF 60-65%, mild TR, severe LAE.  - Echo (2/18): EF 60-65%, mild LVH, normal RV size and systolic function.  - Echo (11/21): EF 55-60%, normal RV.  2. CAD:  LHC 10/16 with 20% proximal LAD, 40% mid LAD, 90% distal LAD, large high OM1 with 80% ostial stenosis => medically managed.  3. Hx of CVA: MRI head 08/28/15 showed small infarcts in watershed region. Pre-dated cardiac cath, likely related to HTN.  4. 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  Now on  amiodarone given symptomatic frequent recurrences.   5. HTN:  Renal artery dopplers 08/30/15 did not show RAS.   6. Hyperlipidemia. 7. PFTs (11/16): suggestive of reactive airways disease like asthma.  8. Elevated LFTs: ?etiology.  9. Anemia 10. Depression  Current Outpatient Medications  Medication Sig Dispense Refill   aspirin EC 81 MG EC tablet Take 1 tablet (81 mg total) by mouth daily. 30 tablet 0   carvedilol (COREG) 6.25 MG tablet TAKE 1 TABLET BY MOUTH TWICE DAILY WITH A MEAL. 180 tablet 3   Coenzyme Q-10 200 MG CAPS Take 200 mg by mouth 1 day or 1 dose.     Glucosamine-Chondroit-Vit C-Mn (GLUCOSAMINE CHONDR 1500 COMPLX) CAPS Take 2 capsules by mouth daily.     losartan (COZAAR) 25 MG tablet Take 1 tablet (25 mg total) by mouth daily. 60 tablet 1   Misc Natural Products (LEG VEIN & CIRCULATION) TABS Take 1 capsule by mouth 1 day or 1 dose.     Multiple Vitamin (MULTIVITAMIN) capsule Take 1 capsule by mouth daily.     multivitamin-lutein (OCUVITE-LUTEIN) CAPS capsule Take 1 capsule by mouth daily. Zeaxanthin     omega-3 acid ethyl esters (LOVAZA) 1 g capsule Take by mouth daily.     rosuvastatin (CRESTOR) 10 MG tablet Take 1 tablet (10 mg total) by mouth daily. 60 tablet 1   spironolactone (ALDACTONE) 25 MG tablet TAKE 1 TABLET (25 MG TOTAL) BY MOUTH DAILY. 90 tablet 3   No current facility-administered medications for this encounter.    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   Right-handed.   No caffeine use.   Social Determinants of Health   Financial Resource Strain: Not on file  Food Insecurity: Not on file  Transportation Needs: Not on file  Physical Activity: Not on file  Stress: Not on file  Social Connections: Not on file  Intimate Partner Violence: Not on file      Family History  Problem Relation Age of Onset   Hypertension Daughter    Other Mother        unsure of medical history-says she  passed away from an accident   Other Father        unsure of medical history-says he passed away from natual causes    Vitals:   07/11/21 1100  BP: (!) 166/88  Pulse: 74  SpO2: 97%  Weight: 44.3 kg (97 lb 9.6 oz)    Wt Readings from Last 3 Encounters:  07/11/21 44.3 kg (97 lb 9.6 oz)  10/16/20 44.6 kg (98 lb 6.4 oz)  09/18/20 45.8 kg (101 lb)    PHYSICAL EXAM: General: NAD Neck: No JVD, no thyromegaly or thyroid nodule.  Lungs: Clear to auscultation bilaterally with normal respiratory effort. CV: Nondisplaced PMI.  Heart regular S1/S2, no S3/S4, no murmur.  No peripheral edema.  No carotid bruit.  Normal pedal pulses.  Abdomen: Soft, nontender, no hepatosplenomegaly, no distention.  Skin: Intact without lesions or rashes.  Neurologic: Alert and oriented x 3.  Psych: Normal affect. Extremities: No clubbing or cyanosis.  HEENT: Normal.   ASSESSMENT & PLAN: 1. Chronic Systolic HF => recovered: Mixed ischemic/nonischemic CMP, suspect long-standing HTN played a large role. ECHO 10/16 EF 20-25% with mild LVH.  Repeat echo (1/17) showed EF improved back to normal range, 60-65%.  Echo in 11/21 showed that EF remained normal, 55-60%. She is euvolemic today, NYHA class I-II.   - Continue current Coreg and spironolactone.  BMET today and Stein need every 3 months with spironolactone use.  2. CAD: LHC 08/27/15 with 20% proximal LAD, 40% mid LAD,  90% distal LAD, large high OM1 with 80% ostial stenosis, medically managed.  No chest pain.  - Continue ASA 81. - Continue Crestor, check lipids today.    3. CVA: MRI head 08/28/15 showed small infarcts in watershed region. Suspect related to HTN, neurologic symptoms preceded cath.  No residual deficit. 4. H/o SVT with syncope: No symptomatic recurrence. Unable to reproduce with EP study.  - She is now off amiodarone.   5. HTN:  Renal artery dopplers 08/30/15 did not show RAS.  BP high in the office but home readings are well-controlled.  I Stein not change her meds.  - Continue Coreg and spironolactone.  - Continue losartan 25 mg daily.    Followup with me in 6 months, needs BMET q3 months.     Marca Ancona MD 07/11/2021

## 2021-07-11 NOTE — Patient Instructions (Signed)
EKG done today.  Labs done today. We will contact you only if your labs are abnormal.  No medication changes were made. Please continue all current medications as prescribed.  Your physician recommends that you schedule a follow-up appointment in: 3 months for a lab only appointment and in 6 months with Dr. Shirlee Latch. Please contact our office in January to schedule a February appointment.   If you have any questions or concerns before your next appointment please send [place] a message through [place] or call our office at [phone].    TO LEAVE A MESSAGE FOR THE NURSE SELECT OPTION 2, PLEASE LEAVE A MESSAGE INCLUDING: YOUR NAME DATE OF BIRTH CALL BACK NUMBER REASON FOR CALL**this is important as we prioritize the call backs  YOU WILL RECEIVE A CALL BACK THE SAME DAY AS LONG AS YOU CALL BEFORE 4:00 PM   Do the following things EVERYDAY: Weigh yourself in the morning before breakfast. Write it down and keep it in a log. Take your medicines as prescribed Eat low salt foods--Limit salt (sodium) to 2000 mg per day.  Stay as active as you can everyday Limit all fluids for the day to less than 2 liters   At the [facility], you and your health needs are our priority. As part of our continuing mission to provide you with exceptional heart care, we have created designated Provider Care Teams. These Care Teams include your primary Cardiologist (physician) and Advanced Practice Providers (APPs- Physician Assistants and Nurse Practitioners) who all work together to provide you with the care you need, when you need it.   You may see any of the following providers on your designated Care Team at your next follow up: Dr Arvilla Meres Dr Carron Curie, NP Robbie Lis, [place] Karle Plumber, PharmD   Please be sure to bring in all your medications bottles to every appointment. '

## 2021-07-19 ENCOUNTER — Telehealth (HOSPITAL_COMMUNITY): Payer: Self-pay | Admitting: Surgery

## 2021-07-19 ENCOUNTER — Other Ambulatory Visit (HOSPITAL_COMMUNITY): Payer: Self-pay

## 2021-07-19 DIAGNOSIS — R7989 Other specified abnormal findings of blood chemistry: Secondary | ICD-10-CM

## 2021-07-19 DIAGNOSIS — I5022 Chronic systolic (congestive) heart failure: Secondary | ICD-10-CM

## 2021-07-19 MED ORDER — ROSUVASTATIN CALCIUM 20 MG PO TABS
20.0000 mg | ORAL_TABLET | Freq: Every day | ORAL | 3 refills | Status: DC
  Filled 2021-07-19 – 2021-08-02 (×2): qty 30, 30d supply, fill #0
  Filled 2021-09-18: qty 30, 30d supply, fill #1
  Filled 2021-10-11: qty 30, 30d supply, fill #2
  Filled 2021-12-16: qty 30, 30d supply, fill #3

## 2021-07-19 NOTE — Telephone Encounter (Signed)
Labs added to previously scheduled appt

## 2021-07-19 NOTE — Telephone Encounter (Signed)
I called patient to review instructions per Dr. Shirlee Latch related to recent labwork.  I updated her prescription and sent to pharmacy of choice.  She acknowledges understanding.

## 2021-07-29 ENCOUNTER — Other Ambulatory Visit (HOSPITAL_COMMUNITY): Payer: Self-pay

## 2021-08-02 ENCOUNTER — Other Ambulatory Visit (HOSPITAL_COMMUNITY): Payer: Self-pay

## 2021-08-19 ENCOUNTER — Other Ambulatory Visit (HOSPITAL_COMMUNITY): Payer: Self-pay

## 2021-08-19 MED FILL — Carvedilol Tab 6.25 MG: ORAL | 90 days supply | Qty: 180 | Fill #1 | Status: CN

## 2021-08-19 MED FILL — Spironolactone Tab 25 MG: ORAL | 90 days supply | Qty: 90 | Fill #1 | Status: AC

## 2021-08-20 ENCOUNTER — Other Ambulatory Visit (HOSPITAL_COMMUNITY): Payer: Self-pay

## 2021-09-18 ENCOUNTER — Other Ambulatory Visit (HOSPITAL_COMMUNITY): Payer: Self-pay

## 2021-10-11 ENCOUNTER — Other Ambulatory Visit (HOSPITAL_COMMUNITY): Payer: Self-pay | Admitting: Cardiology

## 2021-10-11 ENCOUNTER — Other Ambulatory Visit (HOSPITAL_COMMUNITY): Payer: Self-pay

## 2021-10-14 ENCOUNTER — Ambulatory Visit (HOSPITAL_COMMUNITY)
Admission: RE | Admit: 2021-10-14 | Discharge: 2021-10-14 | Disposition: A | Discharge status: Home / Self Care | Payer: Self-pay | Source: Ambulatory Visit | Attending: Cardiology | Admitting: Cardiology

## 2021-10-14 ENCOUNTER — Telehealth (HOSPITAL_COMMUNITY): Payer: Self-pay

## 2021-10-14 ENCOUNTER — Other Ambulatory Visit (HOSPITAL_COMMUNITY): Payer: Self-pay

## 2021-10-14 ENCOUNTER — Other Ambulatory Visit (HOSPITAL_COMMUNITY): Payer: Self-pay | Admitting: *Deleted

## 2021-10-14 DIAGNOSIS — I5022 Chronic systolic (congestive) heart failure: Secondary | ICD-10-CM | POA: Insufficient documentation

## 2021-10-14 DIAGNOSIS — R7989 Other specified abnormal findings of blood chemistry: Secondary | ICD-10-CM | POA: Insufficient documentation

## 2021-10-14 LAB — BASIC METABOLIC PANEL
Anion gap: 7 (ref 5–15)
BUN: 13 mg/dL (ref 8–23)
CO2: 28 mmol/L (ref 22–32)
Calcium: 10.7 mg/dL — ABNORMAL HIGH (ref 8.9–10.3)
Chloride: 103 mmol/L (ref 98–111)
Creatinine, Ser: 1.65 mg/dL — ABNORMAL HIGH (ref 0.44–1.00)
GFR, Estimated: 30 mL/min — ABNORMAL LOW (ref >60)
Glucose, Bld: 99 mg/dL (ref 70–99)
Potassium: 3.8 mmol/L (ref 3.5–5.1)
Sodium: 138 mmol/L (ref 135–145)

## 2021-10-14 LAB — LIPID PANEL
Cholesterol: 156 mg/dL (ref 0–200)
HDL: 76 mg/dL (ref >40)
LDL Cholesterol: 68 mg/dL (ref 0–99)
Total CHOL/HDL Ratio: 2.1 RATIO
Triglycerides: 61 mg/dL (ref <150)
VLDL: 12 mg/dL (ref 0–40)

## 2021-10-14 MED ORDER — LOSARTAN POTASSIUM 25 MG PO TABS
25.0000 mg | ORAL_TABLET | Freq: Every day | ORAL | 3 refills | Status: DC
Start: 2021-10-14 — End: 2023-01-15
  Filled 2021-10-14: qty 90, 90d supply, fill #0
  Filled 2022-01-30: qty 90, 90d supply, fill #1
  Filled 2022-05-19: qty 90, 90d supply, fill #2
  Filled 2022-08-14: qty 90, 90d supply, fill #3

## 2021-10-14 MED ORDER — CARVEDILOL 6.25 MG PO TABS
6.2500 mg | ORAL_TABLET | Freq: Two times a day (BID) | ORAL | 3 refills | Status: DC
Start: 2021-10-14 — End: 2023-03-03
  Filled 2021-10-14: qty 180, 90d supply, fill #0
  Filled 2022-02-27: qty 180, 90d supply, fill #1
  Filled 2022-06-20: qty 180, 90d supply, fill #2

## 2021-10-14 NOTE — Telephone Encounter (Signed)
Patient is needing her losartan refilled.

## 2021-11-05 ENCOUNTER — Other Ambulatory Visit: Payer: Self-pay

## 2021-11-05 ENCOUNTER — Ambulatory Visit (HOSPITAL_COMMUNITY)
Admission: RE | Admit: 2021-11-05 | Discharge: 2021-11-05 | Disposition: A | Discharge status: Home / Self Care | Payer: Self-pay | Source: Ambulatory Visit | Attending: Internal Medicine | Admitting: Internal Medicine

## 2021-11-05 DIAGNOSIS — I5022 Chronic systolic (congestive) heart failure: Secondary | ICD-10-CM | POA: Insufficient documentation

## 2021-11-05 LAB — BASIC METABOLIC PANEL
Anion gap: 7 (ref 5–15)
BUN: 17 mg/dL (ref 8–23)
CO2: 26 mmol/L (ref 22–32)
Calcium: 10.8 mg/dL — ABNORMAL HIGH (ref 8.9–10.3)
Chloride: 104 mmol/L (ref 98–111)
Creatinine, Ser: 2.05 mg/dL — ABNORMAL HIGH (ref 0.44–1.00)
GFR, Estimated: 23 mL/min — ABNORMAL LOW (ref >60)
Glucose, Bld: 69 mg/dL — ABNORMAL LOW (ref 70–99)
Potassium: 3.3 mmol/L — ABNORMAL LOW (ref 3.5–5.1)
Sodium: 137 mmol/L (ref 135–145)

## 2021-11-07 ENCOUNTER — Other Ambulatory Visit (HOSPITAL_COMMUNITY): Payer: Self-pay | Admitting: *Deleted

## 2021-11-07 DIAGNOSIS — I5022 Chronic systolic (congestive) heart failure: Secondary | ICD-10-CM

## 2021-11-19 ENCOUNTER — Other Ambulatory Visit (HOSPITAL_COMMUNITY): Payer: Self-pay

## 2021-12-16 ENCOUNTER — Other Ambulatory Visit (HOSPITAL_COMMUNITY): Payer: Self-pay

## 2021-12-16 ENCOUNTER — Other Ambulatory Visit (HOSPITAL_COMMUNITY): Payer: Self-pay | Admitting: Cardiology

## 2021-12-16 MED ORDER — SPIRONOLACTONE 25 MG PO TABS
25.0000 mg | ORAL_TABLET | Freq: Every day | ORAL | 3 refills | Status: DC
  Filled 2021-12-16: qty 90, 90d supply, fill #0
  Filled 2022-03-19: qty 90, 90d supply, fill #1
  Filled 2022-06-20: qty 90, 90d supply, fill #2
  Filled 2022-09-30: qty 90, 90d supply, fill #3

## 2022-01-30 ENCOUNTER — Other Ambulatory Visit (HOSPITAL_COMMUNITY): Payer: Self-pay | Admitting: Cardiology

## 2022-01-30 ENCOUNTER — Other Ambulatory Visit (HOSPITAL_COMMUNITY): Payer: Self-pay

## 2022-01-30 MED ORDER — ROSUVASTATIN CALCIUM 20 MG PO TABS
20.0000 mg | ORAL_TABLET | Freq: Every day | ORAL | 3 refills | Status: DC
  Filled 2022-01-30: qty 30, 30d supply, fill #0
  Filled 2022-02-27: qty 30, 30d supply, fill #1

## 2022-02-27 ENCOUNTER — Other Ambulatory Visit (HOSPITAL_COMMUNITY): Payer: Self-pay

## 2022-02-28 ENCOUNTER — Other Ambulatory Visit (HOSPITAL_COMMUNITY): Payer: Self-pay

## 2022-03-19 ENCOUNTER — Other Ambulatory Visit (HOSPITAL_COMMUNITY): Payer: Self-pay

## 2022-04-02 ENCOUNTER — Other Ambulatory Visit (HOSPITAL_COMMUNITY): Payer: Self-pay | Admitting: Cardiology

## 2022-04-02 ENCOUNTER — Other Ambulatory Visit (HOSPITAL_COMMUNITY): Payer: Self-pay

## 2022-04-02 MED ORDER — ROSUVASTATIN CALCIUM 20 MG PO TABS
20.0000 mg | ORAL_TABLET | Freq: Every day | ORAL | 3 refills | Status: DC
  Filled 2022-04-02: qty 90, 90d supply, fill #0
  Filled 2022-08-14: qty 90, 90d supply, fill #1
  Filled 2023-01-15: qty 90, 90d supply, fill #2

## 2022-04-11 ENCOUNTER — Other Ambulatory Visit (HOSPITAL_COMMUNITY): Payer: Self-pay

## 2022-05-19 ENCOUNTER — Other Ambulatory Visit (HOSPITAL_COMMUNITY): Payer: Self-pay

## 2022-06-20 ENCOUNTER — Other Ambulatory Visit (HOSPITAL_COMMUNITY): Payer: Self-pay

## 2022-08-14 ENCOUNTER — Other Ambulatory Visit (HOSPITAL_COMMUNITY): Payer: Self-pay

## 2022-09-30 ENCOUNTER — Other Ambulatory Visit (HOSPITAL_COMMUNITY): Payer: Self-pay

## 2023-01-15 ENCOUNTER — Other Ambulatory Visit (HOSPITAL_COMMUNITY): Payer: Self-pay

## 2023-01-15 ENCOUNTER — Other Ambulatory Visit (HOSPITAL_COMMUNITY): Payer: Self-pay | Admitting: Internal Medicine

## 2023-01-16 ENCOUNTER — Other Ambulatory Visit (HOSPITAL_COMMUNITY): Payer: Self-pay

## 2023-01-16 MED ORDER — LOSARTAN POTASSIUM 25 MG PO TABS
25.0000 mg | ORAL_TABLET | Freq: Every day | ORAL | 0 refills | Status: DC
  Filled 2023-01-16: qty 90, 90d supply, fill #0

## 2023-02-18 ENCOUNTER — Telehealth (HOSPITAL_COMMUNITY): Payer: Self-pay | Admitting: Vascular Surgery

## 2023-02-18 NOTE — Telephone Encounter (Signed)
Pt called scheduling line stating had a nose bleed this morning. Pt wants to speak to [facility] nurse

## 2023-02-18 NOTE — Telephone Encounter (Signed)
Left detailed message on her voice mail. Advised to contact PCP about nosebleed.

## 2023-03-03 ENCOUNTER — Other Ambulatory Visit (HOSPITAL_COMMUNITY): Payer: Self-pay

## 2023-03-03 ENCOUNTER — Other Ambulatory Visit (HOSPITAL_COMMUNITY): Payer: Self-pay | Admitting: Internal Medicine

## 2023-03-03 ENCOUNTER — Other Ambulatory Visit (HOSPITAL_COMMUNITY): Payer: Self-pay | Admitting: Cardiology

## 2023-03-03 MED ORDER — CARVEDILOL 6.25 MG PO TABS
6.2500 mg | ORAL_TABLET | Freq: Two times a day (BID) | ORAL | 0 refills | Status: DC
  Filled 2023-03-03: qty 60, 30d supply, fill #0

## 2023-03-03 MED ORDER — SPIRONOLACTONE 25 MG PO TABS
25.0000 mg | ORAL_TABLET | Freq: Every day | ORAL | 0 refills | Status: DC
  Filled 2023-03-03: qty 30, 30d supply, fill #0

## 2023-03-09 ENCOUNTER — Other Ambulatory Visit (HOSPITAL_COMMUNITY): Payer: Self-pay

## 2023-03-19 ENCOUNTER — Encounter (HOSPITAL_COMMUNITY): Payer: Self-pay | Admitting: Cardiology

## 2023-03-19 ENCOUNTER — Ambulatory Visit (HOSPITAL_COMMUNITY)
Admission: RE | Admit: 2023-03-19 | Discharge: 2023-03-19 | Disposition: A | Discharge status: Home / Self Care | Payer: Self-pay | Source: Ambulatory Visit | Attending: Cardiology | Admitting: Cardiology

## 2023-03-19 ENCOUNTER — Other Ambulatory Visit (HOSPITAL_COMMUNITY): Payer: Self-pay

## 2023-03-19 VITALS — BP 160/80 | HR 77 | Wt 81.2 lb

## 2023-03-19 DIAGNOSIS — I110 Hypertensive heart disease with heart failure: Secondary | ICD-10-CM | POA: Insufficient documentation

## 2023-03-19 DIAGNOSIS — Z8679 Personal history of other diseases of the circulatory system: Secondary | ICD-10-CM | POA: Insufficient documentation

## 2023-03-19 DIAGNOSIS — Z79899 Other long term (current) drug therapy: Secondary | ICD-10-CM | POA: Insufficient documentation

## 2023-03-19 DIAGNOSIS — I2510 Atherosclerotic heart disease of native coronary artery without angina pectoris: Secondary | ICD-10-CM | POA: Insufficient documentation

## 2023-03-19 DIAGNOSIS — I5022 Chronic systolic (congestive) heart failure: Secondary | ICD-10-CM | POA: Insufficient documentation

## 2023-03-19 DIAGNOSIS — I255 Ischemic cardiomyopathy: Secondary | ICD-10-CM | POA: Insufficient documentation

## 2023-03-19 DIAGNOSIS — R55 Syncope and collapse: Secondary | ICD-10-CM | POA: Insufficient documentation

## 2023-03-19 DIAGNOSIS — I471 Supraventricular tachycardia, unspecified: Secondary | ICD-10-CM | POA: Insufficient documentation

## 2023-03-19 DIAGNOSIS — I428 Other cardiomyopathies: Secondary | ICD-10-CM | POA: Insufficient documentation

## 2023-03-19 LAB — BASIC METABOLIC PANEL
Anion gap: 7 (ref 5–15)
BUN: 21 mg/dL (ref 8–23)
CO2: 26 mmol/L (ref 22–32)
Calcium: 10.3 mg/dL (ref 8.9–10.3)
Chloride: 105 mmol/L (ref 98–111)
Creatinine, Ser: 1.25 mg/dL — ABNORMAL HIGH (ref 0.44–1.00)
GFR, Estimated: 42 mL/min — ABNORMAL LOW (ref >60)
Glucose, Bld: 102 mg/dL — ABNORMAL HIGH (ref 70–99)
Potassium: 3.6 mmol/L (ref 3.5–5.1)
Sodium: 138 mmol/L (ref 135–145)

## 2023-03-19 LAB — LIPID PANEL
Cholesterol: 177 mg/dL (ref 0–200)
HDL: 81 mg/dL (ref >40)
LDL Cholesterol: 86 mg/dL (ref 0–99)
Total CHOL/HDL Ratio: 2.2 RATIO
Triglycerides: 50 mg/dL (ref <150)
VLDL: 10 mg/dL (ref 0–40)

## 2023-03-19 LAB — CBC
HCT: 33.7 % — ABNORMAL LOW (ref 36.0–46.0)
Hemoglobin: 10.6 g/dL — ABNORMAL LOW (ref 12.0–15.0)
MCH: 24.1 pg — ABNORMAL LOW (ref 26.0–34.0)
MCHC: 31.5 g/dL (ref 30.0–36.0)
MCV: 76.6 fL — ABNORMAL LOW (ref 80.0–100.0)
Platelets: 141 10*3/uL — ABNORMAL LOW (ref 150–400)
RBC: 4.4 MIL/uL (ref 3.87–5.11)
RDW: 14.4 % (ref 11.5–15.5)
WBC: 5.5 10*3/uL (ref 4.0–10.5)
nRBC: 0 % (ref 0.0–0.2)

## 2023-03-19 MED ORDER — SPIRONOLACTONE 25 MG PO TABS
25.0000 mg | ORAL_TABLET | Freq: Every day | ORAL | 3 refills | Status: DC
  Filled 2023-03-19 – 2023-04-23 (×2): qty 90, 90d supply, fill #0
  Filled 2023-09-21: qty 90, 90d supply, fill #1
  Filled 2024-03-15: qty 90, 90d supply, fill #2

## 2023-03-19 MED ORDER — CARVEDILOL 6.25 MG PO TABS
6.2500 mg | ORAL_TABLET | Freq: Two times a day (BID) | ORAL | 7 refills | Status: DC
  Filled 2023-03-19 – 2023-05-26 (×2): qty 90, 45d supply, fill #0
  Filled 2023-11-09: qty 90, 45d supply, fill #1

## 2023-03-19 MED ORDER — LOSARTAN POTASSIUM 25 MG PO TABS
25.0000 mg | ORAL_TABLET | Freq: Every day | ORAL | 3 refills | Status: DC
  Filled 2023-03-19 – 2023-05-26 (×2): qty 90, 90d supply, fill #0
  Filled 2023-11-09: qty 90, 90d supply, fill #1

## 2023-03-19 MED ORDER — ASPIRIN 81 MG PO TBEC
81.0000 mg | DELAYED_RELEASE_TABLET | Freq: Every day | ORAL | 3 refills | Status: AC
  Filled 2023-03-19: qty 90, 90d supply, fill #0

## 2023-03-19 NOTE — Patient Instructions (Signed)
Lab Work:  Labs done today, your results will be available in MyChart, we will contact you for abnormal readings.  Follow-Up in:   Your physician recommends that you schedule a follow-up appointment in: 6 months with APP (November)    Do the following things EVERYDAY: Weigh yourself in the morning before breakfast. Write it down and keep it in a log. Take your medicines as prescribed Eat low salt foods--Limit salt (sodium) to 2000 mg per day.  Stay as active as you can everyday Limit all fluids for the day to less than 2 liters    Need to Contact us:  If you have any questions or concerns before your next appointment please send [place] a message through [place] or call our office at [phone].    TO LEAVE A MESSAGE FOR THE NURSE SELECT OPTION 2, PLEASE LEAVE A MESSAGE INCLUDING: YOUR NAME DATE OF BIRTH CALL BACK NUMBER REASON FOR CALL**this is important as we prioritize the call backs  YOU WILL RECEIVE A CALL BACK THE SAME DAY AS LONG AS YOU CALL BEFORE 4:00 PM   At the [facility], you and your health needs are our priority. As part of our continuing mission to provide you with exceptional heart care, we have created designated Provider Care Teams. These Care Teams include your primary Cardiologist (physician) and Advanced Practice Providers (APPs- Physician Assistants and Nurse Practitioners) who all work together to provide you with the care you need, when you need it.   You may see any of the following providers on your designated Care Team at your next follow up: Dr Arvilla Meres Dr Marca Ancona Dr. Marcos Eke, NP Robbie Lis, [place] [facility] [place], [place] Brynda Peon, NP Karle Plumber, PharmD   Please be sure to bring in all your medications bottles to every appointment.    Thank you for choosing [facility]

## 2023-03-19 NOTE — Progress Notes (Signed)
Patient ID: Julie Stein, female   DOB: 1934-11-29, 88 y.o.   MRN: [id number]   PCP: Dr. Darleene Cleaver Primary Cardiologist: Dr Shirlee Latch  HPI: Julie Stein is a 88 y.o.  female with a history of SVT, HTN, CAD, mixed ischemic/nonischemic cardiomyopathy, CHF and stroke.  She presents for followup of HTN, CAD, and CHF.  She was admitted initially 08/23/15 with pulmonary edema.  Echo on 08/26/15 revealed an EF of 20-25% with diffuse hypokinesis.  It was thought she had a viral myocarditis or hypertensive cardiomyopathy.  She developed transient weakness and was noted to have CVA by MRI.  She had R/LHC on 10/10 revealing normal to low filling pressures and normal cardiac output, moderate to severe ostial OM1 and severe distal LAD stenoses with plan for medical management. We thought her flash pulmonary edema and troponin elevation was likely from hypertensive crisis leading to demand ischemia.  During this time, she was noted to have runs of symptomatic SVT.      Once stable, she was admitted to IP rehab, but then readmitted to hospital with syncope while on bedside commode requiring  CPR.  She had recurrent SVT noted when back on telemetry.  Syncope thought to be 2/2 rapid SVT versus vasovagal.  She had an EP study but SVT could not be triggered so no ablation was done.  Given further recurrences, she was started on amiodarone.  Amiodarone was subsequently stopped.   Echo 1/17 showed EF improved to 60-65%.    She had a significant rise in LFTs in 5/17, apparently had had a "bad cold" around this time.  LFTs were normal by 7/17.  At last office appointment in 2/18, she was doing poorly.  She had lost considerable weight overall. She had been intermittently confused.  She had progressive anemia, followed by hematology who has been concerned for anemia of renal disease.  Profound fatigue, not very active.  She developed difficulty swallowing and pills had to be crushed.  She had to use a walker. ?Depression as cause of  symptoms.    Later in 2/18, she was admitted with syncope.  Workup was negative.  She was started on Keppra for ?subclinical seizures.  She was admitted again in 3/18 with syncope and was found to be orthostatic.  Echo in 2/18 showed EF 60-65%.   Echo in 11/21 showed EF 55-60%, normal RV.   She returns for followup of CHF and HTN today.  BP is high in the office but runs in the 120s systolic at home.  This is the typical pattern for her.  No exertional dyspnea or chest pain.  No lightheadedness or syncope.  No orthopnea/PND.  No palpitations.    ECG (personally reviewed): NSR, normal  Labs (10/16): K 3.7, creatinine 1.19, HCT 30.1 Labs (11/16): K 3.8, creatinine 1.35, LDL 76, HDL 68, TSH normal, LFTs normal Labs (1/17): K 4.3, creatinine 1.35, TSH normal, AST 32, ALT 30, HCT 32.4.  Labs (04/07/16):  AST 123 ALT 197. Bili normal  Labs (6/17): AST 62, ALT 61, K 3.1, creatinine 1.28 Labs (7/17): LFTs normal, K 3.4, creatinine 1.48, TSH normal Labs (8/17): K 4.1, creatinine 1.5 Labs (1/18): K 2.9, creatinine 0.7, hgb 9.4 Labs (3/18): K 4.4, creatinine 0.63 Labs (9/18): K 4.5, creatinine 0.94 Labs (3/20): K 4.1, creatinine 1.06, LDL 70, HDL 91 Labs (11/21): K 5.1, creatinine 1.21, LDL 79 Labs (11/22): LDL 68 Labs (12/22): K 3.3, creatinine 2.05  PMH 1. Chronic systolic CHF: Mixed ischemic/nonischemic CMP.  Suspect long-standing HTN  plays a major role.  Echo (10/16) with EF 20-25%, mild LVH, mild MR. Echo (1/17) with EF 60-65%, mild TR, severe LAE.  - Echo (2/18): EF 60-65%, mild LVH, normal RV size and systolic function.  - Echo (11/21): EF 55-60%, normal RV.  2. CAD:  LHC 10/16 with 20% proximal LAD, 40% mid LAD, 90% distal LAD, large high OM1 with 80% ostial stenosis => medically managed.  3. Hx of CVA: MRI head 08/28/15 showed small infarcts in watershed region. Pre-dated cardiac cath, likely related to HTN.  4. 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  Now on amiodarone given symptomatic frequent recurrences.   5. HTN:  Renal artery dopplers 08/30/15 did not show RAS.   6. Hyperlipidemia. 7. PFTs (11/16): suggestive of reactive airways disease like asthma.  8. Elevated LFTs: ?etiology.  9. Anemia 10. Depression  Current Outpatient Medications  Medication Sig Dispense Refill   Coenzyme Q-10 200 MG CAPS Take 200 mg by mouth 1 day or 1 dose.     Glucosamine-Chondroit-Vit C-Mn (GLUCOSAMINE CHONDR 1500 COMPLX) CAPS Take 2 capsules by mouth daily.     Misc Natural Products (LEG VEIN & CIRCULATION) TABS Take 1 capsule by mouth 1 day or 1 dose.     Multiple Vitamin (MULTIVITAMIN) capsule Take 1 capsule by mouth daily.     multivitamin-lutein (OCUVITE-LUTEIN) CAPS capsule Take 1 capsule by mouth daily. Zeaxanthin     omega-3 acid ethyl esters (LOVAZA) 1 g capsule Take by mouth daily.     rosuvastatin (CRESTOR) 20 MG tablet Take 1 tablet (20 mg total) by mouth daily. 90 tablet 3   aspirin EC 81 MG tablet Take 1 tablet (81 mg total) by mouth daily. 90 tablet 3   carvedilol (COREG) 6.25 MG tablet Take 1 tablet (6.25 mg total) by mouth 2 (two) times daily with a meal. **need office visit** 90 tablet 7   losartan (COZAAR) 25 MG tablet Take 1 tablet (25 mg total) by mouth daily. 90 tablet 3   spironolactone (ALDACTONE) 25 MG tablet Take 1 tablet (25 mg total) by mouth daily. **need office visit** 90 tablet 3   No current facility-administered medications for this encounter.    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   Right-handed.   No caffeine use.   Social Determinants of Health   Financial Resource Strain: Not on file  Food Insecurity: Not on file  Transportation Needs: Not on file  Physical Activity: Not on file  Stress: Not on file  Social Connections: Not on file  Intimate Partner Violence: Not on file      Family History  Problem Relation Age of Onset   Hypertension Daughter    Other  Mother        unsure of medical history-says she passed away from an accident   Other Father        unsure of medical history-says he passed away from natual causes    Vitals:   03/19/23 1107  BP: (!) 160/80  Pulse: 77  SpO2: 99%  Weight: 36.8 kg (81 lb 3.2 oz)    Wt Readings from Last 3 Encounters:  03/19/23 36.8 kg (81 lb 3.2 oz)  07/11/21 44.3 kg (97 lb 9.6 oz)  10/16/20 44.6 kg (98 lb 6.4 oz)    PHYSICAL EXAM: General: NAD, thin Neck: No JVD, no thyromegaly or thyroid nodule.  Lungs: Clear to auscultation bilaterally with normal respiratory effort. CV: Nondisplaced PMI.  Heart regular S1/S2, no S3/S4, no murmur.  No peripheral edema.  No carotid bruit.  Normal pedal pulses.  Abdomen: Soft, nontender, no hepatosplenomegaly, no distention.  Skin: Intact without lesions or rashes.  Neurologic: Alert and oriented x 3.  Psych: Normal affect. Extremities: No clubbing or cyanosis.  HEENT: Normal.    ASSESSMENT & PLAN: 1. Chronic Systolic HF => recovered: Mixed ischemic/nonischemic CMP, suspect long-standing HTN played a large role. ECHO 10/16 EF 20-25% with mild LVH.  Repeat echo (1/17) showed EF improved back to normal range, 60-65%.  Echo in 11/21 showed that EF remained normal, 55-60%. She is euvolemic today, NYHA class I.  - Continue current Coreg, losartan, and spironolactone.  BMET  today and will need every 3 months with spironolactone use.  2. CAD: LHC 08/27/15 with 20% proximal LAD, 40% mid LAD, 90% distal LAD, large high OM1 with 80% ostial stenosis, medically managed.  No chest pain.  - Continue ASA 81. - Continue Crestor, check lipids today.     3. CVA: MRI head 08/28/15 showed small infarcts in watershed region. Suspect related to HTN, neurologic symptoms preceded cath.  No residual deficit. 4. H/o SVT with syncope: No symptomatic recurrence. Unable to reproduce with EP study.  - She is now off amiodarone.   5. HTN:  Renal artery dopplers 08/30/15 did not show RAS.  BP high in the office but home readings are well-controlled.  I will not change her meds.  - Continue Coreg and spironolactone.  - Continue losartan 25 mg daily.    Followup with APP in 6 months, needs BMET q3 months.     Marca Ancona MD 03/19/2023

## 2023-04-23 ENCOUNTER — Other Ambulatory Visit (HOSPITAL_COMMUNITY): Payer: Self-pay

## 2023-04-24 ENCOUNTER — Other Ambulatory Visit (HOSPITAL_COMMUNITY): Payer: Self-pay

## 2023-05-26 ENCOUNTER — Other Ambulatory Visit (HOSPITAL_COMMUNITY): Payer: Self-pay | Admitting: Cardiology

## 2023-05-26 ENCOUNTER — Other Ambulatory Visit (HOSPITAL_COMMUNITY): Payer: Self-pay

## 2023-05-27 ENCOUNTER — Other Ambulatory Visit: Payer: Self-pay

## 2023-05-27 ENCOUNTER — Other Ambulatory Visit (HOSPITAL_COMMUNITY): Payer: Self-pay

## 2023-05-27 MED ORDER — ROSUVASTATIN CALCIUM 20 MG PO TABS
20.0000 mg | ORAL_TABLET | Freq: Every day | ORAL | 3 refills | Status: DC
  Filled 2023-05-27: qty 90, 90d supply, fill #0
  Filled 2023-11-09: qty 90, 90d supply, fill #1
  Filled 2024-04-27: qty 90, 90d supply, fill #2

## 2023-05-28 ENCOUNTER — Other Ambulatory Visit (HOSPITAL_COMMUNITY): Payer: Self-pay

## 2023-06-18 ENCOUNTER — Ambulatory Visit (HOSPITAL_COMMUNITY)
Admission: RE | Admit: 2023-06-18 | Discharge: 2023-06-18 | Disposition: A | Discharge status: Home / Self Care | Payer: Self-pay | Source: Ambulatory Visit | Attending: Cardiology | Admitting: Cardiology

## 2023-06-18 ENCOUNTER — Telehealth (HOSPITAL_COMMUNITY): Payer: Self-pay

## 2023-06-18 DIAGNOSIS — I5022 Chronic systolic (congestive) heart failure: Secondary | ICD-10-CM

## 2023-06-18 LAB — BASIC METABOLIC PANEL
Anion gap: 11 (ref 5–15)
BUN: 13 mg/dL (ref 8–23)
CO2: 23 mmol/L (ref 22–32)
Calcium: 10.1 mg/dL (ref 8.9–10.3)
Chloride: 102 mmol/L (ref 98–111)
Creatinine, Ser: 1.61 mg/dL — ABNORMAL HIGH (ref 0.44–1.00)
GFR, Estimated: 31 mL/min — ABNORMAL LOW (ref >60)
Glucose, Bld: 96 mg/dL (ref 70–99)
Potassium: 4.2 mmol/L (ref 3.5–5.1)
Sodium: 136 mmol/L (ref 135–145)

## 2023-06-18 NOTE — Telephone Encounter (Signed)
-----   Message from [employer] sent at 06/18/2023 10:26 AM EDT ----- Creatinine  higher, increase po fluid intake and repeat BMET 2 wks.

## 2023-06-18 NOTE — Telephone Encounter (Signed)
Patient's lab order has been placed and appointment scheduled. Pt aware, agreeable, and verbalized understanding.

## 2023-07-02 ENCOUNTER — Telehealth (HOSPITAL_COMMUNITY): Payer: Self-pay

## 2023-07-02 ENCOUNTER — Ambulatory Visit (HOSPITAL_COMMUNITY)
Admission: RE | Admit: 2023-07-02 | Discharge: 2023-07-02 | Disposition: A | Discharge status: Home / Self Care | Payer: Self-pay | Source: Ambulatory Visit | Attending: Cardiology | Admitting: Cardiology

## 2023-07-02 DIAGNOSIS — I5022 Chronic systolic (congestive) heart failure: Secondary | ICD-10-CM | POA: Insufficient documentation

## 2023-07-02 LAB — BASIC METABOLIC PANEL
Anion gap: 7 (ref 5–15)
BUN: 10 mg/dL (ref 8–23)
CO2: 25 mmol/L (ref 22–32)
Calcium: 9.9 mg/dL (ref 8.9–10.3)
Chloride: 109 mmol/L (ref 98–111)
Creatinine, Ser: 1.45 mg/dL — ABNORMAL HIGH (ref 0.44–1.00)
GFR, Estimated: 35 mL/min — ABNORMAL LOW (ref >60)
Glucose, Bld: 94 mg/dL (ref 70–99)
Potassium: 3.3 mmol/L — ABNORMAL LOW (ref 3.5–5.1)
Sodium: 141 mmol/L (ref 135–145)

## 2023-07-02 NOTE — Telephone Encounter (Addendum)
Pt aware, agreeable, and verbalized understanding  ----- Message from Marca Ancona sent at 07/02/2023  1:07 PM EDT ----- Increase K in diet.

## 2023-09-18 NOTE — Progress Notes (Signed)
Patient ID: Julie Stein, female   DOB: Feb 26, 1935, 88 y.o.   MRN: [id number]   PCP: Dr. Darleene Cleaver Primary Cardiologist: Dr Shirlee Latch  HPI: Julie Stein is a 88 y.o.  female with a history of SVT, HTN, CAD, mixed ischemic/nonischemic cardiomyopathy, CHF and stroke.  She presents for followup of HTN, CAD, and CHF.  She was admitted initially 08/23/15 with pulmonary edema.  Echo on 08/26/15 revealed an EF of 20-25% with diffuse hypokinesis.  It was thought she had a viral myocarditis or hypertensive cardiomyopathy.  She developed transient weakness and was noted to have CVA by MRI.  She had R/LHC on 10/10 revealing normal to low filling pressures and normal cardiac output, moderate to severe ostial OM1 and severe distal LAD stenoses with plan for medical management. We thought her flash pulmonary edema and troponin elevation was likely from hypertensive crisis leading to demand ischemia.  During this time, she was noted to have runs of symptomatic SVT.      Once stable, she was admitted to IP rehab, but then readmitted to hospital with syncope while on bedside commode requiring  CPR.  She had recurrent SVT noted when back on telemetry.  Syncope thought to be 2/2 rapid SVT versus vasovagal.  She had an EP study but SVT could not be triggered so no ablation was done.  Given further recurrences, she was started on amiodarone.  Amiodarone was subsequently stopped.   Echo 1/17 showed EF improved to 60-65%.    She had a significant rise in LFTs in 5/17, apparently had had a "bad cold" around this time.  LFTs were normal by 7/17.  Follow up 2/18, she was doing poorly.  She had lost considerable weight overall. She had been intermittently confused.  She had progressive anemia, followed by hematology who has been concerned for anemia of renal disease.  Profound fatigue, not very active.  She developed difficulty swallowing and pills had to be crushed.  She had to use a walker. ?Depression as cause of symptoms.    Later in  2/18, she was admitted with syncope.  Workup was negative.  She was started on Keppra for ?subclinical seizures.  She was admitted again in 3/18 with syncope and was found to be orthostatic.  Echo in 2/18 showed EF 60-65%.   Echo in 11/21 showed EF 55-60%, normal RV.   Today she returns for HF follow up with her daughter. Overall feeling fine. She is not SOB with activity, walks a lot. Daughter says she cannot sit still. She uses a cane when outside of her home. Denies palpitations, CP, dizziness, edema, or PND/Orthopnea. Appetite ok. No fever or chills. Weight at home 91 pounds. Taking all medications. BP at home 118/74 today. Requesting yearly follow up.   ECG (personally reviewed): patient declined ECG today.  Labs (10/16): K 3.7, creatinine 1.19, HCT 30.1 Labs (11/16): K 3.8, creatinine 1.35, LDL 76, HDL 68, TSH normal, LFTs normal Labs (1/17): K 4.3, creatinine 1.35, TSH normal, AST 32, ALT 30, HCT 32.4.  Labs (04/07/16):  AST 123 ALT 197. Bili normal  Labs (6/17): AST 62, ALT 61, K 3.1, creatinine 1.28 Labs (7/17): LFTs normal, K 3.4, creatinine 1.48, TSH normal Labs (8/17): K 4.1, creatinine 1.5 Labs (1/18): K 2.9, creatinine 0.7, hgb 9.4 Labs (3/18): K 4.4, creatinine 0.63 Labs (9/18): K 4.5, creatinine 0.94 Labs (3/20): K 4.1, creatinine 1.06, LDL 70, HDL 91 Labs (11/21): K 5.1, creatinine 1.21, LDL 79 Labs (11/22): LDL 68 Labs (12/22): K 3.3,  creatinine 2.05 Labs (5/24): LDL 86 Labs (8/24): K 3.3, creatinine 1.45  PMH 1. Chronic systolic CHF: Mixed ischemic/nonischemic CMP.  Suspect long-standing HTN plays a major role.  Echo (10/16) with EF 20-25%, mild LVH, mild MR. Echo (1/17) with EF 60-65%, mild TR, severe LAE.  - Echo (2/18): EF 60-65%, mild LVH, normal RV size and systolic function.  - Echo (11/21): EF 55-60%, normal RV.  2. CAD:  LHC 10/16 with 20% proximal LAD, 40% mid LAD, 90% distal LAD, large high OM1 with 80% ostial stenosis => medically managed.  3. Hx of CVA: MRI  head 08/28/15 showed small infarcts in watershed region. Pre-dated cardiac cath, likely related to HTN.  4. 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  Now on amiodarone given symptomatic frequent recurrences.   5. HTN:  Renal artery dopplers 08/30/15 did not show RAS.   6. Hyperlipidemia. 7. PFTs (11/16): suggestive of reactive airways disease like asthma.  8. Elevated LFTs: ?etiology.  9. Anemia 10. Depression  Current Outpatient Medications  Medication Sig Dispense Refill   aspirin EC 81 MG tablet Take 1 tablet (81 mg total) by mouth daily. 90 tablet 3   carvedilol (COREG) 6.25 MG tablet Take 1 tablet (6.25 mg total) by mouth 2 (two) times daily with a meal. **need office visit** 90 tablet 7   Coenzyme Q-10 200 MG CAPS Take 200 mg by mouth 1 day or 1 dose.     Glucosamine-Chondroit-Vit C-Mn (GLUCOSAMINE CHONDR 1500 COMPLX) CAPS Take 2 capsules by mouth daily.     losartan (COZAAR) 25 MG tablet Take 1 tablet (25 mg total) by mouth daily. 90 tablet 3   Misc Natural Products (LEG VEIN & CIRCULATION) TABS Take 1 capsule by mouth 1 day or 1 dose.     Multiple Vitamin (MULTIVITAMIN) capsule Take 1 capsule by mouth daily.     multivitamin-lutein (OCUVITE-LUTEIN) CAPS capsule Take 1 capsule by mouth daily. Zeaxanthin     omega-3 acid ethyl esters (LOVAZA) 1 g capsule Take 1 capsule by mouth daily.     rosuvastatin (CRESTOR) 20 MG tablet Take 1 tablet (20 mg total) by mouth daily. 90 tablet 3   spironolactone (ALDACTONE) 25 MG tablet Take 1 tablet (25 mg total) by mouth daily. **need office visit** 90 tablet 3   No current facility-administered medications for this encounter.    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   Right-handed.   No caffeine use.   Social Determinants of Health   Financial Resource Strain: Not on file  Food Insecurity: Not on file  Transportation Needs: Not on file  Physical Activity: Not on file  Stress: Not on file  Social Connections: Not on file  Intimate Partner Violence: Not on file   Family History  Problem Relation Age of Onset   Hypertension Daughter    Other Mother        unsure of medical history-says she passed away from an accident   Other Father        unsure of medical history-says he passed away from natual causes   BP (!) 186/84   Pulse 74   Wt 36.8 kg (81 lb 3.2 oz)   SpO2 99%   BMI 16.40 kg/m   Wt Readings from Last 3 Encounters:  09/21/23 36.8 kg (81 lb 3.2 oz)  03/19/23 36.8 kg (81 lb 3.2 oz)  07/11/21 44.3 kg (97 lb 9.6 oz)   PHYSICAL EXAM: General:  NAD. No resp difficulty, walked into clinic with cane, elderly HEENT: Normal Neck: Supple. No JVD. Carotids 2+ bilat; no bruits. No lymphadenopathy or thryomegaly appreciated. Cor: PMI nondisplaced. Regular rate & rhythm. No rubs, gallops or murmurs. Lungs: Clear Abdomen: Soft, nontender, nondistended. No hepatosplenomegaly. No bruits or masses. Good bowel sounds. Extremities: No cyanosis, clubbing, rash, edema Neuro: Alert & oriented x 3, cranial nerves grossly intact. Moves all 4 extremities w/o difficulty. Affect pleasant.  ASSESSMENT & PLAN: 1. Chronic Systolic HF => recovered: Mixed ischemic/nonischemic CMP, suspect long-standing HTN played a large role. ECHO 10/16 EF 20-25% with mild LVH.   Repeat echo (1/17) showed EF improved back to normal range, 60-65%.  Echo in 11/21 showed that EF remained normal, 55-60%. She is euvolemic today, NYHA class I.  - Continue current Coreg, losartan, and spironolactone.  BMET today and will need every 3 months with spironolactone use.  - Repeat echo at follow up in 1 year. 2. CAD: LHC 08/27/15 with 20% proximal LAD, 40% mid LAD, 90% distal LAD, large high OM1 with 80% ostial stenosis, medically managed.  No chest pain.  - Continue Julie 81. - Continue Crestor, 5/24 LDL 86 - Declines ECG today 3. CVA: MRI head 08/28/15 showed small infarcts in watershed region. Suspect related to HTN, neurologic symptoms preceded cath.  No residual deficit. 4. H/o SVT with syncope: No symptomatic recurrence. Unable to reproduce with EP study.  - She is now off amiodarone.   5. HTN:  Renal artery dopplers 08/30/15 did not show RAS.  BP high in the office but home readings are well-controlled.  I will not change her meds.  - Continue Coreg and spironolactone.  - Continue losartan 25 mg daily.    Follow up with in 1 year with Dr. Shirlee Latch + echo.  Anderson Malta [facility] FNP-BC 09/21/2023

## 2023-09-21 ENCOUNTER — Ambulatory Visit (HOSPITAL_COMMUNITY)
Admission: RE | Admit: 2023-09-21 | Discharge: 2023-09-21 | Disposition: A | Discharge status: Home / Self Care | Payer: Self-pay | Source: Ambulatory Visit | Attending: Family Medicine | Admitting: Family Medicine

## 2023-09-21 ENCOUNTER — Other Ambulatory Visit (HOSPITAL_COMMUNITY): Payer: Self-pay

## 2023-09-21 ENCOUNTER — Encounter (HOSPITAL_COMMUNITY): Payer: Self-pay

## 2023-09-21 VITALS — BP 186/84 | HR 74 | Wt 81.2 lb

## 2023-09-21 DIAGNOSIS — I5022 Chronic systolic (congestive) heart failure: Secondary | ICD-10-CM | POA: Insufficient documentation

## 2023-09-21 DIAGNOSIS — Z8673 Personal history of transient ischemic attack (TIA), and cerebral infarction without residual deficits: Secondary | ICD-10-CM | POA: Insufficient documentation

## 2023-09-21 DIAGNOSIS — I428 Other cardiomyopathies: Secondary | ICD-10-CM | POA: Insufficient documentation

## 2023-09-21 DIAGNOSIS — I255 Ischemic cardiomyopathy: Secondary | ICD-10-CM | POA: Insufficient documentation

## 2023-09-21 DIAGNOSIS — R531 Weakness: Secondary | ICD-10-CM | POA: Insufficient documentation

## 2023-09-21 DIAGNOSIS — I10 Essential (primary) hypertension: Secondary | ICD-10-CM

## 2023-09-21 DIAGNOSIS — Z7982 Long term (current) use of aspirin: Secondary | ICD-10-CM | POA: Insufficient documentation

## 2023-09-21 DIAGNOSIS — Z8679 Personal history of other diseases of the circulatory system: Secondary | ICD-10-CM | POA: Insufficient documentation

## 2023-09-21 DIAGNOSIS — R55 Syncope and collapse: Secondary | ICD-10-CM | POA: Insufficient documentation

## 2023-09-21 DIAGNOSIS — I110 Hypertensive heart disease with heart failure: Secondary | ICD-10-CM | POA: Insufficient documentation

## 2023-09-21 DIAGNOSIS — I2510 Atherosclerotic heart disease of native coronary artery without angina pectoris: Secondary | ICD-10-CM | POA: Insufficient documentation

## 2023-09-21 DIAGNOSIS — I639 Cerebral infarction, unspecified: Secondary | ICD-10-CM

## 2023-09-21 DIAGNOSIS — I471 Supraventricular tachycardia, unspecified: Secondary | ICD-10-CM | POA: Insufficient documentation

## 2023-09-21 DIAGNOSIS — Z79899 Other long term (current) drug therapy: Secondary | ICD-10-CM | POA: Insufficient documentation

## 2023-09-21 LAB — BASIC METABOLIC PANEL
Anion gap: 9 (ref 5–15)
BUN: 10 mg/dL (ref 8–23)
CO2: 26 mmol/L (ref 22–32)
Calcium: 11 mg/dL — ABNORMAL HIGH (ref 8.9–10.3)
Chloride: 107 mmol/L (ref 98–111)
Creatinine, Ser: 1.21 mg/dL — ABNORMAL HIGH (ref 0.44–1.00)
GFR, Estimated: 44 mL/min — ABNORMAL LOW (ref >60)
Glucose, Bld: 115 mg/dL — ABNORMAL HIGH (ref 70–99)
Potassium: 5 mmol/L (ref 3.5–5.1)
Sodium: 142 mmol/L (ref 135–145)

## 2023-09-21 NOTE — Patient Instructions (Signed)
Labs done today. We will contact you only if your labs are abnormal.  No medication changes were made. Please continue all current medications as prescribed.  Your physician recommends that you schedule a follow-up appointment in: 3 months,6 months and in 9 months for a lab only appointment and in 1 year for a echo and a appointment with Dr. Shirlee Latch. Please contact our office in September 2025 to schedule a November 2025 appointment.   Your physician has requested that you have an echocardiogram. Echocardiography is a painless test that uses sound waves to create images of your heart. It provides your doctor with information about the size and shape of your heart and how well your heart's chambers and valves are working. This procedure takes approximately one hour. There are no restrictions for this procedure. Please do NOT wear cologne, perfume, aftershave, or lotions (deodorant is allowed). Please arrive 15 minutes prior to your appointment time.  Please note: We ask at that you not bring children with you during ultrasound (echo/ vascular) testing. Due to room size and safety concerns, children are not allowed in the ultrasound rooms during exams. Our front office staff cannot provide observation of children in our lobby area while testing is being conducted. An adult accompanying a patient to their appointment will only be allowed in the ultrasound room at the discretion of the ultrasound technician under special circumstances. We apologize for any inconvenience.  If you have any questions or concerns before your next appointment please send [place] a message through [place] or call our office at [phone].    TO LEAVE A MESSAGE FOR THE NURSE SELECT OPTION 2, PLEASE LEAVE A MESSAGE INCLUDING: YOUR NAME DATE OF BIRTH CALL BACK NUMBER REASON FOR CALL**this is important as we prioritize the call backs  YOU WILL RECEIVE A CALL BACK THE SAME DAY AS LONG AS YOU CALL BEFORE 4:00 PM   Do the  following things EVERYDAY: Weigh yourself in the morning before breakfast. Write it down and keep it in a log. Take your medicines as prescribed Eat low salt foods--Limit salt (sodium) to 2000 mg per day.  Stay as active as you can everyday Limit all fluids for the day to less than 2 liters   At the [facility], you and your health needs are our priority. As part of our continuing mission to provide you with exceptional heart care, we have created designated Provider Care Teams. These Care Teams include your primary Cardiologist (physician) and Advanced Practice Providers (APPs- Physician Assistants and Nurse Practitioners) who all work together to provide you with the care you need, when you need it.   You may see any of the following providers on your designated Care Team at your next follow up: Dr Arvilla Meres Dr Marca Ancona Dr. Marcos Eke, NP Robbie Lis, [place] [facility] [place], [place] Brynda Peon, NP Karle Plumber, PharmD   Please be sure to bring in all your medications bottles to every appointment.    Thank you for choosing [facility]

## 2023-11-09 ENCOUNTER — Other Ambulatory Visit (HOSPITAL_COMMUNITY): Payer: Self-pay

## 2023-12-22 ENCOUNTER — Ambulatory Visit (HOSPITAL_COMMUNITY)
Admission: RE | Admit: 2023-12-22 | Discharge: 2023-12-22 | Disposition: A | Discharge status: Home / Self Care | Payer: Self-pay | Source: Ambulatory Visit | Attending: Internal Medicine | Admitting: Internal Medicine

## 2023-12-22 DIAGNOSIS — I5022 Chronic systolic (congestive) heart failure: Secondary | ICD-10-CM | POA: Insufficient documentation

## 2023-12-22 LAB — BASIC METABOLIC PANEL
Anion gap: 9 (ref 5–15)
BUN: 16 mg/dL (ref 8–23)
CO2: 27 mmol/L (ref 22–32)
Calcium: 10.2 mg/dL (ref 8.9–10.3)
Chloride: 107 mmol/L (ref 98–111)
Creatinine, Ser: 1.25 mg/dL — ABNORMAL HIGH (ref 0.44–1.00)
GFR, Estimated: 42 mL/min — ABNORMAL LOW (ref >60)
Glucose, Bld: 90 mg/dL (ref 70–99)
Potassium: 3.2 mmol/L — ABNORMAL LOW (ref 3.5–5.1)
Sodium: 143 mmol/L (ref 135–145)

## 2023-12-24 ENCOUNTER — Telehealth (HOSPITAL_COMMUNITY): Payer: Self-pay

## 2023-12-24 ENCOUNTER — Other Ambulatory Visit (HOSPITAL_COMMUNITY): Payer: Self-pay

## 2023-12-24 DIAGNOSIS — I5022 Chronic systolic (congestive) heart failure: Secondary | ICD-10-CM

## 2023-12-24 MED ORDER — POTASSIUM CHLORIDE CRYS ER 20 MEQ PO TBCR
40.0000 meq | EXTENDED_RELEASE_TABLET | ORAL | 0 refills | Status: AC
  Filled 2023-12-24: qty 4, 2d supply, fill #0

## 2023-12-24 NOTE — Telephone Encounter (Signed)
 Called patient and made her aware. Medication sent to pharmacy and labs ordered and scheduled.

## 2023-12-24 NOTE — Telephone Encounter (Signed)
-----   Message from Harlene CHRISTELLA Gainer sent at 12/23/2023 10:15 AM EST ----- Thanks andriette. Please have her take 40 KCL daily x 2 days, then need repeat BMET in 7-10 days please ----- Message ----- From: Tita Andriette NOVAK, RN Sent: 12/23/2023   9:49 AM EST To: Harlene CHRISTELLA Gainer, FNP  Spoke with patient regarding results- patient states that she is currently taking her spironolactone  25mg  daily and states that she has not missed any doses. Will forward to provider to make aware.  Jess, any other recommendations? Thank you!

## 2023-12-25 ENCOUNTER — Other Ambulatory Visit (HOSPITAL_COMMUNITY): Payer: Self-pay

## 2024-01-01 ENCOUNTER — Ambulatory Visit (HOSPITAL_COMMUNITY)
Admission: RE | Admit: 2024-01-01 | Discharge: 2024-01-01 | Disposition: A | Discharge status: Home / Self Care | Payer: Self-pay | Source: Ambulatory Visit | Attending: Cardiology | Admitting: Cardiology

## 2024-01-01 DIAGNOSIS — I5022 Chronic systolic (congestive) heart failure: Secondary | ICD-10-CM | POA: Insufficient documentation

## 2024-01-01 LAB — BASIC METABOLIC PANEL
Anion gap: 10 (ref 5–15)
BUN: 18 mg/dL (ref 8–23)
CO2: 24 mmol/L (ref 22–32)
Calcium: 10.1 mg/dL (ref 8.9–10.3)
Chloride: 102 mmol/L (ref 98–111)
Creatinine, Ser: 1.46 mg/dL — ABNORMAL HIGH (ref 0.44–1.00)
GFR, Estimated: 35 mL/min — ABNORMAL LOW (ref >60)
Glucose, Bld: 101 mg/dL — ABNORMAL HIGH (ref 70–99)
Potassium: 3.8 mmol/L (ref 3.5–5.1)
Sodium: 136 mmol/L (ref 135–145)

## 2024-03-15 ENCOUNTER — Other Ambulatory Visit (HOSPITAL_COMMUNITY): Payer: Self-pay

## 2024-03-21 ENCOUNTER — Ambulatory Visit (HOSPITAL_COMMUNITY)
Admission: RE | Admit: 2024-03-21 | Discharge: 2024-03-21 | Disposition: A | Discharge status: Home / Self Care | Payer: Self-pay | Source: Ambulatory Visit | Attending: Internal Medicine | Admitting: Internal Medicine

## 2024-03-21 DIAGNOSIS — I5022 Chronic systolic (congestive) heart failure: Secondary | ICD-10-CM | POA: Insufficient documentation

## 2024-03-21 LAB — BASIC METABOLIC PANEL WITH GFR
Anion gap: 6 (ref 5–15)
BUN: 14 mg/dL (ref 8–23)
CO2: 27 mmol/L (ref 22–32)
Calcium: 10.3 mg/dL (ref 8.9–10.3)
Chloride: 108 mmol/L (ref 98–111)
Creatinine, Ser: 1.17 mg/dL — ABNORMAL HIGH (ref 0.44–1.00)
GFR, Estimated: 45 mL/min — ABNORMAL LOW (ref >60)
Glucose, Bld: 101 mg/dL — ABNORMAL HIGH (ref 70–99)
Potassium: 3.7 mmol/L (ref 3.5–5.1)
Sodium: 141 mmol/L (ref 135–145)

## 2024-04-27 ENCOUNTER — Other Ambulatory Visit: Payer: Self-pay

## 2024-04-27 ENCOUNTER — Other Ambulatory Visit (HOSPITAL_COMMUNITY): Payer: Self-pay | Admitting: Cardiology

## 2024-04-27 ENCOUNTER — Other Ambulatory Visit (HOSPITAL_COMMUNITY): Payer: Self-pay

## 2024-04-29 ENCOUNTER — Other Ambulatory Visit: Payer: Self-pay

## 2024-04-29 MED ORDER — LOSARTAN POTASSIUM 25 MG PO TABS
25.0000 mg | ORAL_TABLET | Freq: Every day | ORAL | 3 refills | Status: AC
  Filled 2024-04-29: qty 90, 90d supply, fill #0
  Filled 2024-10-12: qty 90, 90d supply, fill #1

## 2024-04-29 MED ORDER — CARVEDILOL 6.25 MG PO TABS
6.2500 mg | ORAL_TABLET | Freq: Two times a day (BID) | ORAL | 7 refills | Status: AC
  Filled 2024-04-29: qty 90, 45d supply, fill #0
  Filled 2024-10-12: qty 90, 45d supply, fill #1

## 2024-06-09 ENCOUNTER — Other Ambulatory Visit (HOSPITAL_COMMUNITY): Payer: Self-pay

## 2024-06-09 ENCOUNTER — Other Ambulatory Visit (HOSPITAL_COMMUNITY): Payer: Self-pay | Admitting: Cardiology

## 2024-06-09 ENCOUNTER — Other Ambulatory Visit: Payer: Self-pay

## 2024-06-09 MED ORDER — SPIRONOLACTONE 25 MG PO TABS
25.0000 mg | ORAL_TABLET | Freq: Every day | ORAL | 1 refills | Status: AC
  Filled 2024-06-09: qty 30, 30d supply, fill #0
  Filled 2024-10-12: qty 30, 30d supply, fill #1

## 2024-06-20 ENCOUNTER — Ambulatory Visit (HOSPITAL_COMMUNITY)
Admission: RE | Admit: 2024-06-20 | Discharge: 2024-06-20 | Disposition: A | Discharge status: Home / Self Care | Payer: Self-pay | Source: Ambulatory Visit | Attending: Cardiology

## 2024-06-20 ENCOUNTER — Ambulatory Visit (HOSPITAL_COMMUNITY): Payer: Self-pay | Admitting: Family Medicine

## 2024-06-20 DIAGNOSIS — I5022 Chronic systolic (congestive) heart failure: Secondary | ICD-10-CM | POA: Insufficient documentation

## 2024-06-20 LAB — BASIC METABOLIC PANEL WITH GFR
Anion gap: 9 (ref 5–15)
BUN: 16 mg/dL (ref 8–23)
CO2: 26 mmol/L (ref 22–32)
Calcium: 10.7 mg/dL — ABNORMAL HIGH (ref 8.9–10.3)
Chloride: 108 mmol/L (ref 98–111)
Creatinine, Ser: 1.38 mg/dL — ABNORMAL HIGH (ref 0.44–1.00)
GFR, Estimated: 37 mL/min — ABNORMAL LOW (ref >60)
Glucose, Bld: 94 mg/dL (ref 70–99)
Potassium: 5.1 mmol/L (ref 3.5–5.1)
Sodium: 143 mmol/L (ref 135–145)

## 2024-10-05 ENCOUNTER — Other Ambulatory Visit (HOSPITAL_COMMUNITY): Payer: Self-pay

## 2024-10-05 ENCOUNTER — Ambulatory Visit (HOSPITAL_COMMUNITY)
Admission: RE | Admit: 2024-10-05 | Discharge: 2024-10-05 | Disposition: A | Discharge status: Home / Self Care | Payer: Self-pay | Source: Ambulatory Visit | Attending: Family Medicine | Admitting: Family Medicine

## 2024-10-05 DIAGNOSIS — I110 Hypertensive heart disease with heart failure: Secondary | ICD-10-CM | POA: Insufficient documentation

## 2024-10-05 DIAGNOSIS — I429 Cardiomyopathy, unspecified: Secondary | ICD-10-CM | POA: Insufficient documentation

## 2024-10-05 DIAGNOSIS — E785 Hyperlipidemia, unspecified: Secondary | ICD-10-CM | POA: Insufficient documentation

## 2024-10-05 DIAGNOSIS — I2510 Atherosclerotic heart disease of native coronary artery without angina pectoris: Secondary | ICD-10-CM | POA: Insufficient documentation

## 2024-10-05 DIAGNOSIS — I5022 Chronic systolic (congestive) heart failure: Secondary | ICD-10-CM | POA: Insufficient documentation

## 2024-10-05 LAB — ECHOCARDIOGRAM COMPLETE
AR max vel: 1.57 cm2
AV Area VTI: 1.44 cm2
AV Area mean vel: 1.61 cm2
AV Mean grad: 2 mmHg
AV Peak grad: 5.1 mmHg
Ao pk vel: 1.13 m/s
Area-P 1/2: 1.69 cm2
MV VTI: 1.01 cm2
S' Lateral: 1.8 cm

## 2024-10-12 ENCOUNTER — Other Ambulatory Visit (HOSPITAL_COMMUNITY): Payer: Self-pay | Admitting: Cardiology

## 2024-10-12 ENCOUNTER — Other Ambulatory Visit (HOSPITAL_COMMUNITY): Payer: Self-pay

## 2024-10-12 ENCOUNTER — Other Ambulatory Visit: Payer: Self-pay

## 2024-10-12 ENCOUNTER — Telehealth (HOSPITAL_COMMUNITY): Payer: Self-pay | Admitting: Cardiology

## 2024-10-12 MED ORDER — ROSUVASTATIN CALCIUM 20 MG PO TABS
20.0000 mg | ORAL_TABLET | Freq: Every day | ORAL | 0 refills | Status: AC
  Filled 2024-10-12: qty 90, 90d supply, fill #0
